# Patient Record
Sex: Female | Born: 1955 | Race: Black or African American | Hispanic: No | Smoking: Former smoker
Health system: Southern US, Community
[De-identification: ages and names within clinical notes are randomized; demographics above are authoritative.]

## PROBLEM LIST (undated history)

## (undated) DIAGNOSIS — I82409 Acute embolism and thrombosis of unspecified deep veins of unspecified lower extremity: Secondary | ICD-10-CM

## (undated) DIAGNOSIS — D509 Iron deficiency anemia, unspecified: Secondary | ICD-10-CM

## (undated) DIAGNOSIS — Z972 Presence of dental prosthetic device (complete) (partial): Secondary | ICD-10-CM

## (undated) DIAGNOSIS — R011 Cardiac murmur, unspecified: Secondary | ICD-10-CM

## (undated) DIAGNOSIS — M1990 Unspecified osteoarthritis, unspecified site: Secondary | ICD-10-CM

## (undated) DIAGNOSIS — K219 Gastro-esophageal reflux disease without esophagitis: Secondary | ICD-10-CM

## (undated) DIAGNOSIS — Q231 Congenital insufficiency of aortic valve: Secondary | ICD-10-CM

## (undated) DIAGNOSIS — Z9889 Other specified postprocedural states: Secondary | ICD-10-CM

## (undated) DIAGNOSIS — I350 Nonrheumatic aortic (valve) stenosis: Secondary | ICD-10-CM

## (undated) DIAGNOSIS — E079 Disorder of thyroid, unspecified: Secondary | ICD-10-CM

## (undated) DIAGNOSIS — I5033 Acute on chronic diastolic (congestive) heart failure: Secondary | ICD-10-CM

## (undated) DIAGNOSIS — Z6841 Body Mass Index (BMI) 40.0 and over, adult: Secondary | ICD-10-CM

## (undated) DIAGNOSIS — G4700 Insomnia, unspecified: Secondary | ICD-10-CM

## (undated) DIAGNOSIS — R112 Nausea with vomiting, unspecified: Secondary | ICD-10-CM

## (undated) DIAGNOSIS — I10 Essential (primary) hypertension: Secondary | ICD-10-CM

## (undated) DIAGNOSIS — Z973 Presence of spectacles and contact lenses: Secondary | ICD-10-CM

## (undated) DIAGNOSIS — Z954 Presence of other heart-valve replacement: Secondary | ICD-10-CM

## (undated) HISTORY — DX: Nonrheumatic aortic (valve) stenosis: I35.0

## (undated) HISTORY — PX: OTHER SURGICAL HISTORY: SHX169

## (undated) HISTORY — PX: MULTIPLE TOOTH EXTRACTIONS: SHX2053

## (undated) HISTORY — PX: CARDIAC CATHETERIZATION: SHX172

## (undated) HISTORY — PX: ABDOMINAL SURGERY: SHX537

## (undated) HISTORY — PX: HERNIA REPAIR: SHX51

---

## 1998-09-19 ENCOUNTER — Emergency Department (HOSPITAL_COMMUNITY): Admission: EM | Admit: 1998-09-19 | Discharge: 1998-09-19 | Payer: Self-pay | Admitting: Emergency Medicine

## 1999-01-02 ENCOUNTER — Encounter: Payer: Self-pay | Admitting: Internal Medicine

## 1999-01-02 ENCOUNTER — Ambulatory Visit (HOSPITAL_COMMUNITY): Admission: RE | Admit: 1999-01-02 | Discharge: 1999-01-02 | Payer: Self-pay | Admitting: Internal Medicine

## 2000-02-22 ENCOUNTER — Inpatient Hospital Stay (HOSPITAL_COMMUNITY): Admission: EM | Admit: 2000-02-22 | Discharge: 2000-03-07 | Payer: Self-pay | Admitting: Emergency Medicine

## 2000-02-22 ENCOUNTER — Encounter: Payer: Self-pay | Admitting: Emergency Medicine

## 2000-02-22 ENCOUNTER — Encounter (HOSPITAL_BASED_OUTPATIENT_CLINIC_OR_DEPARTMENT_OTHER): Payer: Self-pay | Admitting: General Surgery

## 2000-02-22 ENCOUNTER — Encounter (INDEPENDENT_AMBULATORY_CARE_PROVIDER_SITE_OTHER): Payer: Self-pay | Admitting: Specialist

## 2000-02-25 ENCOUNTER — Encounter (HOSPITAL_BASED_OUTPATIENT_CLINIC_OR_DEPARTMENT_OTHER): Payer: Self-pay | Admitting: General Surgery

## 2000-02-28 ENCOUNTER — Encounter (HOSPITAL_BASED_OUTPATIENT_CLINIC_OR_DEPARTMENT_OTHER): Payer: Self-pay | Admitting: General Surgery

## 2000-03-03 ENCOUNTER — Encounter (HOSPITAL_BASED_OUTPATIENT_CLINIC_OR_DEPARTMENT_OTHER): Payer: Self-pay | Admitting: General Surgery

## 2000-03-10 ENCOUNTER — Encounter (HOSPITAL_BASED_OUTPATIENT_CLINIC_OR_DEPARTMENT_OTHER): Payer: Self-pay | Admitting: General Surgery

## 2000-03-10 ENCOUNTER — Inpatient Hospital Stay (HOSPITAL_COMMUNITY): Admission: EM | Admit: 2000-03-10 | Discharge: 2000-04-29 | Payer: Self-pay | Admitting: General Surgery

## 2000-03-13 ENCOUNTER — Encounter (HOSPITAL_BASED_OUTPATIENT_CLINIC_OR_DEPARTMENT_OTHER): Payer: Self-pay | Admitting: General Surgery

## 2000-03-23 ENCOUNTER — Encounter (HOSPITAL_BASED_OUTPATIENT_CLINIC_OR_DEPARTMENT_OTHER): Payer: Self-pay | Admitting: General Surgery

## 2000-03-31 ENCOUNTER — Encounter (HOSPITAL_BASED_OUTPATIENT_CLINIC_OR_DEPARTMENT_OTHER): Payer: Self-pay | Admitting: General Surgery

## 2000-04-01 ENCOUNTER — Encounter (HOSPITAL_BASED_OUTPATIENT_CLINIC_OR_DEPARTMENT_OTHER): Payer: Self-pay | Admitting: General Surgery

## 2000-04-19 ENCOUNTER — Encounter (HOSPITAL_BASED_OUTPATIENT_CLINIC_OR_DEPARTMENT_OTHER): Payer: Self-pay | Admitting: General Surgery

## 2000-04-21 ENCOUNTER — Encounter (HOSPITAL_BASED_OUTPATIENT_CLINIC_OR_DEPARTMENT_OTHER): Payer: Self-pay | Admitting: General Surgery

## 2000-05-13 ENCOUNTER — Encounter (HOSPITAL_BASED_OUTPATIENT_CLINIC_OR_DEPARTMENT_OTHER): Payer: Self-pay | Admitting: General Surgery

## 2000-05-13 ENCOUNTER — Ambulatory Visit (HOSPITAL_COMMUNITY): Admission: RE | Admit: 2000-05-13 | Discharge: 2000-05-13 | Payer: Self-pay | Admitting: General Surgery

## 2000-05-15 ENCOUNTER — Ambulatory Visit (HOSPITAL_COMMUNITY): Admission: RE | Admit: 2000-05-15 | Discharge: 2000-05-15 | Payer: Self-pay | Admitting: General Surgery

## 2000-05-15 ENCOUNTER — Encounter (HOSPITAL_BASED_OUTPATIENT_CLINIC_OR_DEPARTMENT_OTHER): Payer: Self-pay | Admitting: General Surgery

## 2000-05-22 ENCOUNTER — Inpatient Hospital Stay (HOSPITAL_COMMUNITY): Admission: EM | Admit: 2000-05-22 | Discharge: 2000-06-28 | Payer: Self-pay | Admitting: Emergency Medicine

## 2000-05-22 ENCOUNTER — Encounter (HOSPITAL_BASED_OUTPATIENT_CLINIC_OR_DEPARTMENT_OTHER): Payer: Self-pay | Admitting: General Surgery

## 2000-05-27 ENCOUNTER — Encounter (HOSPITAL_BASED_OUTPATIENT_CLINIC_OR_DEPARTMENT_OTHER): Payer: Self-pay | Admitting: General Surgery

## 2000-06-08 ENCOUNTER — Encounter (HOSPITAL_BASED_OUTPATIENT_CLINIC_OR_DEPARTMENT_OTHER): Payer: Self-pay | Admitting: General Surgery

## 2000-06-11 ENCOUNTER — Encounter (HOSPITAL_BASED_OUTPATIENT_CLINIC_OR_DEPARTMENT_OTHER): Payer: Self-pay | Admitting: General Surgery

## 2000-06-17 ENCOUNTER — Encounter (HOSPITAL_BASED_OUTPATIENT_CLINIC_OR_DEPARTMENT_OTHER): Payer: Self-pay | Admitting: General Surgery

## 2000-06-19 ENCOUNTER — Encounter (HOSPITAL_BASED_OUTPATIENT_CLINIC_OR_DEPARTMENT_OTHER): Payer: Self-pay | Admitting: General Surgery

## 2000-06-22 ENCOUNTER — Encounter (HOSPITAL_BASED_OUTPATIENT_CLINIC_OR_DEPARTMENT_OTHER): Payer: Self-pay | Admitting: General Surgery

## 2000-12-24 ENCOUNTER — Ambulatory Visit (HOSPITAL_COMMUNITY): Admission: RE | Admit: 2000-12-24 | Discharge: 2000-12-24 | Payer: Self-pay | Admitting: General Surgery

## 2000-12-24 ENCOUNTER — Encounter (HOSPITAL_BASED_OUTPATIENT_CLINIC_OR_DEPARTMENT_OTHER): Payer: Self-pay | Admitting: General Surgery

## 2000-12-25 ENCOUNTER — Encounter (HOSPITAL_BASED_OUTPATIENT_CLINIC_OR_DEPARTMENT_OTHER): Payer: Self-pay | Admitting: General Surgery

## 2000-12-25 ENCOUNTER — Ambulatory Visit (HOSPITAL_COMMUNITY): Admission: RE | Admit: 2000-12-25 | Discharge: 2000-12-25 | Payer: Self-pay | Admitting: General Surgery

## 2000-12-28 ENCOUNTER — Ambulatory Visit (HOSPITAL_COMMUNITY): Admission: RE | Admit: 2000-12-28 | Discharge: 2000-12-28 | Payer: Self-pay | Admitting: General Surgery

## 2000-12-28 ENCOUNTER — Encounter (HOSPITAL_BASED_OUTPATIENT_CLINIC_OR_DEPARTMENT_OTHER): Payer: Self-pay | Admitting: General Surgery

## 2000-12-29 ENCOUNTER — Encounter (HOSPITAL_BASED_OUTPATIENT_CLINIC_OR_DEPARTMENT_OTHER): Payer: Self-pay | Admitting: General Surgery

## 2000-12-29 ENCOUNTER — Encounter (INDEPENDENT_AMBULATORY_CARE_PROVIDER_SITE_OTHER): Payer: Self-pay | Admitting: *Deleted

## 2000-12-29 ENCOUNTER — Inpatient Hospital Stay (HOSPITAL_COMMUNITY): Admission: RE | Admit: 2000-12-29 | Discharge: 2001-01-10 | Payer: Self-pay | Admitting: General Surgery

## 2000-12-30 ENCOUNTER — Encounter (HOSPITAL_BASED_OUTPATIENT_CLINIC_OR_DEPARTMENT_OTHER): Payer: Self-pay | Admitting: General Surgery

## 2003-04-07 ENCOUNTER — Emergency Department (HOSPITAL_COMMUNITY): Admission: EM | Admit: 2003-04-07 | Discharge: 2003-04-08 | Payer: Self-pay | Admitting: Emergency Medicine

## 2008-11-11 ENCOUNTER — Emergency Department (HOSPITAL_COMMUNITY): Admission: EM | Admit: 2008-11-11 | Discharge: 2008-11-11 | Payer: Self-pay | Admitting: Emergency Medicine

## 2009-09-05 ENCOUNTER — Inpatient Hospital Stay (HOSPITAL_COMMUNITY): Admission: EM | Admit: 2009-09-05 | Discharge: 2009-09-21 | Payer: Self-pay | Admitting: Emergency Medicine

## 2009-09-05 ENCOUNTER — Ambulatory Visit: Payer: Self-pay | Admitting: Cardiology

## 2009-09-05 ENCOUNTER — Ambulatory Visit: Payer: Self-pay | Admitting: Family Medicine

## 2009-09-05 ENCOUNTER — Emergency Department (HOSPITAL_COMMUNITY): Admission: EM | Admit: 2009-09-05 | Discharge: 2009-09-05 | Payer: Self-pay | Admitting: Family Medicine

## 2009-09-06 ENCOUNTER — Encounter: Payer: Self-pay | Admitting: Family Medicine

## 2009-09-06 ENCOUNTER — Ambulatory Visit: Payer: Self-pay | Admitting: Surgery

## 2009-09-09 ENCOUNTER — Ambulatory Visit: Payer: Self-pay | Admitting: Infectious Diseases

## 2009-09-19 ENCOUNTER — Ambulatory Visit: Payer: Self-pay | Admitting: Physical Medicine & Rehabilitation

## 2009-09-21 ENCOUNTER — Inpatient Hospital Stay (HOSPITAL_COMMUNITY)
Admission: RE | Admit: 2009-09-21 | Discharge: 2009-09-28 | Payer: Self-pay | Admitting: Physical Medicine & Rehabilitation

## 2009-09-21 ENCOUNTER — Ambulatory Visit: Payer: Self-pay | Admitting: Physical Medicine & Rehabilitation

## 2009-09-24 DIAGNOSIS — I359 Nonrheumatic aortic valve disorder, unspecified: Secondary | ICD-10-CM

## 2009-09-24 DIAGNOSIS — L02419 Cutaneous abscess of limb, unspecified: Secondary | ICD-10-CM

## 2009-09-24 DIAGNOSIS — E039 Hypothyroidism, unspecified: Secondary | ICD-10-CM

## 2009-09-24 DIAGNOSIS — L03119 Cellulitis of unspecified part of limb: Secondary | ICD-10-CM

## 2009-09-24 DIAGNOSIS — I10 Essential (primary) hypertension: Secondary | ICD-10-CM | POA: Insufficient documentation

## 2009-09-24 DIAGNOSIS — I82503 Chronic embolism and thrombosis of unspecified deep veins of lower extremity, bilateral: Secondary | ICD-10-CM

## 2009-09-28 ENCOUNTER — Encounter (INDEPENDENT_AMBULATORY_CARE_PROVIDER_SITE_OTHER): Payer: Self-pay | Admitting: *Deleted

## 2009-09-30 ENCOUNTER — Encounter: Payer: Self-pay | Admitting: Infectious Diseases

## 2009-10-02 ENCOUNTER — Encounter (INDEPENDENT_AMBULATORY_CARE_PROVIDER_SITE_OTHER): Payer: Self-pay | Admitting: *Deleted

## 2009-10-09 ENCOUNTER — Ambulatory Visit: Payer: Self-pay | Admitting: Infectious Diseases

## 2009-10-09 LAB — CONVERTED CEMR LAB
Basophils Absolute: 0 10*3/uL (ref 0.0–0.1)
Basophils Relative: 0 % (ref 0–1)
CRP: 2 mg/dL — ABNORMAL HIGH (ref <0.6)
HCT: 38 % (ref 36.0–46.0)
Hemoglobin: 12.6 g/dL (ref 12.0–15.0)
MCV: 98.4 fL (ref >=78.0)
Monocytes Absolute: 0.7 10*3/uL (ref 0.1–1.0)
Monocytes Relative: 8 % (ref 3–12)

## 2009-10-15 ENCOUNTER — Encounter (HOSPITAL_BASED_OUTPATIENT_CLINIC_OR_DEPARTMENT_OTHER)
Admission: RE | Admit: 2009-10-15 | Discharge: 2010-01-13 | Payer: Self-pay | Source: Home / Self Care | Admitting: General Surgery

## 2009-10-15 ENCOUNTER — Telehealth: Payer: Self-pay | Admitting: Infectious Diseases

## 2009-10-23 ENCOUNTER — Ambulatory Visit: Payer: Self-pay | Admitting: Vascular Surgery

## 2010-06-22 ENCOUNTER — Ambulatory Visit: Payer: Self-pay | Admitting: Cardiovascular Disease

## 2010-06-22 ENCOUNTER — Inpatient Hospital Stay (HOSPITAL_COMMUNITY)
Admission: EM | Admit: 2010-06-22 | Discharge: 2010-06-24 | Payer: Self-pay | Source: Home / Self Care | Admitting: Emergency Medicine

## 2010-06-23 ENCOUNTER — Encounter: Payer: Self-pay | Admitting: Internal Medicine

## 2010-07-17 ENCOUNTER — Inpatient Hospital Stay (HOSPITAL_COMMUNITY)
Admission: EM | Admit: 2010-07-17 | Discharge: 2010-07-20 | Payer: Self-pay | Source: Home / Self Care | Attending: Internal Medicine | Admitting: Internal Medicine

## 2010-07-18 ENCOUNTER — Ambulatory Visit: Payer: Self-pay | Admitting: Cardiology

## 2010-08-25 ENCOUNTER — Observation Stay (HOSPITAL_COMMUNITY)
Admission: EM | Admit: 2010-08-25 | Discharge: 2010-08-25 | Payer: Self-pay | Source: Home / Self Care | Admitting: Emergency Medicine

## 2010-08-25 LAB — DIFFERENTIAL
Basophils Relative: 0 % (ref 0–1)
Eosinophils Absolute: 0.3 10*3/uL (ref 0.0–0.7)
Eosinophils Relative: 2 % (ref 0–5)
Lymphs Abs: 2.1 10*3/uL (ref 0.7–4.0)
Monocytes Relative: 5 % (ref 3–12)
Neutro Abs: 12.4 10*3/uL — ABNORMAL HIGH (ref 1.7–7.7)

## 2010-08-25 LAB — POCT I-STAT, CHEM 8
BUN: 15 mg/dL (ref 6–23)
Chloride: 106 mEq/L (ref 96–112)
Creatinine, Ser: 0.9 mg/dL (ref 0.4–1.2)
Glucose, Bld: 120 mg/dL — ABNORMAL HIGH (ref 70–99)
HCT: 50 % — ABNORMAL HIGH (ref 36.0–46.0)
Potassium: 4.3 mEq/L (ref 3.5–5.1)
Sodium: 141 mEq/L (ref 135–145)

## 2010-08-25 LAB — CBC
Hemoglobin: 15.3 g/dL — ABNORMAL HIGH (ref 12.0–15.0)
WBC: 15.7 10*3/uL — ABNORMAL HIGH (ref 4.0–10.5)

## 2010-08-27 NOTE — Progress Notes (Signed)
Summary: patient unable to buy rx/TY  Phone Note From Pharmacy   Caller: [facility] Summary of Call: Patient could not afford the medication that was rx'ed for her wound she continued hydrogel  and has an appt. tomorrow at the wound care center. Initial call taken by: Starleen Arms CMA,  October 15, 2009 4:30 PM

## 2010-08-27 NOTE — Miscellaneous (Signed)
Summary: HIPPA RESTRICTION  HIPPA RESTRICTION   Imported By: Florinda Marker 10/09/2009 14:11:45  _____________________________________________________________________  External Attachment:    Type:   Image     Comment:   External Document

## 2010-08-27 NOTE — Miscellaneous (Signed)
Summary: med upddate  Clinical Lists Changes  Medications: Removed medication of VANCOCIN HCL 125 MG CAPS (VANCOMYCIN HCL) Added new medication of CLEOCIN 150 MG CAPS (CLINDAMYCIN HCL) 3 caps four times daily until March 15 Added new medication of POTASSIUM CHLORIDE CRYS CR 20 MEQ CR-TABS (POTASSIUM CHLORIDE CRYS CR) 1 tab once daily Added new medication of OXYCODONE HCL 5 MG TABS (OXYCODONE HCL) 1-2 tabs every 4 hours as needed

## 2010-08-27 NOTE — Assessment & Plan Note (Signed)
Summary: hsfu need chart cellulitis/kam   CC:  hsfu.  History of Present Illness: 55 yo AAF I saw in hospital with severe cellulitis and development of chronic venous stasis ulcer  d.ced from rehab (09/28/2009) and is getting PT at home.   Had I and D at bedside by surgery prior to d/c. Following with wound care next week on the 23rd.   Currently applygin hydrogel to leg - per pt the wound looks a little better- no drainage.  Changing the dressing once a day  Tolerating clindaymycin at this point but using liquid form as pill form felt like it was sticking. No feves chills NS  Jovita Gamma - PCP  Surgery follow up - none wound care  March 23rd - Dr Lurene Shadow.  Consult note in rehab  very pleasant 55 year old female with a   history of hypothyroidism, hypertension, prior DVT who was admitted on   September 05, 2009, with fevers, chills, and swelling in her legs.  She   was found to have an impressive marked cellulitis of her right lower   extremity.  She has been hospitalized since then and on the 25th was   transferred to acute rehab.  For her cellulitis, she has been treated   since admission with vancomycin and it is now day 20 of vancomycin.  She   at other points has also been on clindamycin at times when the wound   would worsen.  She received approximately 10 days of clindamycin with   the last dose being September 17, 2009.  She has also been followed by   Cardiology and is found to have severe aortic stenosis.  Her wound has   been very slow to heal, but has been making progress with evaluation   continued care by Wound Care as well as evaluation by Surgery.  She has   had workup including CT of her lower extremity done both on the 10th and   the 18th.  There has been no deep abscess to drain; however, she did   have blistering of her skin.  Her wound has slowly started to improve   and we are re-consulted for question of whether to change her to oral   antibiotics.      Of  note, the patient has had blood cultures done on the 9th that were   negative as well as a wound culture on the 13th that was negative.   Preventive Screening-Counseling & Management  Alcohol-Tobacco     Alcohol drinks/day: 0     Smoking Status: never  Caffeine-Diet-Exercise     Caffeine use/day: coffee     Does Patient Exercise: yes     Type of exercise: Rehab  Safety-Violence-Falls     Seat Belt Use: yes   Prior Medication List:  PERCOCET 5-325 MG TABS (OXYCODONE-ACETAMINOPHEN) 1-2 tabs q4 hour as needed IBUPROFEN 600 MG TABS (IBUPROFEN) 1 tab as needed PROTONIX 40 MG TBEC (PANTOPRAZOLE SODIUM) 1 tab once daily FUROSEMIDE 20 MG TABS (FUROSEMIDE) 1 tab two times a day LOTENSIN 20 MG TABS (BENAZEPRIL HCL) 1 tab once daily COLACE 100 MG CAPS (DOCUSATE SODIUM) 1 cap two times a day SYNTHROID 175 MCG TABS (LEVOTHYROXINE SODIUM) 1 tab once daily COUMADIN 5 MG TABS (WARFARIN SODIUM) take as directed AMBIEN 10 MG TABS (ZOLPIDEM TARTRATE) 1 tab at bedtime DULCOLAX 5 MG TBEC (BISACODYL) as needed DILAUDID 1 MG/ML SOLN (HYDROMORPHONE HCL) 1mg  q4hour as needed MIRALAX  POWD (POLYETHYLENE GLYCOL 3350) as needed SENNA 8.6  MG TABS (SENNOSIDES) 2 tabs once daily as needed CLEOCIN 150 MG CAPS (CLINDAMYCIN HCL) 3 caps four times daily until March 15 POTASSIUM CHLORIDE CRYS CR 20 MEQ CR-TABS (POTASSIUM CHLORIDE CRYS CR) 1 tab once daily OXYCODONE HCL 5 MG TABS (OXYCODONE HCL) 1-2 tabs every 4 hours as needed   Current Allergies (reviewed today): No known allergies  Past History:  Past Medical History: Last updated: 09/24/2009 COUMADIN THERAPY (ICD-V58.61) AORTIC STENOSIS, SEVERE (ICD-424.1) HYPERTENSION (ICD-401.9) DEEP VENOUS THROMBOPHLEBITIS, LEG, LEFT (ICD-453.40) CELLULITIS, LEG, RIGHT (ICD-682.6) HYPOTHYROIDISM (ICD-244.9)  Past Surgical History: Last updated: 09/24/2009  1. Hernia repair, ventral x2.   2. Colocutaneous fistula as a complication repaired.      Family  History: Last updated: 09/24/2009  Positive CAD.   Social History: Last updated: 09/24/2009  Lives with husband and son.  The patient on disability.   Husband and local family works, one-level home, two steps to enter.      Risk Factors: Alcohol Use: 0 (10/09/2009) Caffeine Use: coffee (10/09/2009) Exercise: yes (10/09/2009)  Risk Factors: Smoking Status: never (10/09/2009)  Review of Systems       11 systems reviewed and negative except per HPI   Vital Signs:  Patient profile:   55 year old female Temp:     97.6 degrees F oral CC: hsfu Pain Assessment Patient in pain? yes     Location: right leg Intensity: 5 Type: throbbing Onset of pain  Constant Nutritional Status Detail appetite is getting better  Does patient need assistance? Functional Status Self care Ambulation Wheelchair   Physical Exam  General:  alert and well-developed.  obese Head:  normocephalic.   Mouth:  fair dentition.   Neck:  supple.   Lungs:  normal respiratory effort and normal breath sounds.   Heart:  normal rate and regular rhythm.   Abdomen:  soft and non-tender.   Msk:  Large amt of woody non pitting edema over R leg.   Skin:  rle with 6 cm x 4 cm very irregular wound about 1 cm deep.   the base has about 70% yellow tissue and about 30 % red tissue. The surrounding skin is raised and plaque like. chronic venous changes ALso LLE with chronic venous changes Psych:  Oriented X3, memory intact for recent and remote, and normally interactive.     Impression & Recommendations:  Problem # 1:  CELLULITIS, LEG, RIGHT (ICD-682.6) 55 yo with severe le cellulitis and chronic venous stasis ulcer seen in f/u.  On clindamycin since d/c.   Chronic venous stasis ulcer after a severe case of cellulits.  Leg seems stable and I doubt active infection but the stasis ulcer needs more aggressive woudn care.  Currently using hydrogel on wound.  At this point I Think we should do santyl or accuzyme WIll  call advanced to do that.  WIll stop abx at this point and advised re need to monitor. She will follow up with wound care next week.  Her updated medication list for this problem includes:    Percocet 5-325 Mg Tabs (Oxycodone-acetaminophen) .Marland Kitchen... 1-2 tabs q4 hour as needed    Dilaudid 1 Mg/ml Soln (Hydromorphone hcl) ..... 1mg  q4hour as needed    Cleocin 150 Mg Caps (Clindamycin hcl) .Marland KitchenMarland KitchenMarland KitchenMarland Kitchen 3 caps four times daily until march 15    Oxycodone Hcl 5 Mg Tabs (Oxycodone hcl) .Marland Kitchen... 1-2 tabs every 4 hours as needed    Roxicodone 5 Mg Tabs (Oxycodone hcl) ..... One by mouth q 6 hours as needed  for pain.  Orders: Est. Patient Level IV (16109) T-C-Reactive Protein [phone]) T-CBC w/Diff [phone]) T-Sed Rate (Automated) ([id number]) T-Protime, Auto ([id number])  Problem # 2:  AORTIC STENOSIS, SEVERE (ICD-424.1) follows with cards Her updated medication list for this problem includes:    Coumadin 5 Mg Tabs (Warfarin sodium) .Marland Kitchen... Take as directed  Medications Added to Medication List This Visit: 1)  Roxicodone 5 Mg Tabs (Oxycodone hcl) .... One by mouth q 6 hours as needed for pain. 2)  Santyl 250 Unit/gm Oint (Collagenase) .... Apply to wound two times a day  Patient Instructions: 1)  Please schedule a follow-up appointment in 1 month. 2)  Follow up with Dr Lurene Shadow in wound care. 3)  We will change the wound dressing from hydrogel to santyl two times a day. 4)  Stop the antibiotics and monitor for worsening pain, redness or draiange from the wound or fevers chills or other infectious symptoms. Prescriptions: SANTYL 250 UNIT/GM OINT (COLLAGENASE) apply to wound two times a day  #1 x 6   Entered and Authorized by:   Clydie Braun MD   Signed by:   Clydie Braun MD on 10/11/2009   Method used:   Telephoned to ...         RxID:   [id number] ROXICODONE 5 MG TABS (OXYCODONE HCL) one by mouth q 6 hours as needed for pain.  #60 x 0   Entered and Authorized by:   Clydie Braun MD   Signed by:   Clydie Braun MD on 10/09/2009   Method used:   Print then Give to Patient   RxID:   [id number]  Process Orders Check Orders Results:     [facility]: ABN not required for this insurance Tests Sent for requisitioning (October 11, 2009 4:36 PM):     10/09/2009: [facility] -- T-C-Reactive Protein [[id number]] (signed)     10/09/2009: [facility] -- T-CBC w/Diff [[id number]] (signed)     10/09/2009: [facility] -- T-Sed Rate (Automated) [phone] (signed)     10/09/2009: [facility] -- T-Protime, Auto [[id number]] (signed)  Santyl phoned to [facility] @ [phone] [facility] RN  October 11, 2009 9:43 AM

## 2010-08-27 NOTE — Letter (Signed)
Summary: Appointment - Missed  [facility]     [place], [place]    Phone:   Fax:      October 02, 2009 MRN: [id number]   Christine Hicks [address] [place], [place]  [address]   Dear Christine Hicks,  Our records indicate you missed your appointment on 09-28-2009   with  Dr. Daleen Squibb    It is very important that we reach you to reschedule this appointment. We look forward to participating in your health care needs. Please contact us at the number listed above at your earliest convenience to reschedule this appointment.     Sincerely,   Lorne Skeens  [facility] Scheduling Team

## 2010-08-27 NOTE — Letter (Signed)
Summary: PHYSICIAN ORDERS  PHYSICIAN ORDERS   Imported By: Margie Billet 10/12/2009 15:24:10  _____________________________________________________________________  External Attachment:    Type:   Image     Comment:   External Document

## 2010-10-07 LAB — CBC
HCT: 46.9 % — ABNORMAL HIGH (ref 36.0–46.0)
Hemoglobin: 14.2 g/dL (ref 12.0–15.0)
Hemoglobin: 14.6 g/dL (ref 12.0–15.0)
Hemoglobin: 15 g/dL (ref 12.0–15.0)
Hemoglobin: 15.6 g/dL — ABNORMAL HIGH (ref 12.0–15.0)
MCH: 31.4 pg (ref 26.0–34.0)
MCH: 31.5 pg (ref 26.0–34.0)
MCHC: 32.8 g/dL (ref 30.0–36.0)
MCV: 94.7 fL (ref 78.0–100.0)
MCV: 95.3 fL (ref 78.0–100.0)
MCV: 95.8 fL (ref 78.0–100.0)
MCV: 96 fL (ref 78.0–100.0)
Platelets: 163 10*3/uL (ref 150–400)
Platelets: 181 10*3/uL (ref 150–400)
Platelets: ADEQUATE 10*3/uL (ref 150–400)
RBC: 4.51 MIL/uL (ref 3.87–5.11)
RBC: 4.73 MIL/uL (ref 3.87–5.11)
RDW: 14.2 % (ref 11.5–15.5)
RDW: 14.3 % (ref 11.5–15.5)
RDW: 14.5 % (ref 11.5–15.5)

## 2010-10-07 LAB — CULTURE, BLOOD (ROUTINE X 2)
Culture  Setup Time: 201112220527
Culture: NO GROWTH

## 2010-10-07 LAB — COMPREHENSIVE METABOLIC PANEL
ALT: 20 U/L (ref 0–35)
AST: 26 U/L (ref 0–37)
Albumin: 3.2 g/dL — ABNORMAL LOW (ref 3.5–5.2)
Alkaline Phosphatase: 63 U/L (ref 39–117)
Calcium: 8.9 mg/dL (ref 8.4–10.5)
Chloride: 108 mEq/L (ref 96–112)
GFR calc non Af Amer: >60 mL/min (ref >60)
Sodium: 140 mEq/L (ref 135–145)
Total Bilirubin: 1.2 mg/dL (ref 0.3–1.2)
Total Protein: 7.5 g/dL (ref 6.0–8.3)

## 2010-10-07 LAB — BASIC METABOLIC PANEL
BUN: 8 mg/dL (ref 6–23)
CO2: 25 mEq/L (ref 19–32)
CO2: 25 mEq/L (ref 19–32)
Chloride: 104 mEq/L (ref 96–112)
Creatinine, Ser: 0.78 mg/dL (ref 0.4–1.2)
GFR calc non Af Amer: >60 mL/min (ref >60)
GFR calc non Af Amer: >60 mL/min (ref >60)
Glucose, Bld: 114 mg/dL — ABNORMAL HIGH (ref 70–99)
Glucose, Bld: 118 mg/dL — ABNORMAL HIGH (ref 70–99)
Glucose, Bld: 96 mg/dL (ref 70–99)
Potassium: 3.9 mEq/L (ref 3.5–5.1)
Potassium: 4.1 mEq/L (ref 3.5–5.1)

## 2010-10-07 LAB — DIFFERENTIAL
Basophils Absolute: 0 10*3/uL (ref 0.0–0.1)
Basophils Absolute: 0 10*3/uL (ref 0.0–0.1)
Basophils Relative: 0 % (ref 0–1)
Basophils Relative: 0 % (ref 0–1)
Eosinophils Absolute: 0.4 10*3/uL (ref 0.0–0.7)
Eosinophils Relative: 1 % (ref 0–5)
Eosinophils Relative: 6 % — ABNORMAL HIGH (ref 0–5)
Lymphocytes Relative: 11 % — ABNORMAL LOW (ref 12–46)
Lymphocytes Relative: 15 % (ref 12–46)
Monocytes Absolute: 0.8 10*3/uL (ref 0.1–1.0)
Monocytes Relative: 7 % (ref 3–12)

## 2010-10-07 LAB — VANCOMYCIN, TROUGH: Vancomycin Tr: 18.6 ug/mL (ref 10.0–20.0)

## 2010-10-08 LAB — CULTURE, BLOOD (ROUTINE X 2)
Culture  Setup Time: 201111270022
Culture: NO GROWTH
Culture: NO GROWTH

## 2010-10-08 LAB — HEPATIC FUNCTION PANEL
ALT: 23 U/L (ref 0–35)
AST: 39 U/L — ABNORMAL HIGH (ref 0–37)
Albumin: 3.4 g/dL — ABNORMAL LOW (ref 3.5–5.2)
Total Bilirubin: 0.8 mg/dL (ref 0.3–1.2)

## 2010-10-08 LAB — BASIC METABOLIC PANEL
BUN: 8 mg/dL (ref 6–23)
CO2: 23 mEq/L (ref 19–32)
CO2: 24 mEq/L (ref 19–32)
Calcium: 8.8 mg/dL (ref 8.4–10.5)
Chloride: 103 mEq/L (ref 96–112)
Chloride: 111 mEq/L (ref 96–112)
Creatinine, Ser: 0.63 mg/dL (ref 0.4–1.2)
Creatinine, Ser: 0.83 mg/dL (ref 0.4–1.2)
GFR calc Af Amer: >60 mL/min (ref >60)
GFR calc Af Amer: >60 mL/min (ref >60)
Glucose, Bld: 109 mg/dL — ABNORMAL HIGH (ref 70–99)

## 2010-10-08 LAB — URINALYSIS, ROUTINE W REFLEX MICROSCOPIC
Ketones, ur: NEGATIVE mg/dL
Nitrite: NEGATIVE
Protein, ur: NEGATIVE mg/dL
Urobilinogen, UA: 0.2 mg/dL (ref 0.0–1.0)

## 2010-10-08 LAB — CBC
Hemoglobin: 13.5 g/dL (ref 12.0–15.0)
MCH: 30.7 pg (ref 26.0–34.0)
MCH: 31.7 pg (ref 26.0–34.0)
MCHC: 32.8 g/dL (ref 30.0–36.0)
MCV: 93.4 fL (ref 78.0–100.0)
MCV: 94.2 fL (ref 78.0–100.0)
Platelets: 185 10*3/uL (ref 150–400)
Platelets: 192 10*3/uL (ref 150–400)
RBC: 4.4 MIL/uL (ref 3.87–5.11)
RBC: 4.96 MIL/uL (ref 3.87–5.11)
RDW: 14 % (ref 11.5–15.5)

## 2010-10-08 LAB — URINE CULTURE: Colony Count: 45000

## 2010-10-08 LAB — DIFFERENTIAL
Basophils Absolute: 0 10*3/uL (ref 0.0–0.1)
Basophils Relative: 0 % (ref 0–1)
Eosinophils Absolute: 0.2 10*3/uL (ref 0.0–0.7)
Eosinophils Relative: 2 % (ref 0–5)
Lymphs Abs: 1.4 10*3/uL (ref 0.7–4.0)
Neutrophils Relative %: 83 % — ABNORMAL HIGH (ref 43–77)

## 2010-10-16 LAB — BASIC METABOLIC PANEL
BUN: 10 mg/dL (ref 6–23)
BUN: 4 mg/dL — ABNORMAL LOW (ref 6–23)
BUN: 5 mg/dL — ABNORMAL LOW (ref 6–23)
BUN: 5 mg/dL — ABNORMAL LOW (ref 6–23)
BUN: 6 mg/dL (ref 6–23)
BUN: 6 mg/dL (ref 6–23)
BUN: 6 mg/dL (ref 6–23)
BUN: 6 mg/dL (ref 6–23)
BUN: 7 mg/dL (ref 6–23)
BUN: 8 mg/dL (ref 6–23)
BUN: 9 mg/dL (ref 6–23)
CO2: 20 mEq/L (ref 19–32)
CO2: 21 mEq/L (ref 19–32)
CO2: 22 mEq/L (ref 19–32)
CO2: 23 mEq/L (ref 19–32)
CO2: 25 mEq/L (ref 19–32)
CO2: 25 mEq/L (ref 19–32)
CO2: 26 mEq/L (ref 19–32)
CO2: 28 mEq/L (ref 19–32)
CO2: 28 mEq/L (ref 19–32)
CO2: 28 mEq/L (ref 19–32)
Calcium: 7.9 mg/dL — ABNORMAL LOW (ref 8.4–10.5)
Calcium: 7.9 mg/dL — ABNORMAL LOW (ref 8.4–10.5)
Calcium: 8.1 mg/dL — ABNORMAL LOW (ref 8.4–10.5)
Calcium: 8.1 mg/dL — ABNORMAL LOW (ref 8.4–10.5)
Calcium: 8.1 mg/dL — ABNORMAL LOW (ref 8.4–10.5)
Chloride: 101 mEq/L (ref 96–112)
Chloride: 106 mEq/L (ref 96–112)
Chloride: 107 mEq/L (ref 96–112)
Chloride: 108 mEq/L (ref 96–112)
Chloride: 108 mEq/L (ref 96–112)
Chloride: 108 mEq/L (ref 96–112)
Chloride: 108 mEq/L (ref 96–112)
Chloride: 99 mEq/L (ref 96–112)
Creatinine, Ser: 0.5 mg/dL (ref 0.4–1.2)
Creatinine, Ser: 0.51 mg/dL (ref 0.4–1.2)
Creatinine, Ser: 0.51 mg/dL (ref 0.4–1.2)
Creatinine, Ser: 0.51 mg/dL (ref 0.4–1.2)
Creatinine, Ser: 0.52 mg/dL (ref 0.4–1.2)
Creatinine, Ser: 0.58 mg/dL (ref 0.4–1.2)
Creatinine, Ser: 0.6 mg/dL (ref 0.4–1.2)
Creatinine, Ser: 0.63 mg/dL (ref 0.4–1.2)
Creatinine, Ser: 0.69 mg/dL (ref 0.4–1.2)
Creatinine, Ser: 0.79 mg/dL (ref 0.4–1.2)
Creatinine, Ser: 0.89 mg/dL (ref 0.4–1.2)
GFR calc Af Amer: >60 mL/min (ref >60)
GFR calc Af Amer: >60 mL/min (ref >60)
GFR calc Af Amer: >60 mL/min (ref >60)
GFR calc Af Amer: >60 mL/min (ref >60)
GFR calc Af Amer: >60 mL/min (ref >60)
GFR calc Af Amer: >60 mL/min (ref >60)
GFR calc Af Amer: >60 mL/min (ref >60)
GFR calc non Af Amer: >60 mL/min (ref >60)
GFR calc non Af Amer: >60 mL/min (ref >60)
GFR calc non Af Amer: >60 mL/min (ref >60)
GFR calc non Af Amer: >60 mL/min (ref >60)
GFR calc non Af Amer: >60 mL/min (ref >60)
GFR calc non Af Amer: >60 mL/min (ref >60)
GFR calc non Af Amer: >60 mL/min (ref >60)
GFR calc non Af Amer: >60 mL/min (ref >60)
Glucose, Bld: 106 mg/dL — ABNORMAL HIGH (ref 70–99)
Glucose, Bld: 113 mg/dL — ABNORMAL HIGH (ref 70–99)
Glucose, Bld: 84 mg/dL (ref 70–99)
Glucose, Bld: 84 mg/dL (ref 70–99)
Glucose, Bld: 88 mg/dL (ref 70–99)
Glucose, Bld: 90 mg/dL (ref 70–99)
Glucose, Bld: 90 mg/dL (ref 70–99)
Glucose, Bld: 95 mg/dL (ref 70–99)
Glucose, Bld: 96 mg/dL (ref 70–99)
Glucose, Bld: 97 mg/dL (ref 70–99)
Glucose, Bld: 99 mg/dL (ref 70–99)
Potassium: 3 mEq/L — ABNORMAL LOW (ref 3.5–5.1)
Potassium: 3.2 mEq/L — ABNORMAL LOW (ref 3.5–5.1)
Potassium: 3.4 mEq/L — ABNORMAL LOW (ref 3.5–5.1)
Potassium: 3.4 mEq/L — ABNORMAL LOW (ref 3.5–5.1)
Potassium: 3.5 mEq/L (ref 3.5–5.1)
Potassium: 3.7 mEq/L (ref 3.5–5.1)
Potassium: 3.7 mEq/L (ref 3.5–5.1)
Potassium: 3.9 mEq/L (ref 3.5–5.1)
Potassium: 4 mEq/L (ref 3.5–5.1)
Potassium: 4 mEq/L (ref 3.5–5.1)
Sodium: 135 mEq/L (ref 135–145)
Sodium: 135 mEq/L (ref 135–145)
Sodium: 136 mEq/L (ref 135–145)
Sodium: 137 mEq/L (ref 135–145)

## 2010-10-16 LAB — CBC
HCT: 30.4 % — ABNORMAL LOW (ref 36.0–46.0)
HCT: 32.3 % — ABNORMAL LOW (ref 36.0–46.0)
HCT: 34.2 % — ABNORMAL LOW (ref 36.0–46.0)
HCT: 35.8 % — ABNORMAL LOW (ref 36.0–46.0)
HCT: 36.6 % (ref 36.0–46.0)
HCT: 37.3 % (ref 36.0–46.0)
HCT: 38.5 % (ref 36.0–46.0)
HCT: 39.7 % (ref 36.0–46.0)
Hemoglobin: 11.7 g/dL — ABNORMAL LOW (ref 12.0–15.0)
Hemoglobin: 12.3 g/dL (ref 12.0–15.0)
Hemoglobin: 12.5 g/dL (ref 12.0–15.0)
Hemoglobin: 12.8 g/dL (ref 12.0–15.0)
MCHC: 33.5 g/dL (ref 30.0–36.0)
MCHC: 33.6 g/dL (ref 30.0–36.0)
MCHC: 33.8 g/dL (ref 30.0–36.0)
MCHC: 33.9 g/dL (ref 30.0–36.0)
MCHC: 34 g/dL (ref 30.0–36.0)
MCHC: 34.2 g/dL (ref 30.0–36.0)
MCHC: 34.3 g/dL (ref 30.0–36.0)
MCHC: 34.3 g/dL (ref 30.0–36.0)
MCHC: 34.3 g/dL (ref 30.0–36.0)
MCHC: 34.5 g/dL (ref 30.0–36.0)
MCV: 94.1 fL (ref 78.0–100.0)
MCV: 95.2 fL (ref 78.0–100.0)
MCV: 95.8 fL (ref 78.0–100.0)
MCV: 95.8 fL (ref 78.0–100.0)
MCV: 95.9 fL (ref 78.0–100.0)
MCV: 95.9 fL (ref 78.0–100.0)
MCV: 96 fL (ref 78.0–100.0)
MCV: 96.3 fL (ref 78.0–100.0)
MCV: 96.6 fL (ref 78.0–100.0)
MCV: 96.6 fL (ref 78.0–100.0)
MCV: 96.8 fL (ref 78.0–100.0)
Platelets: 156 10*3/uL (ref 150–400)
Platelets: 165 10*3/uL (ref 150–400)
Platelets: 207 10*3/uL (ref 150–400)
Platelets: 220 10*3/uL (ref 150–400)
Platelets: 329 10*3/uL (ref 150–400)
Platelets: 457 10*3/uL — ABNORMAL HIGH (ref 150–400)
Platelets: 474 10*3/uL — ABNORMAL HIGH (ref 150–400)
Platelets: 476 10*3/uL — ABNORMAL HIGH (ref 150–400)
Platelets: 487 10*3/uL — ABNORMAL HIGH (ref 150–400)
RBC: 3.17 MIL/uL — ABNORMAL LOW (ref 3.87–5.11)
RBC: 3.32 MIL/uL — ABNORMAL LOW (ref 3.87–5.11)
RBC: 3.74 MIL/uL — ABNORMAL LOW (ref 3.87–5.11)
RBC: 3.75 MIL/uL — ABNORMAL LOW (ref 3.87–5.11)
RBC: 3.8 MIL/uL — ABNORMAL LOW (ref 3.87–5.11)
RBC: 3.89 MIL/uL (ref 3.87–5.11)
RBC: 4.01 MIL/uL (ref 3.87–5.11)
RBC: 4.05 MIL/uL (ref 3.87–5.11)
RDW: 14.5 % (ref 11.5–15.5)
RDW: 14.7 % (ref 11.5–15.5)
RDW: 14.7 % (ref 11.5–15.5)
RDW: 14.7 % (ref 11.5–15.5)
RDW: 14.8 % (ref 11.5–15.5)
RDW: 14.8 % (ref 11.5–15.5)
RDW: 14.9 % (ref 11.5–15.5)
RDW: 15 % (ref 11.5–15.5)
RDW: 15.1 % (ref 11.5–15.5)
RDW: 15.3 % (ref 11.5–15.5)
RDW: 15.5 % (ref 11.5–15.5)
WBC: 11.5 10*3/uL — ABNORMAL HIGH (ref 4.0–10.5)
WBC: 15.1 10*3/uL — ABNORMAL HIGH (ref 4.0–10.5)
WBC: 19.8 10*3/uL — ABNORMAL HIGH (ref 4.0–10.5)
WBC: 7 10*3/uL (ref 4.0–10.5)
WBC: 8.5 10*3/uL (ref 4.0–10.5)
WBC: 8.9 10*3/uL (ref 4.0–10.5)

## 2010-10-16 LAB — PROTIME-INR
INR: 1.92 — ABNORMAL HIGH (ref 0.00–1.49)
INR: 2.12 — ABNORMAL HIGH (ref 0.00–1.49)
INR: 2.15 — ABNORMAL HIGH (ref 0.00–1.49)
INR: 2.4 — ABNORMAL HIGH (ref 0.00–1.49)
INR: 2.62 — ABNORMAL HIGH (ref 0.00–1.49)
INR: 2.94 — ABNORMAL HIGH (ref 0.00–1.49)
Prothrombin Time: 15.1 seconds (ref 11.6–15.2)
Prothrombin Time: 21.5 seconds — ABNORMAL HIGH (ref 11.6–15.2)
Prothrombin Time: 21.8 seconds — ABNORMAL HIGH (ref 11.6–15.2)
Prothrombin Time: 22.3 seconds — ABNORMAL HIGH (ref 11.6–15.2)
Prothrombin Time: 23.8 seconds — ABNORMAL HIGH (ref 11.6–15.2)
Prothrombin Time: 24.9 seconds — ABNORMAL HIGH (ref 11.6–15.2)
Prothrombin Time: 26 seconds — ABNORMAL HIGH (ref 11.6–15.2)
Prothrombin Time: 27.8 seconds — ABNORMAL HIGH (ref 11.6–15.2)
Prothrombin Time: 30.4 seconds — ABNORMAL HIGH (ref 11.6–15.2)

## 2010-10-16 LAB — COMPREHENSIVE METABOLIC PANEL
ALT: 11 U/L (ref 0–35)
AST: 15 U/L (ref 0–37)
BUN: 4 mg/dL — ABNORMAL LOW (ref 6–23)
CO2: 26 mEq/L (ref 19–32)
CO2: 28 mEq/L (ref 19–32)
Calcium: 8 mg/dL — ABNORMAL LOW (ref 8.4–10.5)
Calcium: 8.1 mg/dL — ABNORMAL LOW (ref 8.4–10.5)
Chloride: 102 mEq/L (ref 96–112)
Creatinine, Ser: 0.56 mg/dL (ref 0.4–1.2)
GFR calc Af Amer: >60 mL/min (ref >60)
GFR calc Af Amer: >60 mL/min (ref >60)
GFR calc non Af Amer: >60 mL/min (ref >60)
GFR calc non Af Amer: >60 mL/min (ref >60)
Sodium: 134 mEq/L — ABNORMAL LOW (ref 135–145)
Total Bilirubin: 0.8 mg/dL (ref 0.3–1.2)
Total Protein: 7.8 g/dL (ref 6.0–8.3)

## 2010-10-16 LAB — DIFFERENTIAL
Basophils Absolute: 0 10*3/uL (ref 0.0–0.1)
Basophils Absolute: 0 10*3/uL (ref 0.0–0.1)
Eosinophils Absolute: 0 10*3/uL (ref 0.0–0.7)
Lymphocytes Relative: 29 % (ref 12–46)
Lymphs Abs: 1.2 10*3/uL (ref 0.7–4.0)
Lymphs Abs: 2 10*3/uL (ref 0.7–4.0)
Monocytes Absolute: 0.3 10*3/uL (ref 0.1–1.0)
Neutro Abs: 3.9 10*3/uL (ref 1.7–7.7)
Neutrophils Relative %: 56 % (ref 43–77)
WBC Morphology: INCREASED

## 2010-10-16 LAB — CULTURE, BLOOD (ROUTINE X 2)

## 2010-10-16 LAB — WOUND CULTURE: Culture: NO GROWTH

## 2010-10-16 LAB — VANCOMYCIN, TROUGH: Vancomycin Tr: 13.3 ug/mL (ref 10.0–20.0)

## 2010-10-16 LAB — LIPID PANEL: Cholesterol: 124 mg/dL (ref 0–200)

## 2010-10-18 LAB — BASIC METABOLIC PANEL
BUN: 6 mg/dL (ref 6–23)
CO2: 28 mEq/L (ref 19–32)
CO2: 29 mEq/L (ref 19–32)
Chloride: 100 mEq/L (ref 96–112)
Chloride: 100 mEq/L (ref 96–112)
Creatinine, Ser: 0.54 mg/dL (ref 0.4–1.2)
Creatinine, Ser: 0.55 mg/dL (ref 0.4–1.2)
GFR calc Af Amer: >60 mL/min (ref >60)
Glucose, Bld: 106 mg/dL — ABNORMAL HIGH (ref 70–99)
Potassium: 3.4 mEq/L — ABNORMAL LOW (ref 3.5–5.1)
Potassium: 3.5 mEq/L (ref 3.5–5.1)
Sodium: 133 mEq/L — ABNORMAL LOW (ref 135–145)

## 2010-10-18 LAB — PROTIME-INR
INR: 2.24 — ABNORMAL HIGH (ref 0.00–1.49)
INR: 2.49 — ABNORMAL HIGH (ref 0.00–1.49)
INR: 3.12 — ABNORMAL HIGH (ref 0.00–1.49)
Prothrombin Time: 24.6 seconds — ABNORMAL HIGH (ref 11.6–15.2)
Prothrombin Time: 26.7 seconds — ABNORMAL HIGH (ref 11.6–15.2)
Prothrombin Time: 29.5 seconds — ABNORMAL HIGH (ref 11.6–15.2)

## 2010-11-06 LAB — CBC
HCT: 45.7 % (ref 36.0–46.0)
Hemoglobin: 15.3 g/dL — ABNORMAL HIGH (ref 12.0–15.0)
RBC: 4.83 MIL/uL (ref 3.87–5.11)
RDW: 14.7 % (ref 11.5–15.5)
WBC: 7.6 10*3/uL (ref 4.0–10.5)

## 2010-11-06 LAB — DIFFERENTIAL
Basophils Absolute: 0 10*3/uL (ref 0.0–0.1)
Eosinophils Relative: 1 % (ref 0–5)
Lymphocytes Relative: 17 % (ref 12–46)
Lymphs Abs: 1.3 10*3/uL (ref 0.7–4.0)
Monocytes Absolute: 0.3 10*3/uL (ref 0.1–1.0)
Neutro Abs: 5.9 10*3/uL (ref 1.7–7.7)

## 2010-11-06 LAB — POCT I-STAT, CHEM 8
BUN: 9 mg/dL (ref 6–23)
Calcium, Ion: 1.13 mmol/L (ref 1.12–1.32)
Chloride: 106 mEq/L (ref 96–112)
Creatinine, Ser: 0.8 mg/dL (ref 0.4–1.2)

## 2010-11-08 ENCOUNTER — Emergency Department (HOSPITAL_COMMUNITY)
Admission: EM | Admit: 2010-11-08 | Discharge: 2010-11-09 | Disposition: A | Discharge status: Home / Self Care | Payer: BC Managed Care – PPO | Attending: Emergency Medicine | Admitting: Emergency Medicine

## 2010-11-08 DIAGNOSIS — L03119 Cellulitis of unspecified part of limb: Secondary | ICD-10-CM | POA: Insufficient documentation

## 2010-11-08 DIAGNOSIS — E039 Hypothyroidism, unspecified: Secondary | ICD-10-CM | POA: Insufficient documentation

## 2010-11-08 DIAGNOSIS — I10 Essential (primary) hypertension: Secondary | ICD-10-CM | POA: Insufficient documentation

## 2010-11-08 DIAGNOSIS — L02419 Cutaneous abscess of limb, unspecified: Secondary | ICD-10-CM | POA: Insufficient documentation

## 2010-11-08 DIAGNOSIS — R6883 Chills (without fever): Secondary | ICD-10-CM | POA: Insufficient documentation

## 2010-11-08 DIAGNOSIS — E669 Obesity, unspecified: Secondary | ICD-10-CM | POA: Insufficient documentation

## 2010-11-08 DIAGNOSIS — M79609 Pain in unspecified limb: Secondary | ICD-10-CM | POA: Insufficient documentation

## 2010-11-08 DIAGNOSIS — R011 Cardiac murmur, unspecified: Secondary | ICD-10-CM | POA: Insufficient documentation

## 2010-11-08 DIAGNOSIS — Z79899 Other long term (current) drug therapy: Secondary | ICD-10-CM | POA: Insufficient documentation

## 2010-11-09 LAB — CBC
HCT: 45.6 % (ref 36.0–46.0)
Platelets: 219 10*3/uL (ref 150–400)
RDW: 13.8 % (ref 11.5–15.5)
WBC: 13 10*3/uL — ABNORMAL HIGH (ref 4.0–10.5)

## 2010-11-09 LAB — BASIC METABOLIC PANEL
GFR calc Af Amer: >60 mL/min (ref >60)
GFR calc non Af Amer: >60 mL/min (ref >60)
Potassium: 4.1 mEq/L (ref 3.5–5.1)
Sodium: 140 mEq/L (ref 135–145)

## 2010-11-09 LAB — DIFFERENTIAL
Lymphs Abs: 1.8 10*3/uL (ref 0.7–4.0)
Monocytes Absolute: 1 10*3/uL (ref 0.1–1.0)
Monocytes Relative: 8 % (ref 3–12)
Neutro Abs: 9.8 10*3/uL — ABNORMAL HIGH (ref 1.7–7.7)
Neutrophils Relative %: 75 % (ref 43–77)

## 2010-12-13 NOTE — Op Note (Signed)
[facility]  Patient:    Christine Hicks, Christine Hicks                        MRN: [id number] Proc. Date: 04/14/00 Adm. Date:  [id number] Attending:  Fortino Sic                           Operative Report  PREOPERATIVE DIAGNOSIS:  Complex open wound anterior abdominal wall, approximately 30 cm x 15 cm.  POSTOPERATIVE DIAGNOSIS:  Complex open wound anterior abdominal wall, approximately 30 cm x 15 cm.  OPERATION: 1. Excisional preparation of anterior abdominal wall open wound. 2. Split thickness skin graft approximately 350 sq cm.  SURGEON:  Alfredia Ferguson, M.D.  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  Her wound has been treated with local aggressive wound care including Porcine skin grafts time two.  The wound is now well granulated and is ready for split thickness skin graft.  DESCRIPTION OF PROCEDURE:  After adequate general endotracheal anesthesia had been induced, the patients right anterior lateral thigh and abdomen were prepped and draped in a sterile fashion.  Attention was first directed to the abdominal wound. It was noted during the debridement that there was stool coming from the superior edge of the open wound.  Just to the left colostomy. I put a hemostat into this tract which went approximately 5 cm.  I was not able to come into any kind of purulent pocket.  I would image this is another fistula probably connecting to the patients colostomy.  Because the previously placed Porcine skin had taken so well, I opted to proceed with skin graft.  Certainly she was at greater risk for failure.  The wound was now completely debrided by removing approximately 0.5 cm of edge of the wound getting back to fresh cut edge of the open wound.  This was done in a circumferential fashion.  The base of the wound was now  curetted, removing the superficial layer of granulation tissue.  Three liters of saline was used to lavage the wound with pulse irrigator.  Hemostasis was accomplished using cautery.  A moist sponge was placed over the wound and skin was now harvested from the right anterior lateral thigh.  Adequate amount of skin to graft the wound was harvested.  The skin was then meshed 1.5:1.  The donor site was dressed with Scarlet red and Op-Site.  The skin was placed on the recipient site, spread to fill as much of the wound as possible.  The skin was stapled in position.  I was able to get approximately 95% of the wound covered. Certain areas I could not cover because I ran short of skin.  I opted not to harvest any more skin.  Following placement of the skin graft, the dressing was placed.  Because the colostomy bag began to leak during the procedure, the colostomy bag was removed and a new colostomy bag was placed after placement of the dressing.  The patient was awakened, extubated and transported to the recovery room in satisfactory condition. DD:  04/14/00 TD:  04/15/00 Job: [id number]

## 2010-12-13 NOTE — Discharge Summary (Signed)
[facility]  Patient:    Christine Hicks, Christine Hicks                        MRN: [id number] Adm. Date:  [id number] Disc. Date: 04/29/00 Attending:  Fortino Sic CC:         Lindell Spar. Chestine Spore, M.D.  Alfredia Ferguson, M.D.   Discharge Summary  ADMISSION DIAGNOSIS:  Wound infection, status post small-bowel resection and repair of incarcerated ventral hernia.  DISCHARGE DIAGNOSIS:  Wound infection secondary to perforation of colon.  OPERATIONS: 1. March 13, 2000, drainage of intra-abdominal abscess, removal of synthetic    patch, and skin-level colostomy, done by Dr. Wiliam Ke. 2. Debridement and placement of pigskin graft April 03, 2000. 3. Debridement and placement of porcine skin graft April 08, 2000. 4. Split-thickness skin graft April 14, 2000.  HISTORY OF PRESENT ILLNESS:  This 55 year old female is admitted approximately 10 days to two weeks after undergoing repair of incarcerated ventral hernia and placement of a dual-mesh patch.  Admission laboratory work revealed elevated white count.  The patient was febrile upon admission.  HOSPITAL COURSE:  The patient was admitted, IV fluids were started.  She was seen in consultation by Dr. Margaretmary Bayley and by the infectious disease service.  It became obvious that there was a fistula by the second day of admission.  She underwent CT scan, and a very large abscess was found.  The first operation was performed on March 13, 2000.  She was in the intensive care unit for a time and was cared for by the CCM service.  Nutritional support was started intravenously.  Several trips to the operating room were necessary for debridement, for dressing changes, and for skin grafting.  On March 17, 2000, she was transferred back to the floor.  She did relatively well and was starting to eat and was in good spirits.  Skin coverage treatment was started.  She became suddenly febrile on April 19, 2000.  This was worked  up, and she was found to have left lower extremity phlebitis. Dr. Chestine Spore handled treatment of DVT.  She became afebrile and started to get up and around.  At the time of discharge, the patient was up and around, tolerating a diet, and was anticoagulated.  CONDITION ON DISCHARGE:  Stable.  DIET:  As tolerated.  ACTIVITY:  As tolerated.  DISCHARGE MEDICATIONS:  Synthroid, Coumadin, and Restoril.  FOLLOW-UP:  Dr. Chestine Spore has arranged for her to have visiting nurse service and to have twice-a-week pro times.  I will see her in approximately two weeks, and we will plan repair of fistula and colostomy probably in several months. DD:  04/29/00 TD:  04/29/00 Job: [id number]

## 2010-12-13 NOTE — Discharge Summary (Signed)
[facility]  Patient:    MARIETTE, COWLEY                        MRN: [id number] Adm. Date:  [id number] Disc. Date: [id number] Attending:  Fortino Sic CC:         Alfredia Ferguson, M.D.   Discharge Summary  ADMISSION DIAGNOSIS:  Inferocutaneous fistula.  DISCHARGE DIAGNOSIS:  Inferocutaneous fistula.  OPERATIONS:  December 30, 2000, resection of transverse colon with colocolic anastomosis to close inferocutaneous fistula.  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  LABORATORY DATA:  Admission laboratory and x-ray data are not particularly contributory.  The patient had preadmission CT scan and enema revealing a colocutaneous fistula and ______ splenic flexure.  HOSPITAL COURSE:  The patient was admitted and underwent bowel prep.  The outpatient was performed.  Her postoperative course was benign until the third or fourth day when there became a skin gap in the closest of her lower wound flap which was an old skin graft.  She was seen in consultation by plastic surgery, and daily dressing changes and normal saline wet-to-dry was started. The graft was continued to slough but the intra-abdominal content seems to be well fixed.  At the time of discharge the patient is afebrile, up and about, tolerating a diet.  Her discharge condition is stable.  Diet as tolerated. Ambulation.  No lifting or calisthenics.  DISCHARGE MEDICATIONS:  Vicodin.  FOLLOW-UP:  She is to see me in approximately two weeks, but she will call the office every day.  She will be seen by home health and will do normal saline wet-to-dry once a day.  She will do normal saline wet-to-dry once a day.  I will also arrange for a follow-up with plastic surgery. DD:  01/10/01 TD:  01/10/01 Job: [id number]

## 2010-12-13 NOTE — Op Note (Signed)
[facility]  Patient:    Christine Hicks, Christine Hicks                        MRN: [id number] Proc. Date: 04/03/00 Adm. Date:  [id number] Attending:  Fortino Sic                           Operative Report  PREOPERATIVE DIAGNOSIS:  Complex open abdominal wall wound.  POSTOPERATIVE DIAGNOSIS:  Complex open abdominal wall wound.  OPERATION:  Excisional debridement of necrotic material abdominal wall wound and placement of porcine skin 240 sq cm.  SURGEON:  Alfredia Ferguson, M.D.  ANESTHESIA:  General endotracheal.  INDICATIONS:  This is a 55 year old obese white female who is status post infected abdominal wound which required debridement and removal of Gore-Tex graft material.  She also has a large complex open wound which is going to need skin grafting.  The patient is heavily contaminated due to her infection and to a colostomy which is in the vicinity of the wound.  The patient is brought to the operating room today for debridement of the residual necrotic material and placement of porcine skin.  DESCRIPTION OF PROCEDURE:  After adequate general endotracheal anesthesia had been induced, the patients abdomen was prepped and draped in a sterile fashion.  The inferior portion of the wound was sharply debrided, removing some fat necrosis and some necrotic tissue.  The entire wound was then curetted, removing superficial granulation tissue to try to reduce the bacterial contamination of the wound.  The wound was copiously irrigated with three liters of normal saline using a pulse lavage irrigator.  Following irrigation of the wound, hemostasis was achieved using electrocautery. Porcine skin was then placed in the wound and fixed in position using skin staples.  The entire wound was covered.  A bulky dressing was placed.  The patient tolerated the procedure well.  She was awakened, extubated, and transferred to the recovery room in satisfactory condition. DD:   04/03/00 TD:  04/05/00 Job: [id number]

## 2010-12-13 NOTE — Discharge Summary (Signed)
[facility]  Patient:    Christine Hicks, Christine Hicks                        MRN: [id number] Adm. Date:  [id number] Disc. Date: [id number] Attending:  Fortino Sic                           Discharge Summary  ADMISSION DIAGNOSIS:  Enterocutaneous fistula with subcutaneous infection.  DISCHARGE DIAGNOSIS:  Enterocutaneous fistula with subcutaneous infection.  OPERATION:  None.  HISTORY OF PRESENT ILLNESS:  This is a 55 year old female, status post drainage of intra-abdominal abscess, secondary to a colostomy perforation, associated with an abdominal wall disruption, and multiple split thickness skin grafting.  She comes in with a tender indurated area in the right lower quadrant which has green small bowel-appearing drainage.  She had a percutaneous drain in the left side, and has a colostomy and a side colon fistula from the colostomy.  LABORATORY DATA:  Upon admission revealed a normal hemoglobin and white count, and essentially normal CMET.  HOSPITAL COURSE:  The patient was admitted.  A Hohn catheter was placed. Total parenteral nutrition was started.  We determined that our best option was to maintain her on bowel rest with good nutrition.  A CT scan performed on May 27, 2000, revealed that the left-sided abscess had resolved, and the drain was removed.  She remained very stable.  Dr. Lindell Spar. Chestine Spore was of great assistance in managing her diabetes mellitus.  Her hospital course was fairly benign, and she remained afebrile throughout. The left lower quadrant collection appeared to reform, and a percutaneous drainage was done by radiology on June 11, 2000.  This drain site was injected and was found to be a quite long subcutaneous small bowel fistula. The patient was started on liquids, and ultimately a diet was restarted.  The Jackson-Pratt drain was left in place.  With the help of the infectious disease service, a program was started, which  allowed her to be discharged.  DISPOSITION:  At the time of discharge she was afebrile and tolerating a diet. She was discharged home with [facility].  DISCHARGE MEDICATIONS:  She was discharged on her preadmission medications.  FOLLOWUP:  She is to followed up in the office 10 days after discharge. DD:  07/27/00 TD:  07/27/00 Job: [id number]

## 2010-12-13 NOTE — H&P (Signed)
[place]. [facility]  Patient:    Christine Hicks, Christine Hicks                        MRN: [id number] Adm. Date:  [id number] Disc. Date: [id number] Attending:  Fortino Sic CC:         Lindell Spar. Chestine Spore, M.D.   History and Physical  CHIEF COMPLAINT:  Wound pain.  HISTORY OF PRESENT ILLNESS:  This is a 55 year old female, gravida 4 para 2 AB 1 tubal pregnancy 1, last menstrual period beginning in January, comes in with a wound infection, status post repair of recurrent abdominal wall hernia. The hernia was repaired on February 22, 2000 and the patient was discharged from the hospital approximately a week after that.  She noticed a temperature of 101 yesterday and some foul-smelling drainage from the transverse wound. Several sutures were removed.  A small hematoma was drained, and the mesh was exposed.  She has no nausea or vomiting, no chills or sweating spells.  Her bowels have been moving.  She denies dysuria, frequency, hematuria, or stone.  PAST MEDICAL HISTORY:  She has had bronchitis, hypothyroidism, and hypertension.  PAST SURGICAL HISTORY:  She has had tubal pregnancy operation, several ventral hernia repairs, and in addition, had a small bowel resection with the last ventral hernia repair.  HABITS:  Cigarettes - none.  Alcohol - none.  MEDICATIONS:  Synthroid, Aldomet, Cipro, and Levaquin.  FAMILY HISTORY:  Not contributory.  REVIEW OF SYSTEMS:  Essentially as above.  PHYSICAL EXAMINATION:  VITAL SIGNS:  Temperature 100, pulse 80, respirations 20.  GENERAL APPEARANCE:  Well-developed, obese, in no distress.  HEENT:  Cranium normocephalic.  Eyes, ears, nose, throat normal.  NECK:  Supple.  CHEST:  Clear.  HEART:  In regular rhythm.  ABDOMEN:  Obese.  There is a large transverse incision.  The mid portion is opened and there is some bloody drainage, and a Gore-Tex mesh is exposed.  The majority of staples were left in place.  In addition,  approximately three inches caudad in the midline, there is an area of skin breakdown which was debrided, and there is shaggy fat seen in the base of this wound with no collection.  EXTREMITIES, NEUROLOGIC, AND SKIN:  Within normal limits.  ADMITTING IMPRESSION:  Wound infection, status post ventral hernia repair with mesh exposed. DD:  03/10/00 TD:  03/10/00 Job: [id number]

## 2010-12-13 NOTE — H&P (Signed)
[place]. [facility]  Patient:    Christine Hicks, Christine Hicks                        MRN: [id number] Adm. Date:  [id number] Disc. Date: [id number] Attending:  Fortino Sic CC:         Lindell Spar. Chestine Spore, M.D.   History and Physical  CHIEF COMPLAINT:  Abdominal wall pain and drainage.  HISTORY OF PRESENT ILLNESS:  This is a 55 year old female, very recently discharged after multiple operations at [facility].  She underwent female surgery several years ago.  This developed into a ventral hernia which was repaired several times.  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  The colon fistula was converted to a skin level colostomy, with multiple plastic procedures, and the abdominal wall was covered with split thickness skin. Approximately two weeks ago she developed an abscess in the abdominal wall on the left side.  This was drained percutaneously.  A CT scan did not suggest a fistula.  In the last day or two she has developed right lower quadrant pain and tenderness and induration, and small bowel-appearing fluid is now draining from the right lower part of the abdomen.  She is afebrile, with no chills or sweats.  No nausea or vomiting, though she is anorectic.  She is having an occasional rectal bowel movement in addition to some colostomy movement. There is a small amount of bright red blood present, but this does not seem like a significant amount.  PAST MEDICAL HISTORY: 1. She has been treated for thyroid. 2. Had a deep vein thrombosis during her last hospitalization, and was    on Coumadin.  PAST SURGICAL HISTORY: 1. She has had multiple abdominal procedures, starting with a gynecological    procedure and then several procedures for a ventral hernia, a recurrent    ventral hernia, and an incarcerated ventral hernia. 2. She has also had several small bowel resections associated with  these    ventral hernia repairs.  SOCIAL HISTORY:  Cigarettes:  None.  Alcohol:  None.  CURRENT MEDICATIONS: 1. Synthroid 0.2 mg q.d. 2. Coumadin 6 mg q.d.  ALLERGIES:  No known drug allergies.  FAMILY HISTORY:  Noncontributory.  REVIEW OF SYSTEMS:  Otherwise essentially negative.  PHYSICAL EXAMINATION:  VITAL SIGNS:  Temperature 97.9 degrees, pulse 88, respirations 30, blood pressure 105/50.  GENERAL:  A very obese well-developed female, in no distress.  HEENT:  Cranium normocephalic.  Eyes, ears, nose, throat:  Normal.  NECK:  Supple.  CHEST:  Clear.  HEART:  A regular rhythm without thrills, murmurs, or gallops.  ABDOMEN:  Soft with good bowel sounds.  A large split thickness skin graft in the upper abdomen, with one clear-cut small fistula and one incipient fistula.  She has an area of induration and tenderness in the right lower quadrant with a small transverse opening that is draining bilious material.  She has a percutaneous drain placed in the left lower quadrant at x-ray.  PELVIC:  Not performed.  RECTAL:  Not performed.  EXTREMITIES/NEUROLOGIC/SKIN:  Essentially within normal limits, with no signs of deep vein thrombosis at present.  ADMISSION IMPRESSION: 1. 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 2. Status post split thickness skin graft for abdominal wall coverage. 3. Recent deep vein thrombosis. 4. Hypothyroidism.DD:  05/22/00 TD:  05/22/00 Job: [id number]

## 2010-12-13 NOTE — Op Note (Signed)
[facility]  Patient:    Christine Hicks, Christine Hicks                        MRN: [id number] Proc. Date: 03/16/00 Adm. Date:  [id number] Attending:  Fortino Sic                           Operative Report  PREOPERATIVE DIAGNOSIS:  Drainage large anterior abdominal abscess with large subcutaneous cavity.  POSTOPERATIVE DIAGNOSIS:  Drainage large anterior abdominal abscess with large subcutaneous cavity.  OPERATION PERFORMED:  Dressing change and repacking under MAC.  SURGEON:  Marnee Spring. Arkin, M.D.  DESCRIPTION OF PROCEDURE:  With the patient well sedated, the dressing was removed.  The wound was irrigated using the Pulsivac.  There was a great deal of good granulation tissue and only a few areas of thick white tissue which were debrided using the Pulsivac.  The wound was then repacked with saline impregnated gauze and dressing was applied.  Estimated blood loss minimal. The patient received no blood.  She left the operating room in stable condition after sponge and needle counts were verified. DD:  03/16/00 TD:  03/17/00 Job: [id number]

## 2010-12-13 NOTE — Op Note (Signed)
. [facility]  Patient:    Christine Hicks, Christine Hicks                        MRN: [id number] Proc. Date: 02/22/00 Attending:  Marnee Spring. Wiliam Ke, M.D. CC:         Lindell Spar. Chestine Spore, M.D.                           Operative Report  PREOPERATIVE DIAGNOSIS:  Incarcerated ventral hernia with small bowel obstruction.  POSTOPERATIVE DIAGNOSIS:  Incarcerated ventral hernia with small bowel obstruction.  SURGEON:  Marnee Spring. Wiliam Ke, M.D.  PROCEDURE:  Repair of recurrent incarcerated ventral hernia with small bowel resection anastomosis and Gore-Tex duo-mesh path.  ANESTHESIA:  General endotracheal anesthesia.  PROCEDURE IN DETAIL:  Under adequate endotracheal anesthesia with a nasogastric tube in the stomach, and a Foley catheter in the bladder, the skin of the abdomen was prepped and draped in the usual manner.  The abdomen was massively obese.  A transverse incision was made over the area of maximum tenderness.  This was deepened to the subcutaneous tissue.  There was a massively incarcerated hernia sac, which was filled with small bowel.  The incision was continued across the midline to the right.  The entire hernia content was dissected free from the subcutaneous tissue.  The hernia sac was entered and multiple adhesions between the anterior abdominal wall and between loops of small intestine were taken down.  There is a piece of small intestine that was so tightly adherent to the previously used Marlex mesh that, at this point, an enterotomy was made.  The dissection continued after the enterotomy was quickly closed.  Finally, all the small intestine and colon was freed from adhesions.  At this point I determine that the loop of intestine with the enterotomy was too bruised and battered for repair, and a small intestinal resection was performed.  The abnormal loop was divided proximally and distally using the GIA.  Vessels in the mid mesentery were doubly clamped, cut,  and tied off with 0 Vicryl.  An opening was made in each loop.  The GIA was inserted in each loop and discharged.  The staple line was hemostatic. The opening through which the GIA had been placed was then closed with a TA machine.  The defect in the mesentery was closed with interrupted 2-0 silk sutures.  The abdomen was copiously irrigated with saline and sucked dry. Gown and gloves were changed.  There was no hope of closing the abdomen without a patch.  The defect was massive, and under quite a bit of tension. The largest available duo-mesh patch was used.  It was sewn in place with approximately 25 horizontal mattress sutures of 0 novofil.  After this was accomplished, the wound was copiously irrigated with saline and sucked dry. Several pieces of hernia sac adherent to the subcutaneous tissue were excised. The wound was closed without drainage.  Subcutaneous 0 Vicryl sutures were first used, then stainless steel staples.  Estimated blood loss for this procedure was 500 cc.  The patient received no blood.  She left the operating room in stable condition after the sponge and needle counts were verified. DD:  02/23/00 TD:  02/24/00 Job: [id number]

## 2010-12-13 NOTE — Op Note (Signed)
[facility]  Patient:    Christine Hicks, Christine Hicks                        MRN: [id number] Proc. Date: 04/08/00 Adm. Date:  [id number] Attending:  Fortino Sic                           Operative Report  PREOPERATIVE DIAGNOSIS:  Complex open wound, lower abdominal wall.  POSTOPERATIVE DIAGNOSIS:  Complex open wound, lower abdominal wall.  OPERATION PERFORMED:  Excisional debridement of open wound and placement of porcine skin, 250 square centimeters.  SURGEON:  Alfredia Ferguson, M.D.  ANESTHESIA:  General endotracheal.  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  It required removal.  The patient has an open wound which is granulating.  The patient has been undergoing serial debridement.  The patient returns to the operating room today for additional debridement and placement of porcine skin.  The risks of surgery including bleeding were discussed with the patient.  She understands she will require further surgery in the near future.  DESCRIPTION OF PROCEDURE:  After adequate general endotracheal anesthesia had been induced, the patients abdomen was prepped and draped in sterile fashion. The old porcine skin was removed.  Much of it had stuck.  What could not be removed was curetted.  The entire wound was curetted, removing some necrotic material and excess granulation tissue.  There was no purulence present today. The wound looked good today.  There was no odor.  The wound was now copiously irrigated with a pulse lavage irrigator using 3L of normal saline.  Hemostasis was accomplished using pressure.  New porcine skin was placed measuring approximately 300 square centimeters.  The skin was cut to fit and stapled into position.  Bulky dressing was applied.  The patient was awakened, extubated and transported to the recovery room  in satisfactory condition.  DD:  04/08/00 TD:  04/10/00 Job: [id number]

## 2010-12-13 NOTE — Op Note (Signed)
[facility]  Patient:    Christine Hicks, Christine Hicks                        MRN: [id number] Proc. Date: 03/13/00 Adm. Date:  [id number] Attending:  Fortino Sic CC:         Lindell Spar. Chestine Spore, M.D.  Lenn Sink, M.D.  Alfredia Ferguson, M.D.   Operative Report  PREOPERATIVE DIAGNOSIS:  Abdominal abscess with colon fistula.  POSTOPERATIVE DIAGNOSIS:  Abdominal abscess with colon fistula.  OPERATION PERFORMED:  Drainage of abdominal abscess and removal of synthetic abdominal wall patch and skin level colostomy.  SURGEON:  Marnee Spring. Wiliam Ke, M.D.  ASSISTANT:  Alfredia Ferguson, M.D.  ANESTHESIA:  Endotracheal.  ANESTHESIOLOGIST:  Mack Guise, M.D.  DESCRIPTION OF PROCEDURE:  Under good endotracheal anesthesia, the skin of the abdomen was prepped and draped in the usual manner.  There was massive contamination of the subcutaneous space and during prep, a vast of stool cheloid foul-smelling fluid was draining from the top of the visible mesh. All previous staples were removed and the wound was reopened.  The mesh was removed with careful attempt not to damage any of the underneath tissues. There was what appeared to be a 2 cm fistula in what was thought was the transverse colon.  There was such a great deal of inflammatory peel that we were not able to mobilize any of the intestine.  The abscess cavity which made up the entire anterior abdominal wall area was copiously irrigated with saline and sucked dry.  It was also washed with the Pulsivac.  An opening was made in the anterior abdominal wall several cm from the wound and a plug of fat and skin was removed.  The fistulous opening was sewn circumferentially to the skin with six 2-0 Vicryl sutures, thus forming a skin level colostomy.  No attempt was made to mobilize the intestine.  At this point, the wound was packed with saline gauze.  A bag was applied to the colostomy and the patient left the operating room  in satisfactory condition after the sponge and needle counts were verified. DD:  03/13/00 TD:  03/16/00 Job: [id number]

## 2010-12-13 NOTE — Op Note (Signed)
[place]. [facility]  Patient:    Christine Hicks, Christine Hicks                        MRN: [id number] Proc. Date: 12/30/00 Adm. Date:  [id number] Attending:  Fortino Sic                           Operative Report  PREOPERATIVE DIAGNOSIS:  Colocutaneous fistula x 2.  POSTOPERATIVE DIAGNOSIS:  Colocutaneous fistula x 2.  OPERATION PERFORMED:  Take-down of colocutaneous fistulas with resection of transverse colon segment and colocolic anastomosis with splenic flexure mobilization.  SURGEON:  Marnee Spring. Wiliam Ke, M.D.  ASSISTANT:  Mardene Celeste. Lurene Shadow, M.D.  ANESTHESIA:  Endotracheal by hospital.  DESCRIPTION OF PROCEDURE:  Under good endotracheal anesthesia the skin of the abdomen was prepped and draped in the usual manner.  There were two fistula openings just caudad to a large split thickness skin graft site.  A transverse incision was made approximately 6 inches long to include both fistula sites. They were dissected free from subcutaneous tissue and from fascia superiorly. There was no fascia inferiorly.  The colon was markedly adherent to the undersurface of the skin graft.  The entire colon was mobilized both to the left and to the right.  The splenic flexure was mobilized.  When colon was freed from surrounding soft tissues including some small bowel adhesions, it was divided proximally and distally using a TA machine.  Vessels in the colon mesentery were doubly clamped, cut and tied off with 2-0 silk.  The ends of the colon were then mobilized so that they could be brought together.  The staple line was removed.  An anastomosis was performed in an end-to-end fashion using the TA machine.  The staple line was placed across the back wall.  Hemostasis was good.  TA machine was used serially to complete the anastomosis in a triangulation fashion.  Several bleeding points in the staple line were oversewn with 2-0 silk and at the corners, 2-0 silk was used for buttressing.   The defect in the mesentery was then closed with 2-0 silk.  A piece of Seprafilm was placed on top of the colon and small intestine to protect it from the adhesions to the undersurface of the skin  graft.  The abdomen was then closed with some subcutaneous 2-0 Vicryl and a subcuticular 4-0 Dexon placed in a running fashion.  Steri-Strips and a dressing were applied.  Estimated blood loss was approximately 50 cc.  The patient received no blood.  She left the operating room in stable condition after sponge and needle counts were verified. DD:  12/30/00 TD:  12/30/00 Job: [id number]

## 2010-12-13 NOTE — H&P (Signed)
[place]. [facility]  Patient:    Christine Hicks                         MRN: [id number] Adm. Date:  [id number] Attending:  Fortino Sic CC:         Lindell Spar. Chestine Spore, M.D.                         History and Physical  CHIEF COMPLAINT:  Abdominal pain.  HISTORY OF PRESENT ILLNESS:  The patient is a 55 year old female with a huge incarcerated ventral hernia.  She has had pain in the abdomen for two days with nausea but no vomiting.  She has had some bowel movements.  There have been no fever, chills, or sweating spells.  A CT scan was performed which showed a large ventral hernia with an incarcerated small intestine with partial obstruction.  PAST MEDICAL HISTORY:  Hypertension for seven years.  PAST SURGICAL HISTORY:  She had a tubal pregnancy and a ventral hernia times two.  The last one was in 1997.  SOCIAL HISTORY:  Cigarettes none.  Alcohol none.  CURRENT MEDICATIONS:  Aldomet 500 b.i.d., Flexeril, and thyroid.  ALLERGIES:  None.  FAMILY HISTORY:  There has been a history of hypertension and heart disease in her family.  REVIEW OF SYSTEMS:  Negative.  PHYSICAL EXAMINATION:  VITAL SIGNS:  Temperature is 88, respirations 16, and blood pressure 170/90.  GENERAL:  Well-developed, extremely obese in mild abdominal distress.  HEENT:  Cranium normocephalic.  Eyes, ears, nose, and throat normal.  NECK:  Supple.  CHEST:  Clear.  CARDIOVASCULAR:  Regular rhythm without rubs, murmurs, or gallops.  ABDOMEN:  Very obese and markedly tender especially in the left lower quadrant with a huge incarcerated ventral hernia.  EXTREMITIES/NEUROLOGICAL/SKIN:  Essentially within normal limits.  ADMITTING IMPRESSION:  Incarcerated ventral hernia. DD:  02/22/00 TD:  02/24/00 Job: [id number]

## 2015-11-10 ENCOUNTER — Observation Stay (HOSPITAL_COMMUNITY)
Admission: EM | Admit: 2015-11-10 | Discharge: 2015-11-18 | Disposition: A | Discharge status: Home / Self Care | Payer: BC Managed Care – PPO | Attending: Internal Medicine | Admitting: Internal Medicine

## 2015-11-10 ENCOUNTER — Encounter (HOSPITAL_COMMUNITY): Payer: Self-pay | Admitting: Emergency Medicine

## 2015-11-10 DIAGNOSIS — Z87891 Personal history of nicotine dependence: Secondary | ICD-10-CM | POA: Insufficient documentation

## 2015-11-10 DIAGNOSIS — I110 Hypertensive heart disease with heart failure: Secondary | ICD-10-CM | POA: Diagnosis not present

## 2015-11-10 DIAGNOSIS — Z86718 Personal history of other venous thrombosis and embolism: Secondary | ICD-10-CM | POA: Diagnosis not present

## 2015-11-10 DIAGNOSIS — Z79899 Other long term (current) drug therapy: Secondary | ICD-10-CM | POA: Insufficient documentation

## 2015-11-10 DIAGNOSIS — R9431 Abnormal electrocardiogram [ECG] [EKG]: Secondary | ICD-10-CM | POA: Diagnosis present

## 2015-11-10 DIAGNOSIS — Q2381 Bicuspid aortic valve: Secondary | ICD-10-CM

## 2015-11-10 DIAGNOSIS — D509 Iron deficiency anemia, unspecified: Principal | ICD-10-CM | POA: Insufficient documentation

## 2015-11-10 DIAGNOSIS — D649 Anemia, unspecified: Secondary | ICD-10-CM | POA: Diagnosis present

## 2015-11-10 DIAGNOSIS — Q231 Congenital insufficiency of aortic valve: Secondary | ICD-10-CM | POA: Diagnosis not present

## 2015-11-10 DIAGNOSIS — I5032 Chronic diastolic (congestive) heart failure: Secondary | ICD-10-CM | POA: Insufficient documentation

## 2015-11-10 DIAGNOSIS — Z6841 Body Mass Index (BMI) 40.0 and over, adult: Secondary | ICD-10-CM | POA: Diagnosis not present

## 2015-11-10 DIAGNOSIS — L03119 Cellulitis of unspecified part of limb: Secondary | ICD-10-CM

## 2015-11-10 DIAGNOSIS — R0602 Shortness of breath: Secondary | ICD-10-CM

## 2015-11-10 DIAGNOSIS — E039 Hypothyroidism, unspecified: Secondary | ICD-10-CM | POA: Diagnosis not present

## 2015-11-10 DIAGNOSIS — I4581 Long QT syndrome: Secondary | ICD-10-CM | POA: Diagnosis not present

## 2015-11-10 DIAGNOSIS — I82411 Acute embolism and thrombosis of right femoral vein: Secondary | ICD-10-CM | POA: Insufficient documentation

## 2015-11-10 DIAGNOSIS — Z7982 Long term (current) use of aspirin: Secondary | ICD-10-CM | POA: Insufficient documentation

## 2015-11-10 DIAGNOSIS — R0600 Dyspnea, unspecified: Secondary | ICD-10-CM | POA: Diagnosis present

## 2015-11-10 DIAGNOSIS — K5730 Diverticulosis of large intestine without perforation or abscess without bleeding: Secondary | ICD-10-CM | POA: Diagnosis not present

## 2015-11-10 DIAGNOSIS — L02419 Cutaneous abscess of limb, unspecified: Secondary | ICD-10-CM | POA: Diagnosis present

## 2015-11-10 DIAGNOSIS — R195 Other fecal abnormalities: Secondary | ICD-10-CM | POA: Insufficient documentation

## 2015-11-10 DIAGNOSIS — R600 Localized edema: Secondary | ICD-10-CM

## 2015-11-10 DIAGNOSIS — I82401 Acute embolism and thrombosis of unspecified deep veins of right lower extremity: Secondary | ICD-10-CM

## 2015-11-10 DIAGNOSIS — I350 Nonrheumatic aortic (valve) stenosis: Secondary | ICD-10-CM | POA: Insufficient documentation

## 2015-11-10 DIAGNOSIS — I10 Essential (primary) hypertension: Secondary | ICD-10-CM | POA: Diagnosis present

## 2015-11-10 DIAGNOSIS — K648 Other hemorrhoids: Secondary | ICD-10-CM | POA: Diagnosis not present

## 2015-11-10 HISTORY — DX: Congenital insufficiency of aortic valve: Q23.1

## 2015-11-10 HISTORY — DX: Essential (primary) hypertension: I10

## 2015-11-10 HISTORY — DX: Disorder of thyroid, unspecified: E07.9

## 2015-11-10 HISTORY — DX: Iron deficiency anemia, unspecified: D50.9

## 2015-11-10 MED ORDER — IPRATROPIUM-ALBUTEROL 0.5-2.5 (3) MG/3ML IN SOLN
3.0000 mL | Freq: Once | RESPIRATORY_TRACT | Status: AC
  Administered 2015-11-11: 3 mL via RESPIRATORY_TRACT
  Filled 2015-11-10: qty 3

## 2015-11-10 MED ORDER — FUROSEMIDE 10 MG/ML IJ SOLN
40.0000 mg | Freq: Once | INTRAMUSCULAR | Status: AC
  Administered 2015-11-10: 40 mg via INTRAVENOUS
  Filled 2015-11-10: qty 4

## 2015-11-10 NOTE — ED Provider Notes (Signed)
By signing my name below, I, Linus Galas, attest that this documentation has been prepared under the direction and in the presence of [employer], DO. Electronically Signed: Linus Galas, ED Scribe. 11/10/2015. 11:43 PM.  TIME SEEN: 11:18 PM  CHIEF COMPLAINT:  Chief Complaint  Patient presents with  . Shortness of Breath   HPI:  HPI Comments: Christine Hicks is a 60 y.o. female with history of hypertension, hypothyroidism, pedal edema on Lasix 40 mg once a day, previous history of DVT after a surgery and hospitalization no longer on anticoagulation brought by EMS to the Emergency Department complaining of SOB that began 1 week ago. Pt states her SOB is worse with exertion. Pt also reports a  productive cough with clear sputum and nasal congestion for the past 3 weeks.  Has had BLE swelling, right worse than left that is worse than her baseline for the past 2 days.  Also c/o waxing and wanning left arm pain. Pt states her left arm pain began 8 hours ago.  Describes it as cramping in her left hand. Had one episode of vomiting today. States she vomited up "phlegm". Pt denies fevers, chest pain, or any other symptoms at this time. Does not wear oxygen at home. Pt takes Lasix , daily but missed a dose yesterday.   Pt has not smoked in 20 years. Pt denies hx of a stress test or cardiac catheretization.    Dr. Billee Cashing PCP  ROS: See HPI Constitutional: no fever  Eyes: no drainage  ENT: no runny nose   Cardiovascular:  Leg swelling, no chest pain  Resp: SOB, cough, congestion GI: vomiting GU: no dysuria Integumentary: no rash  Allergy: no hives  Musculoskeletal:  leg swelling  Neurological: no slurred speech ROS otherwise negative  PAST MEDICAL HISTORY/PAST SURGICAL HISTORY:  Past Medical History  Diagnosis Date  . Hypertension   . Thyroid disease     MEDICATIONS:  Prior to Admission medications   Medication Sig Start Date End Date Taking? Authorizing Provider   aspirin 325 MG tablet Take 325 mg by mouth every 6 (six) hours as needed for moderate pain.   Yes Historical Provider, MD  benazepril (LOTENSIN) 20 MG tablet Take 20 mg by mouth daily.   Yes Historical Provider, MD  furosemide (LASIX) 40 MG tablet Take 40 mg by mouth daily.   Yes Historical Provider, MD  guaiFENesin (MUCINEX) 600 MG 12 hr tablet Take 600 mg by mouth 2 (two) times daily as needed for cough or to loosen phlegm.   Yes Historical Provider, MD  ibuprofen (ADVIL,MOTRIN) 600 MG tablet Take 600 mg by mouth every 6 (six) hours as needed for moderate pain.   Yes Historical Provider, MD  levothyroxine (SYNTHROID, LEVOTHROID) 175 MCG tablet Take 175 mcg by mouth daily before breakfast.   Yes Historical Provider, MD  Phenylephrine-APAP-Guaifenesin (MUCINEX FAST-MAX) 10-650-400 MG/20ML LIQD Take 30 mLs by mouth daily as needed (for cough).   Yes Historical Provider, MD  potassium chloride SA (K-DUR,KLOR-CON) 20 MEQ tablet Take 20 mEq by mouth daily.   Yes Historical Provider, MD  zolpidem (AMBIEN) 5 MG tablet Take 5 mg by mouth at bedtime as needed for sleep.   Yes Historical Provider, MD    ALLERGIES:  No Known Allergies  SOCIAL HISTORY:  Social History  Substance Use Topics  . Smoking status: Former Games developer  . Smokeless tobacco: Not on file  . Alcohol Use: No    FAMILY HISTORY: No family history on file.  EXAM: BP 136/53 mmHg  Pulse 100  Temp(Src) 99.5 F (37.5 C) (Oral)  Resp 27  SpO2 98%  LMP  (LMP Unknown) CONSTITUTIONAL: Alert and oriented and responds appropriately to questions. Well-appearing; well-nourished, obese HEAD: Normocephalic EYES: Conjunctivae clear, PERRL ENT: normal nose; no rhinorrhea; moist mucous membranes; pharynx without lesions noted NECK: Supple, no meningismus, no LAD  CARD: regular and tachycardic; S1 and S2 appreciated; systolic murmur, no clicks, no rubs, no gallops RESP: Normal chest excursion without splinting or tachypnea; breath sounds  clear and equal bilaterally; no wheezes, no rhonchi, no rales, No hypoxia or respiratory distress, speaking full sentences ABD/GI: Normal bowel sounds; non-distended; soft, non-tender, no rebound, no guarding RECTAL:  Normal rectal tone, no gross blood or melena, guaiac positive, no hemorrhoids appreciated, nontender rectal exam BACK:  The back appears normal and is non-tender to palpation, there is no CVA tenderness EXT: Normal ROM in all joints; non-tender to palpation; 3+ pitting edema, right worse than left; normal capillary refill; no cyanosis    SKIN: Normal color for age and race; warm NEURO: Moves all extremities equally, sensation to light touch intact diffusely PSYCH: The patient's mood and manner are appropriate. Grooming and personal hygiene are appropriate.  MEDICAL DECISION MAKING: Patient here with shortness of breath that is worse with exertion. Has had cough and nasal congestion for the past week. His have a low-grade temperature here of 9.5. Lungs are clear to auscultation and she has no hypoxia on room air. In no respiratory distress. She does have a murmur which she reports is chronic. She does appear to be volume overloaded on exam. Reports she has chronic lower extremity edema, right greater than left but states is worse than normal. She did have a DVT after a surgery, hospitalization but is no longer on anticoagulation. Differential diagnosis includes influenza, pneumonia, CHF, PE, ACS. We'll obtain cardiac labs including troponin, BNP and d-dimer. Will give duo neb to see if this helps her symptomatically and IV Lasix. Will check influenza swab and rectal temperature.  ED PROGRESS: Patient's labs show mild leukocytosis. She does have a hemoglobin of 7.2. Last hemoglobin for comparison was normal in 2012. We'll check a Hemoccult on patient. BNP is mildly elevated at 291. Troponin is negative. D-dimer is 3.35. We will obtain a CT of her chest for further evaluation.      Patient  has no bright red blood per rectum or melena. Rectal exam shows hemorrhoids but no blood. Denies vaginal bleeding. States her last colonoscopy was in 2006. Is not sure what her blood counts normally run. Does not have cardiac disease. She is hemodynamically stable but her low hemoglobin may be causing her to be short of breath. We will transfuse patient with 1 unit of packed red blood cells.  3:40 AM  Pt's CT scan shows no pulmonary embolus, infiltrate or significant edema. Suspect her symptoms are from symptomatic anemia. She is waiting for 1 unit of packed red blood cells. Discussed with Dr. Maryfrances Bunnell with hospitalist service who agrees on admission to telemetry, observation bed. I will place: Orders per his request. Care transferred to hospitalist service. Patient and family updated with plan.   EKG Interpretation  Date/Time:  Saturday November 10 2015 21:56:13 EDT Ventricular Rate:  104 PR Interval:  154 QRS Duration: 139 QT Interval:  400 QTC Calculation: 526 R Axis:   92 Text Interpretation:  Sinus tachycardia Right bundle branch block No significant change since last tracing in 2012 Confirmed by Chrisa Hassan,  DO,  Avionna Bower [phone]([id number]) on 11/10/2015 11:08:22 PM       CRITICAL CARE Performed by: Raelyn NumberWARD, Bristol Osentoski N   Total critical care time: 45 minutes  Critical care time was exclusive of separately billable procedures and treating other patients.  Critical care was necessary to treat or prevent imminent or life-threatening deterioration.  Critical care was time spent personally by me on the following activities: development of treatment plan with patient and/or surrogate as well as nursing, discussions with consultants, evaluation of patient's response to treatment, examination of patient, obtaining history from patient or surrogate, ordering and performing treatments and interventions, ordering and review of laboratory studies, ordering and review of radiographic studies, pulse oximetry and  re-evaluation of patient's condition.    I personally performed the services described in this documentation, which was scribed in my presence. The recorded information has been reviewed and is accurate.    Layla MawKristen N Juel Ripley, DO 11/11/15 [phone]

## 2015-11-10 NOTE — ED Notes (Signed)
[facility] for complaints of increased SOB and pitting edema in lower extremities. Pt takes Lasix at home for swelling but it is no longer effective. Reports new left arm pain in shoulder, that is not constant. Hand cramping present earlier. Has had cold for 3 weeks. Shortness of breath prevents her from walking to bedroom, worse tonight. EMS EKG shows RT BBB. Vital signs stable at 130/88. HR elevated at 120.

## 2015-11-11 ENCOUNTER — Emergency Department (HOSPITAL_COMMUNITY): Payer: BC Managed Care – PPO

## 2015-11-11 ENCOUNTER — Encounter (HOSPITAL_COMMUNITY): Payer: Self-pay | Admitting: Radiology

## 2015-11-11 DIAGNOSIS — I4581 Long QT syndrome: Secondary | ICD-10-CM

## 2015-11-11 DIAGNOSIS — D649 Anemia, unspecified: Secondary | ICD-10-CM | POA: Diagnosis present

## 2015-11-11 DIAGNOSIS — Q231 Congenital insufficiency of aortic valve: Secondary | ICD-10-CM

## 2015-11-11 DIAGNOSIS — R9431 Abnormal electrocardiogram [ECG] [EKG]: Secondary | ICD-10-CM | POA: Diagnosis present

## 2015-11-11 DIAGNOSIS — R0600 Dyspnea, unspecified: Secondary | ICD-10-CM | POA: Diagnosis present

## 2015-11-11 DIAGNOSIS — L02419 Cutaneous abscess of limb, unspecified: Secondary | ICD-10-CM | POA: Diagnosis not present

## 2015-11-11 DIAGNOSIS — Q2381 Bicuspid aortic valve: Secondary | ICD-10-CM

## 2015-11-11 HISTORY — DX: Congenital insufficiency of aortic valve: Q23.1

## 2015-11-11 HISTORY — DX: Bicuspid aortic valve: Q23.81

## 2015-11-11 LAB — IRON AND TIBC
IRON: 10 ug/dL — AB (ref 28–170)
SATURATION RATIOS: 2 % — AB (ref 10.4–31.8)
TIBC: 552 ug/dL — AB (ref 250–450)
UIBC: 542 ug/dL

## 2015-11-11 LAB — CBC WITH DIFFERENTIAL/PLATELET
BASOS ABS: 0 10*3/uL (ref 0.0–0.1)
Basophils Relative: 0 %
EOS ABS: 0.2 10*3/uL (ref 0.0–0.7)
Eosinophils Relative: 2 %
HEMATOCRIT: 28 % — AB (ref 36.0–46.0)
Hemoglobin: 7.2 g/dL — ABNORMAL LOW (ref 12.0–15.0)
LYMPHS ABS: 2.9 10*3/uL (ref 0.7–4.0)
Lymphocytes Relative: 27 %
MCH: 17.2 pg — ABNORMAL LOW (ref 26.0–34.0)
MCHC: 25.7 g/dL — AB (ref 30.0–36.0)
MCV: 67 fL — ABNORMAL LOW (ref 78.0–100.0)
MONOS PCT: 7 %
Monocytes Absolute: 0.8 10*3/uL (ref 0.1–1.0)
NEUTROS ABS: 6.8 10*3/uL (ref 1.7–7.7)
Neutrophils Relative %: 64 %
PLATELETS: 183 10*3/uL (ref 150–400)
RBC: 4.18 MIL/uL (ref 3.87–5.11)
RDW: 18.9 % — AB (ref 11.5–15.5)
WBC: 10.7 10*3/uL — AB (ref 4.0–10.5)

## 2015-11-11 LAB — BILIRUBIN, FRACTIONATED(TOT/DIR/INDIR)
BILIRUBIN DIRECT: 0.1 mg/dL (ref 0.1–0.5)
BILIRUBIN TOTAL: 0.6 mg/dL (ref 0.3–1.2)
Indirect Bilirubin: 0.5 mg/dL (ref 0.3–0.9)

## 2015-11-11 LAB — APTT: APTT: 33 s (ref 24–37)

## 2015-11-11 LAB — I-STAT TROPONIN, ED: TROPONIN I, POC: 0 ng/mL (ref 0.00–0.08)

## 2015-11-11 LAB — BASIC METABOLIC PANEL
ANION GAP: 10 (ref 5–15)
BUN: 9 mg/dL (ref 6–20)
CALCIUM: 8.7 mg/dL — AB (ref 8.9–10.3)
CO2: 20 mmol/L — AB (ref 22–32)
CREATININE: 0.7 mg/dL (ref 0.44–1.00)
Chloride: 105 mmol/L (ref 101–111)
Glucose, Bld: 113 mg/dL — ABNORMAL HIGH (ref 65–99)
Potassium: 3.7 mmol/L (ref 3.5–5.1)
SODIUM: 135 mmol/L (ref 135–145)

## 2015-11-11 LAB — RETICULOCYTES
RBC.: 3.91 MIL/uL (ref 3.87–5.11)
RETIC CT PCT: 2.3 % (ref 0.4–3.1)
Retic Count, Absolute: 89.9 10*3/uL (ref 19.0–186.0)

## 2015-11-11 LAB — LACTIC ACID, PLASMA: Lactic Acid, Venous: 1.7 mmol/L (ref 0.5–2.0)

## 2015-11-11 LAB — HEMOGLOBIN AND HEMATOCRIT, BLOOD
HEMATOCRIT: 28.6 % — AB (ref 36.0–46.0)
HEMOGLOBIN: 7.6 g/dL — AB (ref 12.0–15.0)

## 2015-11-11 LAB — FERRITIN: Ferritin: 8 ng/mL — ABNORMAL LOW (ref 11–307)

## 2015-11-11 LAB — PROTIME-INR
INR: 1.18 (ref 0.00–1.49)
Prothrombin Time: 15.2 seconds (ref 11.6–15.2)

## 2015-11-11 LAB — BRAIN NATRIURETIC PEPTIDE: B NATRIURETIC PEPTIDE 5: 291.3 pg/mL — AB (ref 0.0–100.0)

## 2015-11-11 LAB — INFLUENZA PANEL BY PCR (TYPE A & B)
H1N1FLUPCR: NOT DETECTED
INFLAPCR: NEGATIVE
INFLBPCR: NEGATIVE

## 2015-11-11 LAB — POC OCCULT BLOOD, ED: FECAL OCCULT BLD: POSITIVE — AB

## 2015-11-11 LAB — PREPARE RBC (CROSSMATCH)

## 2015-11-11 LAB — LACTATE DEHYDROGENASE: LDH: 230 U/L — AB (ref 98–192)

## 2015-11-11 LAB — D-DIMER, QUANTITATIVE: D-Dimer, Quant: 3.35 ug/mL-FEU — ABNORMAL HIGH (ref 0.00–0.50)

## 2015-11-11 LAB — TSH: TSH: 0.18 u[IU]/mL — ABNORMAL LOW (ref 0.350–4.500)

## 2015-11-11 MED ORDER — SENNOSIDES-DOCUSATE SODIUM 8.6-50 MG PO TABS: 1.0000 | ORAL_TABLET | Freq: Every evening | ORAL | Status: DC | PRN

## 2015-11-11 MED ORDER — SODIUM CHLORIDE 0.9 % IV SOLN
510.0000 mg | Freq: Once | INTRAVENOUS | Status: AC
  Administered 2015-11-11: 510 mg via INTRAVENOUS
  Filled 2015-11-11: qty 17

## 2015-11-11 MED ORDER — FUROSEMIDE 40 MG PO TABS
40.0000 mg | ORAL_TABLET | Freq: Every day | ORAL | Status: DC
  Administered 2015-11-11 – 2015-11-13 (×3): 40 mg via ORAL
  Filled 2015-11-11 (×3): qty 1

## 2015-11-11 MED ORDER — ZOLPIDEM TARTRATE 5 MG PO TABS
5.0000 mg | ORAL_TABLET | Freq: Every evening | ORAL | Status: DC | PRN
  Administered 2015-11-13 – 2015-11-14 (×2): 5 mg via ORAL
  Filled 2015-11-11 (×2): qty 1

## 2015-11-11 MED ORDER — GUAIFENESIN ER 600 MG PO TB12: 600.0000 mg | ORAL_TABLET | Freq: Two times a day (BID) | ORAL | Status: DC | PRN

## 2015-11-11 MED ORDER — ACETAMINOPHEN 325 MG PO TABS
650.0000 mg | ORAL_TABLET | Freq: Four times a day (QID) | ORAL | Status: DC | PRN
  Administered 2015-11-12: 650 mg via ORAL

## 2015-11-11 MED ORDER — CEPHALEXIN 500 MG PO CAPS
500.0000 mg | ORAL_CAPSULE | Freq: Four times a day (QID) | ORAL | Status: DC
  Administered 2015-11-11 – 2015-11-12 (×5): 500 mg via ORAL
  Filled 2015-11-11 (×5): qty 1

## 2015-11-11 MED ORDER — FERROUS SULFATE 325 (65 FE) MG PO TABS
325.0000 mg | ORAL_TABLET | Freq: Two times a day (BID) | ORAL | Status: DC
  Administered 2015-11-11 – 2015-11-15 (×10): 325 mg via ORAL
  Filled 2015-11-11 (×10): qty 1

## 2015-11-11 MED ORDER — POTASSIUM CHLORIDE CRYS ER 20 MEQ PO TBCR
20.0000 meq | EXTENDED_RELEASE_TABLET | Freq: Every day | ORAL | Status: DC
  Administered 2015-11-11 – 2015-11-18 (×8): 20 meq via ORAL
  Filled 2015-11-11 (×8): qty 1

## 2015-11-11 MED ORDER — LEVOTHYROXINE SODIUM 75 MCG PO TABS
175.0000 ug | ORAL_TABLET | Freq: Every day | ORAL | Status: DC
  Administered 2015-11-11: 175 ug via ORAL
  Filled 2015-11-11: qty 1

## 2015-11-11 MED ORDER — ACETAMINOPHEN 650 MG RE SUPP: 650.0000 mg | Freq: Four times a day (QID) | RECTAL | Status: DC | PRN

## 2015-11-11 MED ORDER — BENAZEPRIL HCL 20 MG PO TABS
20.0000 mg | ORAL_TABLET | Freq: Every day | ORAL | Status: DC
  Administered 2015-11-11 – 2015-11-18 (×7): 20 mg via ORAL
  Filled 2015-11-11 (×8): qty 1

## 2015-11-11 MED ORDER — SODIUM CHLORIDE 0.9 % IV SOLN: 10.0000 mL/h | Freq: Once | INTRAVENOUS | Status: DC

## 2015-11-11 MED ORDER — ALBUTEROL SULFATE (2.5 MG/3ML) 0.083% IN NEBU: 2.5000 mg | INHALATION_SOLUTION | RESPIRATORY_TRACT | Status: DC | PRN

## 2015-11-11 MED ORDER — IOPAMIDOL (ISOVUE-370) INJECTION 76%
80.0000 mL | Freq: Once | INTRAVENOUS | Status: AC | PRN
  Administered 2015-11-11: 100 mL via INTRAVENOUS

## 2015-11-11 MED ORDER — LEVOTHYROXINE SODIUM 100 MCG PO TABS
100.0000 ug | ORAL_TABLET | Freq: Every day | ORAL | Status: DC
  Administered 2015-11-12 – 2015-11-18 (×7): 100 ug via ORAL
  Filled 2015-11-11 (×7): qty 1

## 2015-11-11 NOTE — H&P (Signed)
History and Physical  Patient Name: Christine Hicks     ZOX:[id number]    DOB: 15-Mar-1956    DOA: 11/10/2015 Referring provider: Rochele Raring, MD PCP: Billee Cashing, MD  Outpatient specialists: None Patient coming from: Home  Chief Complaint: Dyspnea  HPI: Christine Hicks is a 60 y.o. female with a past medical history significant for bicuspid AV, HTN, and hypothyroidism who presents with progressive dyspnea for 3 weeks.  The patient was in her normal state of health until about 3 weeks or so ago when she started to develop chest congestion and clear sputum. He took cough medicine with no relief. Now in the last week she has noticed increasing shortness of breath with exertion, and tonight this was progressed to the extent that she came to the ER. She is normally able to walk around her house, or around the grocery store without difficulty, but over the last week she has found this difficult, and can barely get into the store from the car. She has had no fever, purulent sputum. She has chronic right leg swelling, not significantly changed. No syncope or chest pain.  In the ED, she had low-grade temperature 99.5 F, tachycardic to 100, BP 140/45 mmHg.  Na 135, K 3.7, Cr 0.7, WBC 10.7K, Hgb 7.2, FOBT positive. BNP 391 pg/mL.  ECG showed sinus tachycardia, long QTC, RBBB. D-dimer elevated, CTA without infiltrates or PE.    Albuterol helped her dyspnea somewhat. She was given furosemide 40 mg IV and consented for 1 U PRBCs, and TRH were asked to evaluate for admission.  She has never had a colonoscopy, denies melena or hematochezia or vaginal bleeding.  Her last echocardiogram was in 2011, with EF 55% and AV area 1.25, none since and does not follow with Cardiology, has never had valve repair.         Review of Systems:  All other systems negative except as just noted or noted in the history of present illness.    Past Medical History  Diagnosis Date  . Hypertension   . Thyroid disease       Past Surgical History  Procedure Laterality Date  . 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 Hernia repair    . Right leg cellulitis surgery     Social History: Patient lives with her two sons and husband.  She is a retired Midwife. She is a smoker, quit greater than 20 years ago.    No Known Allergies  Family history: family history includes Cardiomyopathy in her son; Emphysema in her father; Heart attack in her mother; Lung cancer in her maternal aunt.  Prior to Admission medications   Medication Sig Start Date End Date Taking? Authorizing Provider  aspirin 325 MG tablet Take 325 mg by mouth every 6 (six) hours as needed for moderate pain.   Yes Historical Provider, MD  benazepril (LOTENSIN) 20 MG tablet Take 20 mg by mouth daily.   Yes Historical Provider, MD  furosemide (LASIX) 40 MG tablet Take 40 mg by mouth daily.   Yes Historical Provider, MD  guaiFENesin (MUCINEX) 600 MG 12 hr tablet Take 600 mg by mouth 2 (two) times daily as needed for cough or to loosen phlegm.   Yes Historical Provider, MD  ibuprofen (ADVIL,MOTRIN) 600 MG tablet Take 600 mg by mouth every 6 (six) hours as needed for moderate pain.   Yes Historical Provider, MD  levothyroxine (SYNTHROID, LEVOTHROID) 175 MCG tablet Take 175  mcg by mouth daily before breakfast.   Yes Historical Provider, MD  Phenylephrine-APAP-Guaifenesin (MUCINEX FAST-MAX) 10-650-400 MG/20ML LIQD Take 30 mLs by mouth daily as needed (for cough).   Yes Historical Provider, MD  potassium chloride SA (K-DUR,KLOR-CON) 20 MEQ tablet Take 20 mEq by mouth daily.   Yes Historical Provider, MD  zolpidem (AMBIEN) 5 MG tablet Take 5 mg by mouth at bedtime as needed for sleep.   Yes Historical Provider, MD       Physical Exam: BP 139/49 mmHg  Pulse 110  Temp(Src) 99.4 F (37.4 C) (Rectal)  Resp 37  SpO2 100%  LMP  (LMP Unknown) General appearance: Obese adult female, alert and in no acute distress, lying flat  in bed, somewhat dyspneic.   Eyes: Anicteric, conjunctiva pink, lids and lashes normal.     ENT: No nasal deformity, discharge, or epistaxis.  OP moist without lesions.  Edentulous. Lymph: No cervical or supraclavicular lymphadenopathy. Skin: Warm and dry.  No mottling. Cardiac: Tachycardic, regular, nl S1-S2, harsh 2-3/6 SEM, late peaking.  Capillary refill is brisk.  JVP not visible.  No pitting LE edema.  Radial and DP pulses 2+ and symmetric. Respiratory: Normal respiratory rate and rhythm.  CTAB without rales or wheezes appreciated. Abdomen: Abdomen soft without rigidity.  No TTP. No ascites, distension.   MSK: There is old scars on the left calf and skin deformities around it.  There is chronic venous stasis changes bilateally.  On the RIGHT ankle, there is an area of circumferential redness, warmth and increased tenderness compared to the left. Neuro: Cranial nerves normal. Sensorium intact and responding to questions, attention normal.  Speech is fluent.  Moves all extremities equally and with normal coordination.    Psych: Behavior appropriate.  Affect pleasant.  No evidence of aural or visual hallucinations or delusions.       Labs on Admission:  I have personally reviewed the following studies: The metabolic panel shows normal left lites and renal function. FOBT positive. Troponin negative. BNP 291 pg per mL. D-dimer greater than 3.  Coags normal. The complete blood count shows mild leukocytosis, hemoglobin 7.2, microcytic, elevated RDW, no thrombocytopenia.   Radiological Exams on Admission: Personally reviewed and feel that motion degradation of images may prevent ruling out pulmonary edema: Ct Angio Chest Pe W/cm &/or Wo Cm  11/11/2015  CLINICAL DATA:  Dyspnea, onset 1 week ago. Productive cough for 3 weeks. Bilateral lower extremity swelling, worsened over the past 2 days. EXAM: CT ANGIOGRAPHY CHEST WITH CONTRAST TECHNIQUE: Multidetector CT imaging of the chest was  performed using the standard protocol during bolus administration of intravenous contrast. Multiplanar CT image reconstructions and MIPs were obtained to evaluate the vascular anatomy. CONTRAST:  80 mL Isovue 370 intravenous COMPARISON:  None. FINDINGS: Cardiovascular: There is good opacification of the pulmonary arteries. There is no pulmonary embolism. The thoracic aorta is normal in caliber and intact. There is enlargement of all cardiac chambers. Lungs: Mild linear basilar scarring or atelectasis. Otherwise clear. Central airways: Patent Effusions: None Lymphadenopathy: None Esophagus: Unremarkable Upper abdomen: Small hiatal hernia Musculoskeletal: No significant skeletal lesion. Review of the MIP images confirms the above findings. Mild motion degradation of the images. IMPRESSION: Negative for acute pulmonary embolism. There is a small hiatal hernia. There is enlargement of all cardiac chambers. Mild motion degradation of the images. Electronically Signed   By: Ellery Plunk M.D.   On: 11/11/2015 02:28    EKG: Independently reviewed. Rate 104, RBBB, no ischemic changes, QTc  526, no previous for comparison.   Echocardiogram 2011: EF 55%, AS with valve area 1.25 cm^2     Assessment/Plan 1. Dyspnea:  Possibly symptomatic anemia.  Differential also includes progression of AS.  COPD possible, influenza/bronchospasm given response to albuterol, but this seems less likely.  PE and ILD seem to have been ruled out with CTA.   -Echo ordered -Transfuse 1 unit and recheck H/H   2. Anemia:  FOBT positive, no previous colonoscopy, no vaginal bleeding.  Will check iron studies.   -Check ferritin, iron studies, reticulocytes -Check LDH, haptoglobin, indirect Bili -Start iron -With or without evidence of shearing, colonoscopy as outpatient  3. Hypothyroidism:  -Continue levothyroxine -Check TSH  4. HTN:  -Continue benazepril and furosemide  5. Aortic stenosis, bicuspid valve:  -Echo  ordered -Check blood cultures given low grade fever  6. Leg cellulitis:  -Keflex for mild cellulitis, incidentally found  7. Prolonged QTc:      DVT prophylaxis: SCDs  Code Status: Full  Family Communication: Children and husband at bedside  Disposition Plan: Anticipate echo and transfusion.  Reassess status.  If progression of AS, will consult Cards/CT surg.  Otherwise, monitor response to transfusion and plan for colonoscopy as outpatient. Medical decision making and consults: Patient seen at 4:00 AM on 11/11/2015.  The patient was discussed with Dr. Elesa MassedWard. I recommend admission to telemetry, observation status for dyspnea, unclear cause.  Clinical condition: stable at present.      Alberteen SamChristopher P Tajah Schreiner [facility] Pager [phone]

## 2015-11-11 NOTE — Progress Notes (Signed)
11:18 AM I agree with HPI/GPe and A/P per Dr. Maryfrances Bunnellanford       Patient Active Problem List   Diagnosis Date Noted  . Symptomatic anemia-Patient allegedly has a very difficult to match blood type and it may be hard to get him transfused today. Given his iron and T7 are low, he would benefit from iron transfusion and this would help him in the short-term Etiology of anemia is unclear-but with an MCV of 67 it is likely that this is secondary to anemia of pure iron deficiency and patient might benefit from Hemoccults which will be ordered today.  We might need GI input moving forward  11/11/2015  . Bicuspid aortic valve-echocardiogram has been ordered. We will follow. Unless gradient is high for valve area is diminished patient would not benefit fromValve repair. Less common causes of anemia such as myelophthisic anemia, possible  11/11/2015  . Dyspnea-2/2 anemia.  11/11/2015  . Prolonged QT interval-monitor on tele 11/11/2015  . Hypothyroidism-TSH however this admission is 0.81 therefore we will adjust the dosage of her thyroxine downward given patient actually may have hyperthyroidism secondary to iatrogenic causes.  Levothyroixine 175-->100 mcg 11/11/2015 09/24/2009  . Essential hypertension- 09/24/2009  . Aortic valve disorder 09/24/2009  . DEEP VENOUS THROMBOPHLEBITIS, LEG, LEFT 09/24/2009  . CELLULITIS, LEG, RIGHT-monitor on kelfex-low threshold to d/c in setting ? AECHF  09/24/2009   [name], MD [facility] [phone]) [phone]

## 2015-11-12 ENCOUNTER — Observation Stay (HOSPITAL_COMMUNITY): Payer: BC Managed Care – PPO

## 2015-11-12 ENCOUNTER — Observation Stay (HOSPITAL_BASED_OUTPATIENT_CLINIC_OR_DEPARTMENT_OTHER): Payer: BC Managed Care – PPO

## 2015-11-12 ENCOUNTER — Encounter (HOSPITAL_COMMUNITY): Payer: Medicare Other

## 2015-11-12 DIAGNOSIS — I82401 Acute embolism and thrombosis of unspecified deep veins of right lower extremity: Secondary | ICD-10-CM | POA: Diagnosis not present

## 2015-11-12 DIAGNOSIS — R0600 Dyspnea, unspecified: Secondary | ICD-10-CM | POA: Diagnosis not present

## 2015-11-12 LAB — COMPREHENSIVE METABOLIC PANEL
ALBUMIN: 3.1 g/dL — AB (ref 3.5–5.0)
ALT: 23 U/L (ref 14–54)
AST: 27 U/L (ref 15–41)
Alkaline Phosphatase: 67 U/L (ref 38–126)
Anion gap: 9 (ref 5–15)
BUN: 8 mg/dL (ref 6–20)
CHLORIDE: 103 mmol/L (ref 101–111)
CO2: 25 mmol/L (ref 22–32)
CREATININE: 0.74 mg/dL (ref 0.44–1.00)
Calcium: 8.9 mg/dL (ref 8.9–10.3)
GFR calc Af Amer: >60 mL/min (ref >60)
GLUCOSE: 113 mg/dL — AB (ref 65–99)
Potassium: 3.4 mmol/L — ABNORMAL LOW (ref 3.5–5.1)
Sodium: 137 mmol/L (ref 135–145)
Total Bilirubin: 1.1 mg/dL (ref 0.3–1.2)
Total Protein: 6.5 g/dL (ref 6.5–8.1)

## 2015-11-12 LAB — CBC WITH DIFFERENTIAL/PLATELET
Basophils Absolute: 0 10*3/uL (ref 0.0–0.1)
Basophils Relative: 0 %
EOS PCT: 3 %
Eosinophils Absolute: 0.3 10*3/uL (ref 0.0–0.7)
HEMATOCRIT: 27.7 % — AB (ref 36.0–46.0)
Hemoglobin: 7.6 g/dL — ABNORMAL LOW (ref 12.0–15.0)
Lymphocytes Relative: 26 %
Lymphs Abs: 2.6 10*3/uL (ref 0.7–4.0)
MCH: 18.6 pg — ABNORMAL LOW (ref 26.0–34.0)
MCHC: 27.4 g/dL — ABNORMAL LOW (ref 30.0–36.0)
MCV: 67.7 fL — AB (ref 78.0–100.0)
MONOS PCT: 9 %
Monocytes Absolute: 0.9 10*3/uL (ref 0.1–1.0)
NEUTROS PCT: 62 %
Neutro Abs: 6.3 10*3/uL (ref 1.7–7.7)
PLATELETS: 165 10*3/uL (ref 150–400)
RBC: 4.09 MIL/uL (ref 3.87–5.11)
RDW: 18.6 % — ABNORMAL HIGH (ref 11.5–15.5)
WBC: 10.1 10*3/uL (ref 4.0–10.5)

## 2015-11-12 LAB — PROTIME-INR
INR: 1.34 (ref 0.00–1.49)
PROTHROMBIN TIME: 16.7 s — AB (ref 11.6–15.2)

## 2015-11-12 LAB — PREPARE RBC (CROSSMATCH)

## 2015-11-12 MED ORDER — FUROSEMIDE 10 MG/ML IJ SOLN
20.0000 mg | Freq: Once | INTRAMUSCULAR | Status: AC
  Administered 2015-11-12: 20 mg via INTRAVENOUS
  Filled 2015-11-12: qty 2

## 2015-11-12 MED ORDER — DIPHENHYDRAMINE HCL 25 MG PO CAPS
25.0000 mg | ORAL_CAPSULE | Freq: Once | ORAL | Status: AC
  Administered 2015-11-12: 25 mg via ORAL
  Filled 2015-11-12: qty 1

## 2015-11-12 MED ORDER — SODIUM CHLORIDE 0.9 % IV SOLN
Freq: Once | INTRAVENOUS | Status: AC
  Administered 2015-11-12: 09:00:00 via INTRAVENOUS

## 2015-11-12 NOTE — Progress Notes (Signed)
PROGRESS NOTE    Christine PattenJudith M Hicks  WUJ:[id number] DOB: 12/15/1955 DOA: 11/10/2015 PCP: Billee CashingMCKENZIE, Christine Hicks  Outpatient Specialists:  None as yet   Brief Narrative:  [age] ? bicuspid AV,  HTN hypothyroidism    Recent cold/cough-subacute presentation  With worsning  [facility]   In ed  99.5 F, pr 100, BP 140/45 mmHg.  Na 135, K 3.7, Cr 0.7, WBC 10.7K, Hgb 7.2, FOBT positive. BNP 391 pg/mL.  ECG showed sinus tachycardia, long QTC, RBBB. CTA without infiltrates or PE.  given furosemide 40 mg IV and consented for 1 U PRBCs [kidd B ab-hard to x-match IV iron x 1  Diuresing-transfusiing  Assessment & Plan:   Patient Active Problem List     Diagnosis  Date Noted   .  Symptomatic anemia -Patient difficult to match blood type  -iron and Tsat are low, Fe Iv given Etiology of anemia is unclear-but with an MCV of 67 it is likely that this is secondary to anemia of pure iron deficiency and patient might benefit from Hemoccults which will be ordered today.   -transfusing 2nd unit 11:43 AM--expect hb ion am to be higher 11/11/2015    Body mass index is 45.41 kg/(m^2).]--need OP counseling with nutrition/not a clear candidate for bariatrics given multiple prior abd surgeries   .  Bicuspid aortic valve-echocardiogram has been ordered. We will follow. Unless gradient is high for valve area is diminished patient would not benefit from Valve repair. Less common causes of anemia such as myelophthisic anemia, possible   11/11/2015   .  Dyspnea-2/2 anemia.   11/11/2015   .  Prolonged QT interval-monitor on tele  11/11/2015   .  Hypothyroidism-TSH however this admission is 0.81 therefore we will adjust the dosage of her thyroxine downward given patient actually may have hyperthyroidism secondary to iatrogenic causes.  Levothyroixine 175-->100 mcg 11/11/2015  09/24/2009   .  Essential hypertension-  09/24/2009   .  Aortic valve disorder  09/24/2009   .  DEEP VENOUS THROMBOPHLEBITIS, LEG, R  ?? When-getduplex LE's-risk facotrs Body mass index is 45.41 kg/(m^2)., female 09/24/2009   .  CELLULITIS, LEG, RIGHT-monitor on kelfex- d/c.11/12/2015    09/24/2009      DVT prophylaxis: SCD's Code Status: Full Family Communication:  None Disposition Plan: ? Not sure-await echo, doppler, resolution med issues   Consultants:   None yet  Procedures:   None yet  Antimicrobials:   none    Subjective:  Well no n/v/cp No dark stool Eating fair bil Le pains No CP No fever wonders about chf/asthma  Tells me used to smoke 20 yrs prior  Objective: Filed Vitals:   11/12/15 0543 11/12/15 0546 11/12/15 1019 11/12/15 1047  BP:  148/66 127/57 121/45  Pulse:  90 100 88  Temp:  98.6 F (37 C) 98.2 F (36.8 C) 98.6 F (37 C)  TempSrc:  Oral Oral Oral  Resp:  18 18 18   Height:      Weight: 123.787 kg (272 lb 14.4 oz)     SpO2:  99% 97% 98%    Intake/Output Summary (Last 24 hours) at 11/12/15 1119 Last data filed at 11/12/15 0546  Gross per 24 hour  Intake    335 ml  Output   3000 ml  Net  -2665 ml   Filed Weights   11/11/15 0500 11/12/15 0543  Weight: 125.7 kg (277 lb 1.9 oz) 123.787 kg (272 lb 14.4 oz)    Examination:  General exam: Appears  calm, Body mass index is 45.41 kg/(m^2). Respiratory system: Clear to auscultation. Respiratory effort normal. Cardiovascular system: S1 & S2 heard, RRR. No JVD, murmurs, rubs, gallops or clicks. No pedal edema. Gastrointestinal system: Abdomen is nondistended, soft and nontender. No organomegaly or masses felt. Normal bowel sounds heard. Central nervous system: Alert and oriented. No focal neurological deficits. Extremities: Symmetric 5 x 5 power. Skin: No rashes, lesions or ulcers Psychiatry: Judgement and insight appear normal. Mood & affect appropriate.     Data Reviewed: I have personally reviewed following labs and imaging studies  CBC:  Recent Labs Lab 11/10/15 2350 11/11/15 2213 11/12/15 0514  WBC 10.7*   --  10.1  NEUTROABS 6.8  --  6.3  HGB 7.2* 7.6* 7.6*  HCT 28.0* 28.6* 27.7*  MCV 67.0*  --  67.7*  PLT 183  --  165   Basic Metabolic Panel:  Recent Labs Lab 11/10/15 2350 11/12/15 0514  NA 135 137  K 3.7 3.4*  CL 105 103  CO2 20* 25  GLUCOSE 113* 113*  BUN 9 8  CREATININE 0.70 0.74  CALCIUM 8.7* 8.9   GFR: Estimated Creatinine Clearance: 97.6 mL/min (by C-G formula based on Cr of 0.74). Liver Function Tests:  Recent Labs Lab 11/11/15 0548 11/12/15 0514  AST  --  27  ALT  --  23  ALKPHOS  --  67  BILITOT 0.6 1.1  PROT  --  6.5  ALBUMIN  --  3.1*   No results for input(s): LIPASE, AMYLASE in the last 168 hours. No results for input(s): AMMONIA in the last 168 hours. Coagulation Profile:  Recent Labs Lab 11/10/15 2350 11/12/15 0514  INR 1.18 1.34   Cardiac Enzymes: No results for input(s): CKTOTAL, CKMB, CKMBINDEX, TROPONINI in the last 168 hours. BNP (last 3 results) No results for input(s): PROBNP in the last 8760 hours. HbA1C: No results for input(s): HGBA1C in the last 72 hours. CBG: No results for input(s): GLUCAP in the last 168 hours. Lipid Profile: No results for input(s): CHOL, HDL, LDLCALC, TRIG, CHOLHDL, LDLDIRECT in the last 72 hours. Thyroid Function Tests:  Recent Labs  11/11/15 0548  TSH 0.180*   Anemia Panel:  Recent Labs  11/11/15 0548  FERRITIN 8*  TIBC 552*  IRON 10*  RETICCTPCT 2.3   Urine analysis:    Component Value Date/Time   COLORURINE YELLOW 06/22/2010 2055   APPEARANCEUR HAZY* 06/22/2010 2055   LABSPEC 1.010 06/22/2010 2055   PHURINE 5.0 06/22/2010 2055   GLUCOSEU NEGATIVE 06/22/2010 2055   HGBUR NEGATIVE 06/22/2010 2055   BILIRUBINUR NEGATIVE 06/22/2010 2055   KETONESUR NEGATIVE 06/22/2010 2055   PROTEINUR NEGATIVE 06/22/2010 2055   UROBILINOGEN 0.2 06/22/2010 2055   NITRITE NEGATIVE 06/22/2010 2055   LEUKOCYTESUR  06/22/2010 2055    NEGATIVE MICROSCOPIC NOT DONE ON URINES WITH NEGATIVE PROTEIN, BLOOD,  LEUKOCYTES, NITRITE, OR GLUCOSE <1000 mg/dL.      Radiology Studies: Dg Chest 2 View  11/12/2015  CLINICAL DATA:  60 year old female with history of shortness breath for the past 2 weeks. Congestive heart failure. EXAM: CHEST  2 VIEW COMPARISON:  Chest x-ray 09/18/2009. FINDINGS: Lung volumes are normal. No consolidative airspace disease. No pleural effusions. Mild cephalization of the pulmonary vasculature, without frank pulmonary edema. No pneumothorax. No suspicious appearing pulmonary nodules or masses. Heart size is mildly enlarged. Upper mediastinal contours are within normal limits. Atherosclerosis in the thoracic aorta. IMPRESSION: 1. Mild cardiomegaly with pulmonary venous congestion, but no frank pulmonary edema. 2.  Atherosclerosis. Electronically Signed   By: Trudie Reed M.D.   On: 11/12/2015 08:02   Ct Angio Chest Pe W/cm &/or Wo Cm  11/11/2015  CLINICAL DATA:  Dyspnea, onset 1 week ago. Productive cough for 3 weeks. Bilateral lower extremity swelling, worsened over the past 2 days. EXAM: CT ANGIOGRAPHY CHEST WITH CONTRAST TECHNIQUE: Multidetector CT imaging of the chest was performed using the standard protocol during bolus administration of intravenous contrast. Multiplanar CT image reconstructions and MIPs were obtained to evaluate the vascular anatomy. CONTRAST:  80 mL Isovue 370 intravenous COMPARISON:  None. FINDINGS: Cardiovascular: There is good opacification of the pulmonary arteries. There is no pulmonary embolism. The thoracic aorta is normal in caliber and intact. There is enlargement of all cardiac chambers. Lungs: Mild linear basilar scarring or atelectasis. Otherwise clear. Central airways: Patent Effusions: None Lymphadenopathy: None Esophagus: Unremarkable Upper abdomen: Small hiatal hernia Musculoskeletal: No significant skeletal lesion. Review of the MIP images confirms the above findings. Mild motion degradation of the images. IMPRESSION: Negative for acute pulmonary  embolism. There is a small hiatal hernia. There is enlargement of all cardiac chambers. Mild motion degradation of the images. Electronically Signed   By: Ellery Plunk M.D.   On: 11/11/2015 02:28        Scheduled Meds: . sodium chloride  10 mL/hr Intravenous Once  . benazepril  20 mg Oral Daily  . cephALEXin  500 mg Oral 4 times per day  . ferrous sulfate  325 mg Oral BID WC  . furosemide  40 mg Oral Daily  . levothyroxine  100 mcg Oral QAC breakfast  . potassium chloride SA  20 mEq Oral Daily   Continuous Infusions:       Time spent: 56    Pleas Koch, Hicks [facility] ([facility]   If 7PM-7AM, please contact night-coverage [email or web address] Password [email or web address] 11/12/2015, 11:19 AM

## 2015-11-12 NOTE — Progress Notes (Addendum)
VASCULAR LAB PRELIMINARY  PRELIMINARY  PRELIMINARY  PRELIMINARY  Bilateral lower extremity venous duplex  completed.    Preliminary report:  Right:  Acute, non occlusive DVT noted in the proximal CFV.  Superficial thrombosis noted throughout the GSV in the thigh.  No Baker's cyst.  Left:  No evidence of DVT, superficial thrombosis, or Baker's cyst.  Christine BumpsJessica, RN notified of results.  Christine Hicks, RVT 11/12/2015, 4:51 PM

## 2015-11-12 NOTE — Progress Notes (Signed)
Dr Mahala Menghinisamtani stated to just draw H and H in the morning on 11/13/15

## 2015-11-12 NOTE — Care Management Note (Addendum)
Case Management Note  Patient Details  Name: Christine Hicks MRN: [id number] Date of Birth: 12/04/1955  Subjective/Objective:         Presents with dyspnea/ ? symptomatic anemia. From home with husband/son. Independent with ADL's PTA. Uses a cane with ambulation. PCP: Billee CashingWayland McKenzie.         Action/Plan: Return to home when medically stable. CM to f/u with disposition needs.  Expected Discharge Date:                  Expected Discharge Plan:  Home/Self Care (Lives with husband/son)  In-House Referral:     Discharge planning Services  CM Consult  Post Acute Care Choice:    Choice offered to:     DME Arranged:    DME Agency:     HH Arranged:    HH Agency:     Status of Service:  In process, will continue to follow  Medicare Important Message Given:    Date Medicare IM Given:    Medicare IM give by:    Date Additional Medicare IM Given:    Additional Medicare Important Message give by:     If discussed at Long Length of Stay Meetings, dates discussed:    Additional Comments: CM spoke with pt regarding discharge planning. Pt stated if home health services are needed [facility]  [facility]  Gae GallopCole, Hassel Uphoff Dover[place]udson, [place]RN,BSN,CM [phone] 11/12/2015, 12:38 PM

## 2015-11-12 NOTE — Progress Notes (Signed)
Discussed with patient about bleeding and making sure that she knows to report any bleeding to the nurse.  At this time patient stated that she had a BM last night but no blood was noted.  She also stated that she has not had any other bleeding elsewhere.

## 2015-11-13 ENCOUNTER — Other Ambulatory Visit (HOSPITAL_COMMUNITY): Payer: Medicare Other

## 2015-11-13 DIAGNOSIS — D509 Iron deficiency anemia, unspecified: Secondary | ICD-10-CM

## 2015-11-13 DIAGNOSIS — R195 Other fecal abnormalities: Secondary | ICD-10-CM | POA: Diagnosis not present

## 2015-11-13 DIAGNOSIS — D649 Anemia, unspecified: Secondary | ICD-10-CM

## 2015-11-13 DIAGNOSIS — R0600 Dyspnea, unspecified: Secondary | ICD-10-CM | POA: Diagnosis not present

## 2015-11-13 LAB — COMPREHENSIVE METABOLIC PANEL
ALK PHOS: 69 U/L (ref 38–126)
ALT: 19 U/L (ref 14–54)
AST: 25 U/L (ref 15–41)
Albumin: 3.2 g/dL — ABNORMAL LOW (ref 3.5–5.0)
Anion gap: 11 (ref 5–15)
BILIRUBIN TOTAL: 0.9 mg/dL (ref 0.3–1.2)
BUN: 9 mg/dL (ref 6–20)
CALCIUM: 8.8 mg/dL — AB (ref 8.9–10.3)
CO2: 22 mmol/L (ref 22–32)
CREATININE: 0.69 mg/dL (ref 0.44–1.00)
Chloride: 105 mmol/L (ref 101–111)
Glucose, Bld: 101 mg/dL — ABNORMAL HIGH (ref 65–99)
Potassium: 3.6 mmol/L (ref 3.5–5.1)
Sodium: 138 mmol/L (ref 135–145)
TOTAL PROTEIN: 6.8 g/dL (ref 6.5–8.1)

## 2015-11-13 LAB — CBC
HEMATOCRIT: 31.6 % — AB (ref 36.0–46.0)
HEMOGLOBIN: 8.6 g/dL — AB (ref 12.0–15.0)
MCH: 19 pg — AB (ref 26.0–34.0)
MCHC: 27.2 g/dL — ABNORMAL LOW (ref 30.0–36.0)
MCV: 69.8 fL — AB (ref 78.0–100.0)
Platelets: 158 10*3/uL (ref 150–400)
RBC: 4.53 MIL/uL (ref 3.87–5.11)
RDW: 19.3 % — ABNORMAL HIGH (ref 11.5–15.5)
WBC: 11.3 10*3/uL — ABNORMAL HIGH (ref 4.0–10.5)

## 2015-11-13 LAB — PROTIME-INR
INR: 1.45 (ref 0.00–1.49)
PROTHROMBIN TIME: 17.7 s — AB (ref 11.6–15.2)

## 2015-11-13 LAB — PREPARE RBC (CROSSMATCH)

## 2015-11-13 LAB — HEPARIN LEVEL (UNFRACTIONATED): Heparin Unfractionated: 0.17 IU/mL — ABNORMAL LOW (ref 0.30–0.70)

## 2015-11-13 LAB — HAPTOGLOBIN: HAPTOGLOBIN: 60 mg/dL (ref 34–200)

## 2015-11-13 MED ORDER — SODIUM CHLORIDE 0.9 % IV SOLN: Freq: Once | INTRAVENOUS | Status: DC

## 2015-11-13 MED ORDER — HEPARIN (PORCINE) IN NACL 100-0.45 UNIT/ML-% IJ SOLN
1750.0000 [IU]/h | INTRAMUSCULAR | Status: AC
  Administered 2015-11-13: 1800 [IU]/h via INTRAVENOUS
  Administered 2015-11-13: 1550 [IU]/h via INTRAVENOUS
  Administered 2015-11-14: 1800 [IU]/h via INTRAVENOUS
  Administered 2015-11-15 (×2): 1750 [IU]/h via INTRAVENOUS
  Filled 2015-11-13 (×5): qty 250

## 2015-11-13 MED ORDER — FUROSEMIDE 40 MG PO TABS
40.0000 mg | ORAL_TABLET | Freq: Two times a day (BID) | ORAL | Status: DC
  Administered 2015-11-14 – 2015-11-18 (×8): 40 mg via ORAL
  Filled 2015-11-13 (×10): qty 1

## 2015-11-13 MED ORDER — HEPARIN BOLUS VIA INFUSION
4000.0000 [IU] | Freq: Once | INTRAVENOUS | Status: AC
  Administered 2015-11-13: 4000 [IU] via INTRAVENOUS
  Filled 2015-11-13: qty 4000

## 2015-11-13 NOTE — Progress Notes (Signed)
PROGRESS NOTE    Archie PattenJudith M Cadle  MWN:[id number] DOB: 07/11/1956 DOA: 11/10/2015 PCP: Billee CashingMCKENZIE, [name], MD  Outpatient Specialists:  None as yet   Brief Narrative:  60 ? bicuspid AV,  HTN hypothyroidism    Recent cold/cough-subacute presentation  With worsning  [facility]   In ed  99.5 F, pr 100, BP 140/45 mmHg.  Na 135, K 3.7, Cr 0.7, WBC 10.7K, Hgb 7.2, FOBT positive. BNP 391 pg/mL.  ECG showed sinus tachycardia, long QTC, RBBB. CTA without infiltrates or PE.  given furosemide 40 mg IV and consented for 1 U PRBCs [kidd B ab-hard to x-match IV iron x 1  Diuresing-transfusing  Found to eventually have a DVT in RLE-acute, non occlusive.  Started on IV heparin as well  Assessment & Plan:   Patient Active Problem List     Diagnosis  Date Noted   .  Symptomatic anemia leading to high output heart failure -Patient difficult to match blood type  -iron and Tsat are low, Fe Iv given -s/p 2 U PRBC 11/13/15 Etiology of anemia is unclear-but with an MCV of 67- likely 2/2 iron deficiency -Gi consulted 4/18 as might need colonoscopy -hemoglobin 7.6-->8.6 -typed and screened 2 u given difficult to x-match blood -lasix 40 bid from 4.18 11/11/2015    Body mass index is 45.4 kg/(m^2).]--need OP counseling with nutrition/not a clear candidate for bariatrics given multiple prior abd surgeries   .  Bicuspid aortic valve-echocardiogram has been ordered but is pending  -less common causes of anemia such as myelophthisic anemia, possible   11/11/2015   .  Dyspnea-2/2 anemia, habitus 11/11/2015   .  Prolonged QT interval-monitor on tele  11/11/2015   .  Hypothyroidism-TSH however this admission is 0.81 therefore we will adjust the dosage of her thyroxine downward given patient actually may have hyperthyroidism secondary to iatrogenic causes.  Levothyroixine 175-->100 mcg 11/11/2015  09/24/2009   .  Essential hypertension- cont benazepril 20 qd 09/24/2009   .  Impaired glu  tol-monitor-no need currently for anti-glycemic meds 09/24/2009   .  DEEP VENOUS THROMBOPHLEBITIS, LEG, R-duplex + for acute non-occlusive DVT- I was not informed of this result on 4/17 -long discussion with patient risks and benefits -will need life-long AC -suggest OP oncology input dependant on results colonoscopy 09/24/2009   .  CELLULITIS, LEG, RIGHT-monitor on kelfex- d/c.11/13/2015    09/24/2009      DVT prophylaxis: SCD's Code Status: Full Family Communication:  None Disposition Plan: ? Not sure-await echo, doppler, resolution med issues   Consultants:   None yet  Procedures:   None yet  Antimicrobials:   none    Subjective:  Well no n/v/cp No dark stool, no dysphagia Feels better    Objective: Filed Vitals:   11/12/15 2206 11/13/15 0151 11/13/15 0254 11/13/15 0428  BP: 128/51   111/41  Pulse: 98   84  Temp: 99.3 F (37.4 C) 98.8 F (37.1 C)  98.3 F (36.8 C)  TempSrc: Oral Oral  Oral  Resp: 18   18  Height:      Weight:   123.741 kg (272 lb 12.8 oz)   SpO2: 98%   97%    Intake/Output Summary (Last 24 hours) at 11/13/15 1046 Last data filed at 11/13/15 1033  Gross per 24 hour  Intake    735 ml  Output   1800 ml  Net  -1065 ml   Filed Weights   11/11/15 0500 11/12/15 0543 11/13/15 0254  Weight:  125.7 kg (277 lb 1.9 oz) 123.787 kg (272 lb 14.4 oz) 123.741 kg (272 lb 12.8 oz)    Examination:  General exam: Appears calm, Body mass index is 45.4 kg/(m^2). Respiratory system: Clear to auscultation. Respiratory effort normal. Cardiovascular system: S1 & S2 heard, RRR. No JVD, no murmurs Gastrointestinal system: Abdomen is nondistended, soft and nontender. No organomegaly or masses felt. Normal bowel sounds heard. Central nervous system: Alert and oriented. No focal neurological deficits. Extremities: Symmetric 5 x 5 power.  Pain in both calves bilaterally Skin: No rashes, lesions or ulcers Psychiatry: Judgement and insight appear normal. Mood  & affect appropriate.     Data Reviewed: I have personally reviewed following labs and imaging studies  CBC:  Recent Labs Lab 11/10/15 2350 11/11/15 2213 11/12/15 0514 11/13/15 0640  WBC 10.7*  --  10.1 11.3*  NEUTROABS 6.8  --  6.3  --   HGB 7.2* 7.6* 7.6* 8.6*  HCT 28.0* 28.6* 27.7* 31.6*  MCV 67.0*  --  67.7* 69.8*  PLT 183  --  165 158   Basic Metabolic Panel:  Recent Labs Lab 11/10/15 2350 11/12/15 0514 11/13/15 0640  NA 135 137 138  K 3.7 3.4* 3.6  CL 105 103 105  CO2 20* 25 22  GLUCOSE 113* 113* 101*  BUN CREATININE 0.70 0.74 0.69  CALCIUM 8.7* 8.9 8.8*   GFR: Estimated Creatinine Clearance: 97.6 mL/min (by C-G formula based on Cr of 0.69). Liver Function Tests:  Recent Labs Lab 11/11/15 0548 11/12/15 0514 11/13/15 0640  AST  --  27 25  ALT  --  23 19  ALKPHOS  --  67 69  BILITOT 0.6 1.1 0.9  PROT  --  6.5 6.8  ALBUMIN  --  3.1* 3.2*   No results for input(s): LIPASE, AMYLASE in the last 168 hours. No results for input(s): AMMONIA in the last 168 hours. Coagulation Profile:  Recent Labs Lab 11/10/15 2350 11/12/15 0514 11/13/15 0640  INR 1.18 1.34 1.45   Cardiac Enzymes: No results for input(s): CKTOTAL, CKMB, CKMBINDEX, TROPONINI in the last 168 hours. BNP (last 3 results) No results for input(s): PROBNP in the last 8760 hours. HbA1C: No results for input(s): HGBA1C in the last 72 hours. CBG: No results for input(s): GLUCAP in the last 168 hours. Lipid Profile: No results for input(s): CHOL, HDL, LDLCALC, TRIG, CHOLHDL, LDLDIRECT in the last 72 hours. Thyroid Function Tests:  Recent Labs  11/11/15 0548  TSH 0.180*   Anemia Panel:  Recent Labs  11/11/15 0548  FERRITIN 8*  TIBC 552*  IRON 10*  RETICCTPCT 2.3   Urine analysis:    Component Value Date/Time   COLORURINE YELLOW 06/22/2010 2055   APPEARANCEUR HAZY* 06/22/2010 2055   LABSPEC 1.010 06/22/2010 2055   PHURINE 5.0 06/22/2010 2055   GLUCOSEU NEGATIVE  06/22/2010 2055   HGBUR NEGATIVE 06/22/2010 2055   BILIRUBINUR NEGATIVE 06/22/2010 2055   KETONESUR NEGATIVE 06/22/2010 2055   PROTEINUR NEGATIVE 06/22/2010 2055   UROBILINOGEN 0.2 06/22/2010 2055   NITRITE NEGATIVE 06/22/2010 2055   LEUKOCYTESUR  06/22/2010 2055    NEGATIVE MICROSCOPIC NOT DONE ON URINES WITH NEGATIVE PROTEIN, BLOOD, LEUKOCYTES, NITRITE, OR GLUCOSE <1000 mg/dL.      Radiology Studies: Dg Chest 2 View  11/12/2015  CLINICAL DATA:  60 year old female with history of shortness breath for the past 2 weeks. Congestive heart failure. EXAM: CHEST  2 VIEW COMPARISON:  Chest x-ray 09/18/2009. FINDINGS: Lung volumes are normal.  No consolidative airspace disease. No pleural effusions. Mild cephalization of the pulmonary vasculature, without frank pulmonary edema. No pneumothorax. No suspicious appearing pulmonary nodules or masses. Heart size is mildly enlarged. Upper mediastinal contours are within normal limits. Atherosclerosis in the thoracic aorta. IMPRESSION: 1. Mild cardiomegaly with pulmonary venous congestion, but no frank pulmonary edema. 2. Atherosclerosis. Electronically Signed   By: Trudie Reed M.D.   On: 11/12/2015 08:02        Scheduled Meds: . sodium chloride  10 mL/hr Intravenous Once  . benazepril  20 mg Oral Daily  . ferrous sulfate  325 mg Oral BID WC  . furosemide  40 mg Oral Daily  . levothyroxine  100 mcg Oral QAC breakfast  . potassium chloride SA  20 mEq Oral Daily   Continuous Infusions: . heparin 1,550 Units/hr (11/13/15 0955)        Time spent: 40    Pleas Koch, MD [facility] ([facility]   If 7PM-7AM, please contact night-coverage [email or web address] Password [facility] 11/13/2015, 10:46 AM

## 2015-11-13 NOTE — Progress Notes (Signed)
ANTICOAGULATION CONSULT NOTE   Pharmacy Consult for heparin Indication: acute R DVT  No Known Allergies  Patient Measurements: Height: 5\' 5"  (165.1 cm) Weight: 272 lb 12.8 oz (123.741 kg) IBW/kg (Calculated) : 57 Heparin Dosing Weight: 87 kg  Vital Signs: Temp: 98.1 F (36.7 C) (04/18 1629) Temp Source: Oral (04/18 1629) BP: 110/43 mmHg (04/18 1348) Pulse Rate: 95 (04/18 1348)  Labs:  Recent Labs  11/10/15 2350 11/11/15 2213 11/12/15 0514 11/13/15 0640 11/13/15 1628  HGB 7.2* 7.6* 7.6* 8.6*  --   HCT 28.0* 28.6* 27.7* 31.6*  --   PLT 183  --  165 158  --   APTT 33  --   --   --   --   LABPROT 15.2  --  16.7* 17.7*  --   INR 1.18  --  1.34 1.45  --   HEPARINUNFRC  --   --   --   --  0.17*  CREATININE 0.70  --  0.74 0.69  --     Estimated Creatinine Clearance: 97.6 mL/min (by C-G formula based on Cr of 0.69).   Medical History: Past Medical History  Diagnosis Date  . Hypertension   . Thyroid disease     Medications:  Prescriptions prior to admission  Medication Sig Dispense Refill Last Dose  . aspirin 325 MG tablet Take 325 mg by mouth every 6 (six) hours as needed for moderate pain.   Past Month at Unknown time  . benazepril (LOTENSIN) 20 MG tablet Take 20 mg by mouth daily.   11/10/2015 at Unknown time  . furosemide (LASIX) 40 MG tablet Take 40 mg by mouth daily.   11/10/2015 at Unknown time  . guaiFENesin (MUCINEX) 600 MG 12 hr tablet Take 600 mg by mouth 2 (two) times daily as needed for cough or to loosen phlegm.   11/10/2015 at Unknown time  . ibuprofen (ADVIL,MOTRIN) 600 MG tablet Take 600 mg by mouth every 6 (six) hours as needed for moderate pain.   Past Month at Unknown time  . levothyroxine (SYNTHROID, LEVOTHROID) 175 MCG tablet Take 175 mcg by mouth daily before breakfast.   11/10/2015 at Unknown time  . Phenylephrine-APAP-Guaifenesin (MUCINEX FAST-MAX) 10-650-400 MG/20ML LIQD Take 30 mLs by mouth daily as needed (for cough).   11/10/2015 at Unknown time   . potassium chloride SA (K-DUR,KLOR-CON) 20 MEQ tablet Take 20 mEq by mouth daily.   11/10/2015 at Unknown time  . zolpidem (AMBIEN) 5 MG tablet Take 5 mg by mouth at bedtime as needed for sleep.   Past Week at Unknown time    Assessment: 60 y/o obese female admitted on 11/10/2015 with progressive dyspnea and edema in LE. She was found to have symptomatic anemia as evidenced by her low Hb, iron, and Tsat. She reports no bleeding although FOB was positive on 4/16. Bilateral LE venous duplex reporting acute R DVT. CTA chest negative for PE. Pharmacy consulted to begin heparin at low dose due to anemia.   Although low, Hb stable in 7-8's, platelets are normal. Renal function is normal.  Initial heparin level low at 0.17  Goal of Therapy:  Heparin level 0.3-0.5 units/ml due to anemia Monitor platelets by anticoagulation protocol: Yes   Plan:  - Heparin to 1800 units / hr - 6 hr HL - Daily HL and CBC - Monitor for s/sx of bleeding  Thank you Okey RegalLisa Francois Elk, PharmD [phone]  11/13/2015 5:49 PM

## 2015-11-13 NOTE — Progress Notes (Signed)
ANTICOAGULATION CONSULT NOTE - Initial Consult  Pharmacy Consult for heparin Indication: acute R DVT  No Known Allergies  Patient Measurements: Height: 5\' 5"  (165.1 cm) Weight: 272 lb 12.8 oz (123.741 kg) IBW/kg (Calculated) : 57 Heparin Dosing Weight: 87 kg  Vital Signs: Temp: 98.3 F (36.8 C) (04/18 0428) Temp Source: Oral (04/18 0428) BP: 111/41 mmHg (04/18 0428) Pulse Rate: 84 (04/18 0428)  Labs:  Recent Labs  11/10/15 2350 11/11/15 2213 11/12/15 0514 11/13/15 0640  HGB 7.2* 7.6* 7.6* 8.6*  HCT 28.0* 28.6* 27.7* 31.6*  PLT 183  --  165 158  APTT 33  --   --   --   LABPROT 15.2  --  16.7* 17.7*  INR 1.18  --  1.34 1.45  CREATININE 0.70  --  0.74 0.69    Estimated Creatinine Clearance: 97.6 mL/min (by C-G formula based on Cr of 0.69).   Medical History: Past Medical History  Diagnosis Date  . Hypertension   . Thyroid disease     Medications:  Prescriptions prior to admission  Medication Sig Dispense Refill Last Dose  . aspirin 325 MG tablet Take 325 mg by mouth every 6 (six) hours as needed for moderate pain.   Past Month at Unknown time  . benazepril (LOTENSIN) 20 MG tablet Take 20 mg by mouth daily.   11/10/2015 at Unknown time  . furosemide (LASIX) 40 MG tablet Take 40 mg by mouth daily.   11/10/2015 at Unknown time  . guaiFENesin (MUCINEX) 600 MG 12 hr tablet Take 600 mg by mouth 2 (two) times daily as needed for cough or to loosen phlegm.   11/10/2015 at Unknown time  . ibuprofen (ADVIL,MOTRIN) 600 MG tablet Take 600 mg by mouth every 6 (six) hours as needed for moderate pain.   Past Month at Unknown time  . levothyroxine (SYNTHROID, LEVOTHROID) 175 MCG tablet Take 175 mcg by mouth daily before breakfast.   11/10/2015 at Unknown time  . Phenylephrine-APAP-Guaifenesin (MUCINEX FAST-MAX) 10-650-400 MG/20ML LIQD Take 30 mLs by mouth daily as needed (for cough).   11/10/2015 at Unknown time  . potassium chloride SA (K-DUR,KLOR-CON) 20 MEQ tablet Take 20 mEq by  mouth daily.   11/10/2015 at Unknown time  . zolpidem (AMBIEN) 5 MG tablet Take 5 mg by mouth at bedtime as needed for sleep.   Past Week at Unknown time    Assessment: 60 y/o obese female admitted on 11/10/2015 with progressive dyspnea and edema in LE. She was found to have symptomatic anemia as evidenced by her low Hb, iron, and Tsat. She reports no bleeding although FOB was positive on 4/16. Bilateral LE venous duplex reporting acute R DVT. CTA chest negative for PE. Pharmacy consulted to begin heparin at low dose due to anemia.   Although low, Hb stable in 7-8's, platelets are normal. Renal function is normal.  Goal of Therapy:  Heparin level 0.3-0.5 units/ml due to anemia Monitor platelets by anticoagulation protocol: Yes   Plan:  - Heparin 4000 unit IV bolus then infuse at 1550 units/hr - 6 hr HL - Daily HL and CBC - Monitor for s/sx of bleeding  [facility] Surgical Services LLCJennifer Westphalia, [address] Rainbow BoulevardPharm.D., BCPS Clinical Pharmacist Pager: [phone] 11/13/2015 8:57 AM

## 2015-11-13 NOTE — Consult Note (Addendum)
Consultation  Referring Provider: [facility] Primary Care Physician:  Billee Cashing, MD Primary Gastroenterologist:  None/unassigned  Reason for Consultation:  Iron deficiency anemia/heme positive stool  HPI: Christine Hicks is a 60 y.o. female who was admitted on 11/11/2015 with 3 week history of progressive exertional dyspnea. She says she really hasn't felt well for the past few months. She had acute worsening of her dyspnea for 2 days prior to admission. No associated fever though she has had some cough and had low-grade temp on admit. Workup on admit also showed an elevated BNP of 391, hemoglobin of 7.2 with MCV in the 60 range. D-dimer was positive and she had CT angiogram which was negative for pulmonary embolus. She is felt to have a component of congestive heart failure perhaps precipitated by severe anemia and is being diuresed.  Patient was also found to have a right lower extremity DVT today and is being started on heparin. She has prior history of DVT and PE. Patient has no prior history of anemia or iron deficiency that she is aware of. She said she would have had labs done last summer with Dr. Thea Silversmith , and has  not had any since. She  has not noted any melena or hematochezia or changes in her bowel habits , does have chronic constipation. No recent abdominal pain, though has had abdominal discomfort after major surgeries and severe complications from a ventral hernia repair in the early 2000s. She has no complaint of dysphagia ,odynophagia,GERD nausea or vomiting. Family history negative for GI diseases far she is aware . After 1 unit of packed RBCs hemoglobin is 8.6 hematocrit 31.6 and MCV of 69, serum iron 10 and TIBC 552 iron saturation of 2 and ferritin of 8  No prior colonoscopy or EGD.   Past Medical History  Diagnosis Date  . Hypertension   . Thyroid disease     Past Surgical History  Procedure Laterality Date  . 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 Hernia repair    . Right leg cellulitis surgery      Prior to Admission medications   Medication Sig Start Date End Date Taking? Authorizing Provider  aspirin 325 MG tablet Take 325 mg by mouth every 6 (six) hours as needed for moderate pain.   Yes Historical Provider, MD  benazepril (LOTENSIN) 20 MG tablet Take 20 mg by mouth daily.   Yes Historical Provider, MD  furosemide (LASIX) 40 MG tablet Take 40 mg by mouth daily.   Yes Historical Provider, MD  guaiFENesin (MUCINEX) 600 MG 12 hr tablet Take 600 mg by mouth 2 (two) times daily as needed for cough or to loosen phlegm.   Yes Historical Provider, MD  ibuprofen (ADVIL,MOTRIN) 600 MG tablet Take 600 mg by mouth every 6 (six) hours as needed for moderate pain.   Yes Historical Provider, MD  levothyroxine (SYNTHROID, LEVOTHROID) 175 MCG tablet Take 175 mcg by mouth daily before breakfast.   Yes Historical Provider, MD  Phenylephrine-APAP-Guaifenesin (MUCINEX FAST-MAX) 10-650-400 MG/20ML LIQD Take 30 mLs by mouth daily as needed (for cough).   Yes Historical Provider, MD  potassium chloride SA (K-DUR,KLOR-CON) 20 MEQ tablet Take 20 mEq by mouth daily.   Yes Historical Provider, MD  zolpidem (AMBIEN) 5 MG tablet Take 5 mg by mouth at bedtime as needed for sleep.   Yes Historical Provider, MD    Current Facility-Administered Medications  Medication Dose Route Frequency Provider Last Rate  Last Dose  . 0.9 %  sodium chloride infusion  10 mL/hr Intravenous Once Kristen N Ward, DO      . 0.9 %  sodium chloride infusion   Intravenous Once Rhetta MuraJai-Gurmukh Samtani, MD      . acetaminophen (TYLENOL) tablet 650 mg  650 mg Oral Q6H PRN Alberteen Samhristopher P Danford, MD   650 mg at 11/12/15 2350   Or  . acetaminophen (TYLENOL) suppository 650 mg  650 mg Rectal Q6H PRN Alberteen Samhristopher P Danford, MD      . albuterol (PROVENTIL) (2.5 MG/3ML) 0.083% nebulizer solution 2.5 mg  2.5 mg Nebulization Q2H PRN Alberteen Samhristopher P Danford,  MD      . benazepril (LOTENSIN) tablet 20 mg  20 mg Oral Daily Alberteen Samhristopher P Danford, MD   20 mg at 11/13/15 1000  . ferrous sulfate tablet 325 mg  325 mg Oral BID WC Alberteen Samhristopher P Danford, MD   325 mg at 11/13/15 1000  . furosemide (LASIX) tablet 40 mg  40 mg Oral BID Rhetta MuraJai-Gurmukh Samtani, MD      . guaiFENesin (MUCINEX) 12 hr tablet 600 mg  600 mg Oral BID PRN Alberteen Samhristopher P Danford, MD      . heparin ADULT infusion 100 units/mL (25000 units/250 mL)  1,550 Units/hr Intravenous Continuous Lynita LombardJennifer D UnionDurham, RPH 15.5 mL/hr at 11/13/15 0955 1,550 Units/hr at 11/13/15 0955  . levothyroxine (SYNTHROID, LEVOTHROID) tablet 100 mcg  100 mcg Oral QAC breakfast Rhetta MuraJai-Gurmukh Samtani, MD   100 mcg at 11/13/15 0801  . potassium chloride SA (K-DUR,KLOR-CON) CR tablet 20 mEq  20 mEq Oral Daily Alberteen Samhristopher P Danford, MD   20 mEq at 11/13/15 1000  . senna-docusate (Senokot-S) tablet 1 tablet  1 tablet Oral QHS PRN Alberteen Samhristopher P Danford, MD      . zolpidem (AMBIEN) tablet 5 mg  5 mg Oral QHS PRN Alberteen Samhristopher P Danford, MD        Allergies as of 11/10/2015  . (No Known Allergies)    Family History  Problem Relation Age of Onset  . Cardiomyopathy Son   . Heart attack Mother   . Emphysema Father   . Lung cancer Maternal Aunt     Social History   Social History  . Marital Status: Married    Spouse Name: N/A  . Number of Children: N/A  . Years of Education: N/A   Occupational History  . Not on file.   Social History Main Topics  . Smoking status: Former Games developermoker  . Smokeless tobacco: Not on file  . Alcohol Use: No  . Drug Use: Not on file  . Sexual Activity: Yes   Other Topics Concern  . Not on file   Social History Narrative    Review of Systems: Pertinent positive and negative review of systems were noted in the above HPI section.  All other review of systems was otherwise negative. Physical Exam: Vital signs in last 24 hours: Temp:  [98.3 F (36.8 C)-99.3 F (37.4 C)] 98.3 F (36.8 C)  (04/18 0428) Pulse Rate:  [84-98] 84 (04/18 0428) Resp:  [18] 18 (04/18 0428) BP: (111-128)/(37-51) 111/41 mmHg (04/18 0428) SpO2:  [97 %-98 %] 97 % (04/18 0428) Weight:  [272 lb 12.8 oz (123.741 kg)] 272 lb 12.8 oz (123.741 kg) (04/18 0254) Last BM Date: 11/11/15 General:   Alert,  Well-developed, well-nourished morbidly obese AA female , pleasant and cooperative in NAD Head:  Normocephalic and atraumatic. Eyes:  Sclera clear, no icterus.   Conjunctiva pink. Ears:  Normal  auditory acuity. Nose:  No deformity, discharge,  or lesions. Mouth:  No deformity or lesions.   Neck:  Supple; no masses or thyromegaly. Lungs: decrease BS ,few rales bases Heart:  Regular rate and rhythm; loud systolic murmur Abdomen:   Obese Soft,nontender, BS active,nonpalp mass or hsm.- multiple large lower abdominal scars,defects and hernia on left  Rectal:  Deferred  Msk:  Symmetrical without gross deformities. . Pulses:  Normal pulses noted. Extremities:  Tender right calf Neurologic:  Alert and  oriented x4;  grossly normal neurologically. Skin:  Intact without significant lesions or rashes.. Psych:  Alert and cooperative. Normal mood and affect.  Intake/Output from previous day: 04/17 0701 - 04/18 0700 In: 385 [I.V.:50; Blood:335] Out: 2400 [Urine:2400] Intake/Output this shift: Total I/O In: 350 [P.O.:350] Out: 200 [Urine:200]  Lab Results:  Recent Labs  11/10/15 2350 11/11/15 2213 11/12/15 0514 11/13/15 0640  WBC 10.7*  --  10.1 11.3*  HGB 7.2* 7.6* 7.6* 8.6*  HCT 28.0* 28.6* 27.7* 31.6*  PLT 183  --  165 158   BMET  Recent Labs  11/10/15 2350 11/12/15 0514 11/13/15 0640  NA 135 137 138  K 3.7 3.4* 3.6  CL 105 103 105  CO2 20* 25 22  GLUCOSE 113* 113* 101*  BUN CREATININE 0.70 0.74 0.69  CALCIUM 8.7* 8.9 8.8*   LFT  Recent Labs  11/11/15 0548  11/13/15 0640  PROT  --   < > 6.8  ALBUMIN  --   < > 3.2*  AST  --   < > 25  ALT  --   < > 19  ALKPHOS  --   < > 69   BILITOT 0.6  < > 0.9  BILIDIR 0.1  --   --   IBILI 0.5  --   --   < > = values in this interval not displayed. PT/INR  Recent Labs  11/12/15 0514 11/13/15 0640  LABPROT 16.7* 17.7*  INR 1.34 1.45    IMPRESSION:  #66 60 year old morbidly obese African-American female with severe iron deficiency anemia and Hemoccult-positive stool who has been asymptomatic from a GI standpoint. No prior GI workup Rule out occult colon neoplasm, AVMs occult upper GI lesion #2 progressive exertional dyspnea 3 weeks-currently being managed as CHF perhaps precipitated by severe anemia. 2-D echo pending-questioned worsening AS #3 holosystolic murmur #4 new DVT found this admission #5 prior history of DVT and PE #6 status post multiple abdominal surgeries after a complicated ventral hernia repair 17 years ago  PLAN: Patient will need endoscopic workup with colonoscopy and EGD this admission especially in light of need for anticoagulation with new DVT. I do not think she is ready for procedures tomorrow Transfuse to keep hemoglobin in the 8 range Start oral iron supplementation Patient starting heparin today, would hold on other anticoagulants until post EGD/colonoscopy Continue diuresis and follow up on 2-D echo--Will follow along and aim for Thursday or Friday for endoscopic evaluation Thank you   [name] Esterwood  11/13/2015, 11:13 AM     [facility] Attending   I have taken an interval history, reviewed the chart and examined the patient. I agree with the Advanced Practitioner's note, impression and recommendations.   Has iron def anemia and will need colonoscopy/EGD The risks and benefits as well as alternatives of endoscopic procedure(s) have been discussed and reviewed. All questions answered. The patient agrees to proceed. Maybe Thursday pending Tx and dx w/u underway  Co-morbidities increase risks of  procedures but will be necessary to have to understand her anemia.  Iva Boop, MD,  [facility] [phone]) [phone] after 5 PM, weekends and holidays  11/13/2015 6:07 PM

## 2015-11-14 ENCOUNTER — Observation Stay (HOSPITAL_BASED_OUTPATIENT_CLINIC_OR_DEPARTMENT_OTHER): Payer: BC Managed Care – PPO

## 2015-11-14 DIAGNOSIS — I509 Heart failure, unspecified: Secondary | ICD-10-CM | POA: Diagnosis not present

## 2015-11-14 DIAGNOSIS — Q231 Congenital insufficiency of aortic valve: Secondary | ICD-10-CM | POA: Diagnosis not present

## 2015-11-14 DIAGNOSIS — R0600 Dyspnea, unspecified: Secondary | ICD-10-CM | POA: Diagnosis not present

## 2015-11-14 DIAGNOSIS — I10 Essential (primary) hypertension: Secondary | ICD-10-CM | POA: Diagnosis not present

## 2015-11-14 DIAGNOSIS — L02419 Cutaneous abscess of limb, unspecified: Secondary | ICD-10-CM | POA: Diagnosis not present

## 2015-11-14 DIAGNOSIS — I350 Nonrheumatic aortic (valve) stenosis: Secondary | ICD-10-CM | POA: Diagnosis not present

## 2015-11-14 DIAGNOSIS — E039 Hypothyroidism, unspecified: Secondary | ICD-10-CM

## 2015-11-14 DIAGNOSIS — D509 Iron deficiency anemia, unspecified: Secondary | ICD-10-CM | POA: Diagnosis not present

## 2015-11-14 DIAGNOSIS — I82401 Acute embolism and thrombosis of unspecified deep veins of right lower extremity: Secondary | ICD-10-CM | POA: Diagnosis not present

## 2015-11-14 DIAGNOSIS — D649 Anemia, unspecified: Secondary | ICD-10-CM | POA: Diagnosis not present

## 2015-11-14 LAB — CBC
HEMATOCRIT: 32.9 % — AB (ref 36.0–46.0)
HEMOGLOBIN: 9.1 g/dL — AB (ref 12.0–15.0)
MCH: 20 pg — ABNORMAL LOW (ref 26.0–34.0)
MCHC: 27.7 g/dL — AB (ref 30.0–36.0)
MCV: 72.3 fL — ABNORMAL LOW (ref 78.0–100.0)
Platelets: 178 10*3/uL (ref 150–400)
RBC: 4.55 MIL/uL (ref 3.87–5.11)
RDW: 21.6 % — ABNORMAL HIGH (ref 11.5–15.5)
WBC: 12.9 10*3/uL — AB (ref 4.0–10.5)

## 2015-11-14 LAB — ECHOCARDIOGRAM COMPLETE
Height: 65 in
Weight: 4456 oz

## 2015-11-14 LAB — HEPARIN LEVEL (UNFRACTIONATED)
HEPARIN UNFRACTIONATED: 0.55 [IU]/mL (ref 0.30–0.70)
HEPARIN UNFRACTIONATED: 0.38 [IU]/mL (ref 0.30–0.70)

## 2015-11-14 LAB — OCCULT BLOOD X 1 CARD TO LAB, STOOL: Fecal Occult Bld: NEGATIVE

## 2015-11-14 NOTE — Progress Notes (Signed)
ANTICOAGULATION CONSULT NOTE   Pharmacy Consult for heparin Indication: acute R DVT  No Known Allergies  Patient Measurements: Height: 5\' 5"  (165.1 cm) Weight: 272 lb 12.8 oz (123.741 kg) IBW/kg (Calculated) : 57 Heparin Dosing Weight: 87 kg  Vital Signs: Temp: 99.2 F (37.3 C) (04/18 2218) Temp Source: Oral (04/18 2218) BP: 111/58 mmHg (04/18 2218) Pulse Rate: 92 (04/18 2218)  Labs:  Recent Labs  11/11/15 2213 11/12/15 0514 11/13/15 0640 11/13/15 1628 11/13/15 2356  HGB 7.6* 7.6* 8.6*  --   --   HCT 28.6* 27.7* 31.6*  --   --   PLT  --  165 158  --   --   LABPROT  --  16.7* 17.7*  --   --   INR  --  1.34 1.45  --   --   HEPARINUNFRC  --   --   --  0.17* 0.38  CREATININE  --  0.74 0.69  --   --     Estimated Creatinine Clearance: 97.6 mL/min (by C-G formula based on Cr of 0.69).   Assessment: 60 y/o obese female admitted on 11/10/2015 with progressive dyspnea and edema in LE. She was found to have symptomatic anemia as evidenced by her low Hb, iron, and Tsat. She reports no bleeding although FOB was positive on 4/16. Bilateral LE venous duplex reporting acute R DVT. CTA chest negative for PE. Pharmacy consulted to begin heparin at low dose due to anemia.  Heparin level therapeutic on 1800 units/hr. No bleeding noted.  Goal of Therapy:  Heparin level 0.3-0.5 units/ml due to anemia Monitor platelets by anticoagulation protocol: Yes   Plan:  - Continue heparin 1800 units / hr - Daily HL and CBC - Monitor for s/sx of bleeding  Christoper Fabianaron Jameica Couts, PharmD, BCPS Clinical pharmacist, pager [phone] 11/14/2015 12:36 AM

## 2015-11-14 NOTE — Progress Notes (Signed)
ANTICOAGULATION CONSULT NOTE   Pharmacy Consult for heparin Indication: acute R DVT  No Known Allergies  Patient Measurements: Height: 5\' 5"  (165.1 cm) Weight: 278 lb 8 oz (126.327 kg) IBW/kg (Calculated) : 57 Heparin Dosing Weight: 87 kg  Vital Signs: Temp: 99 F (37.2 C) (04/19 0558) Temp Source: Oral (04/19 0558) BP: 136/48 mmHg (04/19 0558) Pulse Rate: 88 (04/19 0558)  Labs:  Recent Labs  11/12/15 0514 11/13/15 0640 11/13/15 1628 11/13/15 2356 11/14/15 0740  HGB 7.6* 8.6*  --   --  9.1*  HCT 27.7* 31.6*  --   --  32.9*  PLT 165 158  --   --  178  LABPROT 16.7* 17.7*  --   --   --   INR 1.34 1.45  --   --   --   HEPARINUNFRC  --   --  0.17* 0.38 0.55  CREATININE 0.74 0.69  --   --   --     Estimated Creatinine Clearance: 98.7 mL/min (by C-G formula based on Cr of 0.69).   Assessment: 60 y/o obese female admitted on 11/10/2015 with progressive dyspnea and edema in LE. She was found to have symptomatic anemia as evidenced by her low Hb, iron, and Tsat. She reports no bleeding although FOB was positive on 4/16. Bilateral LE venous duplex reporting acute R DVT. CTA chest negative for PE. Pharmacy consulted to begin heparin at low dose due to anemia.  HL 0.55>>0.38 and is just above low goal therapeutic range on 1800 units/hr. No bleeding noted. Hb improved to 9.1, platelets stable.  Goal of Therapy:  Heparin level 0.3-0.5 units/ml due to anemia Monitor platelets by anticoagulation protocol: Yes   Plan:  - Decrease heparin drip to 1750 units/hr - Daily HL and CBC - Monitor for s/sx of bleeding - F/u oral agent once GI procedures complete  [facility] Howard Memorial HospitalJennifer Kemmerer, [place] IIPharm.D., BCPS Clinical Pharmacist Pager: [phone] 11/14/2015 10:50 AM

## 2015-11-14 NOTE — Progress Notes (Signed)
  Echocardiogram 2D Echocardiogram has been performed.  Christine SavoyCasey N Rosha Hicks 11/14/2015, 2:57 PM

## 2015-11-14 NOTE — Progress Notes (Signed)
          Daily Rounding Note  11/14/2015, 9:30 AM    SUBJECTIVE:       Breathing is better.  BM yesterday.  No abdominal pain  OBJECTIVE:         Vital signs in last 24 hours:    Temp:  [98.1 F (36.7 C)-100 F (37.8 C)] 99 F (37.2 C) (04/19 0558) Pulse Rate:  [88-95] 88 (04/19 0558) Resp:  [14-16] 16 (04/19 0558) BP: (110-136)/(43-58) 136/48 mmHg (04/19 0558) SpO2:  [96 %-98 %] 96 % (04/19 0558) Weight:  [126.327 kg (278 lb 8 oz)] 126.327 kg (278 lb 8 oz) (04/19 0558) Last BM Date: 11/11/15 Filed Weights   11/12/15 0543 11/13/15 0254 11/14/15 0558  Weight: 123.787 kg (272 lb 14.4 oz) 123.741 kg (272 lb 12.8 oz) 126.327 kg (278 lb 8 oz)   General: obese, looks well.  Unable to sit up in bed without 2 person assist.    Heart: RRR with 1-2/6 SEM.   Chest: clear bil.  No dyspnea or cough Abdomen: obese, multiple hernias.  NT.  Active BS  Extremities: fat but no pitting edema Neuro/Psych:  Oriented x 3.  Fully alert.  Moves all 4s.    Lab Results:  Recent Labs  11/12/15 0514 11/13/15 0640 11/14/15 0740  WBC 10.1 11.3* 12.9*  HGB 7.6* 8.6* 9.1*  HCT 27.7* 31.6* 32.9*  PLT 165 158 178     ASSESMENT:   *  IDA, FOBT +    *  New DVT.  Hx DVT and PE  On Heparin.   *  CHF?Marland Kitchen.  Due to anemia?  2 d echo not yet completed.     *  Morbid obesity.     *  Previous extensive abdominal surgeries.    PLAN   *  EGD, colonoscopy: timing per Dr Leone PayorGessner.  Pt will not consent to procedures until she has had echo and knows "my heart can take it"  Ordered clears in anticipation of bowel prep tonite, procedures tomorrow.    Jennye MoccasinSarah Gribbin  11/14/2015, 9:30 AM Pager: [phone]    [facility] Attending   I have taken an interval history, reviewed the chart and examined the patient. I agree with the Advanced Practitioner's note, impression and recommendations.   Await echo - she does have AS so an echo check before sedation and  procedures is reasonable.   Iva Booparl E. Gessner, MD, [facility] WingFACG [facility] [phone] after 5 PM, weekends and holidays  11/14/2015 5:46 PM

## 2015-11-14 NOTE — Progress Notes (Signed)
PROGRESS NOTE        PATIENT DETAILS Name: Christine Hicks Age: 60 y.o. Sex: female Date of Birth: [birth year] Admit Date: 11/10/2015 Admitting Physician Alberteen Sam, MD ZOX:[id number], Adela Lank, MD  Brief Narrative: Patient is a 60 y.o. female with past medical history of bicuspid aortic valve, hypertension, hypothyroidism who presented with exertional dyspnea. Found to have significant amount of anemia with hemoglobin of 7.2, anemia panel consistent with iron deficiency, FOBT positive. GI consulted for colonoscopy. Furthermore, found to have swelling in her right lower extremity, Doppler positive for DVT. See below for further details  Subjective: No major complaints-denies shortness of breath at rest, no chest pain. Claims able to ambulate much more freely without shortness of breath.  Assessment/Plan: Principal Problem: Exertional dyspnea: Improved. Suspect secondary to iron deficiency. No volume overload or CHF evident on exam. Await echocardiogram. Follow.  Active Problems: Iron deficiency anemia: Transfused 2 units of PRBC on 4/17, hemoglobin currently stable. GI following, awaiting on timing of colonoscopy. Follow.  Right lower extremity DVT: Mostly has a sedentary lifestyle, this is his second episode of DVT-she claims she had a prior episode of DVT in 2001/2002 when she was hospitalized with multiple surgical complications. Once she completes all her endoscopic procedures, she will need to be changed to oral anticoagulants. She will need outpatient follow-up with hematology to determine adequate duration of anticoagulation-as this is a second episode-although her first episode in 2001/2002 was likely a provoked DVT given prolonged hospitalization and numerous surgeries.  Hypothyroidism: Continue levothyroxine  Hypertension: Controlled, continue with benazepril and Lasix  History of aortic stenosis with bicuspid aortic valve: Await  echocardiogram.  History of bilateral varicose veins and bilateral chronic venous stasis changes: Doubt cellulitis, will discontinue Keflex.  Mildly prolonged QTC: But in the setting of RBBB. Monitor closely.  Morbid obesity: Counseled regarding importance of weight loss  DVT Prophylaxis: Full dose anticoagulation with Heparin  Code Status: Full code   Family Communication: Son at bedside  Disposition Plan: Remain inpatient-home in next 1-2 days  Antimicrobial agents: Keflex 4/16>>4/17  Procedures: Echo-pending  CONSULTS:  GI  Time spent: 25 minutes-Greater than 50% of this time was spent in counseling, explanation of diagnosis, planning of further management, and coordination of care.  MEDICATIONS: Anti-infectives    Start     Dose/Rate Route Frequency Ordered Stop   11/11/15 0600  cephALEXin (KEFLEX) capsule 500 mg  Status:  Discontinued     500 mg Oral 4 times per day 11/11/15 0459 11/12/15 1138      Scheduled Meds: . sodium chloride  10 mL/hr Intravenous Once  . sodium chloride   Intravenous Once  . benazepril  20 mg Oral Daily  . ferrous sulfate  325 mg Oral BID WC  . furosemide  40 mg Oral BID  . levothyroxine  100 mcg Oral QAC breakfast  . potassium chloride SA  20 mEq Oral Daily   Continuous Infusions: . heparin 1,750 Units/hr (11/14/15 1310)   PRN Meds:.acetaminophen **OR** acetaminophen, albuterol, guaiFENesin, senna-docusate, zolpidem   PHYSICAL EXAM: Vital signs: Filed Vitals:   11/13/15 2218 11/14/15 0558 11/14/15 1517 11/14/15 1551  BP: 111/58 136/48 94/50 114/41  Pulse: 92 88 87 85  Temp: 99.2 F (37.3 C) 99 F (37.2 C) 99.5 F (37.5 C) 98.4 F (36.9 C)  TempSrc: Oral Oral Oral Oral  Resp: 16 16  16   Height:      Weight:  126.327 kg (278 lb 8 oz)    SpO2: 97% 96% 99%    Filed Weights   11/12/15 0543 11/13/15 0254 11/14/15 0558  Weight: 123.787 kg (272 lb 14.4 oz) 123.741 kg (272 lb 12.8 oz) 126.327 kg (278 lb 8 oz)   Body mass  index is 46.34 kg/(m^2).   Gen Exam: Awake and alert with clear speech. Not in any distress  Neck: Supple, No JVD.   Chest: B/L Clear.   CVS: S1 S2 Regular,Plus systolic murmur Abdomen: soft, BS +, non tender, non distended.  Extremities: no edema, lower extremities warm to touch.Numerous varicose veins bilaterally, chronic venous stasis changes present. Neurologic: Non Focal.   Skin: No Rash or lesions   Wounds: N/A.   LABORATORY DATA: CBC:  Recent Labs Lab 11/10/15 2350 11/11/15 2213 11/12/15 0514 11/13/15 0640 11/14/15 0740  WBC 10.7*  --  10.1 11.3* 12.9*  NEUTROABS 6.8  --  6.3  --   --   HGB 7.2* 7.6* 7.6* 8.6* 9.1*  HCT 28.0* 28.6* 27.7* 31.6* 32.9*  MCV 67.0*  --  67.7* 69.8* 72.3*  PLT 183  --  165 158 178    Basic Metabolic Panel:  Recent Labs Lab 11/10/15 2350 11/12/15 0514 11/13/15 0640  NA 135 137 138  K 3.7 3.4* 3.6  CL 105 103 105  CO2 20* 25 22  GLUCOSE 113* 113* 101*  BUN CREATININE 0.70 0.74 0.69  CALCIUM 8.7* 8.9 8.8*    GFR: Estimated Creatinine Clearance: 98.7 mL/min (by C-G formula based on Cr of 0.69).  Liver Function Tests:  Recent Labs Lab 11/11/15 0548 11/12/15 0514 11/13/15 0640  AST  --  27 25  ALT  --  23 19  ALKPHOS  --  67 69  BILITOT 0.6 1.1 0.9  PROT  --  6.5 6.8  ALBUMIN  --  3.1* 3.2*   No results for input(s): LIPASE, AMYLASE in the last 168 hours. No results for input(s): AMMONIA in the last 168 hours.  Coagulation Profile:  Recent Labs Lab 11/10/15 2350 11/12/15 0514 11/13/15 0640  INR 1.18 1.34 1.45    Cardiac Enzymes: No results for input(s): CKTOTAL, CKMB, CKMBINDEX, TROPONINI in the last 168 hours.  BNP (last 3 results) No results for input(s): PROBNP in the last 8760 hours.  HbA1C: No results for input(s): HGBA1C in the last 72 hours.  CBG: No results for input(s): GLUCAP in the last 168 hours.  Lipid Profile: No results for input(s): CHOL, HDL, LDLCALC, TRIG, CHOLHDL, LDLDIRECT  in the last 72 hours.  Thyroid Function Tests: No results for input(s): TSH, T4TOTAL, FREET4, T3FREE, THYROIDAB in the last 72 hours.  Anemia Panel: No results for input(s): VITAMINB12, FOLATE, FERRITIN, TIBC, IRON, RETICCTPCT in the last 72 hours.  Urine analysis:    Component Value Date/Time   COLORURINE YELLOW 06/22/2010 2055   APPEARANCEUR HAZY* 06/22/2010 2055   LABSPEC 1.010 06/22/2010 2055   PHURINE 5.0 06/22/2010 2055   GLUCOSEU NEGATIVE 06/22/2010 2055   HGBUR NEGATIVE 06/22/2010 2055   BILIRUBINUR NEGATIVE 06/22/2010 2055   KETONESUR NEGATIVE 06/22/2010 2055   PROTEINUR NEGATIVE 06/22/2010 2055   UROBILINOGEN 0.2 06/22/2010 2055   NITRITE NEGATIVE 06/22/2010 2055   LEUKOCYTESUR  06/22/2010 2055    NEGATIVE MICROSCOPIC NOT DONE ON URINES WITH NEGATIVE PROTEIN, BLOOD, LEUKOCYTES, NITRITE, OR GLUCOSE <1000 mg/dL.    Sepsis Labs: Lactic Acid, Venous  Component Value Date/Time   LATICACIDVEN 1.7 11/11/2015 0548    MICROBIOLOGY: Recent Results (from the past 240 hour(s))  Culture, blood (routine x 2)     Status: None (Preliminary result)   Collection Time: 11/11/15  5:45 AM  Result Value Ref Range Status   Specimen Description BLOOD RIGHT ANTECUBITAL  Final   Special Requests BOTTLES DRAWN AEROBIC AND ANAEROBIC 10CC  Final   Culture NO GROWTH 3 DAYS  Final   Report Status PENDING  Incomplete  Culture, blood (routine x 2)     Status: None (Preliminary result)   Collection Time: 11/11/15  6:00 AM  Result Value Ref Range Status   Specimen Description BLOOD RIGHT HAND  Final   Special Requests BOTTLES DRAWN AEROBIC AND ANAEROBIC 10CC  Final   Culture NO GROWTH 3 DAYS  Final   Report Status PENDING  Incomplete    RADIOLOGY STUDIES/RESULTS: Dg Chest 2 View  11/12/2015  CLINICAL DATA:  60 year old female with history of shortness breath for the past 2 weeks. Congestive heart failure. EXAM: CHEST  2 VIEW COMPARISON:  Chest x-ray 09/18/2009. FINDINGS: Lung volumes  are normal. No consolidative airspace disease. No pleural effusions. Mild cephalization of the pulmonary vasculature, without frank pulmonary edema. No pneumothorax. No suspicious appearing pulmonary nodules or masses. Heart size is mildly enlarged. Upper mediastinal contours are within normal limits. Atherosclerosis in the thoracic aorta. IMPRESSION: 1. Mild cardiomegaly with pulmonary venous congestion, but no frank pulmonary edema. 2. Atherosclerosis. Electronically Signed   By: Trudie Reedaniel  Entrikin M.D.   On: 11/12/2015 08:02   Ct Angio Chest Pe W/cm &/or Wo Cm  11/11/2015  CLINICAL DATA:  Dyspnea, onset 1 week ago. Productive cough for 3 weeks. Bilateral lower extremity swelling, worsened over the past 2 days. EXAM: CT ANGIOGRAPHY CHEST WITH CONTRAST TECHNIQUE: Multidetector CT imaging of the chest was performed using the standard protocol during bolus administration of intravenous contrast. Multiplanar CT image reconstructions and MIPs were obtained to evaluate the vascular anatomy. CONTRAST:  80 mL Isovue 370 intravenous COMPARISON:  None. FINDINGS: Cardiovascular: There is good opacification of the pulmonary arteries. There is no pulmonary embolism. The thoracic aorta is normal in caliber and intact. There is enlargement of all cardiac chambers. Lungs: Mild linear basilar scarring or atelectasis. Otherwise clear. Central airways: Patent Effusions: None Lymphadenopathy: None Esophagus: Unremarkable Upper abdomen: Small hiatal hernia Musculoskeletal: No significant skeletal lesion. Review of the MIP images confirms the above findings. Mild motion degradation of the images. IMPRESSION: Negative for acute pulmonary embolism. There is a small hiatal hernia. There is enlargement of all cardiac chambers. Mild motion degradation of the images. Electronically Signed   By: Ellery Plunkaniel R Mitchell M.D.   On: 11/11/2015 02:28       Jeoffrey MassedGHIMIRE,Keynan Heffern, MD  [facility] Pager:[phone]  If 7PM-7AM, please contact  night-coverage [email or web address] Password [email or web address] 11/14/2015, 4:06 PM

## 2015-11-15 DIAGNOSIS — L03119 Cellulitis of unspecified part of limb: Secondary | ICD-10-CM

## 2015-11-15 DIAGNOSIS — I10 Essential (primary) hypertension: Secondary | ICD-10-CM

## 2015-11-15 DIAGNOSIS — R195 Other fecal abnormalities: Secondary | ICD-10-CM | POA: Insufficient documentation

## 2015-11-15 DIAGNOSIS — Q231 Congenital insufficiency of aortic valve: Secondary | ICD-10-CM | POA: Diagnosis not present

## 2015-11-15 DIAGNOSIS — R0600 Dyspnea, unspecified: Secondary | ICD-10-CM | POA: Diagnosis not present

## 2015-11-15 DIAGNOSIS — L02419 Cutaneous abscess of limb, unspecified: Secondary | ICD-10-CM | POA: Diagnosis not present

## 2015-11-15 DIAGNOSIS — D509 Iron deficiency anemia, unspecified: Secondary | ICD-10-CM | POA: Insufficient documentation

## 2015-11-15 DIAGNOSIS — D649 Anemia, unspecified: Secondary | ICD-10-CM | POA: Diagnosis not present

## 2015-11-15 LAB — TYPE AND SCREEN
ABO/RH(D): O POS
ANTIBODY SCREEN: NEGATIVE
DONOR AG TYPE: NEGATIVE
Donor AG Type: NEGATIVE
Donor AG Type: NEGATIVE
UNIT DIVISION: 0
Unit division: 0
Unit division: 0

## 2015-11-15 LAB — HEPARIN LEVEL (UNFRACTIONATED): HEPARIN UNFRACTIONATED: 0.46 [IU]/mL (ref 0.30–0.70)

## 2015-11-15 MED ORDER — BISACODYL 10 MG RE SUPP
10.0000 mg | Freq: Once | RECTAL | Status: AC
  Administered 2015-11-15: 10 mg via RECTAL
  Filled 2015-11-15: qty 1

## 2015-11-15 MED ORDER — PEG-KCL-NACL-NASULF-NA ASC-C 100 G PO SOLR
0.5000 | Freq: Once | ORAL | Status: AC
  Administered 2015-11-15: 100 g via ORAL
  Filled 2015-11-15: qty 1

## 2015-11-15 MED ORDER — PEG-KCL-NACL-NASULF-NA ASC-C 100 G PO SOLR: 1.0000 | Freq: Once | ORAL | Status: DC

## 2015-11-15 MED ORDER — PEG-KCL-NACL-NASULF-NA ASC-C 100 G PO SOLR
0.5000 | Freq: Once | ORAL | Status: AC
  Administered 2015-11-16: 100 g via ORAL

## 2015-11-15 MED ORDER — METOCLOPRAMIDE HCL 5 MG/ML IJ SOLN: 10.0000 mg | Freq: Four times a day (QID) | INTRAMUSCULAR | Status: DC | PRN

## 2015-11-15 NOTE — Progress Notes (Signed)
ANTICOAGULATION CONSULT NOTE   Pharmacy Consult for heparin Indication: acute R DVT  No Known Allergies  Patient Measurements: Height: 5\' 5"  (165.1 cm) Weight: 270 lb 15.1 oz (122.9 kg) IBW/kg (Calculated) : 57 Heparin Dosing Weight: 87 kg  Vital Signs: Temp: 98.3 F (36.8 C) (04/20 0525) BP: 121/40 mmHg (04/20 0525) Pulse Rate: 92 (04/20 0525)  Labs:  Recent Labs  11/13/15 0640  11/13/15 2356 11/14/15 0740 11/15/15 0525  HGB 8.6*  --   --  9.1*  --   HCT 31.6*  --   --  32.9*  --   PLT 158  --   --  178  --   LABPROT 17.7*  --   --   --   --   INR 1.45  --   --   --   --   HEPARINUNFRC  --   < > 0.38 0.55 0.46  CREATININE 0.69  --   --   --   --   < > = values in this interval not displayed.  Estimated Creatinine Clearance: 97.2 mL/min (by C-G formula based on Cr of 0.69).   Assessment: 60 y/o obese female admitted on 11/10/2015 with progressive dyspnea and edema in LE. She was found to have symptomatic anemia as evidenced by her low Hb, iron, and Tsat. She reports no bleeding although FOB was positive on 4/16. Bilateral LE venous duplex reporting acute R DVT. CTA chest negative for PE. Pharmacy consulted to begin heparin at low dose due to anemia.  HL remains therapeutic this morning at 0.46 units/mL. No bleeding noted. Hb improved to 9.1, platelets stable- no bleeding noted.  Goal of Therapy:  Heparin level 0.3-0.5 units/ml due to anemia Monitor platelets by anticoagulation protocol: Yes   Plan:  - Continue heparin at 1750 units/hr - Daily HL and CBC - Monitor for s/sx of bleeding - F/u oral agent once GI procedures complete - Patient on PO iron- ?need for IV repletion while in the hospital  Jousha Schwandt D. Beckhem Isadore, PharmD, BCPS Clinical Pharmacist Pager: [phone] 11/15/2015 11:30 AM

## 2015-11-15 NOTE — Progress Notes (Signed)
PROGRESS NOTE        PATIENT DETAILS Name: Christine Hicks Age: 60 y.o. Sex: female Date of Birth: 09/16/1955 Admit Date: 11/10/2015 Admitting Physician Alberteen Samhristopher P Danford, MD WUJ:[id number]:MCKENZIE, Adela LankWAYLAND, MD  Brief Narrative: Patient is a 60 y.o. female with past medical history of bicuspid aortic valve, hypertension, hypothyroidism who presented with exertional dyspnea. Found to have significant amount of anemia with hemoglobin of 7.2, anemia panel consistent with iron deficiency, FOBT positive. GI consulted for colonoscopy. Furthermore, found to have swelling in her right lower extremity, Doppler positive for DVT. See below for further details  Subjective: No major complaints-denies shortness of breath at rest, no chest pain.   Assessment/Plan: Principal Problem: Exertional dyspnea: Improved. Suspect secondary to iron deficiency. No volume overload or CHF evident on exam-Echo does show severe AS-await cards eval.  Active Problems: Iron deficiency anemia: Transfused 2 units of PRBC on 4/17, hemoglobin currently stable. GI following, awaiting on timing of colonoscopy. Follow.  Right lower extremity DVT: Mostly has a sedentary lifestyle, this is his second episode of DVT-she claims she had a prior episode of DVT in 2001/2002 when she was hospitalized with multiple surgical complications. Once she completes all her endoscopic procedures, she will need to be changed to oral anticoagulants. She will need outpatient follow-up with hematology to determine adequate duration of anticoagulation-as this is a second episode-although her first episode in 2001/2002 was likely a provoked DVT given prolonged hospitalization and numerous surgeries.  Aortic stenosis with bicuspid aortic valve: Echocardiogram shows severe AS-awaiting cards eval  Hypothyroidism: Continue levothyroxine  Hypertension: Controlled, continue with benazepril and Lasix  History of aortic stenosis with  bicuspid aortic valve: Await echocardiogram.  History of bilateral varicose veins and bilateral chronic venous stasis changes: Doubt cellulitis, Keflex has been discontinued  Mildly prolonged QTC: But in the setting of RBBB. Monitor closely.  Morbid obesity: Counseled regarding importance of weight loss  DVT Prophylaxis: Full dose anticoagulation with Heparin  Code Status: Full code   Family Communication: Son at bedside  Disposition Plan: Remain inpatient-home in next 1-2 days  Antimicrobial agents: Keflex 4/16>>4/17  Procedures: Echo-pending  CONSULTS:  GI  Time spent: 25 minutes-Greater than 50% of this time was spent in counseling, explanation of diagnosis, planning of further management, and coordination of care.  MEDICATIONS: Anti-infectives    Start     Dose/Rate Route Frequency Ordered Stop   11/11/15 0600  cephALEXin (KEFLEX) capsule 500 mg  Status:  Discontinued     500 mg Oral 4 times per day 11/11/15 0459 11/12/15 1138      Scheduled Meds: . sodium chloride  10 mL/hr Intravenous Once  . sodium chloride   Intravenous Once  . benazepril  20 mg Oral Daily  . ferrous sulfate  325 mg Oral BID WC  . furosemide  40 mg Oral BID  . levothyroxine  100 mcg Oral QAC breakfast  . potassium chloride SA  20 mEq Oral Daily   Continuous Infusions: . heparin 1,750 Units/hr (11/15/15 1202)   PRN Meds:.acetaminophen **OR** acetaminophen, albuterol, guaiFENesin, senna-docusate, zolpidem   PHYSICAL EXAM: Vital signs: Filed Vitals:   11/14/15 1517 11/14/15 1551 11/14/15 2122 11/15/15 0525  BP: 94/50 114/41 122/37 121/40  Pulse: 87 85 88 92  Temp: 99.5 F (37.5 C) 98.4 F (36.9 C) 98.7 F (37.1 C) 98.3 F (36.8 C)  TempSrc: Oral  Oral    Resp: Height:      Weight:    122.9 kg (270 lb 15.1 oz)  SpO2: 99%  99% 99%   Filed Weights   11/13/15 0254 11/14/15 0558 11/15/15 0525  Weight: 123.741 kg (272 lb 12.8 oz) 126.327 kg (278 lb 8 oz) 122.9 kg (270  lb 15.1 oz)   Body mass index is 45.09 kg/(m^2).   Gen Exam: Awake and alert with clear speech. Not in any distress  Neck: Supple, No JVD.   Chest: B/L Clear.   CVS: S1 S2 Regular,Plus systolic murmur Abdomen: soft, BS +, non tender, non distended.  Extremities: no edema, lower extremities warm to touch.Numerous varicose veins bilaterally, chronic venous stasis changes present. Neurologic: Non Focal.   Skin: No Rash or lesions   Wounds: N/A.   LABORATORY DATA: CBC:  Recent Labs Lab 11/10/15 2350 11/11/15 2213 11/12/15 0514 11/13/15 0640 11/14/15 0740  WBC 10.7*  --  10.1 11.3* 12.9*  NEUTROABS 6.8  --  6.3  --   --   HGB 7.2* 7.6* 7.6* 8.6* 9.1*  HCT 28.0* 28.6* 27.7* 31.6* 32.9*  MCV 67.0*  --  67.7* 69.8* 72.3*  PLT 183  --  165 158 178    Basic Metabolic Panel:  Recent Labs Lab 11/10/15 2350 11/12/15 0514 11/13/15 0640  NA 135 137 138  K 3.7 3.4* 3.6  CL 105 103 105  CO2 20* 25 22  GLUCOSE 113* 113* 101*  BUN CREATININE 0.70 0.74 0.69  CALCIUM 8.7* 8.9 8.8*    GFR: Estimated Creatinine Clearance: 97.2 mL/min (by C-G formula based on Cr of 0.69).  Liver Function Tests:  Recent Labs Lab 11/11/15 0548 11/12/15 0514 11/13/15 0640  AST  --  27 25  ALT  --  23 19  ALKPHOS  --  67 69  BILITOT 0.6 1.1 0.9  PROT  --  6.5 6.8  ALBUMIN  --  3.1* 3.2*   No results for input(s): LIPASE, AMYLASE in the last 168 hours. No results for input(s): AMMONIA in the last 168 hours.  Coagulation Profile:  Recent Labs Lab 11/10/15 2350 11/12/15 0514 11/13/15 0640  INR 1.18 1.34 1.45    Cardiac Enzymes: No results for input(s): CKTOTAL, CKMB, CKMBINDEX, TROPONINI in the last 168 hours.  BNP (last 3 results) No results for input(s): PROBNP in the last 8760 hours.  HbA1C: No results for input(s): HGBA1C in the last 72 hours.  CBG: No results for input(s): GLUCAP in the last 168 hours.  Lipid Profile: No results for input(s): CHOL, HDL,  LDLCALC, TRIG, CHOLHDL, LDLDIRECT in the last 72 hours.  Thyroid Function Tests: No results for input(s): TSH, T4TOTAL, FREET4, T3FREE, THYROIDAB in the last 72 hours.  Anemia Panel: No results for input(s): VITAMINB12, FOLATE, FERRITIN, TIBC, IRON, RETICCTPCT in the last 72 hours.  Urine analysis:    Component Value Date/Time   COLORURINE YELLOW 06/22/2010 2055   APPEARANCEUR HAZY* 06/22/2010 2055   LABSPEC 1.010 06/22/2010 2055   PHURINE 5.0 06/22/2010 2055   GLUCOSEU NEGATIVE 06/22/2010 2055   HGBUR NEGATIVE 06/22/2010 2055   BILIRUBINUR NEGATIVE 06/22/2010 2055   KETONESUR NEGATIVE 06/22/2010 2055   PROTEINUR NEGATIVE 06/22/2010 2055   UROBILINOGEN 0.2 06/22/2010 2055   NITRITE NEGATIVE 06/22/2010 2055   LEUKOCYTESUR  06/22/2010 2055    NEGATIVE MICROSCOPIC NOT DONE ON URINES WITH NEGATIVE PROTEIN, BLOOD, LEUKOCYTES, NITRITE, OR GLUCOSE <1000 mg/dL.  Sepsis Labs: Lactic Acid, Venous    Component Value Date/Time   LATICACIDVEN 1.7 11/11/2015 0548    MICROBIOLOGY: Recent Results (from the past 240 hour(s))  Culture, blood (routine x 2)     Status: None (Preliminary result)   Collection Time: 11/11/15  5:45 AM  Result Value Ref Range Status   Specimen Description BLOOD RIGHT ANTECUBITAL  Final   Special Requests BOTTLES DRAWN AEROBIC AND ANAEROBIC 10CC  Final   Culture NO GROWTH 3 DAYS  Final   Report Status PENDING  Incomplete  Culture, blood (routine x 2)     Status: None (Preliminary result)   Collection Time: 11/11/15  6:00 AM  Result Value Ref Range Status   Specimen Description BLOOD RIGHT HAND  Final   Special Requests BOTTLES DRAWN AEROBIC AND ANAEROBIC 10CC  Final   Culture NO GROWTH 3 DAYS  Final   Report Status PENDING  Incomplete    RADIOLOGY STUDIES/RESULTS: Dg Chest 2 View  11/12/2015  CLINICAL DATA:  61 year old female with history of shortness breath for the past 2 weeks. Congestive heart failure. EXAM: CHEST  2 VIEW COMPARISON:  Chest x-ray  09/18/2009. FINDINGS: Lung volumes are normal. No consolidative airspace disease. No pleural effusions. Mild cephalization of the pulmonary vasculature, without frank pulmonary edema. No pneumothorax. No suspicious appearing pulmonary nodules or masses. Heart size is mildly enlarged. Upper mediastinal contours are within normal limits. Atherosclerosis in the thoracic aorta. IMPRESSION: 1. Mild cardiomegaly with pulmonary venous congestion, but no frank pulmonary edema. 2. Atherosclerosis. Electronically Signed   By: Trudie Reed M.D.   On: 11/12/2015 08:02   Ct Angio Chest Pe W/cm &/or Wo Cm  11/11/2015  CLINICAL DATA:  Dyspnea, onset 1 week ago. Productive cough for 3 weeks. Bilateral lower extremity swelling, worsened over the past 2 days. EXAM: CT ANGIOGRAPHY CHEST WITH CONTRAST TECHNIQUE: Multidetector CT imaging of the chest was performed using the standard protocol during bolus administration of intravenous contrast. Multiplanar CT image reconstructions and MIPs were obtained to evaluate the vascular anatomy. CONTRAST:  80 mL Isovue 370 intravenous COMPARISON:  None. FINDINGS: Cardiovascular: There is good opacification of the pulmonary arteries. There is no pulmonary embolism. The thoracic aorta is normal in caliber and intact. There is enlargement of all cardiac chambers. Lungs: Mild linear basilar scarring or atelectasis. Otherwise clear. Central airways: Patent Effusions: None Lymphadenopathy: None Esophagus: Unremarkable Upper abdomen: Small hiatal hernia Musculoskeletal: No significant skeletal lesion. Review of the MIP images confirms the above findings. Mild motion degradation of the images. IMPRESSION: Negative for acute pulmonary embolism. There is a small hiatal hernia. There is enlargement of all cardiac chambers. Mild motion degradation of the images. Electronically Signed   By: Ellery Plunk M.D.   On: 11/11/2015 02:28       Jeoffrey Massed, MD  [facility] Pager:[phone]  If 7PM-7AM, please contact night-coverage [email or web address] Password [email or web address] 11/15/2015, 12:24 PM

## 2015-11-15 NOTE — Consult Note (Addendum)
CARDIOLOGY CONSULT NOTE   Patient ID: Christine Hicks MRN: [id number] DOB/AGE: Aug 03, 1955 60 y.o.  Admit date: 11/10/2015  Primary Physician   Christine Cashing, MD Primary Cardiologist: New Reason for Consultation: AS, Pre op eval   HPI: Christine Hicks is a 60 year old female with a past medical history of hypertension, hypothyroidism, bicuspid AV, and severe aortic stenosis.  She presented to the emergency department on 11/11/2015 with complaints of increasing dyspnea over the past 3 weeks. She was unable to walk from the car to the grocery store without stopping and gasping for air. Upon arrival to the ED she had a low-grade temperature of 99.5, mildly tachycardic. Her FOBT was positive , her hemoglobin was 7.2. BNP was elevated at 391, d-dimer elevated, PE ruled out by CTA. Her symptoms resolved with the administration of Lasix and she received one unit of PRBC. She was also found to have a right nonocclusive DVT for which she was started on heparin.  GI was consulted, she will need endoscopic evaluation. Cardiology consultation for preoperative evaluation.  Her echo shows of 55-60% with no wall motion abnormalities and grade 1 diastolic dysfunction. Her aortic valve has severe diffuse thickening and calcification with severe stenosis. LV cavity size was normal. Her last echo was in November 2011, at that time she had moderate stenosis and moderately calcified leaflets.  She has never had an ischemic evaluation or been seen by a cardiologist for her AS. She lives a mostly sedentary lifestyle. She is able to preform household activities like laundry and cooking without SOB. She cannot walk up a flight of stairs without becoming SOB.  She denies ever having any chest pain or palpitations.    Past Medical History  Diagnosis Date  . Hypertension   . Thyroid disease      Past Surgical History  Procedure Laterality Date  . 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 Hernia repair    . Right leg cellulitis surgery      No Known Allergies  I have reviewed the patient's current medications . sodium chloride  10 mL/hr Intravenous Once  . sodium chloride   Intravenous Once  . benazepril  20 mg Oral Daily  . ferrous sulfate  325 mg Oral BID WC  . furosemide  40 mg Oral BID  . levothyroxine  100 mcg Oral QAC breakfast  . potassium chloride SA  20 mEq Oral Daily   . heparin 1,750 Units/hr (11/15/15 0028)   acetaminophen **OR** acetaminophen, albuterol, guaiFENesin, senna-docusate, zolpidem  Prior to Admission medications   Medication Sig Start Date End Date Taking? Authorizing Provider  aspirin 325 MG tablet Take 325 mg by mouth every 6 (six) hours as needed for moderate pain.   Yes Historical Provider, MD  benazepril (LOTENSIN) 20 MG tablet Take 20 mg by mouth daily.   Yes Historical Provider, MD  furosemide (LASIX) 40 MG tablet Take 40 mg by mouth daily.   Yes Historical Provider, MD  guaiFENesin (MUCINEX) 600 MG 12 hr tablet Take 600 mg by mouth 2 (two) times daily as needed for cough or to loosen phlegm.   Yes Historical Provider, MD  ibuprofen (ADVIL,MOTRIN) 600 MG tablet Take 600 mg by mouth every 6 (six) hours as needed for moderate pain.   Yes Historical Provider, MD  levothyroxine (SYNTHROID, LEVOTHROID) 175 MCG tablet Take 175 mcg by mouth daily before breakfast.   Yes Historical Provider, MD  Phenylephrine-APAP-Guaifenesin (MUCINEX FAST-MAX) [phone]  MG/20ML LIQD Take 30 mLs by mouth daily as needed (for cough).   Yes Historical Provider, MD  potassium chloride SA (K-DUR,KLOR-CON) 20 MEQ tablet Take 20 mEq by mouth daily.   Yes Historical Provider, MD  zolpidem (AMBIEN) 5 MG tablet Take 5 mg by mouth at bedtime as needed for sleep.   Yes Historical Provider, MD     Social History   Social History  . Marital Status: Married    Spouse Name: N/A  . Number of Children: N/A  . Years of Education: N/A   Occupational History  .  Not on file.   Social History Main Topics  . Smoking status: Former Games developer  . Smokeless tobacco: Not on file  . Alcohol Use: No  . Drug Use: Not on file  . Sexual Activity: Yes   Other Topics Concern  . Not on file   Social History Narrative    No family status information on file.   Family History  Problem Relation Age of Onset  . Cardiomyopathy Son   . Heart attack Mother   . Emphysema Father   . Lung cancer Maternal Aunt      ROS:  Full 14 point review of systems complete and found to be negative unless listed above.  Physical Exam: Blood pressure 121/40, pulse 92, temperature 98.3 F (36.8 C), temperature source Oral, resp. rate 18, height  (1.651 m), weight 270 lb 15.1 oz (122.9 kg), SpO2 99 %.  General: Well developed, well nourished,obese female in no acute distress Head: Eyes PERRLA, No xanthomas. Normocephalic and atraumatic, oropharynx without edema or exudate.  Lungs: Diminished throughout.  Heart: HRRR S1 S2, no rub/gallop, holosystolic murmur. Pulses are 2+ extrem.   Neck: No carotid bruits. No lymphadenopathy. Difficult to assess JVD due to neck size.  Abdomen: Bowel sounds present, abdomen soft and non-tender without masses or hernias noted. Msk:  No spine or cva tenderness. No weakness, no joint deformities or effusions. Extremities: No clubbing or cyanosis. +2 edema BLE.  Neuro: Alert and oriented X 3. No focal deficits noted. Psych:  Good affect, responds appropriately Skin: No rashes or lesions noted.  Labs:   Lab Results  Component Value Date   WBC 12.9* 11/14/2015   HGB 9.1* 11/14/2015   HCT 32.9* 11/14/2015   MCV 72.3* 11/14/2015   PLT 178 11/14/2015    Recent Labs  11/13/15 0640  INR 1.45    Recent Labs Lab 11/13/15 0640  NA 138  K 3.6  CL 105  CO2 22  BUN 9  CREATININE 0.69  CALCIUM 8.8*  PROT 6.8  BILITOT 0.9  ALKPHOS 69  ALT 19  AST 25  GLUCOSE 101*  ALBUMIN 3.2*     B NATRIURETIC PEPTIDE  Date/Time Value Ref  Range Status  11/10/2015 11:50 PM 291.3* 0.0 - 100.0 pg/mL Final    Lab Results  Component Value Date   DDIMER 3.35* 11/10/2015   No results found for: LIPASE, AMYLASE TSH  Date/Time Value Ref Range Status  11/11/2015 05:48 AM 0.180* 0.350 - 4.500 uIU/mL Final  06/22/2010 09:48 PM 0.009* 0.350 - 4.500 uIU/mL Final   FERRITIN  Date/Time Value Ref Range Status  11/11/2015 05:48 AM 8* 11 - 307 ng/mL Final   TIBC  Date/Time Value Ref Range Status  11/11/2015 05:48 AM 552* 250 - 450 ug/dL Final   IRON  Date/Time Value Ref Range Status  11/11/2015 05:48 AM 10* 28 - 170 ug/dL Final   RETIC CT PCT  Date/Time Value Ref Range Status  11/11/2015 05:48 AM 2.3 0.4 - 3.1 % Final    Echo: 11/14/15 Study Conclusions  - Left ventricle: The cavity size was normal. There was severe  focal basal and moderate concentric hypertrophy. Systolic  function was normal. The estimated ejection fraction was in the  range of 55% to 60%. Wall motion was normal; there were no  regional wall motion abnormalities. There was an increased  relative contribution of atrial contraction to ventricular  filling. Doppler parameters are consistent with abnormal left  ventricular relaxation (grade 1 diastolic dysfunction). Doppler  parameters are consistent with high ventricular filling pressure. - Aortic valve: Severe diffuse thickening and calcification. Valve  mobility was restricted. There was severe stenosis. Valve area  (VTI): 0.64 cm^2. Valve area (Vmax): 0.43 cm^2. Valve area  (Vmean): 0.45 cm^2. - Mitral valve: Calcified annulus. There was mild regurgitation. - Pulmonary arteries: PA peak pressure: 31 mm Hg (S).  Transthoracic echocardiography. M-mode, complete 2D, spectral Doppler, and color Doppler. Birthdate: Patient birthdate: 12-18-1955. Age: Patient is 60 yr old. Sex: Gender: female. BMI: 46.3 kg/m^2. Patient status: Inpatient. Study date: Study date: 11/14/2015. Study  time: 01:54 PM.  -------------------------------------------------------------------  ------------------------------------------------------------------- Left ventricle: The cavity size was normal. There was severe focal basal and moderate concentric hypertrophy. Systolic function was normal. The estimated ejection fraction was in the range of 55% to 60%. Wall motion was normal; there were no regional wall motion abnormalities. There was an increased relative contribution of atrial contraction to ventricular filling. Doppler parameters are consistent with abnormal left ventricular relaxation (grade 1 diastolic dysfunction). Doppler parameters are consistent with high ventricular filling pressure.  ------------------------------------------------------------------- Aortic valve: Trileaflet. Severe diffuse thickening and calcification. Valve mobility was restricted. Doppler: There was severe stenosis. There was no regurgitation. VTI ratio of LVOT to aortic valve: 0.18. Valve area (VTI): 0.64 cm^2. Indexed valve area (VTI): 0.26 cm^2/m^2. Peak velocity ratio of LVOT to aortic valve: 0.12. Valve area (Vmax): 0.43 cm^2. Indexed valve area (Vmax): 0.17 cm^2/m^2. Mean velocity ratio of LVOT to aortic valve: 0.13. Valve area (Vmean): 0.45 cm^2. Indexed valve area (Vmean): 0.18 cm^2/m^2. Mean gradient (S): 45 mm Hg. Peak gradient (S): 92 mm Hg.  ECG: Sinus tachycardia with RBBB.    ASSESSMENT AND PLAN:    Principal Problem:   Dyspnea Active Problems:   Hypothyroidism   Essential hypertension   CELLULITIS, LEG, RIGHT   Symptomatic anemia   Bicuspid aortic valve   Prolonged QT interval  1. Preoperative evaluation: She has severe AS on recent echo. She is symptomatic with exertional SOB in the setting of hypovolemia with Hgb of 7.6. We will need to avoid diuretics, vasodilators, and beta blockers.   She has no coronary calcification on her CT Chest, she did not have  any chest pain with exertion when she was SOB, and her troponin was negative. She has never had an ischemic evaluation. No history of CHF or CVA. No previous syncope, she is able to lay flat. She is high risk for surgery as she is volume dependent with her AS. She will need to be hydrated to avoid hypotension, with careful consideration that she does not go into pulmonary edema.   She needs endoscopy and colonoscopy as she had positive FOBT.   2. Aortic Stenosis: Echo report above. She complains of SOB with exertion especially over the past 3 weeks. She was volume depleted with hgb of 7.6.  Better now after transfusion.   Signed: Little IshikawaErin E Zakeya Junker, NP 11/15/2015  10:31 AM Pager [phone]  Co-Sign MD The patient has been seen in conjunction with Suzzette Righter, NP. All aspects of care have been considered and discussed. The patient has been personally interviewed, examined, and all clinical data has been reviewed.   The patient is complicated after presenting with severe anemia related to iron deficiency. She required transfusion. In addition she has severe obesity, critical aortic stenosis, and right lower extremity DVT.  On presentation and after transfusion transfusion, there is suggestion of acute on chronic diastolic heart failure related to the combination of critical aortic stenosis and severe anemia. She has not had angina or syncope.  Echocardiogram reveals an aortic valve velocity greater than 4.8 m/s, compatible with critical aortic stenosis. This was at a time when hemoglobin was less than 8. Actual gradient with normal hemoglobin may actually be somewhat lower, but I believe that aortic stenosis is still severe.  There is no current evidence of overt heart failure, angina, ischemia, or hypotension.  Overall, the patient is at high risk for cardiac complications (greater than 5%) due to critical aortic stenosis. Her greatest risk would be either that of pulmonary edema or sudden  hypotension. I do believe with proper precaution, anesthesia can be given and both upper and lower endoscopy performed.  Hypotension should be treated with alpha agonist. Dyspnea/hypoxia should be treated with diuresis from the loop diuretic.  We will follow with you. She will need follow-up for future definitive therapy of the aortic valve. In the absence of frank cardiac decompensation, I do not believe there is a role for temporizing balloon aortic valvuloplasty (BAV) to improve her clinical status. This could change however depending upon her course.

## 2015-11-15 NOTE — Progress Notes (Signed)
Daily Rounding Note  11/15/2015, 8:19 AM    SUBJECTIVE:       Having BMs.  Clear sputum with cough, chronic.  No dyspnea  OBJECTIVE:         Vital signs in last 24 hours:    Temp:  [98.3 F (36.8 C)-99.5 F (37.5 C)] 98.3 F (36.8 C) (04/20 0525) Pulse Rate:  [85-92] 92 (04/20 0525) Resp:  [16-18] 18 (04/20 0525) BP: (94-122)/(37-50) 121/40 mmHg (04/20 0525) SpO2:  [99 %] 99 % (04/20 0525) Weight:  [122.9 kg (270 lb 15.1 oz)] 122.9 kg (270 lb 15.1 oz) (04/20 0525) Last BM Date: 11/11/15 Filed Weights   11/13/15 0254 11/14/15 0558 11/15/15 0525  Weight: 123.741 kg (272 lb 12.8 oz) 126.327 kg (278 lb 8 oz) 122.9 kg (270 lb 15.1 oz)   General: obese but looks well otherwise   Heart: RRR with harsh murmer Chest: clear bil.  No labored breathing Abdomen: obese, soft, NT, ND.  Active BS  Extremities: obese but no pitting edema Neuro/Psych:  Oriented x 3.  Fully alert.  Pleasant, cooperative.  In good spirits.  A bit anxious.   Intake/Output from previous day: 04/19 0701 - 04/20 0700 In: 360 [P.O.:220; I.V.:140] Out: 1000 [Urine:1000]    2D Echo:   Left ventricle: The cavity size was normal. There was severe  focal basal and moderate concentric hypertrophy. Systolic  function was normal. The estimated ejection fraction was in the  range of 55% to 60%. Wall motion was normal; there were no  regional wall motion abnormalities. There was an increased  relative contribution of atrial contraction to ventricular  filling. Doppler parameters are consistent with abnormal left  ventricular relaxation (grade 1 diastolic dysfunction). Doppler  parameters are consistent with high ventricular filling pressure. - Aortic valve: Severe diffuse thickening and calcification. Valve  mobility was restricted. There was severe stenosis. Valve area  (VTI): 0.64 cm^2. Valve area (Vmax): 0.43 cm^2. Valve area  (Vmean): 0.45  cm^2. - Mitral valve: Calcified annulus. There was mild regurgitation. - Pulmonary arteries: PA peak pressure: 31 mm Hg (S).  ASSESMENT:   * Iron Deficiency Anemia , FOBT +   * New DVT. 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   * CHF?Marland Kitchen. Due to anemia? severe AS, EF 55 to 60 %.  Grade 1 diastolic dysfunction.   * Morbid obesity.   * Previous extensive abdominal surgeries.     PLAN   *  Would like to proceed to EGD and colonoscopy.  However with echo findings, pt is going to be more reassured if cardiology provides their input prior to sedation (MAC).  Hopefully get this done today so we can set scopes up for 4/21.  Starting clears in anticipation of cardiac clearance.    *  Requested cards eval.  She has never seen cardiologist before.  *  Arranged slot for colon/egd ~ 10 to 11 AM tomorrow.  If cardiology gives ok.  Otherwise will cancel.      Jennye MoccasinSarah Gribbin  11/15/2015, 8:19 AM Pager: [phone]    [facility] Attending   I have taken an interval history, reviewed the chart and examined the patient. I agree with the Advanced Practitioner's note, impression and recommendations.     Cardiology input appreciated - guidance for Tx during sedated endoscopic evaluation. EGD/colonoscopy tomorrow. The risks and benefits as well as alternatives of endoscopic procedure(s) have been discussed and reviewed. All questions answered. The patient agrees  to proceed.   Iva Boop, MD, [facility] [phone] (pager) [phone] after 5 PM, weekends and holidays  11/15/2015 10:07 PM

## 2015-11-16 ENCOUNTER — Encounter (HOSPITAL_COMMUNITY): Payer: Self-pay | Admitting: Anesthesiology

## 2015-11-16 ENCOUNTER — Observation Stay (HOSPITAL_COMMUNITY): Payer: BC Managed Care – PPO | Admitting: Anesthesiology

## 2015-11-16 ENCOUNTER — Encounter (HOSPITAL_COMMUNITY)
Admission: EM | Disposition: A | Discharge status: Home / Self Care | Payer: Self-pay | Source: Home / Self Care | Attending: Emergency Medicine

## 2015-11-16 DIAGNOSIS — R195 Other fecal abnormalities: Secondary | ICD-10-CM | POA: Diagnosis not present

## 2015-11-16 DIAGNOSIS — I82401 Acute embolism and thrombosis of unspecified deep veins of right lower extremity: Secondary | ICD-10-CM | POA: Diagnosis not present

## 2015-11-16 DIAGNOSIS — D509 Iron deficiency anemia, unspecified: Secondary | ICD-10-CM | POA: Diagnosis not present

## 2015-11-16 DIAGNOSIS — I350 Nonrheumatic aortic (valve) stenosis: Secondary | ICD-10-CM | POA: Diagnosis not present

## 2015-11-16 DIAGNOSIS — Q231 Congenital insufficiency of aortic valve: Secondary | ICD-10-CM | POA: Diagnosis not present

## 2015-11-16 DIAGNOSIS — I10 Essential (primary) hypertension: Secondary | ICD-10-CM | POA: Diagnosis not present

## 2015-11-16 DIAGNOSIS — R0600 Dyspnea, unspecified: Secondary | ICD-10-CM | POA: Diagnosis not present

## 2015-11-16 HISTORY — PX: ESOPHAGOGASTRODUODENOSCOPY: SHX5428

## 2015-11-16 HISTORY — PX: COLONOSCOPY: SHX5424

## 2015-11-16 LAB — CULTURE, BLOOD (ROUTINE X 2)
Culture: NO GROWTH
Culture: NO GROWTH

## 2015-11-16 LAB — HEPARIN LEVEL (UNFRACTIONATED): Heparin Unfractionated: 0.46 [IU]/mL (ref 0.30–0.70)

## 2015-11-16 SURGERY — COLONOSCOPY
Anesthesia: Monitor Anesthesia Care

## 2015-11-16 MED ORDER — PROPOFOL 500 MG/50ML IV EMUL
INTRAVENOUS | Status: DC | PRN
  Administered 2015-11-16: 50 ug/kg/min via INTRAVENOUS

## 2015-11-16 MED ORDER — HEPARIN (PORCINE) IN NACL 100-0.45 UNIT/ML-% IJ SOLN
1650.0000 [IU]/h | INTRAMUSCULAR | Status: AC
  Administered 2015-11-16 – 2015-11-17 (×2): 1750 [IU]/h via INTRAVENOUS
  Filled 2015-11-16 (×2): qty 250

## 2015-11-16 MED ORDER — FENTANYL CITRATE (PF) 100 MCG/2ML IJ SOLN
INTRAMUSCULAR | Status: DC | PRN
Start: 2015-11-16 — End: 2015-11-16
  Administered 2015-11-16 (×3): 50 ug via INTRAVENOUS

## 2015-11-16 MED ORDER — KETAMINE HCL 100 MG/ML IJ SOLN
INTRAMUSCULAR | Status: AC
  Filled 2015-11-16: qty 1

## 2015-11-16 MED ORDER — LACTATED RINGERS IV SOLN
INTRAVENOUS | Status: DC
Start: 2015-11-16 — End: 2015-11-16
  Administered 2015-11-16: 1000 mL via INTRAVENOUS
  Administered 2015-11-16: 12:00:00 via INTRAVENOUS

## 2015-11-16 MED ORDER — BUTAMBEN-TETRACAINE-BENZOCAINE 2-2-14 % EX AERO
INHALATION_SPRAY | CUTANEOUS | Status: DC | PRN
  Administered 2015-11-16: 1 via TOPICAL

## 2015-11-16 MED ORDER — MIDAZOLAM HCL 5 MG/5ML IJ SOLN
INTRAMUSCULAR | Status: DC | PRN
  Administered 2015-11-16: 1 mg via INTRAVENOUS

## 2015-11-16 MED ORDER — LIDOCAINE HCL (CARDIAC) 20 MG/ML IV SOLN
INTRAVENOUS | Status: DC | PRN
  Administered 2015-11-16: 80 mg via INTRATRACHEAL

## 2015-11-16 MED ORDER — PROPOFOL 10 MG/ML IV BOLUS
INTRAVENOUS | Status: DC | PRN
  Administered 2015-11-16: 20 mg via INTRAVENOUS

## 2015-11-16 MED ORDER — SODIUM CHLORIDE 0.9 % IV SOLN: INTRAVENOUS | Status: DC

## 2015-11-16 MED ORDER — ONDANSETRON HCL 4 MG/2ML IJ SOLN
INTRAMUSCULAR | Status: DC | PRN
  Administered 2015-11-16: 4 mg via INTRAVENOUS

## 2015-11-16 NOTE — Progress Notes (Signed)
Patient ID: Archie PattenJudith M Burnstein, female   DOB: 02/17/1956, 60 y.o.   MRN: [id number]  For Colon /EGD today- tolerated prep -stool clear Breathing improved per pt  Heparin off since 7:30 am  Anesthesia involved for sedation

## 2015-11-16 NOTE — Op Note (Signed)
[facility] Patient Name: Christine Hicks Procedure Date : 11/16/2015 MRN: [id number] Attending MD: Iva Boop , MD Date of Birth: 1956-05-22 CSN: [id number] Age: 60 Admit Type: Inpatient Procedure:                Upper GI endoscopy Indications:              Iron deficiency anemia Providers:                Iva Boop, MD, Dwain Sarna, RN, Lorenda Ishihara,                            Technician, Ferol Luz, CRNA Referring MD:              Medicines:                Monitored Anesthesia Care Complications:            No immediate complications. Estimated Blood Loss:     Estimated blood loss: none. Procedure:                Pre-Anesthesia Assessment:                           - Prior to the procedure, a History and Physical                            was performed, and patient medications and                            allergies were reviewed. The patient's tolerance of                            previous anesthesia was also reviewed. The risks                            and benefits of the procedure and the sedation                            options and risks were discussed with the patient.                            All questions were answered, and informed consent                            was obtained. Prior Anticoagulants: The patient                            last took heparin on the day of the procedure. ASA                            Grade Assessment: III - A patient with severe                            systemic disease. After reviewing the risks and  benefits, the patient was deemed in satisfactory                            condition to undergo the procedure.                           After obtaining informed consent, the endoscope was                            passed under direct vision. Throughout the                            procedure, the patient's blood pressure, pulse, and                            oxygen  saturations were monitored continuously. The                            [id number] ([id number] scope was introduced through the                            mouth, and advanced to the second part of duodenum.                            The upper GI endoscopy was accomplished without                            difficulty. The patient tolerated the procedure                            well. Scope In: Scope Out: Findings:      The esophagus was normal.      The examined duodenum was normal.      The cardia and gastric fundus were normal on retroflexion.      Multiple, 20 mm non-bleeding erosions were found in the stomach. There       were stigmata of recent bleeding. Biopsies were taken with a cold       forceps for histology. Verification of patient identification for the       specimen was done. Estimated blood loss was minimal. Impression:               - Normal esophagus.                           - Normal examined duodenum.                           - Non-bleeding erosive gastropathy. Biopsied. Moderate Sedation:      Please see anesthesia notes, moderate sedation not given Recommendation:           - Return patient to hospital ward for ongoing care.                           - See the other procedure note for documentation of  additional recommendations.                           - Use Protonix (pantoprazole) 40 mg PO daily.                           - OK to anticoagulate                           - Await pathology results.                           - Resume heparin at prior dose today.                           - Resume regular diet. Procedure Code(s):        --- Professional ---                           [phone], Esophagogastroduodenoscopy, flexible,                            transoral; diagnostic, including collection of                            specimen(s) by brushing or washing, when performed                            (separate procedure) Diagnosis  Code(s):        --- Professional ---                           D50.9, Iron deficiency anemia, unspecified CPT copyright 2016 [employer]. All rights reserved. The codes documented in this report are preliminary and upon coder review may  be revised to meet current compliance requirements. Iva Boop, MD 11/16/2015 12:48:12 PM This report has been signed electronically. Number of Addenda: 0

## 2015-11-16 NOTE — Progress Notes (Signed)
PROGRESS NOTE  Christine PattenJudith M Hicks QMV:[id number]:[id number] DOB: 11/09/1955 DOA: 11/10/2015 PCP: Christine Hicks Outpatient Specialists:    Brief Narrative: Patient is a 60 year old female with a past medical history of bicuspid aortic valve, hypertension, hypothyroidism who presented with exertional dyspnea. Found to have significant amount of anemia with hemoglobin of 7.2, anemia panel consistent with iron deficiency, FOBT positive. Furthermore, found to have swelling in her right lower extremity, Doppler positive for DVT. EGD and Colonoscopy being performed today.  Subjective: No new complaints. Patient was able to finish bowel prep last night for colonoscopy today.   Assessment & Plan: Principal Problem: Exertional dyspnea: Improved. Suspect secondary to iron deficiency. No volume overload or CHF evident on exam-Echo showed severe AS. Cardiology consulted and determined that she will need definitive therapy in the future, but BAV is not necessary at this time.   Active Problems: Iron deficiency anemia: Transfused 2 units of PRBC on 4/17, hemoglobin currently stable. GI following. EGD and colonoscopy results pending.  Right lower extremity DVT: Mostly has a sedentary lifestyle, this is his second episode of DVT-she claims she had a prior episode of DVT in 2001/2002 when she was hospitalized with multiple surgical complications. Once she completes all her endoscopic procedures, she will need to be changed to oral anticoagulants. She will need outpatient follow-up with hematology to determine adequate duration of anticoagulation-as this is a second episode-although her first episode in 2001/2002 was likely a provoked DVT given prolonged hospitalization and numerous surgeries.  Aortic stenosis with bicuspid aortic valve: Echocardiogram showed severe AS. Cardiology was consulted and determined that she will need definitive therapy in the future but BAV is not necessary at this time.   Hypothyroidism:  Continue levothyroxine  Hypertension: Controlled, continue with benazepril and Lasix  History of bilateral varicose veins and bilateral chronic venous stasis changes: Doubt cellulitis, Keflex has been discontinued  Mildly prolonged QTC: But in the setting of RBBB. Monitor closely.  Morbid obesity: Counseled regarding importance of weight loss  DVT prophylaxis: Heparin Code Status: Full Family Communication: No family at bedside Disposition Plan: Home depending on colonoscopy results  Consultants:  Gastroenterology Cardiology  Procedures:  Echo: severe aortic stenosis EGD and Colonoscopy: results pending  Antimicrobials: Keflex  Objective: Filed Vitals:   11/15/15 2138 11/16/15 0537 11/16/15 0900 11/16/15 1046  BP: 100/35 112/45 131/59 129/49  Pulse: 81 78 81   Temp: 98 F (36.7 C) 97.8 F (36.6 C)  98.5 F (36.9 C)  TempSrc: Oral Oral  Oral  Resp: 18 17  25   Height:      Weight:  128.9 kg (284 lb 2.8 oz)    SpO2: 100% 98%  100%    Intake/Output Summary (Last 24 hours) at 11/16/15 1150 Last data filed at 11/16/15 0805  Gross per 24 hour  Intake  213.5 ml  Output      0 ml  Net  213.5 ml   Filed Weights   11/14/15 0558 11/15/15 0525 11/16/15 0537  Weight: 126.327 kg (278 lb 8 oz) 122.9 kg (270 lb 15.1 oz) 128.9 kg (284 lb 2.8 oz)    Examination:  Filed Vitals:   11/15/15 2138 11/16/15 0537 11/16/15 0900 11/16/15 1046  BP: 100/35 112/45 131/59 129/49  Pulse: 81 78 81   Temp: 98 F (36.7 C) 97.8 F (36.6 C)  98.5 F (36.9 C)  TempSrc: Oral Oral  Oral  Resp: 18 17  25   Height:      Weight:  128.9 kg (284  lb 2.8 oz)    SpO2: 100% 98%  100%   Gen Exam: Awake and alert with clear speech. Not in any distress  Neck: Supple, No JVD.  Chest: B/L Clear.  CVS: S1 S2 Regular,Plus systolic murmur Abdomen: soft, BS +, non tender, non distended.  Extremities: no edema, lower extremities warm to touch. Numerous varicose veins bilaterally, chronic venous  stasis changes present. Neurologic: Non Focal.  Skin: No Rash or lesions   Data Reviewed: I have personally reviewed following labs and imaging studies  CBC:  Recent Labs Lab 11/10/15 2350 11/11/15 2213 11/12/15 0514 11/13/15 0640 11/14/15 0740  WBC 10.7*  --  10.1 11.3* 12.9*  NEUTROABS 6.8  --  6.3  --   --   HGB 7.2* 7.6* 7.6* 8.6* 9.1*  HCT 28.0* 28.6* 27.7* 31.6* 32.9*  MCV 67.0*  --  67.7* 69.8* 72.3*  PLT 183  --  165 158 178   Basic Metabolic Panel:  Recent Labs Lab 11/10/15 2350 11/12/15 0514 11/13/15 0640  NA 135 137 138  K 3.7 3.4* 3.6  CL 105 103 105  CO2 20* 25 22  GLUCOSE 113* 113* 101*  BUN CREATININE 0.70 0.74 0.69  CALCIUM 8.7* 8.9 8.8*   GFR: Estimated Creatinine Clearance: 100 mL/min (by C-G formula based on Cr of 0.69). Liver Function Tests:  Recent Labs Lab 11/11/15 0548 11/12/15 0514 11/13/15 0640  AST  --  27 25  ALT  --  23 19  ALKPHOS  --  67 69  BILITOT 0.6 1.1 0.9  PROT  --  6.5 6.8  ALBUMIN  --  3.1* 3.2*   Coagulation Profile:  Recent Labs Lab 11/10/15 2350 11/12/15 0514 11/13/15 0640  INR 1.18 1.34 1.45   Urine analysis:    Component Value Date/Time   COLORURINE YELLOW 06/22/2010 2055   APPEARANCEUR HAZY* 06/22/2010 2055   LABSPEC 1.010 06/22/2010 2055   PHURINE 5.0 06/22/2010 2055   GLUCOSEU NEGATIVE 06/22/2010 2055   HGBUR NEGATIVE 06/22/2010 2055   BILIRUBINUR NEGATIVE 06/22/2010 2055   KETONESUR NEGATIVE 06/22/2010 2055   PROTEINUR NEGATIVE 06/22/2010 2055   UROBILINOGEN 0.2 06/22/2010 2055   NITRITE NEGATIVE 06/22/2010 2055   LEUKOCYTESUR  06/22/2010 2055    NEGATIVE MICROSCOPIC NOT DONE ON URINES WITH NEGATIVE PROTEIN, BLOOD, LEUKOCYTES, NITRITE, OR GLUCOSE <1000 mg/dL.   Sepsis Labs:  Recent Results (from the past 240 hour(s))  Culture, blood (routine x 2)     Status: None (Preliminary result)   Collection Time: 11/11/15  5:45 AM  Result Value Ref Range Status   Specimen Description  BLOOD RIGHT ANTECUBITAL  Final   Special Requests BOTTLES DRAWN AEROBIC AND ANAEROBIC 10CC  Final   Culture NO GROWTH 4 DAYS  Final   Report Status PENDING  Incomplete  Culture, blood (routine x 2)     Status: None (Preliminary result)   Collection Time: 11/11/15  6:00 AM  Result Value Ref Range Status   Specimen Description BLOOD RIGHT HAND  Final   Special Requests BOTTLES DRAWN AEROBIC AND ANAEROBIC 10CC  Final   Culture NO GROWTH 4 DAYS  Final   Report Status PENDING  Incomplete     Scheduled Meds: . [MAR Hold] sodium chloride  10 mL/hr Intravenous Once  . [MAR Hold] sodium chloride   Intravenous Once  . [MAR Hold] benazepril  20 mg Oral Daily  . [MAR Hold] furosemide  40 mg Oral BID  . [MAR Hold] levothyroxine  100  mcg Oral QAC breakfast  . [MAR Hold] potassium chloride SA  20 mEq Oral Daily   Continuous Infusions: . sodium chloride    . lactated ringers 1,000 mL (11/16/15 1055)   Time spent: 25 minutes   Jeoffrey Massed, [facility] Pager:[phone]  If 7PM-7AM, please contact night-coverage [email or web address] Password [facility] 11/16/2015, 11:50 AM

## 2015-11-16 NOTE — Progress Notes (Signed)
A-line removed from right wrist. Catheter in intact. Pressure held for 15 minutes. New dressing applied, site is clean, dry, intact and no active bleeding. New dressing is dry and intact. Report given to RN on 5W34 to apply pressure if bleeding restarts for any reason.

## 2015-11-16 NOTE — Anesthesia Preprocedure Evaluation (Addendum)
Anesthesia Evaluation  Patient identified by MRN, date of birth, ID band Patient awake    Reviewed: Allergy & Precautions, H&P , NPO status , Patient's Chart, lab work & pertinent test results  History of Anesthesia Complications Negative for: history of anesthetic complications  Airway Mallampati: I  TM Distance: >3 FB Neck ROM: full    Dental  (+) Missing   Pulmonary shortness of breath, former smoker,    Pulmonary exam normal breath sounds clear to auscultation       Cardiovascular hypertension, Pt. on medications + Peripheral Vascular Disease  Normal cardiovascular exam+ Valvular Problems/Murmurs AS  Rhythm:regular Rate:Normal  Patient with 2017 Echo showing severe, critical AS.Marland Kitchen. She is not in acute heart failure or having active angina at this time   Neuro/Psych negative neurological ROS     GI/Hepatic negative GI ROS, Neg liver ROS,   Endo/Other  Hypothyroidism Morbid obesity  Renal/GU negative Renal ROS     Musculoskeletal   Abdominal   Peds  Hematology  (+) anemia ,   Anesthesia Other Findings   Reproductive/Obstetrics negative OB ROS                          Anesthesia Physical Anesthesia Plan  ASA: IV  Anesthesia Plan: MAC   Post-op Pain Management:    Induction: Intravenous  Airway Management Planned: Nasal Cannula  Additional Equipment: Arterial line  Intra-op Plan:   Post-operative Plan:   Informed Consent: I have reviewed the patients History and Physical, chart, labs and discussed the procedure including the risks, benefits and alternatives for the proposed anesthesia with the patient or authorized representative who has indicated his/her understanding and acceptance.   Dental Advisory Given  Plan Discussed with: Anesthesiologist, CRNA and Surgeon  Anesthesia Plan Comments: (Will need A line, would recommend propofol infusion and phenylephrine infusion  started at same time, maintain preload with crystalloid/albumin, avoid tachycardia and hypotension, ketamine dose at 10-20 mg at a time recommended for analgesia and to maintain SVR during procedure )       Anesthesia Quick Evaluation

## 2015-11-16 NOTE — Progress Notes (Signed)
Patient Name: Christine Hicks Date of Encounter: 11/16/2015  Principal Problem:   Dyspnea Active Problems:   Hypothyroidism   Essential hypertension   CELLULITIS, LEG, RIGHT   Symptomatic anemia   Bicuspid aortic valve   Prolonged QT interval   Heme + stool   Anemia, iron deficiency   Primary Cardiologist: New Patient Profile: Christine Hicks is a 60 year old female with a past medical history of hypertension, hypothyroidism, bicuspid AV, and severe aortic stenosis. Cardiology was consulted for preop evaluation as she need GI Work up, also Echo shows severe AS with aortic valve velocity greater than 4.8 m/s.    SUBJECTIVE:Feel ok this am, tired from being up a lot last night after taking bowel prep.  No SOB.    OBJECTIVE Filed Vitals:   11/15/15 0525 11/15/15 1437 11/15/15 2138 11/16/15 0537  BP: 121/40 115/97 100/35 112/45  Pulse: 92 82 81 78  Temp: 98.3 F (36.8 C) 98.4 F (36.9 C) 98 F (36.7 C) 97.8 F (36.6 C)  TempSrc:  Oral Oral Oral  Resp: Height:      Weight: 270 lb 15.1 oz (122.9 kg)   284 lb 2.8 oz (128.9 kg)  SpO2: 99% 100% 100% 98%    Intake/Output Summary (Last 24 hours) at 11/16/15 0815 Last data filed at 11/16/15 0805  Gross per 24 hour  Intake  573.5 ml  Output    500 ml  Net   73.5 ml   Filed Weights   11/14/15 0558 11/15/15 0525 11/16/15 0537  Weight: 278 lb 8 oz (126.327 kg) 270 lb 15.1 oz (122.9 kg) 284 lb 2.8 oz (128.9 kg)    PHYSICAL EXAM General: Well developed, well nourished, female in no acute distress. Head: Normocephalic, atraumatic.  Neck: Supple without bruits,No JVD. Lungs:  Resp regular and unlabored, CTA. Heart: RRR, S1, S2, no S3, S4, 3/6 holosystolic murmur; no rub. Abdomen: Soft, non-tender, non-distended, BS + x 4.  Extremities: No clubbing, cyanosis, +1 edema BLE  Neuro: Alert and oriented X 3. Moves all extremities spontaneously. Psych: Normal affect.  LABS: CBC: Recent Labs  11/14/15 0740  WBC  12.9*  HGB 9.1*  HCT 32.9*  MCV 72.3*  PLT 178   B NATRIURETIC PEPTIDE  Date/Time Value Ref Range Status  11/10/2015 11:50 PM 291.3* 0.0 - 100.0 pg/mL Final      Current facility-administered medications:  .  0.9 %  sodium chloride infusion, 10 mL/hr, Intravenous, Once, Kristen N Ward, DO .  0.9 %  sodium chloride infusion, , Intravenous, Once, Rhetta Mura, MD .  acetaminophen (TYLENOL) tablet 650 mg, 650 mg, Oral, Q6H PRN, 650 mg at 11/12/15 2350 **OR** acetaminophen (TYLENOL) suppository 650 mg, 650 mg, Rectal, Q6H PRN, Alberteen Sam, MD .  albuterol (PROVENTIL) (2.5 MG/3ML) 0.083% nebulizer solution 2.5 mg, 2.5 mg, Nebulization, Q2H PRN, Alberteen Sam, MD .  benazepril (LOTENSIN) tablet 20 mg, 20 mg, Oral, Daily, Alberteen Sam, MD, 20 mg at 11/15/15 1610 .  furosemide (LASIX) tablet 40 mg, 40 mg, Oral, BID, Rhetta Mura, MD, 40 mg at 11/16/15 0540 .  guaiFENesin (MUCINEX) 12 hr tablet 600 mg, 600 mg, Oral, BID PRN, Alberteen Sam, MD .  heparin ADULT infusion 100 units/mL (25000 units/250 mL), 1,750 Units/hr, Intravenous, Continuous, Dianah Field, PA-C, Stopped at 11/16/15 [phone] .  levothyroxine (SYNTHROID, LEVOTHROID) tablet 100 mcg, 100 mcg, Oral, QAC breakfast, Rhetta Mura, MD, 100 mcg at 11/15/15 5409 .  metoCLOPramide (  REGLAN) injection 10 mg, 10 mg, Intravenous, Q6H PRN, Lindalou HoseSarah J Gribbin, PA-C .  potassium chloride SA (K-DUR,KLOR-CON) CR tablet 20 mEq, 20 mEq, Oral, Daily, Alberteen Samhristopher P Danford, MD, 20 mEq at 11/15/15 0923 .  senna-docusate (Senokot-S) tablet 1 tablet, 1 tablet, Oral, QHS PRN, Alberteen Samhristopher P Danford, MD .  zolpidem (AMBIEN) tablet 5 mg, 5 mg, Oral, QHS PRN, Alberteen Samhristopher P Danford, MD, 5 mg at 11/14/15 2241 . heparin Stopped (11/16/15 0722)      TELE: NSR    ECG: NSR w RBBB     Current Medications:  . sodium chloride  10 mL/hr Intravenous Once  . sodium chloride   Intravenous Once  . benazepril  20  mg Oral Daily  . furosemide  40 mg Oral BID  . levothyroxine  100 mcg Oral QAC breakfast  . potassium chloride SA  20 mEq Oral Daily   . heparin Stopped (11/16/15 [id number])    ASSESSMENT AND PLAN: Principal Problem:   Dyspnea Active Problems:   Hypothyroidism   Essential hypertension   CELLULITIS, LEG, RIGHT   Symptomatic anemia   Bicuspid aortic valve   Prolonged QT interval   Heme + stool   Anemia, iron deficiency  1. Aortic stenosis: Will need definitive therapy in the future.  No role for BAV at this time, we will follow her clinical course in case this should change. No SOB at this time, no syncope or presyncope.    2. Anemia, Heme + stool: For EGD and colonoscopy today.  MD has evaluated, high risk for cardiac complications. Will follow report.    Signed, Little IshikawaErin E Smith , NP 8:15 AM 11/16/2015 Pager [phone] The patient has been seen in conjunction with Suzzette RighterErin Smith, NP. All aspects of care have been considered and discussed. The patient has been personally interviewed, examined, and all clinical data has been reviewed.   Patient is stable overnight, but still being bothered by a hacking dry cough.  After endoscopy, over weekend, consider a little more aggressive diuresis. I believe the cough is related to acute on chronic diastolic heart failure. Avoid more aggressive diuresis until after the endoscopic procedure.

## 2015-11-16 NOTE — Op Note (Signed)
[facility] Patient Name: Christine Hicks Procedure Date : 11/16/2015 MRN: [id number] Attending MD: Iva Boop , MD Date of Birth: 05-06-1956 CSN: [id number] Age: 60 Admit Type: Inpatient Procedure:                Colonoscopy Indications:              Unexplained iron deficiency anemia Providers:                Iva Boop, MD, Dwain Sarna, RN, Lorenda Ishihara,                            Technician, Ferol Luz, CRNA Referring MD:              Medicines:                Propofol per Anesthesia, Monitored Anesthesia Care Complications:            No immediate complications. Estimated blood loss:                            None. Estimated Blood Loss:     Estimated blood loss: none. Procedure:                Pre-Anesthesia Assessment:                           - Prior to the procedure, a History and Physical                            was performed, and patient medications and                            allergies were reviewed. The patient's tolerance of                            previous anesthesia was also reviewed. The risks                            and benefits of the procedure and the sedation                            options and risks were discussed with the patient.                            All questions were answered, and informed consent                            was obtained. Prior Anticoagulants: The patient                            last took heparin on the day of the procedure. ASA                            Grade Assessment: III - A patient with severe  systemic disease. After reviewing the risks and                            benefits, the patient was deemed in satisfactory                            condition to undergo the procedure.                           After obtaining informed consent, the colonoscope                            was passed under direct vision. Throughout the   procedure, the patient's blood pressure, pulse, and                            oxygen saturations were monitored continuously. The                            EC-[id number] ([id number] scope was introduced through                            the anus and advanced to the the cecum, identified                            by appendiceal orifice and ileocecal valve.                            Anatomical landmarks were photographed. The quality                            of the bowel preparation was adequate. The bowel                            preparation used was Miralax. Scope In: 12:16:14 PM Scope Out: 12:25:41 PM Scope Withdrawal Time: 0 hours 6 minutes 10 seconds  Total Procedure Duration: 0 hours 9 minutes 27 seconds  Findings:      Multiple small-mouthed diverticula were found in the colon. There was no       evidence of diverticular bleeding.      Non-bleeding internal hemorrhoids were found during retroflexion. The       hemorrhoids were small.      The exam was otherwise without abnormality on direct and retroflexion       views. Impression:                Moderate Sedation:      Please see anesthesia notes, moderate sedation not given Recommendation:           - Repeat colonoscopy in 10 years for screening                            purposes.                           - Resume heparin today at prior dose.                           -  START OTHER ORAL ANTICOAGULANT ANYTIME                           - Return patient to hospital ward for ongoing care.                           - Continue present medications.                           - Resume regular diet. Procedure Code(s):        --- Professional ---                           [id number], Colonoscopy, flexible; diagnostic, including                            collection of specimen(s) by brushing or washing,                            when performed (separate procedure) Diagnosis Code(s):        --- Professional ---                            D50.9, Iron deficiency anemia, unspecified CPT copyright 2016 American Medical Association. All rights reserved. The codes documented in this report are preliminary and upon coder review may  be revised to meet current compliance requirements. Iva Boop, MD 11/16/2015 12:59:17 PM This report has been signed electronically. Number of Addenda: 0

## 2015-11-16 NOTE — Progress Notes (Signed)
Pt returned from endo in stable condition. VSS. Pt denies pain. No signs of bleeding  A-Line dressing to right wrist is clean, dry & intact. Will continue to monitor pt. Bed remains in lowest position and call bell/phone is within reach.

## 2015-11-16 NOTE — Anesthesia Postprocedure Evaluation (Signed)
Anesthesia Post Note  Patient: Christine Hicks  Procedure(s) Performed: Procedure(s) (LRB): COLONOSCOPY (N/A) ESOPHAGOGASTRODUODENOSCOPY (EGD) (N/A)  Patient location during evaluation: PACU Anesthesia Type: MAC Level of consciousness: awake and alert Pain management: pain level controlled Vital Signs Assessment: post-procedure vital signs reviewed and stable Respiratory status: spontaneous breathing, nonlabored ventilation, respiratory function stable and patient connected to nasal cannula oxygen Cardiovascular status: stable and blood pressure returned to baseline Anesthetic complications: no    Last Vitals:  Filed Vitals:   11/16/15 1250 11/16/15 1300  BP: 109/53 109/43  Pulse: 96 91  Temp:    Resp: 22 17    Last Pain:  Filed Vitals:   11/16/15 1307  PainSc: 0-No pain                 Christine Hicks, Christine Hicks

## 2015-11-16 NOTE — Progress Notes (Addendum)
ANTICOAGULATION CONSULT NOTE   Pharmacy Consult for heparin Indication: acute R DVT  No Known Allergies  Patient Measurements: Height: 5\' 5"  (165.1 cm) Weight: 284 lb 2.8 oz (128.9 kg) IBW/kg (Calculated) : 57 Heparin Dosing Weight: 87 kg  Vital Signs: Temp: 97.8 F (36.6 C) (04/21 0537) Temp Source: Oral (04/21 0537) BP: 112/45 mmHg (04/21 0537) Pulse Rate: 78 (04/21 0537)  Labs:  Recent Labs  11/14/15 0740 11/15/15 0525 11/16/15 0502  HGB 9.1*  --   --   HCT 32.9*  --   --   PLT 178  --   --   HEPARINUNFRC 0.55 0.46 0.46    Estimated Creatinine Clearance: 100 mL/min (by C-G formula based on Cr of 0.69).   Assessment: 60 y/o obese female admitted on 11/10/2015 with progressive dyspnea and edema in LE. She was found to have symptomatic anemia as evidenced by her low Hb, iron, and Tsat. She reports no bleeding although FOB was positive on 4/16. Bilateral LE venous duplex reporting acute R DVT. CTA chest negative for PE. Pharmacy consulted to begin heparin at low dose due to anemia.  HL remains therapeutic this morning. No bleeding noted. Hb improved to 9.1, platelets stable- no bleeding noted.  Heparin infusion to stop today at 0900 in preparation for colonoscopy.  Goal of Therapy:  Heparin level 0.3-0.5 units/ml due to anemia Monitor platelets by anticoagulation protocol: Yes   Plan:  - Continue heparin at 1750 units/hr until 0900 - F/u restart heparin vs starting oral agent after colonoscopy - Daily HL and CBC - Monitor for s/sx of bleeding - Patient on PO iron- ?need for IV repletion while in the hospital  Krystian Younglove D. Amrit Erck, PharmD, BCPS Clinical Pharmacist Pager: [phone] 11/16/2015 8:26 AM  ADDENDUM Cleared to resume heparin post-GI procedures.  Plan: -restart heparin at previous rate- 1750 units/hr -daily HL and CBC -follow for addition/change to oral agent  Amsi Grimley D. Julionna Marczak, PharmD, BCPS Clinical Pharmacist Pager: [phone] 11/16/2015 1:21 PM

## 2015-11-16 NOTE — Transfer of Care (Signed)
Immediate Anesthesia Transfer of Care Note  Patient: Christine PattenJudith M Hicks  Procedure(s) Performed: Procedure(s): COLONOSCOPY (N/A) ESOPHAGOGASTRODUODENOSCOPY (EGD) (N/A)  Patient Location: PACU and Endoscopy Unit  Anesthesia Type:MAC  Level of Consciousness: awake, alert , oriented and patient cooperative  Airway & Oxygen Therapy: Patient Spontanous Breathing and Patient connected to nasal cannula oxygen  Post-op Assessment: Report given to RN, Post -op Vital signs reviewed and stable and Patient moving all extremities  Post vital signs: Reviewed and stable  Last Vitals:  Filed Vitals:   11/16/15 1046 11/16/15 1242  BP: 129/49 101/65  Pulse:  94  Temp: 36.9 C   Resp: 25 22    Complications: No apparent anesthesia complications

## 2015-11-17 DIAGNOSIS — I350 Nonrheumatic aortic (valve) stenosis: Secondary | ICD-10-CM | POA: Diagnosis not present

## 2015-11-17 DIAGNOSIS — Q231 Congenital insufficiency of aortic valve: Secondary | ICD-10-CM | POA: Diagnosis not present

## 2015-11-17 DIAGNOSIS — D509 Iron deficiency anemia, unspecified: Secondary | ICD-10-CM | POA: Diagnosis not present

## 2015-11-17 DIAGNOSIS — R0600 Dyspnea, unspecified: Secondary | ICD-10-CM | POA: Diagnosis not present

## 2015-11-17 LAB — CBC
HCT: 41.7 % (ref 36.0–46.0)
Hemoglobin: 11.1 g/dL — ABNORMAL LOW (ref 12.0–15.0)
MCH: 20.4 pg — AB (ref 26.0–34.0)
MCHC: 26.6 g/dL — AB (ref 30.0–36.0)
MCV: 76.8 fL — ABNORMAL LOW (ref 78.0–100.0)
PLATELETS: 195 10*3/uL (ref 150–400)
RBC: 5.43 MIL/uL — AB (ref 3.87–5.11)
RDW: 26.6 % — AB (ref 11.5–15.5)
WBC: 8.1 10*3/uL (ref 4.0–10.5)

## 2015-11-17 LAB — BASIC METABOLIC PANEL
ANION GAP: 15 (ref 5–15)
BUN: 10 mg/dL (ref 6–20)
CALCIUM: 9.6 mg/dL (ref 8.9–10.3)
CO2: 18 mmol/L — ABNORMAL LOW (ref 22–32)
CREATININE: 0.83 mg/dL (ref 0.44–1.00)
Chloride: 102 mmol/L (ref 101–111)
GFR calc Af Amer: >60 mL/min (ref >60)
GLUCOSE: 110 mg/dL — AB (ref 65–99)
Potassium: 4 mmol/L (ref 3.5–5.1)
Sodium: 135 mmol/L (ref 135–145)

## 2015-11-17 LAB — TYPE AND SCREEN
ABO/RH(D): O POS
Antibody Screen: NEGATIVE
DONOR AG TYPE: NEGATIVE
Unit division: 0

## 2015-11-17 LAB — HEPARIN LEVEL (UNFRACTIONATED): Heparin Unfractionated: 0.64 IU/mL (ref 0.30–0.70)

## 2015-11-17 MED ORDER — APIXABAN 5 MG PO TABS
10.0000 mg | ORAL_TABLET | Freq: Two times a day (BID) | ORAL | Status: DC
  Administered 2015-11-17 – 2015-11-18 (×3): 10 mg via ORAL
  Filled 2015-11-17 (×3): qty 2

## 2015-11-17 MED ORDER — APIXABAN 5 MG PO TABS: 5.0000 mg | ORAL_TABLET | Freq: Two times a day (BID) | ORAL | Status: DC

## 2015-11-17 NOTE — Care Management Note (Signed)
Case Management Note  Patient Details  Name: Archie PattenJudith M Sindelar MRN: [id number] Date of Birth: 11/07/1955  Subjective/Objective:                  Shortness of breath  Action/Plan: CM spoke with patient's physician. The plan is for discharge home tomorrow. Patient is starting Eliquis today. Eliquis $10 co-pay card given to patient.   Expected Discharge Date:     11/18/15             Expected Discharge Plan:  Home/Self Care (Lives with husband/son)  In-House Referral:     Discharge planning Services  CM Consult  Post Acute Care Choice:    Choice offered to:  Patient  DME Arranged:    DME Agency:     HH Arranged:    HH Agency:  [facility]  Status of Service:  In process, will continue to follow  Medicare Important Message Given:    Date Medicare IM Given:    Medicare IM give by:    Date Additional Medicare IM Given:    Additional Medicare Important Message give by:     If discussed at Long Length of Stay Meetings, dates discussed:    Additional Comments:  Antony HasteBennett, Nancy Arvin Harris, RN 11/17/2015, 2:41 PM

## 2015-11-17 NOTE — Progress Notes (Signed)
Pt ambulated 340' in hallway, standby assistance with walker. Pt tolerated well. No signs of sob. At the end of walk pt stated, "I'm not short of breath, i'm just a little tired now." Assisted pt back to bed. Bed left in lowest position and call bell/phone within reach. Pt has no complaints a this time. Will continue to monitor.

## 2015-11-17 NOTE — Progress Notes (Signed)
ANTICOAGULATION CONSULT NOTE   Pharmacy Consult for heparin Indication: acute R DVT  No Known Allergies  Patient Measurements: Height: 5\' 5"  (165.1 cm) Weight: 275 lb 9.2 oz (125 kg) IBW/kg (Calculated) : 57 Heparin Dosing Weight: 87 kg  Vital Signs: Temp: 98.7 F (37.1 C) (04/22 0505) Temp Source: Oral (04/22 0505) BP: 99/42 mmHg (04/22 1036) Pulse Rate: 86 (04/22 1036)  Labs:  Recent Labs  11/15/15 0525 11/16/15 0502 11/17/15 0828  HGB  --   --  11.1*  HCT  --   --  41.7  PLT  --   --  195  HEPARINUNFRC 0.46 0.46 0.64  CREATININE  --   --  0.83    Estimated Creatinine Clearance: 94.6 mL/min (by C-G formula based on Cr of 0.83).   Assessment: 60 y/o obese female admitted on 11/10/2015 with progressive dyspnea and edema in LE. She was found to have symptomatic anemia as evidenced by her low Hb, iron, and Tsat. She reports no bleeding although FOB was positive on 4/16. Bilateral LE venous duplex reporting acute R DVT. CTA chest negative for PE. Pharmacy consulted to begin heparin at low dose due to anemia.  Heparin resumed after EGD/Colonoscopy on 4/21. Level this morning slightly elevated for patient's goal at 0.64 units/mL. Hgb 11.1, plts stable. No bleeding noted.  Goal of Therapy:  Heparin level 0.3-0.5 units/ml due to anemia Monitor platelets by anticoagulation protocol: Yes   Plan:  - Decrease heparin to 1650 units/hr - F/u starting oral agent - Daily HL and CBC - Monitor for s/sx of bleeding - Patient with low iron stores.Marland Kitchen.Marland Kitchen.?need for IV repletion while in the hospital  Alaine Loughney D. Younes Degeorge, PharmD, BCPS Clinical Pharmacist Pager: [phone] 11/17/2015 11:02 AM

## 2015-11-17 NOTE — Progress Notes (Signed)
PROGRESS NOTE  Christine Hicks WJX:[id number] DOB: [birth year] DOA: 11/10/2015 PCP: Billee Cashing, MD Outpatient Specialists:    Brief Narrative: Patient is a 60 year old female with a past medical history of bicuspid aortic valve, hypertension, hypothyroidism who presented with exertional dyspnea. Found to have significant amount of anemia with hemoglobin of 7.2, anemia panel consistent with iron deficiency, FOBT positive. Furthermore, found to have swelling in her right lower extremity, Doppler positive for DVT. EGD and Colonoscopy negative for active bleeding.  Subjective: No new complaints. Wants to ambulate and see how she does today. Still on heparin infusion.  Assessment & Plan: Principal Problem: Exertional dyspnea: Improved. Suspect secondary to iron deficiency. No volume overload or CHF evident on exam-Echo showed severe AS. Cardiology consulted and determined that she will need definitive therapy in the future, await further recommendations from cardiology prior to discharge.   Active Problems: Iron deficiency anemia: Transfused 2 units of PRBC on 4/17, hemoglobin currently stable. GI following. EGD and colonoscopy results pending.  Right lower extremity DVT: Mostly has a sedentary lifestyle, this is his second episode of DVT-she claims she had a prior episode of DVT in 2001/2002 when she was hospitalized with multiple surgical complications. She was started on intravenous IV heparin,  all her endoscopic procedures,  we will transition her to Eliquis. Option of Coumadin versus DOAC's were discussed with patient, she would like to try Eliquis. She was previously on Coumadin in 2001/2002.  She will need outpatient follow-up with hematology to determine adequate duration of anticoagulation-as this is a second episode-although her first episode in 2001/2002 was likely a provoked DVT given prolonged hospitalization and numerous surgeries.  Aortic stenosis with bicuspid aortic valve:  Echocardiogram showed severe AS. Cardiology was consulted, await further recommendations from cardiology prior to discharge. Ambulated today.    Hypothyroidism: Continue levothyroxine  Hypertension: Controlled, continue with benazepril and Lasix  History of bilateral varicose veins and bilateral chronic venous stasis changes: Doubt cellulitis, Keflex has been discontinued  Mildly prolonged QTC: But in the setting of RBBB. Monitor closely.  Morbid obesity: Counseled regarding importance of weight loss  DVT prophylaxis: Eliquis Code Status: Full Family Communication: No family at bedside Disposition Plan: Home likely tomorrow-when cleared by cardiology  Consultants:  Gastroenterology Cardiology  Procedures:  Echo: severe aortic stenosis EGD and Colonoscopy: results pending  Antimicrobials: Keflex  Objective: Filed Vitals:   11/17/15 0447 11/17/15 0505 11/17/15 1036 11/17/15 1359  BP:  111/34 99/42 125/55  Pulse:  81 86 94  Temp:  98.7 F (37.1 C)  98.3 F (36.8 C)  TempSrc:  Oral  Oral  Resp:  17  17  Height:      Weight: 125 kg (275 lb 9.2 oz)     SpO2:  98%  98%    Intake/Output Summary (Last 24 hours) at 11/17/15 1614 Last data filed at 11/17/15 1022  Gross per 24 hour  Intake   87.5 ml  Output   1450 ml  Net -1362.5 ml   Filed Weights   11/15/15 0525 11/16/15 0537 11/17/15 0447  Weight: 122.9 kg (270 lb 15.1 oz) 128.9 kg (284 lb 2.8 oz) 125 kg (275 lb 9.2 oz)    Examination:  Filed Vitals:   11/17/15 0447 11/17/15 0505 11/17/15 1036 11/17/15 1359  BP:  111/34 99/42 125/55  Pulse:  81 86 94  Temp:  98.7 F (37.1 C)  98.3 F (36.8 C)  TempSrc:  Oral  Oral  Resp:  17  17  Height:      Weight: 125 kg (275 lb 9.2 oz)     SpO2:  98%  98%   Gen Exam: Awake and alert with clear speech. Not in any distress  Neck: Supple, No JVD.  Chest: B/L Clear.  CVS: S1 S2 Regular,Plus systolic murmur Abdomen: soft, BS +, non tender, non distended.   Extremities: no edema, lower extremities warm to touch. Numerous varicose veins bilaterally, chronic venous stasis changes present. Neurologic: Non Focal.  Skin: No Rash or lesions   Data Reviewed: I have personally reviewed following labs and imaging studies  CBC:  Recent Labs Lab 11/10/15 2350 11/11/15 2213 11/12/15 0514 11/13/15 0640 11/14/15 0740 11/17/15 0828  WBC 10.7*  --  10.1 11.3* 12.9* 8.1  NEUTROABS 6.8  --  6.3  --   --   --   HGB 7.2* 7.6* 7.6* 8.6* 9.1* 11.1*  HCT 28.0* 28.6* 27.7* 31.6* 32.9* 41.7  MCV 67.0*  --  67.7* 69.8* 72.3* 76.8*  PLT 183  --  165 158 178 195   Basic Metabolic Panel:  Recent Labs Lab 11/10/15 2350 11/12/15 0514 11/13/15 0640 11/17/15 0828  NA 135 137 138 135  K 3.7 3.4* 3.6 4.0  CL 105 103 105 102  CO2 20* 25 22 18*  GLUCOSE 113* 113* 101* 110*  BUN 9 8 9 10   CREATININE 0.70 0.74 0.69 0.83  CALCIUM 8.7* 8.9 8.8* 9.6   GFR: Estimated Creatinine Clearance: 94.6 mL/min (by C-G formula based on Cr of 0.83). Liver Function Tests:  Recent Labs Lab 11/11/15 0548 11/12/15 0514 11/13/15 0640  AST  --  27 25  ALT  --  23 19  ALKPHOS  --  67 69  BILITOT 0.6 1.1 0.9  PROT  --  6.5 6.8  ALBUMIN  --  3.1* 3.2*   Coagulation Profile:  Recent Labs Lab 11/10/15 2350 11/12/15 0514 11/13/15 0640  INR 1.18 1.34 1.45   Urine analysis:    Component Value Date/Time   COLORURINE YELLOW 06/22/2010 2055   APPEARANCEUR HAZY* 06/22/2010 2055   LABSPEC 1.010 06/22/2010 2055   PHURINE 5.0 06/22/2010 2055   GLUCOSEU NEGATIVE 06/22/2010 2055   HGBUR NEGATIVE 06/22/2010 2055   BILIRUBINUR NEGATIVE 06/22/2010 2055   KETONESUR NEGATIVE 06/22/2010 2055   PROTEINUR NEGATIVE 06/22/2010 2055   UROBILINOGEN 0.2 06/22/2010 2055   NITRITE NEGATIVE 06/22/2010 2055   LEUKOCYTESUR  06/22/2010 2055    NEGATIVE MICROSCOPIC NOT DONE ON URINES WITH NEGATIVE PROTEIN, BLOOD, LEUKOCYTES, NITRITE, OR GLUCOSE <1000 mg/dL.   Sepsis  Labs:  Recent Results (from the past 240 hour(s))  Culture, blood (routine x 2)     Status: None   Collection Time: 11/11/15  5:45 AM  Result Value Ref Range Status   Specimen Description BLOOD RIGHT ANTECUBITAL  Final   Special Requests BOTTLES DRAWN AEROBIC AND ANAEROBIC 10CC  Final   Culture NO GROWTH 5 DAYS  Final   Report Status 11/16/2015 FINAL  Final  Culture, blood (routine x 2)     Status: None   Collection Time: 11/11/15  6:00 AM  Result Value Ref Range Status   Specimen Description BLOOD RIGHT HAND  Final   Special Requests BOTTLES DRAWN AEROBIC AND ANAEROBIC 10CC  Final   Culture NO GROWTH 5 DAYS  Final   Report Status 11/16/2015 FINAL  Final     Scheduled Meds: . sodium chloride  10 mL/hr Intravenous Once  . sodium chloride   Intravenous Once  .  apixaban  10 mg Oral BID   Followed by  . [START ON 11/24/2015] apixaban  5 mg Oral BID  . benazepril  20 mg Oral Daily  . furosemide  40 mg Oral BID  . levothyroxine  100 mcg Oral QAC breakfast  . potassium chloride SA  20 mEq Oral Daily   Continuous Infusions:   Time spent: 25 minutes  Jeoffrey Massed, MD [facility] Pager:[phone]  If 7PM-7AM, please contact night-coverage [email or web address] Password [facility] 11/17/2015, 4:14 PM

## 2015-11-17 NOTE — Progress Notes (Signed)
ADDENDUM Now to transition to apixaban.  Plan: -apixaban 10mg  PO BID x7 days, followed by 5mg  PO BID starting on 4/29 -continue heparin until first dose of Eliquis is given at 1400. Heparin to be discontinued at same time Eliquis dose is given -CBC q72h -education prior to discharge  Vernis Eid D. Ej Pinson, PharmD, BCPS Clinical Pharmacist Pager: [phone] 11/17/2015 1:03 PM     ANTICOAGULATION CONSULT NOTE   Pharmacy Consult for heparin Indication: acute R DVT  No Known Allergies  Patient Measurements: Height: 5\' 5"  (165.1 cm) Weight: 275 lb 9.2 oz (125 kg) IBW/kg (Calculated) : 57 Heparin Dosing Weight: 87 kg  Vital Signs: Temp: 98.7 F (37.1 C) (04/22 0505) Temp Source: Oral (04/22 0505) BP: 99/42 mmHg (04/22 1036) Pulse Rate: 86 (04/22 1036)  Labs:  Recent Labs  11/15/15 0525 11/16/15 0502 11/17/15 0828  HGB  --   --  11.1*  HCT  --   --  41.7  PLT  --   --  195  HEPARINUNFRC 0.46 0.46 0.64  CREATININE  --   --  0.83    Estimated Creatinine Clearance: 94.6 mL/min (by C-G formula based on Cr of 0.83).   Assessment: 60 y/o obese female admitted on 11/10/2015 with progressive dyspnea and edema in LE. She was found to have symptomatic anemia as evidenced by her low Hb, iron, and Tsat. She reports no bleeding although FOB was positive on 4/16. Bilateral LE venous duplex reporting acute R DVT. CTA chest negative for PE. Pharmacy consulted to begin heparin at low dose due to anemia.  Heparin resumed after EGD/Colonoscopy on 4/21. Level this morning slightly elevated for patient's goal at 0.64 units/mL. Hgb 11.1, plts stable. No bleeding noted.  Goal of Therapy:  Heparin level 0.3-0.5 units/ml due to anemia Monitor platelets by anticoagulation protocol: Yes   Plan:  - Decrease heparin to 1650 units/hr - F/u starting oral agent - Daily HL and CBC - Monitor for s/sx of bleeding - Patient with low iron stores.Marland Kitchen.Marland Kitchen.?need for IV repletion while in the  hospital  Jillaine Waren D. Klinton Candelas, PharmD, BCPS Clinical Pharmacist Pager: [phone] 11/17/2015 1:01 PM

## 2015-11-18 DIAGNOSIS — I5031 Acute diastolic (congestive) heart failure: Secondary | ICD-10-CM

## 2015-11-18 DIAGNOSIS — I350 Nonrheumatic aortic (valve) stenosis: Secondary | ICD-10-CM

## 2015-11-18 DIAGNOSIS — D649 Anemia, unspecified: Secondary | ICD-10-CM | POA: Diagnosis not present

## 2015-11-18 DIAGNOSIS — R0600 Dyspnea, unspecified: Secondary | ICD-10-CM | POA: Diagnosis not present

## 2015-11-18 DIAGNOSIS — I82401 Acute embolism and thrombosis of unspecified deep veins of right lower extremity: Secondary | ICD-10-CM

## 2015-11-18 DIAGNOSIS — D509 Iron deficiency anemia, unspecified: Secondary | ICD-10-CM | POA: Diagnosis not present

## 2015-11-18 LAB — CBC
HCT: 32.8 % — ABNORMAL LOW (ref 36.0–46.0)
HEMOGLOBIN: 9.2 g/dL — AB (ref 12.0–15.0)
MCH: 21.2 pg — AB (ref 26.0–34.0)
MCHC: 28 g/dL — ABNORMAL LOW (ref 30.0–36.0)
MCV: 75.8 fL — ABNORMAL LOW (ref 78.0–100.0)
PLATELETS: 178 10*3/uL (ref 150–400)
RBC: 4.33 MIL/uL (ref 3.87–5.11)
RDW: 26.8 % — ABNORMAL HIGH (ref 11.5–15.5)
WBC: 5.7 10*3/uL (ref 4.0–10.5)

## 2015-11-18 MED ORDER — ZOLPIDEM TARTRATE 5 MG PO TABS: 5.0000 mg | ORAL_TABLET | Freq: Every evening | ORAL | Status: DC | PRN

## 2015-11-18 MED ORDER — PANTOPRAZOLE SODIUM 40 MG PO TBEC: 40.0000 mg | DELAYED_RELEASE_TABLET | Freq: Every day | ORAL | Status: DC

## 2015-11-18 MED ORDER — APIXABAN 5 MG PO TABS: 5.0000 mg | ORAL_TABLET | Freq: Two times a day (BID) | ORAL | Status: DC

## 2015-11-18 MED ORDER — FERROUS SULFATE 325 (65 FE) MG PO TABS: 325.0000 mg | ORAL_TABLET | Freq: Two times a day (BID) | ORAL | Status: DC

## 2015-11-18 MED ORDER — APIXABAN 5 MG PO TABS: 10.0000 mg | ORAL_TABLET | Freq: Two times a day (BID) | ORAL | Status: DC

## 2015-11-18 NOTE — Progress Notes (Signed)
SUBJECTIVE: No shortness of breath, less leg swelling. Has questions regarding her aortic stenosis.   ROS: Other than pertinent positives in "Subjective", all others were reviewed and found to be negative.   Intake/Output Summary (Last 24 hours) at 11/18/15 0954 Last data filed at 11/18/15 [id number]  Gross per 24 hour  Intake      0 ml  Output   1650 ml  Net  -1650 ml    Current Facility-Administered Medications  Medication Dose Route Frequency Provider Last Rate Last Dose  . 0.9 %  sodium chloride infusion  10 mL/hr Intravenous Once Kristen N Ward, DO      . 0.9 %  sodium chloride infusion   Intravenous Once Rhetta MuraJai-Gurmukh Samtani, MD      . acetaminophen (TYLENOL) tablet 650 mg  650 mg Oral Q6H PRN Alberteen Samhristopher P Danford, MD   650 mg at 11/12/15 2350   Or  . acetaminophen (TYLENOL) suppository 650 mg  650 mg Rectal Q6H PRN Alberteen Samhristopher P Danford, MD      . albuterol (PROVENTIL) (2.5 MG/3ML) 0.083% nebulizer solution 2.5 mg  2.5 mg Nebulization Q2H PRN Alberteen Samhristopher P Danford, MD      . apixaban (ELIQUIS) tablet 10 mg  10 mg Oral BID Lauren D Bajbus, RPH   10 mg at 11/18/15 0839   Followed by  . [START ON 11/24/2015] apixaban (ELIQUIS) tablet 5 mg  5 mg Oral BID Lauren D Bajbus, RPH      . benazepril (LOTENSIN) tablet 20 mg  20 mg Oral Daily Alberteen Samhristopher P Danford, MD   20 mg at 11/18/15 0839  . furosemide (LASIX) tablet 40 mg  40 mg Oral BID Rhetta MuraJai-Gurmukh Samtani, MD   40 mg at 11/18/15 0538  . guaiFENesin (MUCINEX) 12 hr tablet 600 mg  600 mg Oral BID PRN Alberteen Samhristopher P Danford, MD      . levothyroxine (SYNTHROID, LEVOTHROID) tablet 100 mcg  100 mcg Oral QAC breakfast Rhetta MuraJai-Gurmukh Samtani, MD   100 mcg at 11/18/15 0839  . metoCLOPramide (REGLAN) injection 10 mg  10 mg Intravenous Q6H PRN Dianah FieldSarah J Gribbin, PA-C      . potassium chloride SA (K-DUR,KLOR-CON) CR tablet 20 mEq  20 mEq Oral Daily Alberteen Samhristopher P Danford, MD   20 mEq at 11/18/15 [id number]  . senna-docusate (Senokot-S) tablet 1 tablet  1  tablet Oral QHS PRN Alberteen Samhristopher P Danford, MD      . zolpidem (AMBIEN) tablet 5 mg  5 mg Oral QHS PRN Alberteen Samhristopher P Danford, MD   5 mg at 11/14/15 2241    Filed Vitals:   11/17/15 1359 11/17/15 2224 11/18/15 0412 11/18/15 0447  BP: 125/55 121/53  129/62  Pulse: 94 88  85  Temp: 98.3 F (36.8 C) 98.3 F (36.8 C)  99.6 F (37.6 C)  TempSrc: Oral Oral  Oral  Resp: 17 17  17   Height:      Weight:   263 lb (119.296 kg)   SpO2: 98% 99%  99%    PHYSICAL EXAM Gen Exam: Awake and alert with clear speech. Not in any distress  Neck: Supple, No JVD.  Chest: B/L Clear.  CVS: S1 S2 Regular, 3/6 ejection systolic murmur over RUSB Abdomen: soft, BS +, morbidly obese. Extremities: Trace pretibial edema b/l. Varicose veins b/l with chronic venous stasis changes present. Neurologic: Non Focal.  Skin: No Rash or lesions      LABS: Basic Metabolic Panel:  Recent Labs  54/03/8103/22/17 0828  NA  135  K 4.0  CL 102  CO2 18*  GLUCOSE 110*  BUN 10  CREATININE 0.83  CALCIUM 9.6   Liver Function Tests: No results for input(s): AST, ALT, ALKPHOS, BILITOT, PROT, ALBUMIN in the last 72 hours. No results for input(s): LIPASE, AMYLASE in the last 72 hours. CBC:  Recent Labs  11/17/15 0828 11/18/15 0558  WBC 8.1 5.7  HGB 11.1* 9.2*  HCT 41.7 32.8*  MCV 76.8* 75.8*  PLT 195 178   Cardiac Enzymes: No results for input(s): CKTOTAL, CKMB, CKMBINDEX, TROPONINI in the last 72 hours. BNP: Invalid input(s): POCBNP D-Dimer: No results for input(s): DDIMER in the last 72 hours. Hemoglobin A1C: No results for input(s): HGBA1C in the last 72 hours. Fasting Lipid Panel: No results for input(s): CHOL, HDL, LDLCALC, TRIG, CHOLHDL, LDLDIRECT in the last 72 hours. Thyroid Function Tests: No results for input(s): TSH, T4TOTAL, T3FREE, THYROIDAB in the last 72 hours.  Invalid input(s): FREET3 Anemia Panel: No results for input(s): VITAMINB12, FOLATE, FERRITIN, TIBC, IRON, RETICCTPCT in the  last 72 hours.  RADIOLOGY: Dg Chest 2 View  11/12/2015  CLINICAL DATA:  60 year old female with history of shortness breath for the past 2 weeks. Congestive heart failure. EXAM: CHEST  2 VIEW COMPARISON:  Chest x-ray 09/18/2009. FINDINGS: Lung volumes are normal. No consolidative airspace disease. No pleural effusions. Mild cephalization of the pulmonary vasculature, without frank pulmonary edema. No pneumothorax. No suspicious appearing pulmonary nodules or masses. Heart size is mildly enlarged. Upper mediastinal contours are within normal limits. Atherosclerosis in the thoracic aorta. IMPRESSION: 1. Mild cardiomegaly with pulmonary venous congestion, but no frank pulmonary edema. 2. Atherosclerosis. Electronically Signed   By: Trudie Reed M.D.   On: 11/12/2015 08:02   Ct Angio Chest Pe W/cm &/or Wo Cm  11/11/2015  CLINICAL DATA:  Dyspnea, onset 1 week ago. Productive cough for 3 weeks. Bilateral lower extremity swelling, worsened over the past 2 days. EXAM: CT ANGIOGRAPHY CHEST WITH CONTRAST TECHNIQUE: Multidetector CT imaging of the chest was performed using the standard protocol during bolus administration of intravenous contrast. Multiplanar CT image reconstructions and MIPs were obtained to evaluate the vascular anatomy. CONTRAST:  80 mL Isovue 370 intravenous COMPARISON:  None. FINDINGS: Cardiovascular: There is good opacification of the pulmonary arteries. There is no pulmonary embolism. The thoracic aorta is normal in caliber and intact. There is enlargement of all cardiac chambers. Lungs: Mild linear basilar scarring or atelectasis. Otherwise clear. Central airways: Patent Effusions: None Lymphadenopathy: None Esophagus: Unremarkable Upper abdomen: Small hiatal hernia Musculoskeletal: No significant skeletal lesion. Review of the MIP images confirms the above findings. Mild motion degradation of the images. IMPRESSION: Negative for acute pulmonary embolism. There is a small hiatal hernia. There  is enlargement of all cardiac chambers. Mild motion degradation of the images. Electronically Signed   By: Ellery Plunk M.D.   On: 11/11/2015 02:28      ASSESSMENT AND PLAN: 1. Severe aortic stenosis: LV systolic function is normal. Symptoms while hospitalized felt secondary to anemia and have improved. She will eventually need valve replacement, the timing of which will be left to Dr. Michaelle Copas discretion.  2. Acute diastolic heart failure: Compensated. Continue Lasix 40 mg BID as outpatient.  3. Right LE DVT: On Eliquis.  4. IDA: s/p transfusion. Stable.  5. Essential HTN: Controlled. No changes.  Dispo: No further recommendations. Will sign off.  Prentice Docker, M.D., F.A.C.C.

## 2015-11-18 NOTE — Discharge Summary (Signed)
PATIENT DETAILS Name: Christine Hicks Age: 60 y.o. Sex: female Date of Birth: 1955/08/15 MRN: [id number]. Admitting Physician: Alberteen Sam, MD WUJ:[id number], Adela Lank, MD  Admit Date: 11/10/2015 Discharge date: 11/18/2015  Recommendations for Outpatient Follow-up:  1. Has severe aortic stenosis-needs outpatient cardiology follow-up  2. Diagnosed with right lower extremity DVT-second episode of DVT-started on Eliquis-please ensure hematology evaluation prior to discontinuing anticoagulation.  3. New Medication-Eliquis, ferrous sulfate  4. Please repeat CBC/BMET at next visit  PRIMARY DISCHARGE DIAGNOSIS:  Principal Problem:   Dyspnea Active Problems:   Hypothyroidism   Essential hypertension   CELLULITIS, LEG, RIGHT   Symptomatic anemia   Bicuspid aortic valve   Prolonged QT interval   Heme + stool   Anemia, iron deficiency   Aortic stenosis      PAST MEDICAL HISTORY: Past Medical History  Diagnosis Date  . Hypertension   . Thyroid disease   . Anemia, iron deficiency     negative egd/colonoscopy 11/16/2015  . Bicuspid aortic valve 11/11/2015    DISCHARGE MEDICATIONS: Current Discharge Medication List    START taking these medications   Details  !! apixaban (ELIQUIS) 5 MG TABS tablet Take 2 tablets (10 mg total) by mouth 2 (two) times daily. Qty: 22 tablet, Refills: 0    !! apixaban (ELIQUIS) 5 MG TABS tablet Take 1 tablet (5 mg total) by mouth 2 (two) times daily. Start from 4/29 Qty: 60 tablet, Refills: 0    ferrous sulfate 325 (65 FE) MG tablet Take 1 tablet (325 mg total) by mouth 2 (two) times daily with a meal. Qty: 60 tablet, Refills: 0     !! - Potential duplicate medications found. Please discuss with provider.    CONTINUE these medications which have CHANGED   Details  zolpidem (AMBIEN) 5 MG tablet Take 1 tablet (5 mg total) by mouth at bedtime as needed for sleep. Qty: 10 tablet, Refills: 0      CONTINUE these medications which have NOT  CHANGED   Details  benazepril (LOTENSIN) 20 MG tablet Take 20 mg by mouth daily.    furosemide (LASIX) 40 MG tablet Take 40 mg by mouth daily.    guaiFENesin (MUCINEX) 600 MG 12 hr tablet Take 600 mg by mouth 2 (two) times daily as needed for cough or to loosen phlegm.    levothyroxine (SYNTHROID, LEVOTHROID) 175 MCG tablet Take 175 mcg by mouth daily before breakfast.    potassium chloride SA (K-DUR,KLOR-CON) 20 MEQ tablet Take 20 mEq by mouth daily.      STOP taking these medications     aspirin 325 MG tablet      ibuprofen (ADVIL,MOTRIN) 600 MG tablet      Phenylephrine-APAP-Guaifenesin (MUCINEX FAST-MAX) 10-650-400 MG/20ML LIQD         ALLERGIES:  No Known Allergies  BRIEF HPI:  See H&P, Labs, Consult and Test reports for all details in brief, Patient is a 60 year old female with a past medical history of bicuspid aortic valve, hypertension, hypothyroidism who presented with exertional dyspnea. Found to have significant amount of anemia with hemoglobin of 7.2, anemia panel consistent with iron deficiency, FOBT positive  CONSULTATIONS:   cardiology and GI  PERTINENT RADIOLOGIC STUDIES: Dg Chest 2 View  11/12/2015  CLINICAL DATA:  60 year old female with history of shortness breath for the past 2 weeks. Congestive heart failure. EXAM: CHEST  2 VIEW COMPARISON:  Chest x-ray 09/18/2009. FINDINGS: Lung volumes are normal. No consolidative airspace disease. No pleural effusions. Mild  cephalization of the pulmonary vasculature, without frank pulmonary edema. No pneumothorax. No suspicious appearing pulmonary nodules or masses. Heart size is mildly enlarged. Upper mediastinal contours are within normal limits. Atherosclerosis in the thoracic aorta. IMPRESSION: 1. Mild cardiomegaly with pulmonary venous congestion, but no frank pulmonary edema. 2. Atherosclerosis. Electronically Signed   By: Trudie Reed M.D.   On: 11/12/2015 08:02   Ct Angio Chest Pe W/cm &/or Wo Cm  11/11/2015   CLINICAL DATA:  Dyspnea, onset 1 week ago. Productive cough for 3 weeks. Bilateral lower extremity swelling, worsened over the past 2 days. EXAM: CT ANGIOGRAPHY CHEST WITH CONTRAST TECHNIQUE: Multidetector CT imaging of the chest was performed using the standard protocol during bolus administration of intravenous contrast. Multiplanar CT image reconstructions and MIPs were obtained to evaluate the vascular anatomy. CONTRAST:  80 mL Isovue 370 intravenous COMPARISON:  None. FINDINGS: Cardiovascular: There is good opacification of the pulmonary arteries. There is no pulmonary embolism. The thoracic aorta is normal in caliber and intact. There is enlargement of all cardiac chambers. Lungs: Mild linear basilar scarring or atelectasis. Otherwise clear. Central airways: Patent Effusions: None Lymphadenopathy: None Esophagus: Unremarkable Upper abdomen: Small hiatal hernia Musculoskeletal: No significant skeletal lesion. Review of the MIP images confirms the above findings. Mild motion degradation of the images. IMPRESSION: Negative for acute pulmonary embolism. There is a small hiatal hernia. There is enlargement of all cardiac chambers. Mild motion degradation of the images. Electronically Signed   By: Ellery Plunk M.D.   On: 11/11/2015 02:28     PERTINENT LAB RESULTS: CBC:  Recent Labs  11/17/15 0828 11/18/15 0558  WBC 8.1 5.7  HGB 11.1* 9.2*  HCT 41.7 32.8*  PLT 195 178   CMET CMP     Component Value Date/Time   NA 135 11/17/2015 0828   K 4.0 11/17/2015 0828   CL 102 11/17/2015 0828   CO2 18* 11/17/2015 0828   GLUCOSE 110* 11/17/2015 0828   BUN 10 11/17/2015 0828   CREATININE 0.83 11/17/2015 0828   CALCIUM 9.6 11/17/2015 0828   PROT 6.8 11/13/2015 0640   ALBUMIN 3.2* 11/13/2015 0640   AST 25 11/13/2015 0640   ALT 19 11/13/2015 0640   ALKPHOS 69 11/13/2015 0640   BILITOT 0.9 11/13/2015 0640   GFRNONAA >60 11/17/2015 0828   GFRAA >60 11/17/2015 0828    GFR Estimated Creatinine  Clearance: 92 mL/min (by C-G formula based on Cr of 0.83). No results for input(s): LIPASE, AMYLASE in the last 72 hours. No results for input(s): CKTOTAL, CKMB, CKMBINDEX, TROPONINI in the last 72 hours. Invalid input(s): POCBNP No results for input(s): DDIMER in the last 72 hours. No results for input(s): HGBA1C in the last 72 hours. No results for input(s): CHOL, HDL, LDLCALC, TRIG, CHOLHDL, LDLDIRECT in the last 72 hours. No results for input(s): TSH, T4TOTAL, T3FREE, THYROIDAB in the last 72 hours.  Invalid input(s): FREET3 No results for input(s): VITAMINB12, FOLATE, FERRITIN, TIBC, IRON, RETICCTPCT in the last 72 hours. Coags: No results for input(s): INR in the last 72 hours.  Invalid input(s): PT Microbiology: Recent Results (from the past 240 hour(s))  Culture, blood (routine x 2)     Status: None   Collection Time: 11/11/15  5:45 AM  Result Value Ref Range Status   Specimen Description BLOOD RIGHT ANTECUBITAL  Final   Special Requests BOTTLES DRAWN AEROBIC AND ANAEROBIC 10CC  Final   Culture NO GROWTH 5 DAYS  Final   Report Status 11/16/2015 FINAL  Final  Culture, blood (routine x 2)     Status: None   Collection Time: 11/11/15  6:00 AM  Result Value Ref Range Status   Specimen Description BLOOD RIGHT HAND  Final   Special Requests BOTTLES DRAWN AEROBIC AND ANAEROBIC 10CC  Final   Culture NO GROWTH 5 DAYS  Final   Report Status 11/16/2015 FINAL  Final   BRIEF HOSPITAL COURSE:  Brief Narrative: Patient is a 60 year old female with a past medical history of bicuspid aortic valve, hypertension, hypothyroidism who presented with exertional dyspnea. Found to have significant amount of anemia with hemoglobin of 7.2, anemia panel consistent with iron deficiency, FOBT positive. 2-D echocardiogram demonstrated severe aortic stenosis. Furthermore, found to have swelling in her right lower extremity, Doppler positive for DVT. EGD and Colonoscopy negative for active bleeding.    Hospital course by problem list: Exertional dyspnea: Secondary to symptomatic anemia, although found to have severe aortic stenosis on 2-D echocardiogram-exertional dyspnea has improved with PRBC transfusion. Hemoglobin stable, evaluated by cardiology, further workup for severe ileus to be done in the outpatient setting.   Active Problems: Iron deficiency anemia:  probably from chronic GI blood loss-underwent both colonoscopy and endoscopy-which did not show any active bleeding. Colonoscopy did show internal hemorrhoids and diverticulosis. EGD did show nonbleeding erosive gastropathy. EGD biopsy results are still pending-piece follow. Was transfused  2 units of PRBC on 4/17, hemoglobin currently stable.   Right lower extremity DVT: Mostly has a sedentary lifestyle, this is his second episode of DVT-she claims she had a prior episode of DVT in 2001/2002 when she was hospitalized with multiple surgical complications (hence likely provoked). She was started on intravenous IV heparin, once all her endoscopic procedures were completed-we transitioned her to Eliquis. Option of Coumadin versus DOAC's were discussed with patient, she would like to try Eliquis. She was previously on Coumadin in 2001/2002. She will need outpatient follow-up with hematology to determine adequate duration of anticoagulation-as this is a second episode-although her first episode in 2001/2002 was likely a provoked DVT given prolonged hospitalization and numerous surgeries.  Aortic stenosis with bicuspid aortic valve: Echocardiogram showed severe AS. Cardiology was consulted, per cardiology okay to discharge-their office will contact patient for further workup as an outpatient. Please note, patient was ambulated in the hallway, and feels much better than on admission.    Chronic diastolic heart failure: Appears compensated. Continue Lasix and benazepril.   Hypothyroidism: Continue levothyroxine  Hypertension: Controlled, continue  with benazepril and Lasix  History of bilateral varicose veins and bilateral chronic venous stasis changes: Doubt cellulitis, Keflex has been discontinued  Mildly prolonged QTC: But in the setting of RBBB. Monitor closely.  Morbid obesity: Counseled regarding importance of weight loss  TODAY-DAY OF DISCHARGE:  Subjective:   Allex Madia today has no headache,no chest abdominal pain,no new weakness tingling or numbness, feels much better wants to go home today.   Objective:   Blood pressure 129/62, pulse 85, temperature 99.6 F (37.6 C), temperature source Oral, resp. rate 17, height  (1.651 m), weight 119.296 kg (263 lb), SpO2 99 %.  Intake/Output Summary (Last 24 hours) at 11/18/15 1042 Last data filed at 11/18/15 0924  Gross per 24 hour  Intake      0 ml  Output   1200 ml  Net  -1200 ml   Filed Weights   11/16/15 0537 11/17/15 0447 11/18/15 0412  Weight: 128.9 kg (284 lb 2.8 oz) 125 kg (275 lb 9.2 oz) 119.296 kg (263 lb)  Exam Awake Alert, Oriented *3, No new F.N deficits, Normal affect [place].AT,PERRAL Supple Neck,No JVD, No cervical lymphadenopathy appriciated.  Symmetrical Chest wall movement, Good air movement bilaterally, CTAB RRR,No Gallops,Rubs or new Murmurs, No Parasternal Heave +ve B.Sounds, Abd Soft, Non tender, No organomegaly appriciated, No rebound -guarding or rigidity. No Cyanosis, Clubbing or edema, No new Rash or bruise  DISCHARGE CONDITION: Stable  DISPOSITION: Home  DISCHARGE INSTRUCTIONS:    Activity:  As tolerated   Get Medicines reviewed and adjusted: Please take all your medications with you for your next visit with your Primary MD  Please request your Primary MD to go over all hospital tests and procedure/radiological results at the follow up, please ask your Primary MD to get all Hospital records sent to his/her office.  If you experience worsening of your admission symptoms, develop shortness of breath, life threatening  emergency, suicidal or homicidal thoughts you must seek medical attention immediately by calling 911 or calling your MD immediately  if symptoms less severe.  You must read complete instructions/literature along with all the possible adverse reactions/side effects for all the Medicines you take and that have been prescribed to you. Take any new Medicines after you have completely understood and accpet all the possible adverse reactions/side effects.   Do not drive when taking Pain medications.   Do not take more than prescribed Pain, Sleep and Anxiety Medications  Special Instructions: If you have smoked or chewed Tobacco  in the last 2 yrs please stop smoking, stop any regular Alcohol  and or any Recreational drug use.  Wear Seat belts while driving.  Please note  You were cared for by a hospitalist during your hospital stay. Once you are discharged, your primary care physician will handle any further medical issues. Please note that NO REFILLS for any discharge medications will be authorized once you are discharged, as it is imperative that you return to your primary care physician (or establish a relationship with a primary care physician if you do not have one) for your aftercare needs so that they can reassess your need for medications and monitor your lab values.   Diet recommendation: Heart Healthy diet  Discharge Instructions    Call MD for:  difficulty breathing, headache or visual disturbances    Complete by:  As directed      Call MD for:  severe uncontrolled pain    Complete by:  As directed      Diet - low sodium heart healthy    Complete by:  As directed      Increase activity slowly    Complete by:  As directed            Follow-up Information    Follow up with Billee CashingMCKENZIE, [name], MD. Schedule an appointment as soon as possible for a visit in 1 week.   Specialty:  Family Medicine   Why:  Hospital follow up   Contact information:   [address] [place] [address] [phone]       Follow up with Lesleigh NoeSMITH III,HENRY W, MD. Schedule an appointment as soon as possible for a visit in 2 weeks.   Specialty:  Cardiology   Why:  Hospital follow up   Contact information:   [address] [place] [address] [phone]      Total Time spent on discharge equals  45 minutes.  SignedJeoffrey Massed: Lori-Ann Lindfors 11/18/2015 10:42 AM

## 2015-11-18 NOTE — Discharge Instructions (Signed)
Information on my medicine - ELIQUIS (apixaban)  This medication education was reviewed with me or my healthcare representative as part of my discharge preparation.   Why was Eliquis prescribed for you? Eliquis was prescribed to treat blood clots that may have been found in the veins of your legs (deep vein thrombosis) or in your lungs (pulmonary embolism) and to reduce the risk of them occurring again.  What do You need to know about Eliquis ? The starting dose is 10 mg (two 5 mg tablets) taken TWICE daily for the FIRST SEVEN (7) DAYS, then on  11/24/15  the dose is reduced to ONE 5 mg tablet taken TWICE daily.  Eliquis may be taken with or without food.   Try to take the dose about the same time in the morning and in the evening. If you have difficulty swallowing the tablet whole please discuss with your pharmacist how to take the medication safely.  Take Eliquis exactly as prescribed and DO NOT stop taking Eliquis without talking to the doctor who prescribed the medication.  Stopping may increase your risk of developing a new blood clot.  Refill your prescription before you run out.  After discharge, you should have regular check-up appointments with your healthcare provider that is prescribing your Eliquis.    What do you do if you miss a dose? If a dose of ELIQUIS is not taken at the scheduled time, take it as soon as possible on the same day and twice-daily administration should be resumed. The dose should not be doubled to make up for a missed dose.  Important Safety Information A possible side effect of Eliquis is bleeding. You should call your healthcare provider right away if you experience any of the following: ? Bleeding from an injury or your nose that does not stop. ? Unusual colored urine (red or dark brown) or unusual colored stools (red or black). ? Unusual bruising for unknown reasons. ? A serious fall or if you hit your head (even if there is no bleeding).  Some  medicines may interact with Eliquis and might increase your risk of bleeding or clotting while on Eliquis. To help avoid this, consult your healthcare provider or pharmacist prior to using any new prescription or non-prescription medications, including herbals, vitamins, non-steroidal anti-inflammatory drugs (NSAIDs) and supplements.  This website has more information on Eliquis (apixaban): http://www.eliquis.com/eliquis/home

## 2015-11-21 ENCOUNTER — Encounter (HOSPITAL_COMMUNITY): Payer: Self-pay | Admitting: Internal Medicine

## 2015-11-26 ENCOUNTER — Encounter: Payer: Self-pay | Admitting: Internal Medicine

## 2015-11-26 NOTE — Progress Notes (Signed)
Quick Note:  Letter created re: gastritis Stay on PPI ______

## 2015-12-04 ENCOUNTER — Encounter: Payer: Self-pay | Admitting: Physician Assistant

## 2015-12-04 ENCOUNTER — Ambulatory Visit (INDEPENDENT_AMBULATORY_CARE_PROVIDER_SITE_OTHER): Payer: BC Managed Care – PPO | Admitting: Physician Assistant

## 2015-12-04 VITALS — BP 108/64 | HR 97 | Ht 65.0 in | Wt 279.0 lb

## 2015-12-04 DIAGNOSIS — I5032 Chronic diastolic (congestive) heart failure: Secondary | ICD-10-CM

## 2015-12-04 DIAGNOSIS — I350 Nonrheumatic aortic (valve) stenosis: Secondary | ICD-10-CM | POA: Diagnosis not present

## 2015-12-04 DIAGNOSIS — D649 Anemia, unspecified: Secondary | ICD-10-CM | POA: Diagnosis not present

## 2015-12-04 LAB — BASIC METABOLIC PANEL
BUN: 14 mg/dL (ref 7–25)
CHLORIDE: 103 mmol/L (ref 98–110)
CO2: 26 mmol/L (ref 20–31)
Calcium: 9 mg/dL (ref 8.6–10.4)
Creat: 0.93 mg/dL (ref 0.50–0.99)
GLUCOSE: 113 mg/dL — AB (ref 65–99)
POTASSIUM: 4.4 mmol/L (ref 3.5–5.3)
SODIUM: 136 mmol/L (ref 135–146)

## 2015-12-04 LAB — CBC
HEMATOCRIT: 38.1 % (ref 35.0–45.0)
HEMOGLOBIN: 11.5 g/dL — AB (ref 11.7–15.5)
MCH: 25.4 pg — ABNORMAL LOW (ref 27.0–33.0)
MCHC: 30.2 g/dL — AB (ref 32.0–36.0)
MCV: 84.1 fL (ref 80.0–100.0)
MPV: 9.6 fL (ref 7.5–12.5)
Platelets: 280 10*3/uL (ref 140–400)
RBC: 4.53 MIL/uL (ref 3.80–5.10)
WBC: 6.3 10*3/uL (ref 3.8–10.8)

## 2015-12-04 MED ORDER — BENAZEPRIL HCL 20 MG PO TABS
10.0000 mg | ORAL_TABLET | Freq: Every day | ORAL | Status: DC
Start: 2015-12-04 — End: 2016-06-12

## 2015-12-04 NOTE — Progress Notes (Signed)
Cardiology Office Note   Date:  12/04/2015   ID:  Christine Hicks, DOB 01/11/1956, MRN [id number]  PCP:  Billee CashingMCKENZIE, [name], MD  Cardiologist:  Was Dr Wall>>Dr Emerson MonteSmith  Alyssabeth Bruster, Bjorn Loserhonda, PA-C    History of Present Illness: Christine Hicks is a 60 y.o. female with a history of HTN, HLD, bicuspid AoV, mod AS  Dc 04/23 after admit for SOB, anemia w/ Hgb 7.2, D-CHF, DVT. AS now severe. s/p GI eval for +FOBT, gastritis seen on EGD, no significant abnormality on colonoscopy. Seen by cards preop and pt high-risk for procedures 2nd AS.  Christine Hicks presents for post hospital follow-up. Her husband and son are with her  Since discharge from the hospital, she has done pretty well. She was not able to weigh herself but has ordered scales and they should be here within the next day or so. Her dyspnea on exertion is improving. She is starting to increase her activity a little bit, walking around the house and doing some household chores. She does not wake with lower extremity edema, but will get it during the day. She has compression stockings but is not currently wearing them.   She has some chronic orthopnea because of her abdominal size. She denies PND. She is interested in getting information on a low sodium heart healthy diet. She is trying to avoid salt. She has not had chest pain, presyncope or syncope. She has not felt her heart skip or race.   Past Medical History  Diagnosis Date  . Hypertension   . Thyroid disease   . Anemia, iron deficiency     negative egd/colonoscopy 11/16/2015  . Bicuspid aortic valve 11/11/2015    Past Surgical History  Procedure Laterality Date  . 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 Hernia repair    . Right leg cellulitis surgery    . Colonoscopy N/A 11/16/2015    Procedure: COLONOSCOPY;  Surgeon: Iva Booparl E Gessner, MD;  [facility];  Service: Endoscopy;  Laterality: N/A;  . Esophagogastroduodenoscopy N/A 11/16/2015      Procedure: ESOPHAGOGASTRODUODENOSCOPY (EGD);  Surgeon: Iva Booparl E Gessner, MD;  [facility];  Service: Endoscopy;  Laterality: N/A;    Current Outpatient Prescriptions  Medication Sig Dispense Refill  . apixaban (ELIQUIS) 5 MG TABS tablet Take 1 tablet (5 mg total) by mouth 2 (two) times daily. Start from 4/29 60 tablet 0  . benazepril (LOTENSIN) 20 MG tablet Take 20 mg by mouth daily.    . ferrous sulfate 325 (65 FE) MG tablet Take 1 tablet (325 mg total) by mouth 2 (two) times daily with a meal. 60 tablet 0  . furosemide (LASIX) 40 MG tablet Take 40 mg by mouth daily.    Marland Kitchen. guaiFENesin (MUCINEX) 600 MG 12 hr tablet Take 600 mg by mouth 2 (two) times daily as needed for cough or to loosen phlegm.    Marland Kitchen. levothyroxine (SYNTHROID, LEVOTHROID) 175 MCG tablet Take 175 mcg by mouth daily before breakfast.    . pantoprazole (PROTONIX) 40 MG tablet Take 1 tablet (40 mg total) by mouth daily. 30 tablet 0  . potassium chloride SA (K-DUR,KLOR-CON) 20 MEQ tablet Take 20 mEq by mouth daily.    Marland Kitchen. zolpidem (AMBIEN) 5 MG tablet Take 1 tablet (5 mg total) by mouth at bedtime as needed for sleep. 10 tablet 0   No current facility-administered medications for this visit.    Allergies:   Review of  patient's allergies indicates not on file.    Social History:  The patient  reports that she has quit smoking. She does not have any smokeless tobacco history on file. She reports that she does not drink alcohol.   Family History:  The patient's family history includes Cardiomyopathy in her son; Emphysema in her father; Heart attack in her mother; Lung cancer in her maternal aunt.    ROS:  Please see the history of present illness. All other systems are reviewed and negative.    PHYSICAL EXAM: VS:  BP 108/64 mmHg  Pulse 97  Ht  (1.651 m)  Wt 279 lb (126.554 kg)  BMI 46.43 kg/m2  LMP  (LMP Unknown) , BMI Body mass index is 46.43 kg/(m^2). GEN: Well nourished, well developed, female in no acute  distress HEENT: normal for age  Neck: no JVD seen but difficult to assess because of body habitus. Hepatojugular reflux is mildly positive. no carotid bruit, no masses Cardiac: RRR; 2/6 AS murmur, no rubs, or gallops Respiratory:  Decreased breath sounds bases bilaterally, normal work of breathing GI: soft, nontender, nondistended, + BS MS: no deformity or atrophy; 1+ edema; distal pulses are 2+ in all upper extremities, mildly decreased in lower extremities due to edema  Skin: warm and dry, no rash Neuro:  Strength and sensation are intact Psych: euthymic mood, full affect   EKG:  EKG is ordered today. 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, heart rate 97   Recent Labs: 11/10/2015: B Natriuretic Peptide 291.3* 11/11/2015: TSH 0.180* 11/13/2015: ALT 19 11/17/2015: BUN 10; Creatinine, Ser 0.83; Potassium 4.0; Sodium 135 11/18/2015: Hemoglobin 9.2*; Platelets 178    Lipid Panel    Component Value Date/Time   CHOL  09/11/2009 0555    124        ATP III CLASSIFICATION:  <200     mg/dL   Desirable  161-096  mg/dL   Borderline High  >=045    mg/dL   High          TRIG 409* 09/11/2009 0555   HDL 14* 09/11/2009 0555   CHOLHDL 8.9 09/11/2009 0555   VLDL 40 09/11/2009 0555   LDLCALC  09/11/2009 0555    70        Total Cholesterol/HDL:CHD Risk Coronary Heart Disease Risk Table                     Men   Women  1/2 Average Risk   3.4   3.3  Average Risk       5.0   4.4  2 X Average Risk   9.6   7.1  3 X Average Risk  23.4   11.0        Use the calculated Patient Ratio above and the CHD Risk Table to determine the patient's CHD Risk.        ATP III CLASSIFICATION (LDL):  <100     mg/dL   Optimal  811-914  mg/dL   Near or Above                    Optimal  130-159  mg/dL   Borderline  782-956  mg/dL   High  >213     mg/dL   Very High     Wt Readings from Last 3 Encounters:  12/04/15 279 lb (126.554 kg)  11/18/15 263 lb (119.296 kg)      Other studies Reviewed: Additional  studies/ records that were reviewed today include: Hospital records and testing.  ASSESSMENT AND PLAN:  1.  Severe aortic stenosis: I discussed options with her. I advised that she would eventually need valve replacement and she is willing to do this. I briefly discussed the testing that would be needed. She is looking valves on line and they are aware that there is a transcutaneous as well as a surgical option. She is willing to do this but wishes to get herself in better shape first.  I encouraged her to stick to a low sodium heart-healthy diet. I encouraged her to increase her activity, albeit at a low level. I advised her that she would not tolerate a higher level of exertion but could build herself up, walking is probably the best.  She is to contact us if she begins to get chest pain, presyncope or syncope. She is to track her weight and let us know if she is having problems with weight gain or additional edema/shortness of breath.  2. Hypertension: Her blood pressure is on the low side of normal, she needs to continue the Lasix so we will decrease the benazepril.  3. Chronic diastolic CHF: We will check a BMET today. Continue Lasix and potassium  4. Anemia: She has not had recent labs, she is tachycardic today. Check a CBC to make sure her hemoglobin and hematocrit are remaining stable.  Current medicines are reviewed at length with the patient today.  The patient does not have concerns regarding medicines.  The following changes have been made:  Decreased benazepril  Labs/ tests ordered today include:   Orders Placed This Encounter  Procedures  . CBC  . Basic Metabolic Panel (BMET)  . EKG 12-Lead     Disposition:   FU with Dr. Katrinka Blazing  Signed, Kordae Buonocore, Deneen Harts  12/04/2015 9:53 AM    [facility] Phone: [phone]; Fax: [phone]  This note was written with the assistance of speech recognition  software. Please excuse any transcriptional errors.

## 2015-12-04 NOTE — Patient Instructions (Addendum)
Medication Instructions:   START TAKING 10 MG OF BENAZEPRIL..  If you need a refill on your cardiac medications before your next appointment, please call your pharmacy.  Labwork: CBC AND BMET    Testing/Procedures:  NONE ORDER TODAY    Follow-Up: WITH DR Katrinka Blazing NEXT AVAILABLE    Any Other Special Instructions Will Be Listed Below (If Applicable).  Low-Sodium Eating Plan Sodium raises blood pressure and causes water to be held in the body. Getting less sodium from food will help lower your blood pressure, reduce any swelling, and protect your heart, liver, and kidneys. We get sodium by adding salt (sodium chloride) to food. Most of our sodium comes from canned, boxed, and frozen foods. Restaurant foods, fast foods, and pizza are also very high in sodium. Even if you take medicine to lower your blood pressure or to reduce fluid in your body, getting less sodium from your food is important.  WHAT IS MY PLAN?  Your health care provider recommends that you limit your sodium intake to __2000____ a day.   WHAT DO I NEED TO KNOW ABOUT THIS EATING PLAN? For the low-sodium eating plan, you will follow these general guidelines:  Choose foods with a % Daily Value for sodium of less than 5% (as listed on the food label).   Use salt-free seasonings or herbs instead of table salt or sea salt.   Check with your health care provider or pharmacist before using salt substitutes.   Eat fresh foods.  Eat more vegetables and fruits.  Limit canned vegetables. If you do use them, rinse them well to decrease the sodium.   Limit cheese to 1 oz (28 g) per day.   Eat lower-sodium products, often labeled as "lower sodium" or "no salt added."  Avoid foods that contain monosodium glutamate (MSG). MSG is sometimes added to [place] food and some canned foods.  Check food labels (Nutrition Facts labels) on foods to learn how much sodium is in one serving.  Eat more home-cooked food and less  restaurant, buffet, and fast food.  When eating at a restaurant, ask that your food be prepared with less salt, or no salt if possible.  HOW DO I READ FOOD LABELS FOR SODIUM INFORMATION? The Nutrition Facts label lists the amount of sodium in one serving of the food. If you eat more than one serving, you must multiply the listed amount of sodium by the number of servings. Food labels may also identify foods as:  Sodium free--Less than 5 mg in a serving.  Very low sodium--35 mg or less in a serving.  Low sodium--140 mg or less in a serving.  Light in sodium--50% less sodium in a serving. For example, if a food that usually has 300 mg of sodium is changed to become light in sodium, it will have 150 mg of sodium.  Reduced sodium--25% less sodium in a serving. For example, if a food that usually has 400 mg of sodium is changed to reduced sodium, it will have 300 mg of sodium. WHAT FOODS CAN I EAT? Grains Low-sodium cereals, including oats, puffed wheat and rice, and shredded wheat cereals. Low-sodium crackers. Unsalted rice and pasta. Lower-sodium bread.  Vegetables Frozen or fresh vegetables. Low-sodium or reduced-sodium canned vegetables. Low-sodium or reduced-sodium tomato sauce and paste. Low-sodium or reduced-sodium tomato and vegetable juices.  Fruits Fresh, frozen, and canned fruit. Fruit juice.  Meat and Other Protein Products Low-sodium canned tuna and salmon. Fresh or frozen meat, poultry, seafood, and fish. Lamb.  Unsalted nuts. Dried beans, peas, and lentils without added salt. Unsalted canned beans. Homemade soups without salt. Eggs.  Dairy Milk. Soy milk. Ricotta cheese. Low-sodium or reduced-sodium cheeses. Yogurt.  Condiments Fresh and dried herbs and spices. Salt-free seasonings. Onion and garlic powders. Low-sodium varieties of mustard and ketchup. Fresh or refrigerated horseradish. Lemon juice.  Fats and Oils Reduced-sodium salad dressings. Unsalted butter.   Other Unsalted popcorn and pretzels.  The items listed above may not be a complete list of recommended foods or beverages. Contact your dietitian for more options. WHAT FOODS ARE NOT RECOMMENDED? Grains Instant hot cereals. Bread stuffing, pancake, and biscuit mixes. Croutons. Seasoned rice or pasta mixes. Noodle soup cups. Boxed or frozen macaroni and cheese. Self-rising flour. Regular salted crackers. Vegetables Regular canned vegetables. Regular canned tomato sauce and paste. Regular tomato and vegetable juices. Frozen vegetables in sauces. Salted JamaicaFrench fries. Olives. Rosita FirePickles. Relishes. Sauerkraut. Salsa. Meat and Other Protein Products Salted, canned, smoked, spiced, or pickled meats, seafood, or fish. Bacon, ham, sausage, hot dogs, corned beef, chipped beef, and packaged luncheon meats. Salt pork. Jerky. Pickled herring. Anchovies, regular canned tuna, and sardines. Salted nuts. Dairy Processed cheese and cheese spreads. Cheese curds. Blue cheese and cottage cheese. Buttermilk.  Condiments Onion and garlic salt, seasoned salt, table salt, and sea salt. Canned and packaged gravies. Worcestershire sauce. Tartar sauce. Barbecue sauce. Teriyaki sauce. Soy sauce, including reduced sodium. Steak sauce. Fish sauce. Oyster sauce. Cocktail sauce. Horseradish that you find on the shelf. Regular ketchup and mustard. Meat flavorings and tenderizers. Bouillon cubes. Hot sauce. Tabasco sauce. Marinades. Taco seasonings. Relishes. Fats and Oils Regular salad dressings. Salted butter. Margarine. Ghee. Bacon fat.  Other Potato and tortilla chips. Corn chips and puffs. Salted popcorn and pretzels. Canned or dried soups. Pizza. Frozen entrees and pot pies.  The items listed above may not be a complete list of foods and beverages to avoid. Contact your dietitian for more information.   This information is not intended to replace advice given to you by your health care provider. Make sure you  discuss any questions you have with your health care provider.   Document Released: 01/03/2002 Document Revised: 08/04/2014 Document Reviewed: 05/18/2013 Elsevier Interactive Patient Education [employer]2016 Elsevier Inc.

## 2015-12-05 ENCOUNTER — Telehealth: Payer: Self-pay | Admitting: *Deleted

## 2015-12-05 MED ORDER — POTASSIUM CHLORIDE CRYS ER 10 MEQ PO TBCR
10.0000 meq | EXTENDED_RELEASE_TABLET | Freq: Every day | ORAL | Status: DC
Start: 2015-12-05 — End: 2016-11-12

## 2015-12-05 NOTE — Telephone Encounter (Signed)
Pt aware of her lab results.  She will decrease Potassium to 10 meq daily.  New rx sent into her pharmacy. Pt verbalized understanding.

## 2015-12-18 ENCOUNTER — Other Ambulatory Visit: Payer: Self-pay | Admitting: *Deleted

## 2015-12-18 MED ORDER — PANTOPRAZOLE SODIUM 40 MG PO TBEC: 40.0000 mg | DELAYED_RELEASE_TABLET | Freq: Every day | ORAL | Status: DC

## 2015-12-18 MED ORDER — FUROSEMIDE 40 MG PO TABS: 40.0000 mg | ORAL_TABLET | Freq: Every day | ORAL | Status: DC

## 2015-12-18 MED ORDER — APIXABAN 5 MG PO TABS: 5.0000 mg | ORAL_TABLET | Freq: Two times a day (BID) | ORAL | Status: DC

## 2015-12-18 NOTE — Telephone Encounter (Signed)
Ok to refill. Dr.Smith did the cardiology consult during pt's most recent hospitalization.

## 2015-12-18 NOTE — Telephone Encounter (Signed)
Patient requesting refills. She has not yet seen Dr Katrinka BlazingSmith. Ok to refill? Please advise. Thanks, MI

## 2015-12-18 NOTE — Telephone Encounter (Signed)
k

## 2016-02-05 ENCOUNTER — Encounter (HOSPITAL_COMMUNITY): Payer: Self-pay | Admitting: Emergency Medicine

## 2016-02-05 ENCOUNTER — Emergency Department (HOSPITAL_COMMUNITY)
Admission: EM | Admit: 2016-02-05 | Discharge: 2016-02-06 | Disposition: A | Discharge status: Home / Self Care | Payer: BC Managed Care – PPO | Attending: Emergency Medicine | Admitting: Emergency Medicine

## 2016-02-05 DIAGNOSIS — I10 Essential (primary) hypertension: Secondary | ICD-10-CM | POA: Diagnosis not present

## 2016-02-05 DIAGNOSIS — E669 Obesity, unspecified: Secondary | ICD-10-CM | POA: Diagnosis not present

## 2016-02-05 DIAGNOSIS — R42 Dizziness and giddiness: Secondary | ICD-10-CM | POA: Diagnosis not present

## 2016-02-05 DIAGNOSIS — Z7901 Long term (current) use of anticoagulants: Secondary | ICD-10-CM | POA: Diagnosis not present

## 2016-02-05 DIAGNOSIS — R600 Localized edema: Secondary | ICD-10-CM | POA: Insufficient documentation

## 2016-02-05 DIAGNOSIS — Z79899 Other long term (current) drug therapy: Secondary | ICD-10-CM | POA: Insufficient documentation

## 2016-02-05 DIAGNOSIS — Z6841 Body Mass Index (BMI) 40.0 and over, adult: Secondary | ICD-10-CM | POA: Diagnosis not present

## 2016-02-05 DIAGNOSIS — Z87891 Personal history of nicotine dependence: Secondary | ICD-10-CM | POA: Diagnosis not present

## 2016-02-05 LAB — CBC
HEMATOCRIT: 46.1 % — AB (ref 36.0–46.0)
HEMOGLOBIN: 14.6 g/dL (ref 12.0–15.0)
MCH: 30.4 pg (ref 26.0–34.0)
MCHC: 31.7 g/dL (ref 30.0–36.0)
MCV: 96 fL (ref 78.0–100.0)
PLATELETS: 211 10*3/uL (ref 150–400)
RBC: 4.8 MIL/uL (ref 3.87–5.11)
RDW: 14.5 % (ref 11.5–15.5)
WBC: 7 10*3/uL (ref 4.0–10.5)

## 2016-02-05 LAB — BASIC METABOLIC PANEL
Anion gap: 7 (ref 5–15)
BUN: 9 mg/dL (ref 6–20)
CHLORIDE: 106 mmol/L (ref 101–111)
CO2: 25 mmol/L (ref 22–32)
Calcium: 9.2 mg/dL (ref 8.9–10.3)
Creatinine, Ser: 0.81 mg/dL (ref 0.44–1.00)
GFR calc non Af Amer: >60 mL/min (ref >60)
Glucose, Bld: 108 mg/dL — ABNORMAL HIGH (ref 65–99)
POTASSIUM: 3.8 mmol/L (ref 3.5–5.1)
SODIUM: 138 mmol/L (ref 135–145)

## 2016-02-05 NOTE — ED Provider Notes (Signed)
CSN: [id number]     Arrival date & time 02/05/16  1846 History   By signing my name below, I, Terrance Branch, attest that this documentation has been prepared under the direction and in the presence of Shon Batonourtney F Irmalee Riemenschneider, MD. Electronically Signed: Evon Slackerrance Branch, ED Scribe. 02/06/2016. 12:06 AM.    Chief Complaint  Patient presents with  . Dizziness   The history is provided by the patient. No language interpreter was used.   HPI Comments: Christine Hicks is a 60 y.o. female who presents to the Emergency Department complaining of worsening intermittent dizziness onset 3 days prior. She describes the dizziness as the room spinning. Associated congestion and blurred vision when changing positions. Pt also reports some posterior neck pain. She state that the dizziness is worse with position changes. She states she has tried mucinex with no relief. Denies CP, SOB, fever, weakness, ear pain or cough.   Pt reports hx of vertigo.    Past Medical History  Diagnosis Date  . Hypertension   . Thyroid disease   . Anemia, iron deficiency     negative egd/colonoscopy 11/16/2015  . Bicuspid aortic valve 11/11/2015   Past Surgical History  Procedure Laterality Date  . 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 Hernia repair    . Right leg cellulitis surgery    . Colonoscopy N/A 11/16/2015    Procedure: COLONOSCOPY;  Surgeon: Iva Booparl E Gessner, MD;  [facility];  Service: Endoscopy;  Laterality: N/A;  . Esophagogastroduodenoscopy N/A 11/16/2015    Procedure: ESOPHAGOGASTRODUODENOSCOPY (EGD);  Surgeon: Iva Booparl E Gessner, MD;  [facility];  Service: Endoscopy;  Laterality: N/A;   Family History  Problem Relation Age of Onset  . Cardiomyopathy Son   . Heart attack Mother   . Emphysema Father   . Lung cancer Maternal Aunt    Social History  Substance Use Topics  . Smoking status: Former Games developermoker  . Smokeless tobacco: None  . Alcohol Use: No   OB  History    No data available      Review of Systems  Constitutional: Negative for fever.  HENT: Positive for congestion. Negative for ear pain.   Respiratory: Negative for cough and shortness of breath.   Cardiovascular: Negative for chest pain.  Neurological: Positive for dizziness. Negative for weakness.  All other systems reviewed and are negative.    Allergies  Review of patient's allergies indicates no known allergies.  Home Medications   Prior to Admission medications   Medication Sig Start Date End Date Taking? Authorizing Provider  apixaban (ELIQUIS) 5 MG TABS tablet Take 1 tablet (5 mg total) by mouth 2 (two) times daily. 12/18/15  Yes Lyn RecordsHenry W Smith, MD  benazepril (LOTENSIN) 20 MG tablet Take 0.5 tablets (10 mg total) by mouth daily. 12/04/15  Yes Rhonda G Barrett, PA-C  ferrous sulfate 325 (65 FE) MG tablet Take 1 tablet (325 mg total) by mouth 2 (two) times daily with a meal. 11/18/15  Yes Shanker Levora DredgeM Ghimire, MD  furosemide (LASIX) 40 MG tablet Take 1 tablet (40 mg total) by mouth daily. 12/18/15  Yes Lyn RecordsHenry W Smith, MD  guaiFENesin (MUCINEX) 600 MG 12 hr tablet Take 600 mg by mouth 2 (two) times daily as needed for cough or to loosen phlegm.   Yes Historical Provider, MD  levothyroxine (SYNTHROID, LEVOTHROID) 175 MCG tablet Take 175 mcg by mouth daily before breakfast.   Yes Historical Provider, MD  pantoprazole (PROTONIX)  40 MG tablet Take 1 tablet (40 mg total) by mouth daily. 12/18/15  Yes Lyn Records, MD  potassium chloride SA (K-DUR,KLOR-CON) 10 MEQ tablet Take 1 tablet (10 mEq total) by mouth daily. 12/05/15  Yes Leone Brand, NP  zolpidem (AMBIEN) 5 MG tablet Take 1 tablet (5 mg total) by mouth at bedtime as needed for sleep. 11/18/15  Yes Shanker Levora Dredge, MD  meclizine (ANTIVERT) 25 MG tablet Take 1 tablet (25 mg total) by mouth 2 (two) times daily as needed for dizziness. 02/06/16   Shon Baton, MD   BP 146/65 mmHg  Pulse 76  Temp(Src) 98.3 F (36.8 C)  (Oral)  Resp 24  Ht  (1.651 m)  Wt 263 lb (119.296 kg)  BMI 43.77 kg/m2  SpO2 97%  LMP  (LMP Unknown)   Physical Exam  Constitutional: She is oriented to person, place, and time. She appears well-developed and well-nourished.  Obese  HENT:  Head: Normocephalic and atraumatic.  Eyes: EOM are normal. Pupils are equal, round, and reactive to light.  No nystagmus noted  Neck: Normal range of motion. Neck supple.  Cardiovascular: Normal rate and regular rhythm.   Murmur heard. Pulmonary/Chest: Effort normal and breath sounds normal. No respiratory distress. She has no wheezes.  Abdominal: Soft. Bowel sounds are normal. There is no tenderness. There is no rebound.  Musculoskeletal: She exhibits edema.  3+ bilateral lower extremity edema, pitting  Neurological: She is alert and oriented to person, place, and time.  Cranial nerves II through XII intact, 5 out of 5 strength in all 4 extremities, no dysmetria to finger-nose-finger  Skin: Skin is warm and dry.  Psychiatric: She has a normal mood and affect.  Nursing note and vitals reviewed.   ED Course  Procedures (including critical care time) DIAGNOSTIC STUDIES: Oxygen Saturation is 100% on RA, normal by my interpretation.    COORDINATION OF CARE: 11:56 PM-Discussed treatment plan which includes CBC panel and CMPwith pt at bedside and pt agreed to plan.     Labs Review Labs Reviewed  BASIC METABOLIC PANEL - Abnormal; Notable for the following:    Glucose, Bld 108 (*)    All other components within normal limits  CBC - Abnormal; Notable for the following:    HCT 46.1 (*)    All other components within normal limits  CBG MONITORING, ED    Imaging Review No results found. I have personally reviewed and evaluated these images as part of my medical decision-making.   EKG Interpretation   Date/Time:  Wednesday February 06 2016 00:52:48 EDT Ventricular Rate:  73 PR Interval:    QRS Duration: 151 QT Interval:  453 QTC  Calculation: 500 R Axis:   85 Text Interpretation:  Sinus rhythm Consider left atrial enlargement Right  bundle branch block No significant change since last tracing Confirmed by  Murielle Stang  MD, Toni Amend ([id number]) on 02/06/2016 1:10:39 AM      MDM   Final diagnoses:  Vertigo   Agent presents with dizziness. Associated with position changes. Nontoxic on exam. Vital signs reassuring. She is nonfocal. No evidence of cerebellar dysfunction. She is on Eliquis would be appropriate stroke prophylaxis.  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 Orthostatics negative. EKG negative. Basic labwork reassuring.  Patient reports improvement of symptoms with meclizine. She is able to ambulate with a cane at her baseline without difficulty. Will discharge home.  After history, exam, and medical workup I feel the patient has been appropriately medically screened and is safe for discharge home. Pertinent diagnoses were discussed with the patient. Patient was given return precautions.  I personally performed the services described in this documentation, which was scribed in my presence. The recorded information has been reviewed and is accurate.      Shon Baton, MD 02/06/16 [phone]

## 2016-02-05 NOTE — ED Notes (Signed)
Pt states for the last 3 days when she lays down or sits up she has a dizziness feeling that has been worse today. Pt also reports a pain in the back of her neck. Pt denies any numbness or weakness. Pt denies any headache.

## 2016-02-06 MED ORDER — MECLIZINE HCL 25 MG PO TABS: 25.0000 mg | ORAL_TABLET | Freq: Two times a day (BID) | ORAL | Status: DC | PRN

## 2016-02-06 MED ORDER — HYDROCODONE-ACETAMINOPHEN 5-325 MG PO TABS
1.0000 | ORAL_TABLET | Freq: Once | ORAL | Status: AC
  Administered 2016-02-06: 1 via ORAL
  Filled 2016-02-06: qty 1

## 2016-02-06 MED ORDER — MECLIZINE HCL 25 MG PO TABS
25.0000 mg | ORAL_TABLET | Freq: Once | ORAL | Status: AC
  Administered 2016-02-06: 25 mg via ORAL
  Filled 2016-02-06: qty 1

## 2016-02-06 NOTE — ED Notes (Signed)
Pt stable, ambulatory, states understanding of discharge instructions 

## 2016-02-06 NOTE — Discharge Instructions (Signed)
You were seen today for dizziness. This is likely related to your known vertigo. Your workup is otherwise reassuring.  Dizziness Dizziness is a common problem. It is a feeling of unsteadiness or light-headedness. You may feel like you are about to faint. Dizziness can lead to injury if you stumble or fall. Anyone can become dizzy, but dizziness is more common in older adults. This condition can be caused by a number of things, including medicines, dehydration, or illness. HOME CARE INSTRUCTIONS Taking these steps may help with your condition: Eating and Drinking  Drink enough fluid to keep your urine clear or pale yellow. This helps to keep you from becoming dehydrated. Try to drink more clear fluids, such as water.  Do not drink alcohol.  Limit your caffeine intake if directed by your health care provider.  Limit your salt intake if directed by your health care provider. Activity  Avoid making quick movements.  Rise slowly from chairs and steady yourself until you feel okay.  In the morning, first sit up on the side of the bed. When you feel okay, stand slowly while you hold onto something until you know that your balance is fine.  Move your legs often if you need to stand in one place for a long time. Tighten and relax your muscles in your legs while you are standing.  Do not drive or operate heavy machinery if you feel dizzy.  Avoid bending down if you feel dizzy. Place items in your home so that they are easy for you to reach without leaning over. Lifestyle  Do not use any tobacco products, including cigarettes, chewing tobacco, or electronic cigarettes. If you need help quitting, ask your health care provider.  Try to reduce your stress level, such as with yoga or meditation. Talk with your health care provider if you need help. General Instructions  Watch your dizziness for any changes.  Take medicines only as directed by your health care provider. Talk with your health  care provider if you think that your dizziness is caused by a medicine that you are taking.  Tell a friend or a family member that you are feeling dizzy. If he or she notices any changes in your behavior, have this person call your health care provider.  Keep all follow-up visits as directed by your health care provider. This is important. SEEK MEDICAL CARE IF:  Your dizziness does not go away.  Your dizziness or light-headedness gets worse.  You feel nauseous.  You have reduced hearing.  You have new symptoms.  You are unsteady on your feet or you feel like the room is spinning. SEEK IMMEDIATE MEDICAL CARE IF:  You vomit or have diarrhea and are unable to eat or drink anything.  You have problems talking, walking, swallowing, or using your arms, hands, or legs.  You feel generally weak.  You are not thinking clearly or you have trouble forming sentences. It may take a friend or family member to notice this.  You have chest pain, abdominal pain, shortness of breath, or sweating.  Your vision changes.  You notice any bleeding.  You have a headache.  You have neck pain or a stiff neck.  You have a fever.   This information is not intended to replace advice given to you by your health care provider. Make sure you discuss any questions you have with your health care provider.   Document Released: 01/07/2001 Document Revised: 11/28/2014 Document Reviewed: 07/10/2014 Elsevier Interactive Patient Education 2016 Elsevier  Inc. ° °

## 2016-02-06 NOTE — ED Notes (Signed)
Pt ambulated in hall without difficulty.

## 2016-02-19 ENCOUNTER — Encounter: Payer: Self-pay | Admitting: Interventional Cardiology

## 2016-03-10 ENCOUNTER — Ambulatory Visit: Payer: BC Managed Care – PPO | Admitting: Interventional Cardiology

## 2016-06-12 ENCOUNTER — Ambulatory Visit (INDEPENDENT_AMBULATORY_CARE_PROVIDER_SITE_OTHER): Payer: BC Managed Care – PPO | Admitting: Interventional Cardiology

## 2016-06-12 ENCOUNTER — Encounter: Payer: Self-pay | Admitting: Interventional Cardiology

## 2016-06-12 VITALS — BP 144/98 | HR 94 | Ht 65.0 in | Wt 286.4 lb

## 2016-06-12 DIAGNOSIS — R0602 Shortness of breath: Secondary | ICD-10-CM

## 2016-06-12 DIAGNOSIS — Q2381 Bicuspid aortic valve: Secondary | ICD-10-CM

## 2016-06-12 DIAGNOSIS — I350 Nonrheumatic aortic (valve) stenosis: Secondary | ICD-10-CM

## 2016-06-12 DIAGNOSIS — Q231 Congenital insufficiency of aortic valve: Secondary | ICD-10-CM | POA: Diagnosis not present

## 2016-06-12 DIAGNOSIS — I82409 Acute embolism and thrombosis of unspecified deep veins of unspecified lower extremity: Secondary | ICD-10-CM

## 2016-06-12 DIAGNOSIS — O2230 Deep phlebothrombosis in pregnancy, unspecified trimester: Secondary | ICD-10-CM

## 2016-06-12 DIAGNOSIS — R9431 Abnormal electrocardiogram [ECG] [EKG]: Secondary | ICD-10-CM | POA: Diagnosis not present

## 2016-06-12 DIAGNOSIS — I10 Essential (primary) hypertension: Secondary | ICD-10-CM

## 2016-06-12 MED ORDER — APIXABAN 5 MG PO TABS: 5.0000 mg | ORAL_TABLET | Freq: Two times a day (BID) | ORAL | 3 refills | Status: DC

## 2016-06-12 MED ORDER — BENAZEPRIL HCL 20 MG PO TABS: 20.0000 mg | ORAL_TABLET | Freq: Every day | ORAL | 3 refills | Status: DC

## 2016-06-12 NOTE — Progress Notes (Signed)
Cardiology Office Note    Date:  06/12/2016   ID:  Christine Hicks, DOB Nov 28, 1955, MRN [id number]  PCP:  Billee Cashing, MD  Cardiologist: Lesleigh Noe, MD   Chief Complaint  Patient presents with  . Aortic Stenosis    History of Present Illness:  Christine Hicks is a 60 y.o. female female with a history of HTN, HLD, bicuspid AoV, mod AS  The patient has a known history of bicuspid aortic valve. During a relatively recent hospital stay for gastrointestinal problems, a repeat echo demonstrated critical aortic stenosis. She had symptomatic anemia. Since the anemia has improved, she feels much better. She does have exertional fatigue. She just stops and rests and feels better quickly. She denies orthopnea, PND, syncope, and angina.   Past Medical History:  Diagnosis Date  . Anemia, iron deficiency    negative egd/colonoscopy 11/16/2015  . Bicuspid aortic valve 11/11/2015  . Hypertension   . Thyroid disease     Past Surgical History:  Procedure Laterality Date  . 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 COLONOSCOPY N/A 11/16/2015   Procedure: COLONOSCOPY;  Surgeon: Iva Boop, MD;  Location: [facility] ENDOSCOPY;  Service: Endoscopy;  Laterality: N/A;  . ESOPHAGOGASTRODUODENOSCOPY N/A 11/16/2015   Procedure: ESOPHAGOGASTRODUODENOSCOPY (EGD);  Surgeon: Iva Boop, MD;  Location: [facility] ENDOSCOPY;  Service: Endoscopy;  Laterality: N/A;  . HERNIA REPAIR    . Right leg cellulitis surgery      Current Medications: Outpatient Medications Prior to Visit  Medication Sig Dispense Refill  . ferrous sulfate 325 (65 FE) MG tablet Take 1 tablet (325 mg total) by mouth 2 (two) times daily with a meal. 60 tablet 0  . furosemide (LASIX) 40 MG tablet Take 1 tablet (40 mg total) by mouth daily. 30 tablet 11  . guaiFENesin (MUCINEX) 600 MG 12 hr tablet Take 600 mg by mouth 2 (two) times daily as needed for cough or to loosen phlegm.    Marland Kitchen levothyroxine  (SYNTHROID, LEVOTHROID) 175 MCG tablet Take 175 mcg by mouth daily before breakfast.    . meclizine (ANTIVERT) 25 MG tablet Take 1 tablet (25 mg total) by mouth 2 (two) times daily as needed for dizziness. 30 tablet 0  . pantoprazole (PROTONIX) 40 MG tablet Take 1 tablet (40 mg total) by mouth daily. 30 tablet 11  . potassium chloride SA (K-DUR,KLOR-CON) 10 MEQ tablet Take 1 tablet (10 mEq total) by mouth daily. 90 tablet 3  . zolpidem (AMBIEN) 5 MG tablet Take 1 tablet (5 mg total) by mouth at bedtime as needed for sleep. 10 tablet 0  . apixaban (ELIQUIS) 5 MG TABS tablet Take 1 tablet (5 mg total) by mouth 2 (two) times daily. 60 tablet 11  . benazepril (LOTENSIN) 20 MG tablet Take 0.5 tablets (10 mg total) by mouth daily. 30 tablet 5   No facility-administered medications prior to visit.      Allergies:   Patient has no known allergies.   Social History   Social History  . Marital status: Married    Spouse name: N/A  . Number of children: N/A  . Years of education: N/A   Social History Main Topics  . Smoking status: Former Games developer  . Smokeless tobacco: Never Used  . Alcohol use No  . Drug use: Unknown  . Sexual activity: Yes   Other Topics Concern  . None   Social History Narrative  . None  Family History:  The patient's family history includes Cardiomyopathy in her son; Emphysema in her father; Heart attack in her mother; Lung cancer in her maternal aunt.   ROS:   Please see the history of present illness.    Delta work needs to be done. Back pain and muscle stiffness. Cough, headache, and joint swelling.  All other systems reviewed and are negative.   PHYSICAL EXAM:   VS:  BP (!) 144/98   Pulse 94   Ht 5\' 5"  (1.651 m)   Wt 286 lb 6.4 oz (129.9 kg)   LMP  (LMP Unknown)   BMI 47.66 kg/m    GEN: Well nourished, well developed, in no acute distress  HEENT: normal  Neck: no JVD, carotid bruits, or masses Cardiac: RRR; 4/6 crescendo decrescendo systolic murmur  of aortic stenosis. An S4 gallop is audible., rubs, or gallops,no edema  Respiratory:  clear to auscultation bilaterally, normal work of breathing GI: soft, nontender, nondistended, + BS MS: no deformity or atrophy  Skin: warm and dry, no rash Neuro:  Alert and Oriented x 3, Strength and sensation are intact Psych: euthymic mood, full affect  Wt Readings from Last 3 Encounters:  06/12/16 286 lb 6.4 oz (129.9 kg)  02/05/16 263 lb (119.3 kg)  12/04/15 279 lb (126.6 kg)      Studies/Labs Reviewed:   EKG:  EKG  Not performed on today's visit. In July she had normal sinus rhythm with right bundle branch block.  Recent Labs: 11/10/2015: B Natriuretic Peptide 291.3 11/11/2015: TSH 0.180 11/13/2015: ALT 19 02/05/2016: BUN 9; Creatinine, Ser 0.81; Hemoglobin 14.6; Platelets 211; Potassium 3.8; Sodium 138   Lipid Panel    Component Value Date/Time   CHOL  09/11/2009 0555    124        ATP III CLASSIFICATION:  <200     mg/dL   Desirable  604-540  mg/dL   Borderline High  >=981    mg/dL   High          TRIG 191 (H) 09/11/2009 0555   HDL 14 (L) 09/11/2009 0555   CHOLHDL 8.9 09/11/2009 0555   VLDL 40 09/11/2009 0555   LDLCALC  09/11/2009 0555    70        Total Cholesterol/HDL:CHD Risk Coronary Heart Disease Risk Table                     Men   Women  1/2 Average Risk   3.4   3.3  Average Risk       5.0   4.4  2 X Average Risk   9.6   7.1  3 X Average Risk  23.4   11.0        Use the calculated Patient Ratio above and the CHD Risk Table to determine the patient's CHD Risk.        ATP III CLASSIFICATION (LDL):  <100     mg/dL   Optimal  478-295  mg/dL   Near or Above                    Optimal  130-159  mg/dL   Borderline  621-308  mg/dL   High  >657     mg/dL   Very High    Additional studies/ records that were reviewed today include:  11/14/2015, ECHOCARDIOGRAM: ------------------------------------------------------------------- Study Conclusions  - Left ventricle:  The cavity size was normal. There was severe   focal basal and moderate  concentric hypertrophy. Systolic   function was normal. The estimated ejection fraction was in the   range of 55% to 60%. Wall motion was normal; there were no   regional wall motion abnormalities. There was an increased   relative contribution of atrial contraction to ventricular   filling. Doppler parameters are consistent with abnormal left   ventricular relaxation (grade 1 diastolic dysfunction). Doppler   parameters are consistent with high ventricular filling pressure. - Aortic valve: Severe diffuse thickening and calcification. Valve   mobility was restricted. There was severe stenosis. Valve area   (VTI): 0.64 cm^2. Valve area (Vmax): 0.43 cm^2. Valve area   (Vmean): 0.45 cm^2. - Mitral valve: Calcified annulus. There was mild regurgitation. - Pulmonary arteries: PA peak pressure: 31 mm Hg (S).   ASSESSMENT:    1. Aortic stenosis, severe   2. Bicuspid aortic valve   3. Essential hypertension   4. Prolonged QT interval   5. Shortness of breath   6. DVT (deep vein thrombosis) in pregnancy [facility](HCC)      PLAN:  In order of problems listed above:  1. This problem is critical. Peak velocity through the aortic valve is 4.5 m/s. She does not believe that she is having difficulties because of this. I explained to her exertional fatigue is highly likely to be secondary to critical aortic stenosis. The substrate is a bicuspid aortic valve. We discussed treatment options including TAVR versus SAVR. She has not ready to consider pre-treatment workup. I encouraged her to give this serious consideration and be prepared to have heart cath and referral to heart valve clinic sometime after our next visit in 6 months. She is cautioned to call if any cardinal symptoms of symptomatic aortic stenosis developed. 2. Same . 3. Low-salt diet. Aerobic activity as tolerated. Weight loss will also help this problem.  4. No longer  present. This problem will be taken off the list  5. Felt to be multifactorial related to critical aortic stenosis, obesity, and deconditioning. I did encourage the patient to continue to attempt walking but being careful to stop and rest once she develops moderate symptoms.  6. Eliquis is being used for DVT and not atrial fibrillation or other cardiac issues. This medication will eventually be discontinued. I assume that this is under the management per review of her primary care. This is her second episode of DVT. Perhaps anticoagulation should be continued indefinitely.    Medication Adjustments/Labs and Tests Ordered: Current medicines are reviewed at length with the patient today.  Concerns regarding medicines are outlined above.  Medication changes, Labs and Tests ordered today are listed in the Patient Instructions below. Patient Instructions  Medication Instructions:  1) INCREASE Benazapril to 20mg  once daily  Labwork: None  Testing/Procedures: Your physician has requested that you have an echocardiogram in five months. Echocardiography is a painless test that uses sound waves to create images of your heart. It provides your doctor with information about the size and shape of your heart and how well your heart's chambers and valves are working. This procedure takes approximately one hour. There are no restrictions for this procedure.    Follow-Up: Your physician wants you to follow-up in: 6 months with Dr. Katrinka BlazingSmith. You will receive a reminder letter in the mail two months in advance. If you don't receive a letter, please call our office to schedule the follow-up appointment.   Any Other Special Instructions Will Be Listed Below (If Applicable).     If you need a  refill on your cardiac medications before your next appointment, please call your pharmacy.      Signed, Lesleigh NoeHenry W Smith III, MD  06/12/2016 12:44 PM    [facility] [address],  [place], [place]  [address] Phone: [phone]; Fax: [phone]

## 2016-06-12 NOTE — Patient Instructions (Signed)
Medication Instructions:  1) INCREASE Benazapril to 20mg  once daily  Labwork: None  Testing/Procedures: Your physician has requested that you have an echocardiogram in five months. Echocardiography is a painless test that uses sound waves to create images of your heart. It provides your doctor with information about the size and shape of your heart and how well your heart's chambers and valves are working. This procedure takes approximately one hour. There are no restrictions for this procedure.    Follow-Up: Your physician wants you to follow-up in: 6 months with Dr. Katrinka BlazingSmith. You will receive a reminder letter in the mail two months in advance. If you don't receive a letter, please call our office to schedule the follow-up appointment.   Any Other Special Instructions Will Be Listed Below (If Applicable).     If you need a refill on your cardiac medications before your next appointment, please call your pharmacy.

## 2016-11-06 ENCOUNTER — Telehealth: Payer: Self-pay | Admitting: Interventional Cardiology

## 2016-11-06 NOTE — Telephone Encounter (Signed)
Called Christine Hicks back and provided the procedure code for echo (93306).  She thanked me for the call.

## 2016-11-06 NOTE — Telephone Encounter (Signed)
Nwe Message  Pt voiced she is needing the medical code for [employer] to pay for her Echo and she has cancelled her appt until then. There's an authorization number on file expires 5.10 in appt notes.  Pt voiced wants to reschedule after 4.24 but want to leave it cancelled until she hears back from nurse.  Please f/u

## 2016-11-06 NOTE — Telephone Encounter (Signed)
Pt is calling in requesting to speak with our billing/pre-cert dept,  to obtain needed medical codes for her upcoming echo Dr Katrinka Blazing ordered at the last OV. Pt states that [employer] is requiring these codes.  Pt states she will hold off on scheduling her echo, until she can obtain these codes to endorse to [facility], for coverage.  Informed the pt that I will route this request to our billing and pre-cert dept for further review and follow-up with the pt.  Pt verbalized understanding and agrees with this plan.

## 2016-11-06 NOTE — Telephone Encounter (Signed)
Informed patient that yearly ECHO is needed to assess aortic stenosis. Reminded her that last ECHO was in April. She was grateful for explanation and agrees to schedule test in [place].  Message sent to [facility] to schedule.

## 2016-11-06 NOTE — Telephone Encounter (Signed)
New Message  Spoke with Christine Hicks and she voiced to help patient save on cost to schedule Echo in [place] for pt.  Pt voiced she wanted [facility] but iterated to pt to help save on cost it's cheaper at [place] location.  Pt voiced wanting to know the necessity of test due to her having one within a year.  Please f/u with pt

## 2016-11-06 NOTE — Telephone Encounter (Signed)
New Message  Pt voiced she would like to know why she has to take another Echo when it has not been a year from your previous Echo.  Spoke with Bjorn Loser and she voiced to help patient save on cost to schedule Echo in [place] for pt.  Please f/u with pt

## 2016-11-10 ENCOUNTER — Other Ambulatory Visit (HOSPITAL_COMMUNITY): Payer: BC Managed Care – PPO

## 2016-11-11 ENCOUNTER — Other Ambulatory Visit: Payer: Self-pay | Admitting: Interventional Cardiology

## 2016-11-11 NOTE — Telephone Encounter (Signed)
Medication Detail    Disp Refills Start End   furosemide (LASIX) 40 MG tablet 30 tablet 11 12/18/2015    Sig - Route: Take 1 tablet (40 mg total) by mouth daily. - Oral   E-Prescribing Status: Receipt confirmed by pharmacy (12/18/2015 4:01 PM EDT)   Pharmacy   [id number] - [place], [place] - [address]

## 2016-11-12 ENCOUNTER — Other Ambulatory Visit: Payer: Self-pay | Admitting: Cardiology

## 2016-11-20 ENCOUNTER — Other Ambulatory Visit: Payer: Self-pay

## 2016-11-20 ENCOUNTER — Ambulatory Visit (INDEPENDENT_AMBULATORY_CARE_PROVIDER_SITE_OTHER): Payer: BC Managed Care – PPO

## 2016-11-20 DIAGNOSIS — I350 Nonrheumatic aortic (valve) stenosis: Secondary | ICD-10-CM | POA: Diagnosis not present

## 2016-11-20 DIAGNOSIS — Q231 Congenital insufficiency of aortic valve: Secondary | ICD-10-CM | POA: Diagnosis not present

## 2016-12-04 ENCOUNTER — Other Ambulatory Visit: Payer: Self-pay | Admitting: Interventional Cardiology

## 2016-12-10 NOTE — Progress Notes (Addendum)
Cardiology ADMISSION   Date:  12/11/2016   ID:  Christine Hicks, DOB 12/25/1955, MRN [id number]  PCP:  Billee Cashing, MD  Cardiologist: Lesleigh Noe, MD   Chief Complaint  Patient presents with  . Cardiac Valve Problem    AS    History of Present Illness:  Christine Hicks is a 61 y.o. female with history of bicuspid aortic valve, progressive aortic stenosis, and hypertension.  Obese 61 year old female with congenitally bicuspid aortic valve and a several year history of critical aortic stenosis for which she has refused therapy. Recently she has begun experiencing orthopnea and palpitations with recumbency. Recent echocardiogram demonstrated progression of aortic stenosis now with a peak velocity 5.97 m/s, peak gradient 143 mmHg (compared with 4.8 m/s and 92 mmHg one year ago). She denies chest pain. She has not had syncope. She is having PND and palpitations that cause her to set up at night.  She refused workup last year. She skipped a six-month follow-up but we had scheduled 1 year ago. Developing symptoms caused her to reestablish contact with [place] and recent echo demonstrated significant progression.  Past Medical History:  Diagnosis Date  . Anemia, iron deficiency    negative egd/colonoscopy 11/16/2015  . Bicuspid aortic valve 11/11/2015  . Hypertension   . Thyroid disease     Past Surgical History:  Procedure Laterality Date  . 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 COLONOSCOPY N/A 11/16/2015   Procedure: COLONOSCOPY;  Surgeon: Iva Boop, MD;  Location: [facility] ENDOSCOPY;  Service: Endoscopy;  Laterality: N/A;  . ESOPHAGOGASTRODUODENOSCOPY N/A 11/16/2015   Procedure: ESOPHAGOGASTRODUODENOSCOPY (EGD);  Surgeon: Iva Boop, MD;  Location: [facility] ENDOSCOPY;  Service: Endoscopy;  Laterality: N/A;  . HERNIA REPAIR    . Right leg cellulitis surgery      Current Medications: Outpatient Medications Prior to Visit  Medication Sig Dispense  Refill  . apixaban (ELIQUIS) 5 MG TABS tablet Take 1 tablet (5 mg total) by mouth 2 (two) times daily. 180 tablet 3  . benazepril (LOTENSIN) 20 MG tablet Take 1 tablet (20 mg total) by mouth daily. 90 tablet 3  . ferrous sulfate 325 (65 FE) MG tablet Take 1 tablet (325 mg total) by mouth 2 (two) times daily with a meal. 60 tablet 0  . guaiFENesin (MUCINEX) 600 MG 12 hr tablet Take 600 mg by mouth 2 (two) times daily as needed for cough or to loosen phlegm.    Marland Kitchen levothyroxine (SYNTHROID, LEVOTHROID) 175 MCG tablet Take 175 mcg by mouth daily before breakfast.    . pantoprazole (PROTONIX) 40 MG tablet TAKE 1 TABLET (40 MG TOTAL) BY MOUTH DAILY. 30 tablet 6  . furosemide (LASIX) 40 MG tablet Take 1 tablet (40 mg total) by mouth daily. 30 tablet 11  . KLOR-CON M10 10 MEQ tablet TAKE 1 TABLET (10 MEQ TOTAL) BY MOUTH DAILY. 90 tablet 3  . meclizine (ANTIVERT) 25 MG tablet Take 1 tablet (25 mg total) by mouth 2 (two) times daily as needed for dizziness. (Patient not taking: Reported on 12/11/2016) 30 tablet 0  . traMADol (ULTRAM) 50 MG tablet Take 50 mg to 100 mg by mouth three (3) times daily as needed for pain.    Marland Kitchen zolpidem (AMBIEN) 5 MG tablet Take 1 tablet (5 mg total) by mouth at bedtime as needed for sleep. (Patient not taking: Reported on 12/11/2016) 10 tablet 0   No facility-administered medications prior to visit.  Allergies:   Patient has no known allergies.   Social History   Social History  . Marital status: Married    Spouse name: N/A  . Number of children: N/A  . Years of education: N/A   Social History Main Topics  . Smoking status: Former Games developer  . Smokeless tobacco: Never Used  . Alcohol use No  . Drug use: Unknown  . Sexual activity: Yes   Other Topics Concern  . None   Social History Narrative  . None     Family History:  The patient's family history includes Cardiomyopathy in her son; Emphysema in her father; Heart attack in her mother; Lung cancer in her  maternal aunt.   ROS:   Please see the history of present illness.    Difficulty sleeping. Muscle pain and back pain. Lower extremity swelling. Leg pain, irregular heartbeat, excessive fatigue, joint swelling, and difficulty with bowel  All other systems reviewed and are negative.   PHYSICAL EXAM:   VS:  BP (!) 128/104   Pulse 93   Ht 5\' 5"  (1.651 m)   Wt 288 lb (130.6 kg)   LMP  (LMP Unknown)   BMI 47.93 kg/m    GEN: Well nourished, well developed, in no acute distress. Morbid obesity.  HEENT: normal  Neck: no JVD, carotid bruits, or masses Cardiac: RRR,  rubs, but with an S4 gallop. 2+ bilateral ankle edema.  Respiratory:  clear to auscultation bilaterally, normal work of breathing GI: soft, nontender, nondistended, + BS MS: no deformity or atrophy  Skin: warm and dry, no rash Neuro:  Alert and Oriented x 3, Strength and sensation are intact Psych: euthymic mood, full affect  Wt Readings from Last 3 Encounters:  12/11/16 288 lb (130.6 kg)  06/12/16 286 lb 6.4 oz (129.9 kg)  02/05/16 263 lb (119.3 kg)      Studies/Labs Reviewed:   EKG:  EKG  Right bundle, sinus rhythm, left atrial abnormality.  Recent Labs: 02/05/2016: BUN 9; Creatinine, Ser 0.81; Hemoglobin 14.6; Platelets 211; Potassium 3.8; Sodium 138   Lipid Panel    Component Value Date/Time   CHOL  09/11/2009 0555    124        ATP III CLASSIFICATION:  <200     mg/dL   Desirable  191-478  mg/dL   Borderline High  >=295    mg/dL   High          TRIG 621 (H) 09/11/2009 0555   HDL 14 (L) 09/11/2009 0555   CHOLHDL 8.9 09/11/2009 0555   VLDL 40 09/11/2009 0555   LDLCALC  09/11/2009 0555    70        Total Cholesterol/HDL:CHD Risk Coronary Heart Disease Risk Table                     Men   Women  1/2 Average Risk   3.4   3.3  Average Risk       5.0   4.4  2 X Average Risk   9.6   7.1  3 X Average Risk  23.4   11.0        Use the calculated Patient Ratio above and the CHD Risk Table to determine the  patient's CHD Risk.        ATP III CLASSIFICATION (LDL):  <100     mg/dL   Optimal  308-657  mg/dL   Near or Above  Optimal  130-159  mg/dL   Borderline  161-096  mg/dL   High  >045     mg/dL   Very High    Additional studies/ records that were reviewed today include:   Echocardiography from April 2018:  Study Conclusions  - Left ventricle: The cavity size was normal. Wall thickness was   increased in a pattern of moderate LVH. Systolic function was   normal. The estimated ejection fraction was in the range of 55%   to 60%. Wall motion was normal; there were no regional wall   motion abnormalities. Doppler parameters are consistent with   abnormal left ventricular relaxation (grade 1 diastolic   dysfunction). - Aortic valve: There was severe stenosis. Mean gradient (S): 82 mm; Peak gradient 148 mmHg   Hg. Valve area (VTI): 0.42 cm^2. Valve area (Vmean): 0.43 cm^2. - Left atrium: The atrium was mildly dilated. - Right atrium: The atrium was mildly dilated. - Tricuspid valve: There was moderate regurgitation. - Pulmonary arteries: Systolic pressure was mildly to moderately   increased. PA peak pressure: 45 mm Hg (S).  Impressions:  - Aortic stenosis parameters have progressed compared to previous   echo 10/2015.   ASSESSMENT:    1. Aortic stenosis, severe   2. Acute on chronic diastolic heart failure (HCC)   3. Bicuspid aortic valve   4. Acute thromboembolism of deep veins of lower extremity, unspecified laterality (HCC)   5. Essential hypertension   6. Hypothyroidism, unspecified type   7. Palpitations      PLAN:  In order of problems listed above:  1. Progression and now symptomatic critical aortic stenosis in a patient with bicuspid aortic valve with progressive  orthopnea. Plan is to move to right and left heart cath as soon as possible. She is currently volume overloaded and after long discussion with the patient have decided to admit to  the hospital for tuning gentle diuresis prior to heart cath next Tuesday by me or Monday by a colleague if ready clinically. Cardiac catheterization has been described to the patient and family in detail including risk of bleeding, death, stroke, myocardial infarction, among other potential problems. 2. Admitted to the hospital for IV diuresis by monitoring for evidence of hypotension. Initially we were going to attempt this as an outpatient but intuition feels it would be safer to do it in a hospital with close follow-up. Should probably remain hospitalized until cath early next week. 3. Known bicuspid aortic valve now progressed to critical aortic stenosis. 4. History of recurrent DVT and is on chronic long-term oral anticoagulation therapy to prevent recurrence. We'll discontinue eloquent's and convert IV heparin until after cath next week. 5. Not currently hypertensive likely related to progression of critical aortic stenosis. 6. We'll check thyroid function studies 7. Telemetry will help determine if she is having sustained arrhythmias at night or if the palpitations are simply tachycardia related to heart failure.   The patient was counseled to undergo left heart catheterization, coronary angiography, and possible percutaneous coronary intervention with stent implantation. The procedural risks and benefits were discussed in detail. The risks discussed included death, stroke, myocardial infarction, life-threatening bleeding, limb ischemia, kidney injury, allergy, and possible emergency cardiac surgery. The risk of these significant complications were estimated to occur less than 1% of the time. After discussion, the patient has agreed to proceed.  Overall, the patient is seriously ill, has developed progressive critical aortic stenosis now with acute on chronic diastolic heart failure (please see echocardiogram from April  2018.   Medication Adjustments/Labs and Tests Ordered: Current medicines  are reviewed at length with the patient today.  Concerns regarding medicines are outlined above.  Medication changes, Labs and Tests ordered today are listed in the Patient Instructions below. Patient Instructions  Medication Instructions:  None  Labwork: None   Testing/Procedures: Your physician has requested that you have a cardiac catheterization on 12/16/16. Cardiac catheterization is used to diagnose and/or treat various heart conditions. Doctors may recommend this procedure for a number of different reasons. The most common reason is to evaluate chest pain. Chest pain can be a symptom of coronary artery disease (CAD), and cardiac catheterization can show whether plaque is narrowing or blocking your heart's arteries. This procedure is also used to evaluate the valves, as well as measure the blood flow and oxygen levels in different parts of your heart. For further information please visit [email or web address] Please follow instruction sheet, as given.   Follow-Up: Your physician recommends that you schedule a follow-up appointment 2 weeks after heart catheterization with a PA or NP.   Any Other Special Instructions Will Be Listed Below (If Applicable).  You are being admitted to hospital for IV diuresis.    If you need a refill on your cardiac medications before your next appointment, please call your pharmacy.      Signed, Lesleigh NoeHenry W Smith III, MD  12/11/2016 11:40 AM    [facility] [address], [place], [place]  [address] Phone: [phone]; Fax: [phone]) [phone]

## 2016-12-11 ENCOUNTER — Encounter: Payer: Self-pay | Admitting: Interventional Cardiology

## 2016-12-11 ENCOUNTER — Ambulatory Visit (INDEPENDENT_AMBULATORY_CARE_PROVIDER_SITE_OTHER): Payer: BC Managed Care – PPO | Admitting: Interventional Cardiology

## 2016-12-11 ENCOUNTER — Inpatient Hospital Stay (HOSPITAL_COMMUNITY)
Admission: AD | Admit: 2016-12-11 | Discharge: 2016-12-17 | DRG: 286 | Disposition: A | Discharge status: Home / Self Care | Payer: Medicare Other | Source: Ambulatory Visit | Attending: Interventional Cardiology | Admitting: Interventional Cardiology

## 2016-12-11 ENCOUNTER — Encounter: Payer: Self-pay | Admitting: *Deleted

## 2016-12-11 ENCOUNTER — Encounter (HOSPITAL_COMMUNITY): Payer: Self-pay | Admitting: *Deleted

## 2016-12-11 ENCOUNTER — Encounter (INDEPENDENT_AMBULATORY_CARE_PROVIDER_SITE_OTHER): Payer: Self-pay

## 2016-12-11 VITALS — BP 128/104 | HR 93 | Ht 65.0 in | Wt 288.0 lb

## 2016-12-11 DIAGNOSIS — Z6841 Body Mass Index (BMI) 40.0 and over, adult: Secondary | ICD-10-CM

## 2016-12-11 DIAGNOSIS — I82409 Acute embolism and thrombosis of unspecified deep veins of unspecified lower extremity: Secondary | ICD-10-CM | POA: Diagnosis present

## 2016-12-11 DIAGNOSIS — Z8249 Family history of ischemic heart disease and other diseases of the circulatory system: Secondary | ICD-10-CM

## 2016-12-11 DIAGNOSIS — I350 Nonrheumatic aortic (valve) stenosis: Secondary | ICD-10-CM

## 2016-12-11 DIAGNOSIS — E039 Hypothyroidism, unspecified: Secondary | ICD-10-CM | POA: Diagnosis present

## 2016-12-11 DIAGNOSIS — Z87891 Personal history of nicotine dependence: Secondary | ICD-10-CM

## 2016-12-11 DIAGNOSIS — I82503 Chronic embolism and thrombosis of unspecified deep veins of lower extremity, bilateral: Secondary | ICD-10-CM

## 2016-12-11 DIAGNOSIS — I10 Essential (primary) hypertension: Secondary | ICD-10-CM

## 2016-12-11 DIAGNOSIS — R002 Palpitations: Secondary | ICD-10-CM | POA: Diagnosis present

## 2016-12-11 DIAGNOSIS — I5033 Acute on chronic diastolic (congestive) heart failure: Secondary | ICD-10-CM

## 2016-12-11 DIAGNOSIS — E079 Disorder of thyroid, unspecified: Secondary | ICD-10-CM

## 2016-12-11 DIAGNOSIS — R0602 Shortness of breath: Secondary | ICD-10-CM

## 2016-12-11 DIAGNOSIS — Z86718 Personal history of other venous thrombosis and embolism: Secondary | ICD-10-CM | POA: Diagnosis not present

## 2016-12-11 DIAGNOSIS — Q231 Congenital insufficiency of aortic valve: Secondary | ICD-10-CM | POA: Diagnosis not present

## 2016-12-11 DIAGNOSIS — D509 Iron deficiency anemia, unspecified: Secondary | ICD-10-CM

## 2016-12-11 DIAGNOSIS — I509 Heart failure, unspecified: Secondary | ICD-10-CM

## 2016-12-11 DIAGNOSIS — I110 Hypertensive heart disease with heart failure: Secondary | ICD-10-CM | POA: Diagnosis present

## 2016-12-11 DIAGNOSIS — Z7901 Long term (current) use of anticoagulants: Secondary | ICD-10-CM

## 2016-12-11 DIAGNOSIS — R0601 Orthopnea: Secondary | ICD-10-CM | POA: Diagnosis present

## 2016-12-11 HISTORY — DX: Acute on chronic diastolic (congestive) heart failure: I50.33

## 2016-12-11 HISTORY — DX: Iron deficiency anemia, unspecified: D50.9

## 2016-12-11 HISTORY — DX: Disorder of thyroid, unspecified: E07.9

## 2016-12-11 HISTORY — DX: Essential (primary) hypertension: I10

## 2016-12-11 LAB — COMPREHENSIVE METABOLIC PANEL
ALK PHOS: 78 U/L (ref 38–126)
ALT: 26 U/L (ref 14–54)
ANION GAP: 7 (ref 5–15)
AST: 38 U/L (ref 15–41)
Albumin: 3.7 g/dL (ref 3.5–5.0)
BILIRUBIN TOTAL: 1 mg/dL (ref 0.3–1.2)
BUN: 8 mg/dL (ref 6–20)
CALCIUM: 9.4 mg/dL (ref 8.9–10.3)
CO2: 25 mmol/L (ref 22–32)
Chloride: 108 mmol/L (ref 101–111)
Creatinine, Ser: 0.83 mg/dL (ref 0.44–1.00)
GFR calc Af Amer: >60 mL/min (ref >60)
GLUCOSE: 118 mg/dL — AB (ref 65–99)
Potassium: 4.1 mmol/L (ref 3.5–5.1)
Sodium: 140 mmol/L (ref 135–145)
TOTAL PROTEIN: 7.5 g/dL (ref 6.5–8.1)

## 2016-12-11 LAB — CBC WITH DIFFERENTIAL/PLATELET
BASOS ABS: 0 10*3/uL (ref 0.0–0.1)
BASOS PCT: 0 %
Eosinophils Absolute: 0.3 10*3/uL (ref 0.0–0.7)
Eosinophils Relative: 4 %
HEMATOCRIT: 47.7 % — AB (ref 36.0–46.0)
Hemoglobin: 15.3 g/dL — ABNORMAL HIGH (ref 12.0–15.0)
Lymphocytes Relative: 40 %
Lymphs Abs: 2.8 10*3/uL (ref 0.7–4.0)
MCH: 31.7 pg (ref 26.0–34.0)
MCHC: 32.1 g/dL (ref 30.0–36.0)
MCV: 98.8 fL (ref 78.0–100.0)
MONO ABS: 0.4 10*3/uL (ref 0.1–1.0)
Monocytes Relative: 5 %
NEUTROS ABS: 3.5 10*3/uL (ref 1.7–7.7)
Neutrophils Relative %: 51 %
PLATELETS: 168 10*3/uL (ref 150–400)
RBC: 4.83 MIL/uL (ref 3.87–5.11)
RDW: 14 % (ref 11.5–15.5)
WBC: 7 10*3/uL (ref 4.0–10.5)

## 2016-12-11 LAB — BRAIN NATRIURETIC PEPTIDE: B NATRIURETIC PEPTIDE 5: 81.6 pg/mL (ref 0.0–100.0)

## 2016-12-11 LAB — TROPONIN I: Troponin I: <0.03 ng/mL (ref <0.03)

## 2016-12-11 LAB — TSH: TSH: 0.08 u[IU]/mL — AB (ref 0.350–4.500)

## 2016-12-11 LAB — MAGNESIUM: Magnesium: 2.1 mg/dL (ref 1.7–2.4)

## 2016-12-11 MED ORDER — POTASSIUM CHLORIDE CRYS ER 10 MEQ PO TBCR: 10.0000 meq | EXTENDED_RELEASE_TABLET | Freq: Two times a day (BID) | ORAL | 3 refills | Status: DC

## 2016-12-11 MED ORDER — POTASSIUM CHLORIDE CRYS ER 20 MEQ PO TBCR
20.0000 meq | EXTENDED_RELEASE_TABLET | Freq: Every day | ORAL | Status: AC
  Administered 2016-12-11 – 2016-12-12 (×2): 20 meq via ORAL
  Filled 2016-12-11 (×2): qty 1

## 2016-12-11 MED ORDER — ASPIRIN EC 81 MG PO TBEC
81.0000 mg | DELAYED_RELEASE_TABLET | Freq: Every day | ORAL | Status: DC
  Administered 2016-12-11 – 2016-12-15 (×5): 81 mg via ORAL
  Filled 2016-12-11 (×5): qty 1

## 2016-12-11 MED ORDER — SODIUM CHLORIDE 0.9% FLUSH: 3.0000 mL | INTRAVENOUS | Status: DC | PRN

## 2016-12-11 MED ORDER — FUROSEMIDE 10 MG/ML IJ SOLN
40.0000 mg | Freq: Two times a day (BID) | INTRAMUSCULAR | Status: DC
  Administered 2016-12-11 – 2016-12-12 (×2): 40 mg via INTRAVENOUS
  Filled 2016-12-11 (×2): qty 4

## 2016-12-11 MED ORDER — FUROSEMIDE 40 MG PO TABS
60.0000 mg | ORAL_TABLET | Freq: Every day | ORAL | 3 refills | Status: DC
Start: 2016-12-11 — End: 2016-12-11

## 2016-12-11 MED ORDER — SODIUM CHLORIDE 0.9 % IV SOLN: 250.0000 mL | INTRAVENOUS | Status: DC | PRN

## 2016-12-11 MED ORDER — ONDANSETRON HCL 4 MG/2ML IJ SOLN: 4.0000 mg | Freq: Four times a day (QID) | INTRAMUSCULAR | Status: DC | PRN

## 2016-12-11 MED ORDER — ENOXAPARIN SODIUM 40 MG/0.4ML ~~LOC~~ SOLN
40.0000 mg | SUBCUTANEOUS | Status: DC
  Administered 2016-12-12 – 2016-12-15 (×4): 40 mg via SUBCUTANEOUS
  Filled 2016-12-11 (×4): qty 0.4

## 2016-12-11 MED ORDER — FUROSEMIDE 40 MG PO TABS: 40.0000 mg | ORAL_TABLET | Freq: Every day | ORAL | 3 refills | Status: DC

## 2016-12-11 MED ORDER — ACETAMINOPHEN 325 MG PO TABS: 650.0000 mg | ORAL_TABLET | ORAL | Status: DC | PRN

## 2016-12-11 MED ORDER — ENOXAPARIN SODIUM 40 MG/0.4ML ~~LOC~~ SOLN
40.0000 mg | SUBCUTANEOUS | Status: DC
  Filled 2016-12-11: qty 0.4

## 2016-12-11 MED ORDER — POTASSIUM CHLORIDE CRYS ER 10 MEQ PO TBCR: 10.0000 meq | EXTENDED_RELEASE_TABLET | Freq: Every day | ORAL | 3 refills | Status: DC

## 2016-12-11 MED ORDER — SODIUM CHLORIDE 0.9% FLUSH
3.0000 mL | Freq: Two times a day (BID) | INTRAVENOUS | Status: DC
  Administered 2016-12-11 – 2016-12-17 (×10): 3 mL via INTRAVENOUS

## 2016-12-11 NOTE — H&P (Addendum)
Cardiology ADMISSION   Date:  12/11/2016   ID:  Christine Hicks, DOB 1955-09-06, MRN [id number]  PCP:  Billee Cashing, MD  Cardiologist: Lesleigh Noe, MD   Chief Complaint  Patient presents with  . Cardiac Valve Problem    AS    History of Present Illness:  Christine Hicks is a 61 y.o. female with history of bicuspid aortic valve, progressive aortic stenosis, and hypertension.  Obese 62 year old female with congenitally bicuspid aortic valve and a several year history of critical aortic stenosis for which she has refused therapy. Recently she has begun experiencing orthopnea and palpitations with recumbency. Recent echocardiogram demonstrated progression of aortic stenosis now with a peak velocity 5.97 m/s, peak gradient 143 mmHg (compared with 4.8 m/s and 92 mmHg one year ago). She denies chest pain. She has not had syncope. She is having PND and palpitations that cause her to set up at night.  She refused workup last year. She skipped a six-month follow-up but we had scheduled 1 year ago. Developing symptoms caused her to reestablish contact with [place] and recent echo demonstrated significant progression.  Past Medical History:  Diagnosis Date  . Anemia, iron deficiency    negative egd/colonoscopy 11/16/2015  . Bicuspid aortic valve 11/11/2015  . Hypertension   . Thyroid disease     Past Surgical History:  Procedure Laterality Date  . 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 COLONOSCOPY N/A 11/16/2015   Procedure: COLONOSCOPY;  Surgeon: Iva Boop, MD;  Location: [facility] ENDOSCOPY;  Service: Endoscopy;  Laterality: N/A;  . ESOPHAGOGASTRODUODENOSCOPY N/A 11/16/2015   Procedure: ESOPHAGOGASTRODUODENOSCOPY (EGD);  Surgeon: Iva Boop, MD;  Location: [facility] ENDOSCOPY;  Service: Endoscopy;  Laterality: N/A;  . HERNIA REPAIR    . Right leg cellulitis surgery      Current Medications: Outpatient Medications Prior to Visit  Medication Sig Dispense  Refill  . apixaban (ELIQUIS) 5 MG TABS tablet Take 1 tablet (5 mg total) by mouth 2 (two) times daily. 180 tablet 3  . benazepril (LOTENSIN) 20 MG tablet Take 1 tablet (20 mg total) by mouth daily. 90 tablet 3  . ferrous sulfate 325 (65 FE) MG tablet Take 1 tablet (325 mg total) by mouth 2 (two) times daily with a meal. 60 tablet 0  . guaiFENesin (MUCINEX) 600 MG 12 hr tablet Take 600 mg by mouth 2 (two) times daily as needed for cough or to loosen phlegm.    Marland Kitchen levothyroxine (SYNTHROID, LEVOTHROID) 175 MCG tablet Take 175 mcg by mouth daily before breakfast.    . pantoprazole (PROTONIX) 40 MG tablet TAKE 1 TABLET (40 MG TOTAL) BY MOUTH DAILY. 30 tablet 6  . furosemide (LASIX) 40 MG tablet Take 1 tablet (40 mg total) by mouth daily. 30 tablet 11  . KLOR-CON M10 10 MEQ tablet TAKE 1 TABLET (10 MEQ TOTAL) BY MOUTH DAILY. 90 tablet 3  . meclizine (ANTIVERT) 25 MG tablet Take 1 tablet (25 mg total) by mouth 2 (two) times daily as needed for dizziness. (Patient not taking: Reported on 12/11/2016) 30 tablet 0  . traMADol (ULTRAM) 50 MG tablet Take 50 mg to 100 mg by mouth three (3) times daily as needed for pain.    Marland Kitchen zolpidem (AMBIEN) 5 MG tablet Take 1 tablet (5 mg total) by mouth at bedtime as needed for sleep. (Patient not taking: Reported on 12/11/2016) 10 tablet 0   No facility-administered medications prior to visit.  Allergies:   Patient has no known allergies.   Social History   Social History  . Marital status: Married    Spouse name: N/A  . Number of children: N/A  . Years of education: N/A   Social History Main Topics  . Smoking status: Former Games developer  . Smokeless tobacco: Never Used  . Alcohol use No  . Drug use: Unknown  . Sexual activity: Yes   Other Topics Concern  . None   Social History Narrative  . None     Family History:  The patient's family history includes Cardiomyopathy in her son; Emphysema in her father; Heart attack in her mother; Lung cancer in her  maternal aunt.   ROS:   Please see the history of present illness.    Difficulty sleeping. Muscle pain and back pain. Lower extremity swelling. Leg pain, irregular heartbeat, excessive fatigue, joint swelling, and difficulty with bowel  All other systems reviewed and are negative.   PHYSICAL EXAM:   VS:  BP (!) 128/104   Pulse 93   Ht 5\' 5"  (1.651 m)   Wt 288 lb (130.6 kg)   LMP  (LMP Unknown)   BMI 47.93 kg/m    GEN: Well nourished, well developed, in no acute distress. Morbid obesity.  HEENT: normal  Neck: no JVD, carotid bruits, or masses Cardiac: RRR,  rubs, but with an S4 gallop. 2+ bilateral ankle edema.  Respiratory:  clear to auscultation bilaterally, normal work of breathing GI: soft, nontender, nondistended, + BS MS: no deformity or atrophy  Skin: warm and dry, no rash Neuro:  Alert and Oriented x 3, Strength and sensation are intact Psych: euthymic mood, full affect  Wt Readings from Last 3 Encounters:  12/11/16 288 lb (130.6 kg)  06/12/16 286 lb 6.4 oz (129.9 kg)  02/05/16 263 lb (119.3 kg)      Studies/Labs Reviewed:   EKG:  EKG  Right bundle, sinus rhythm, left atrial abnormality.  Recent Labs: 02/05/2016: BUN 9; Creatinine, Ser 0.81; Hemoglobin 14.6; Platelets 211; Potassium 3.8; Sodium 138   Lipid Panel    Component Value Date/Time   CHOL  09/11/2009 0555    124        ATP III CLASSIFICATION:  <200     mg/dL   Desirable  161-096  mg/dL   Borderline High  >=045    mg/dL   High          TRIG 409 (H) 09/11/2009 0555   HDL 14 (L) 09/11/2009 0555   CHOLHDL 8.9 09/11/2009 0555   VLDL 40 09/11/2009 0555   LDLCALC  09/11/2009 0555    70        Total Cholesterol/HDL:CHD Risk Coronary Heart Disease Risk Table                     Men   Women  1/2 Average Risk   3.4   3.3  Average Risk       5.0   4.4  2 X Average Risk   9.6   7.1  3 X Average Risk  23.4   11.0        Use the calculated Patient Ratio above and the CHD Risk Table to determine the  patient's CHD Risk.        ATP III CLASSIFICATION (LDL):  <100     mg/dL   Optimal  811-914  mg/dL   Near or Above  Optimal  130-159  mg/dL   Borderline  960-454  mg/dL   High  >098     mg/dL   Very High    Additional studies/ records that were reviewed today include:   Echocardiography from April 2018:  Study Conclusions  - Left ventricle: The cavity size was normal. Wall thickness was   increased in a pattern of moderate LVH. Systolic function was   normal. The estimated ejection fraction was in the range of 55%   to 60%. Wall motion was normal; there were no regional wall   motion abnormalities. Doppler parameters are consistent with   abnormal left ventricular relaxation (grade 1 diastolic   dysfunction). - Aortic valve: There was severe stenosis. Mean gradient (S): 82 mm; Peak gradient 148 mmHg   Hg. Valve area (VTI): 0.42 cm^2. Valve area (Vmean): 0.43 cm^2. - Left atrium: The atrium was mildly dilated. - Right atrium: The atrium was mildly dilated. - Tricuspid valve: There was moderate regurgitation. - Pulmonary arteries: Systolic pressure was mildly to moderately   increased. PA peak pressure: 45 mm Hg (S).  Impressions:  - Aortic stenosis parameters have progressed compared to previous   echo 10/2015.   ASSESSMENT:    1. Aortic stenosis, severe   2. Acute on chronic diastolic heart failure (HCC)   3. Bicuspid aortic valve   4. Acute thromboembolism of deep veins of lower extremity, unspecified laterality (HCC)   5. Essential hypertension   6. Hypothyroidism, unspecified type   7. Palpitations      PLAN:  In order of problems listed above:  1. Progression and now symptomatic critical aortic stenosis in a patient with bicuspid aortic valve with progressive  orthopnea. Plan is to move to right and left heart cath as soon as possible. She is currently volume overloaded and after long discussion with the patient have decided to admit to  the hospital for tuning gentle diuresis prior to heart cath next Tuesday by me or Monday by a colleague if ready clinically. Cardiac catheterization has been described to the patient and family in detail including risk of bleeding, death, stroke, myocardial infarction, among other potential problems. 2. Admitted to the hospital for IV diuresis by monitoring for evidence of hypotension. Initially we were going to attempt this as an outpatient but intuition causes me to feel it would be safer to do it in a hospital with close follow-up. Should probably remain hospitalized until cath early next week. With such a high gradient, I have decided to hold her ACE inhibitor. I have ordered 4 doses of Lasix at 40 mg every 12 hours. Both potassium and Lasix will need to be re-ordered on Sunday. Blood pressure needs to be watched very closely. 3. Known bicuspid aortic valve now progressed to critical aortic stenosis. 4. History of recurrent DVT and is on chronic long-term oral anticoagulation therapy to prevent recurrence. We'll discontinue eloquent's and convert IV heparin until after cath next week. 5. Not currently hypertensive likely related to progression of critical aortic stenosis. 6. We'll check thyroid function studies 7. Telemetry will help determine if she is having sustained arrhythmias at night or if the palpitations are simply tachycardia related to heart failure. The patient was literally afraid to go home because she has not been able to sleep in her bed for the past 5 days because of palpitations and severe dyspnea.   The patient was counseled to undergo left heart catheterization, coronary angiography, and possible percutaneous coronary intervention with stent implantation. The  procedural risks and benefits were discussed in detail. The risks discussed included death, stroke, myocardial infarction, life-threatening bleeding, limb ischemia, kidney injury, allergy, and possible emergency cardiac surgery.  The risk of these significant complications were estimated to occur less than 1% of the time. After discussion, the patient has agreed to proceed.  Overall, the patient is seriously ill, has developed progressive critical aortic stenosis now with acute on chronic diastolic heart failure (please see echocardiogram from April 2018.

## 2016-12-11 NOTE — Assessment & Plan Note (Signed)
Bicuspid aortic valve with transvalvular peak velocity of 5.95 m per sec in April 2018

## 2016-12-11 NOTE — Assessment & Plan Note (Signed)
Recurrent episodes of DVT

## 2016-12-11 NOTE — Patient Instructions (Addendum)
Medication Instructions:  None  Labwork: None   Testing/Procedures: Your physician has requested that you have a cardiac catheterization on 12/16/16. Cardiac catheterization is used to diagnose and/or treat various heart conditions. Doctors may recommend this procedure for a number of different reasons. The most common reason is to evaluate chest pain. Chest pain can be a symptom of coronary artery disease (CAD), and cardiac catheterization can show whether plaque is narrowing or blocking your heart's arteries. This procedure is also used to evaluate the valves, as well as measure the blood flow and oxygen levels in different parts of your heart. For further information please visit [email or web address] Please follow instruction sheet, as given.   Follow-Up: Your physician recommends that you schedule a follow-up appointment 2 weeks after heart catheterization with a PA or NP.   Any Other Special Instructions Will Be Listed Below (If Applicable).  You are being admitted to hospital for IV diuresis.    If you need a refill on your cardiac medications before your next appointment, please call your pharmacy.

## 2016-12-12 ENCOUNTER — Inpatient Hospital Stay (HOSPITAL_COMMUNITY): Payer: Medicare Other

## 2016-12-12 LAB — BASIC METABOLIC PANEL
ANION GAP: 9 (ref 5–15)
BUN: 9 mg/dL (ref 6–20)
CALCIUM: 9.5 mg/dL (ref 8.9–10.3)
CHLORIDE: 102 mmol/L (ref 101–111)
CO2: 27 mmol/L (ref 22–32)
Creatinine, Ser: 0.76 mg/dL (ref 0.44–1.00)
GFR calc non Af Amer: >60 mL/min (ref >60)
GLUCOSE: 119 mg/dL — AB (ref 65–99)
POTASSIUM: 3.9 mmol/L (ref 3.5–5.1)
Sodium: 138 mmol/L (ref 135–145)

## 2016-12-12 LAB — HIV ANTIBODY (ROUTINE TESTING W REFLEX): HIV Screen 4th Generation wRfx: NONREACTIVE

## 2016-12-12 MED ORDER — ZOLPIDEM TARTRATE 5 MG PO TABS
5.0000 mg | ORAL_TABLET | Freq: Every evening | ORAL | Status: DC | PRN
Start: 2016-12-12 — End: 2016-12-17
  Administered 2016-12-12 – 2016-12-16 (×5): 5 mg via ORAL
  Filled 2016-12-12 (×5): qty 1

## 2016-12-12 MED ORDER — LEVOTHYROXINE SODIUM 75 MCG PO TABS
150.0000 ug | ORAL_TABLET | Freq: Every day | ORAL | Status: DC
  Administered 2016-12-13 – 2016-12-17 (×5): 150 ug via ORAL
  Filled 2016-12-12 (×5): qty 2

## 2016-12-12 MED ORDER — FUROSEMIDE 10 MG/ML IJ SOLN
40.0000 mg | Freq: Two times a day (BID) | INTRAMUSCULAR | Status: AC
  Administered 2016-12-12 – 2016-12-13 (×2): 40 mg via INTRAVENOUS
  Filled 2016-12-12 (×2): qty 4

## 2016-12-12 MED ORDER — POTASSIUM CHLORIDE CRYS ER 20 MEQ PO TBCR
20.0000 meq | EXTENDED_RELEASE_TABLET | Freq: Every day | ORAL | Status: AC
  Administered 2016-12-13 – 2016-12-14 (×2): 20 meq via ORAL
  Filled 2016-12-12 (×2): qty 1

## 2016-12-12 NOTE — Plan of Care (Signed)
Problem: Food- and Nutrition-Related Knowledge Deficit (NB-1.1) Goal: Nutrition education Formal process to instruct or train a patient/client in a skill or to impart knowledge to help patients/clients voluntarily manage or modify food choices and eating behavior to maintain or improve health.  Outcome: Completed/Met Date Met: 12/12/16 Nutrition Education Note  RD consulted for nutrition education regarding CHF. Pt reports she has been educated on a heart healthy, low sodium diet in the past but acknolwedges that she has not been strictly following the diet.   RD provided "Heart Failure Nutrition Therapy" handout from the [employer]. Reviewed patient's dietary recall. Provided examples on ways to decrease sodium intake in diet. Discouraged intake of processed foods and use of salt shaker. Encouraged fresh fruits and vegetables as well as whole grain sources of carbohydrates to maximize fiber intake.   RD discussed why it is important for patient to adhere to diet recommendations, and emphasized the role of fluids, foods to avoid, and importance of weighing self daily. Teach back method used.  Expect good compliance.  Body mass index is 46.88 kg/m. Pt meets criteria for morbid obesity based on current BMI.  Current diet order is Heart Healthy/Carb Modified; pt with no hx of DM, not checking CBGs, and pt not on DM meds. RD will modify diet to Heart Healthy only. Pt with good appetite and is consuming approximately 100% of meals at this time. Labs and medications reviewed. No further nutrition interventions warranted at this time. RD contact information provided. If additional nutrition issues arise, please re-consult RD.   Kerman Passey MS, RD, LDN [phone] Pager  [phone] Weekend/On-Call Pager

## 2016-12-12 NOTE — Progress Notes (Addendum)
Progress Note  Patient Name: Christine Hicks Date of Encounter: 12/12/2016  Primary Cardiologist: Dr. Katrinka BlazingSmith  Subjective   The patient is breathing better and lower extremity edema has gone down a little, still has pitting edema. She did not notice any palpitations last night and no ectopy noted on monitor. She felt that her sleeping was a little more comfortable but still did not sleep well. Has insomnia and uses ambien at home as needed. [place]   Inpatient Medications    Scheduled Meds: . aspirin EC  81 mg Oral Daily  . enoxaparin (LOVENOX) injection  40 mg Subcutaneous Q24H  . furosemide  40 mg Intravenous Q12H  . potassium chloride  20 mEq Oral Daily  . sodium chloride flush  3 mL Intravenous Q12H   Continuous Infusions: . sodium chloride     PRN Meds: sodium chloride, acetaminophen, ondansetron (ZOFRAN) IV, sodium chloride flush   Vital Signs    Vitals:   12/11/16 1236 12/11/16 2213 12/12/16 0000 12/12/16 0500  BP: 130/84 (!) 128/57 130/60 132/62  Pulse: 74 74 75 71  Resp: 18 17 18 18   Temp: 98.2 F (36.8 C) 98 F (36.7 C) 98.2 F (36.8 C) 98 F (36.7 C)  TempSrc: Oral Oral Oral Oral  SpO2: 97% 98% 99%   Weight: 283 lb 3.2 oz (128.5 kg)   281 lb 11.2 oz (127.8 kg)  Height: 5\' 5"  (1.651 m)       Intake/Output Summary (Last 24 hours) at 12/12/16 1004 Last data filed at 12/12/16 0856  Gross per 24 hour  Intake              360 ml  Output             2400 ml  Net            -2040 ml   Filed Weights   12/11/16 1236 12/12/16 0500  Weight: 283 lb 3.2 oz (128.5 kg) 281 lb 11.2 oz (127.8 kg)    Telemetry    Sinus rhythm in the 60's with no ectopy- Personally Reviewed  ECG    Sinus rhythm, 66 bpm with occasional Premature ventricular complexes, Right bundle branch block, nonspecific ST/T changes inferiorly also noted in 01/2016  - Personally Reviewed  Physical Exam   GEN: No acute distress.   Neck: No JVD Cardiac: RRR, 4/6  aortic murmur transmits to back and carotids  Respiratory: Clear to auscultation bilaterally. GI: Soft, nontender, large and bumpy due to multiple previous surgeries MS: 1-2+ LE edema, R>L Neuro:  Nonfocal  Psych: Normal affect   Labs    Chemistry Recent Labs Lab 12/11/16 1434 12/12/16 0536  NA 140 138  K 4.1 3.9  CL 108 102  CO2 25 27  GLUCOSE 118* 119*  BUN 8 9  CREATININE 0.83 0.76  CALCIUM 9.4 9.5  PROT 7.5  --   ALBUMIN 3.7  --   AST 38  --   ALT 26  --   ALKPHOS 78  --   BILITOT 1.0  --   GFRNONAA >60 >60  GFRAA >60 >60  ANIONGAP 7 9     Hematology Recent Labs Lab 12/11/16 1434  WBC 7.0  RBC 4.83  HGB 15.3*  HCT 47.7*  MCV 98.8  MCH 31.7  MCHC 32.1  RDW 14.0  PLT 168    Cardiac Enzymes Recent Labs Lab 12/11/16 1434  TROPONINI <0.03   No results for input(s): TROPIPOC in  the last 168 hours.   BNP Recent Labs Lab 12/11/16 1434  BNP 81.6     DDimer No results for input(s): DDIMER in the last 168 hours.   Radiology    Dg Chest 2 View  Result Date: 12/12/2016 CLINICAL DATA:  History of aortic valve disease.  Evaluate for CHF. EXAM: CHEST  2 VIEW COMPARISON:  11/12/2015 FINDINGS: Chronic cardiomegaly. Stable aortic and hilar contours. There is interstitial prominence with occasional Kerley line seen bilaterally. Trace pleural effusions. IMPRESSION: 1. Trace pleural effusions and possible early interstitial edema. 2. Cardiomegaly. Electronically Signed   By: Marnee Spring M.D.   On: 12/12/2016 09:28    Cardiac Studies   Echo 11/20/16 LV EF: 55% -   60%  ------------------------------------------------------------------- History:   PMH:   Dyspnea.  Aortic valve disease.  Risk factors: Former tobacco use. Diabetes mellitus. Obese.  ------------------------------------------------------------------- Study Conclusions  - Left ventricle: The cavity size was normal. Wall thickness was   increased in a pattern of moderate LVH. Systolic  function was   normal. The estimated ejection fraction was in the range of 55%   to 60%. Wall motion was normal; there were no regional wall   motion abnormalities. Doppler parameters are consistent with   abnormal left ventricular relaxation (grade 1 diastolic   dysfunction). - Aortic valve: There was severe stenosis. Mean gradient (S): 82 mm   Hg. Valve area (VTI): 0.42 cm^2. Valve area (Vmean): 0.43 cm^2. - Left atrium: The atrium was mildly dilated. - Right atrium: The atrium was mildly dilated. - Tricuspid valve: There was moderate regurgitation. - Pulmonary arteries: Systolic pressure was mildly to moderately   increased. PA peak pressure: 45 mm Hg (S).  Impressions:  - Aortic stenosis parameters have progressed compared to previous   echo 10/2015.   Patient Profile     61 y.o. female with history of bicuspid aortic valve, progressive aortic stenosis, Thyroid disease, iron deficiency anemia, hernia repair with complications and multiple surgeries and hypertension orthopnea and palpitations with recumbency, admitted for gentle IV diuresis and monitoring with plans for cardiac catheterization early next week.  Assessment & Plan    1. Severe aortic stenosis -Progression in symptomatic critical aortic stenosis in patient with bicuspid aortic valve -Progressive symptoms of orthopnea and palpitations -Gentle diuresis with IV diuresis with Lasix 40 mg IV twice a day, has received 2 doses thus far -Weight is down 2 pounds and the patient has had 2.4 L of urine output with an overall net -2.2 L  -Monitoring blood pressure and potassium. Blood pressures have been stable with SBP in the 130s, potassium level is 3.9 today. -Kidney function is normal with serum creatinine 0.76 today -Lasix doses have been ordered through Saturday night and will need to be readdressed on Sunday morning (both Lasix and potassium) -Plan is for cardiac catheterization to further assess aortic valve by Dr.  Katrinka Blazing on Tuesday for by a colleague if patient is ready by Monday  2. Palpitations -Pt was having strange feeling in her chest when lying down, but was most likely othopnea. Did not this last night and no ectopy was seen on telemetry -TSH is low at 0.080. Patient takes levothyroxine 175 g daily at home, will decrease dose to 150 micrograms daily  3. Hypertension -Managed at home with benazepril 20 mg daily-currently on hold during diuresis prior to cardiac catheterization -Avoid hypotension in setting of critical aortic stenosis  4. History of recurrent DVT  -on chronic long-term anticoagulation with Eliquis, which  is on hold in preparation for cath.   Signed, Berton Bon, NP  12/12/2016, 10:04 AM    I have seen and examined the patient along with Berton Bon, NP .  I have reviewed the chart, notes and new data.  I agree with NP's note.  Key new complaints: feeling better, less orthopneic. No angina or dizziness. Key examination changes: still has 2-3+ pitting dependent edema. Late peaking musical AS murmur. Morbid obesity Key new findings / data: renal parameters remain normal  PLAN: So far tolerating diuresis. Watch for hypotension or other signs of low cardiac output with diuresis in setting of critical AS. Discussed purpose of heart cath and how it will inform the decision re: SAVR vs TAVR. She is at increased risk with SAVR due to morbid obesity and deconditioning.  Thurmon Fair, MD, [facility] [phone] 12/12/2016, 1:40 PM

## 2016-12-13 DIAGNOSIS — I82503 Chronic embolism and thrombosis of unspecified deep veins of lower extremity, bilateral: Secondary | ICD-10-CM

## 2016-12-13 DIAGNOSIS — Q231 Congenital insufficiency of aortic valve: Principal | ICD-10-CM

## 2016-12-13 LAB — BASIC METABOLIC PANEL
Anion gap: 10 (ref 5–15)
BUN: 14 mg/dL (ref 6–20)
CHLORIDE: 103 mmol/L (ref 101–111)
CO2: 24 mmol/L (ref 22–32)
Calcium: 9.3 mg/dL (ref 8.9–10.3)
Creatinine, Ser: 0.74 mg/dL (ref 0.44–1.00)
GFR calc Af Amer: >60 mL/min (ref >60)
GFR calc non Af Amer: >60 mL/min (ref >60)
GLUCOSE: 112 mg/dL — AB (ref 65–99)
POTASSIUM: 3.7 mmol/L (ref 3.5–5.1)
SODIUM: 137 mmol/L (ref 135–145)

## 2016-12-13 NOTE — Progress Notes (Signed)
Progress Note  Patient Name: Christine PattenJudith M Davis Date of Encounter: 12/13/2016  Primary Cardiologist: Dr. Verdis PrimeHenry Smith  Subjective   Sitting in bed side chair. Feels less tight in her abdomen and legs. Orthopnea improving but not completely resolved. No chest pain.  Inpatient Medications    Scheduled Meds: . aspirin EC  81 mg Oral Daily  . enoxaparin (LOVENOX) injection  40 mg Subcutaneous Q24H  . levothyroxine  150 mcg Oral QAC breakfast  . potassium chloride  20 mEq Oral Daily  . sodium chloride flush  3 mL Intravenous Q12H   Continuous Infusions: . sodium chloride     PRN Meds: sodium chloride, acetaminophen, ondansetron (ZOFRAN) IV, sodium chloride flush, zolpidem   Vital Signs    Vitals:   12/12/16 1012 12/12/16 2010 12/12/16 2344 12/13/16 0507  BP: (!) 163/65 (!) 145/65 128/62 (!) 139/55  Pulse: 71 70 79 75  Resp: 18 18 18 20   Temp: 97.7 F (36.5 C) 98.5 F (36.9 C) 98.2 F (36.8 C) 97.9 F (36.6 C)  TempSrc: Oral Oral Oral Oral  SpO2: 100% 97% 94% 96%  Weight:    277 lb 1.6 oz (125.7 kg)  Height:        Intake/Output Summary (Last 24 hours) at 12/13/16 1118 Last data filed at 12/13/16 0635  Gross per 24 hour  Intake              960 ml  Output             2525 ml  Net            -1565 ml   Filed Weights   12/11/16 1236 12/12/16 0500 12/13/16 0507  Weight: 283 lb 3.2 oz (128.5 kg) 281 lb 11.2 oz (127.8 kg) 277 lb 1.6 oz (125.7 kg)    Telemetry    Sinus rhythm. Personally reviewed.  ECG    Tracing from 12/13/2016 shows sinus rhythm with right bundle branch block and probable left atrial enlargement. Personally reviewed.  Physical Exam   GEN: Morbidly obese woman. No acute distress.   Neck: No JVD. Cardiac: RRR, 4/6 basal systolic murmur radiating up to carotids consistent with aortic stenosis, no gallop.  Respiratory: Nonlabored. Clear to auscultation bilaterally. GI: Soft, nontender, bowel sounds present. MS: 2+ leg edema; No  deformity. Neuro:  Nonfocal. Psych: Alert and oriented x 3. Normal affect.  Labs    Chemistry Recent Labs Lab 12/11/16 1434 12/12/16 0536 12/13/16 0359  NA 140 138 137  K 4.1 3.9 3.7  CL 108 102 103  CO2 25 27 24   GLUCOSE 118* 119* 112*  BUN 8 9 14   CREATININE 0.83 0.76 0.74  CALCIUM 9.4 9.5 9.3  PROT 7.5  --   --   ALBUMIN 3.7  --   --   AST 38  --   --   ALT 26  --   --   ALKPHOS 78  --   --   BILITOT 1.0  --   --   GFRNONAA >60 >60 >60  GFRAA >60 >60 >60  ANIONGAP 7 9 10      Hematology Recent Labs Lab 12/11/16 1434  WBC 7.0  RBC 4.83  HGB 15.3*  HCT 47.7*  MCV 98.8  MCH 31.7  MCHC 32.1  RDW 14.0  PLT 168    Cardiac Enzymes Recent Labs Lab 12/11/16 1434  TROPONINI <0.03   No results for input(s): TROPIPOC in the last 168 hours.   BNP Recent Labs Lab  12/11/16 1434  BNP 81.6     Radiology    Dg Chest 2 View  Result Date: 12/12/2016 CLINICAL DATA:  History of aortic valve disease.  Evaluate for CHF. EXAM: CHEST  2 VIEW COMPARISON:  11/12/2015 FINDINGS: Chronic cardiomegaly. Stable aortic and hilar contours. There is interstitial prominence with occasional Kerley line seen bilaterally. Trace pleural effusions. IMPRESSION: 1. Trace pleural effusions and possible early interstitial edema. 2. Cardiomegaly. Electronically Signed   By: Marnee Spring M.D.   On: 12/12/2016 09:28    Cardiac Studies   Echocardiogram 11/20/2016: Study Conclusions  - Left ventricle: The cavity size was normal. Wall thickness was   increased in a pattern of moderate LVH. Systolic function was   normal. The estimated ejection fraction was in the range of 55%   to 60%. Wall motion was normal; there were no regional wall   motion abnormalities. Doppler parameters are consistent with   abnormal left ventricular relaxation (grade 1 diastolic   dysfunction). - Aortic valve: There was severe stenosis. Mean gradient (S): 82 mm   Hg. Valve area (VTI): 0.42 cm^2. Valve area  (Vmean): 0.43 cm^2. - Left atrium: The atrium was mildly dilated. - Right atrium: The atrium was mildly dilated. - Tricuspid valve: There was moderate regurgitation. - Pulmonary arteries: Systolic pressure was mildly to moderately   increased. PA peak pressure: 45 mm Hg (S).  Impressions:  - Aortic stenosis parameters have progressed compared to previous   echo 10/2015.  Patient Profile     61 y.o. female with bicuspid aortic valve and progressive aortic stenosis, hypertension, history of recurrent DVT on Eliquis as an outpatient, now admitted with progressive symptoms including shortness of breath and orthopnea in the setting of volume overload. She is admitted for gentle IV diuresis in preparation for cardiac catheterization next week with Dr. Katrinka Blazing.  Assessment & Plan    1. Bicuspid valve with severe aortic stenosis as outlined above. This is, complicated by volume overload and she is responding well to gentle diuresis, weight is down around 5 pounds. She has had no hypotension and renal function is stable. Tentatively, cardiac catheterization is scheduled with Dr. Katrinka Blazing on Tuesday.  2. Essential hypertension. Lotensin is on hold for now pending cardiac catheterization and with current diuresis. Systolic blood pressure 130s.  3. Hypothyroidism, on Synthroid. Recent Synthroid dose reduced in light of TSH 0.08.  4. History of recurrent DVT, on Eliquis as an outpatient which is presently on hold in preparation for cardiac catheterization.  Review chart and situation discussed with patient. She is feeling somewhat better with diuresis and has Lasix ordered through today per Dr. Royann Shivers. Blood pressure and renal function are stable. Will reassess tomorrow to see if she needs any further diuretic. Cardiac catheterization is tentatively planned for Tuesday with Dr. Katrinka Blazing.  Signed, Nona Dell, MD  12/13/2016, 11:18 AM

## 2016-12-14 LAB — CBC
HEMATOCRIT: 46.8 % — AB (ref 36.0–46.0)
Hemoglobin: 15.5 g/dL — ABNORMAL HIGH (ref 12.0–15.0)
MCH: 32 pg (ref 26.0–34.0)
MCHC: 33.1 g/dL (ref 30.0–36.0)
MCV: 96.5 fL (ref 78.0–100.0)
Platelets: 128 10*3/uL — ABNORMAL LOW (ref 150–400)
RBC: 4.85 MIL/uL (ref 3.87–5.11)
RDW: 13.5 % (ref 11.5–15.5)
WBC: 7 10*3/uL (ref 4.0–10.5)

## 2016-12-14 LAB — BASIC METABOLIC PANEL
ANION GAP: 11 (ref 5–15)
BUN: 11 mg/dL (ref 6–20)
CALCIUM: 9.1 mg/dL (ref 8.9–10.3)
CHLORIDE: 102 mmol/L (ref 101–111)
CO2: 25 mmol/L (ref 22–32)
Creatinine, Ser: 0.66 mg/dL (ref 0.44–1.00)
GFR calc Af Amer: >60 mL/min (ref >60)
Glucose, Bld: 104 mg/dL — ABNORMAL HIGH (ref 65–99)
POTASSIUM: 3.5 mmol/L (ref 3.5–5.1)
SODIUM: 138 mmol/L (ref 135–145)

## 2016-12-14 MED ORDER — FUROSEMIDE 10 MG/ML IJ SOLN
40.0000 mg | Freq: Once | INTRAMUSCULAR | Status: AC
  Administered 2016-12-14: 40 mg via INTRAVENOUS
  Filled 2016-12-14: qty 4

## 2016-12-14 NOTE — Progress Notes (Signed)
Progress Note  Patient Name: Christine Hicks Date of Encounter: 12/14/2016  Primary Cardiologist: Dr. Verdis Prime  Subjective   Slept a little bit better last night but still has some mild orthopnea. Leg swelling is improving. No chest pain.  Inpatient Medications    Scheduled Meds: . aspirin EC  81 mg Oral Daily  . enoxaparin (LOVENOX) injection  40 mg Subcutaneous Q24H  . levothyroxine  150 mcg Oral QAC breakfast  . potassium chloride  20 mEq Oral Daily  . sodium chloride flush  3 mL Intravenous Q12H   Continuous Infusions: . sodium chloride     PRN Meds: sodium chloride, acetaminophen, ondansetron (ZOFRAN) IV, sodium chloride flush, zolpidem   Vital Signs    Vitals:   12/13/16 0507 12/13/16 1148 12/13/16 1938 12/14/16 0430  BP: (!) 139/55 (!) 128/59 (!) 128/58 (!) 147/68  Pulse: 75 (!) 59 76 76  Resp: 20 18 18 18   Temp: 97.9 F (36.6 C) 98.3 F (36.8 C) 97.8 F (36.6 C) 97.7 F (36.5 C)  TempSrc: Oral Oral Oral Oral  SpO2: 96% 96% 96% 98%  Weight: 277 lb 1.6 oz (125.7 kg)   277 lb 3.2 oz (125.7 kg)  Height:        Intake/Output Summary (Last 24 hours) at 12/14/16 0929 Last data filed at 12/14/16 0700  Gross per 24 hour  Intake              720 ml  Output             2301 ml  Net            -1581 ml   Filed Weights   12/12/16 0500 12/13/16 0507 12/14/16 0430  Weight: 281 lb 11.2 oz (127.8 kg) 277 lb 1.6 oz (125.7 kg) 277 lb 3.2 oz (125.7 kg)    Telemetry    Sinus rhythm. Personally reviewed.  ECG    Tracing from 12/14/2016 shows sinus rhythm with LAE and right bundle branch block. Personally reviewed.  Physical Exam   GEN: Morbidly obese woman. Appears comfortable in bedside chair..   Neck: No JVD. Cardiac: RRR, 4/6 basal systolic murmur radiating up to carotids, no gallop. Respiratory: Nonlabored. Clear to auscultation bilaterally. GI: Soft, nontender, bowel sounds present. MS: 2+ leg edema with greater than left; No deformity.  Labs      Chemistry  Recent Labs Lab 12/11/16 1434 12/12/16 0536 12/13/16 0359 12/14/16 0355  NA 140 138 137 138  K 4.1 3.9 3.7 3.5  CL 108 102 103 102  CO2 25 27 24 25   GLUCOSE 118* 119* 112* 104*  BUN 8 9 14 11   CREATININE 0.83 0.76 0.74 0.66  CALCIUM 9.4 9.5 9.3 9.1  PROT 7.5  --   --   --   ALBUMIN 3.7  --   --   --   AST 38  --   --   --   ALT 26  --   --   --   ALKPHOS 78  --   --   --   BILITOT 1.0  --   --   --   GFRNONAA >60 >60 >60 >60  GFRAA >60 >60 >60 >60  ANIONGAP 7 9 10 11      Hematology  Recent Labs Lab 12/11/16 1434 12/14/16 0355  WBC 7.0 7.0  RBC 4.83 4.85  HGB 15.3* 15.5*  HCT 47.7* 46.8*  MCV 98.8 96.5  MCH 31.7 32.0  MCHC 32.1 33.1  RDW  14.0 13.5  PLT 168 128*    Cardiac Enzymes  Recent Labs Lab 12/11/16 1434  TROPONINI <0.03   No results for input(s): TROPIPOC in the last 168 hours.   BNP  Recent Labs Lab 12/11/16 1434  BNP 81.6     Cardiac Studies   Echocardiogram 11/20/2016: Study Conclusions  - Left ventricle: The cavity size was normal. Wall thickness was   increased in a pattern of moderate LVH. Systolic function was   normal. The estimated ejection fraction was in the range of 55%   to 60%. Wall motion was normal; there were no regional wall   motion abnormalities. Doppler parameters are consistent with   abnormal left ventricular relaxation (grade 1 diastolic   dysfunction). - Aortic valve: There was severe stenosis. Mean gradient (S): 82 mm   Hg. Valve area (VTI): 0.42 cm^2. Valve area (Vmean): 0.43 cm^2. - Left atrium: The atrium was mildly dilated. - Right atrium: The atrium was mildly dilated. - Tricuspid valve: There was moderate regurgitation. - Pulmonary arteries: Systolic pressure was mildly to moderately   increased. PA peak pressure: 45 mm Hg (S).  Impressions:  - Aortic stenosis parameters have progressed compared to previous   echo 10/2015.  Patient Profile     61 y.o. female with bicuspid aortic  valve and progressive aortic stenosis, hypertension, history of recurrent DVT on Eliquis as an outpatient, now admitted with progressive symptoms including shortness of breath and orthopnea in the setting of volume overload. She is admitted for gentle IV diuresis in preparation for cardiac catheterization next week with Dr. Katrinka BlazingSmith.  Assessment & Plan    1. Bicuspid valve with severe aortic stenosis as outlined above. This is, complicated by acute on chronic diastolic heart failure and she is responding well to gentle diuresis, weight is down around 5 pounds. She has had no hypotension and renal function is stable. Tentatively, cardiac catheterization is scheduled with Dr. Katrinka BlazingSmith on Tuesday.  2. Essential hypertension. Blood pressure 130-140s. Lotensin on hold for now pending cardiac catheterization and with current diuresis.  3. Hypothyroidism, on Synthroid. Recent Synthroid dose reduced in light of TSH 0.08.  4. History of recurrent DVT, on Eliquis as an outpatient which is presently on hold in preparation for cardiac catheterization. She is on Lovenox at this time.  Will give additional Lasix today, would reassess in a.m. rather than standing dose. Continue Lovenox while off Eliquis. Continue to hold Lotensin. Follow-up BMET in a.m.  Signed, Nona DellSamuel Jadrian Bulman, MD  12/14/2016, 9:29 AM

## 2016-12-15 ENCOUNTER — Other Ambulatory Visit: Payer: Self-pay

## 2016-12-15 DIAGNOSIS — I10 Essential (primary) hypertension: Secondary | ICD-10-CM

## 2016-12-15 LAB — CBC
HCT: 44.8 % (ref 36.0–46.0)
Hemoglobin: 14.5 g/dL (ref 12.0–15.0)
MCH: 31.3 pg (ref 26.0–34.0)
MCHC: 32.4 g/dL (ref 30.0–36.0)
MCV: 96.8 fL (ref 78.0–100.0)
PLATELETS: 156 10*3/uL (ref 150–400)
RBC: 4.63 MIL/uL (ref 3.87–5.11)
RDW: 13.5 % (ref 11.5–15.5)
WBC: 6.5 10*3/uL (ref 4.0–10.5)

## 2016-12-15 LAB — BASIC METABOLIC PANEL
Anion gap: 8 (ref 5–15)
Anion gap: 9 (ref 5–15)
BUN: 13 mg/dL (ref 6–20)
BUN: 15 mg/dL (ref 6–20)
CALCIUM: 8.9 mg/dL (ref 8.9–10.3)
CHLORIDE: 102 mmol/L (ref 101–111)
CHLORIDE: 102 mmol/L (ref 101–111)
CO2: 25 mmol/L (ref 22–32)
CO2: 25 mmol/L (ref 22–32)
CREATININE: 0.72 mg/dL (ref 0.44–1.00)
CREATININE: 0.66 mg/dL (ref 0.44–1.00)
Calcium: 9.1 mg/dL (ref 8.9–10.3)
GFR calc non Af Amer: >60 mL/min (ref >60)
Glucose, Bld: 106 mg/dL — ABNORMAL HIGH (ref 65–99)
Glucose, Bld: 111 mg/dL — ABNORMAL HIGH (ref 65–99)
Potassium: 3.6 mmol/L (ref 3.5–5.1)
Potassium: 4 mmol/L (ref 3.5–5.1)
SODIUM: 135 mmol/L (ref 135–145)
SODIUM: 136 mmol/L (ref 135–145)

## 2016-12-15 MED ORDER — FUROSEMIDE 10 MG/ML IJ SOLN
40.0000 mg | Freq: Once | INTRAMUSCULAR | Status: AC
  Administered 2016-12-15: 40 mg via INTRAVENOUS
  Filled 2016-12-15: qty 4

## 2016-12-15 MED ORDER — POTASSIUM CHLORIDE CRYS ER 20 MEQ PO TBCR
20.0000 meq | EXTENDED_RELEASE_TABLET | Freq: Once | ORAL | Status: AC
  Administered 2016-12-15: 20 meq via ORAL
  Filled 2016-12-15: qty 1

## 2016-12-15 MED ORDER — SODIUM CHLORIDE 0.9% FLUSH: 3.0000 mL | INTRAVENOUS | Status: DC | PRN

## 2016-12-15 MED ORDER — SODIUM CHLORIDE 0.9% FLUSH
3.0000 mL | Freq: Two times a day (BID) | INTRAVENOUS | Status: DC
  Administered 2016-12-15: 3 mL via INTRAVENOUS

## 2016-12-15 MED ORDER — SODIUM CHLORIDE 0.9 % IV SOLN: 250.0000 mL | INTRAVENOUS | Status: DC | PRN

## 2016-12-15 MED ORDER — SODIUM CHLORIDE 0.9 % IV SOLN
INTRAVENOUS | Status: DC
Start: 2016-12-16 — End: 2016-12-16
  Administered 2016-12-16: 07:00:00 via INTRAVENOUS

## 2016-12-15 MED ORDER — FUROSEMIDE 10 MG/ML IJ SOLN: 40.0000 mg | Freq: Once | INTRAMUSCULAR | Status: DC

## 2016-12-15 NOTE — Progress Notes (Addendum)
Progress Note  Patient Name: Christine Hicks Date of Encounter: 12/15/2016  Primary Cardiologist: Dr. Verdis PrimeHenry Smith  Subjective   No chest pain and SOB significantly improved  Inpatient Medications    Scheduled Meds: . aspirin EC  81 mg Oral Daily  . enoxaparin (LOVENOX) injection  40 mg Subcutaneous Q24H  . levothyroxine  150 mcg Oral QAC breakfast  . sodium chloride flush  3 mL Intravenous Q12H   Continuous Infusions: . sodium chloride     PRN Meds: sodium chloride, acetaminophen, ondansetron (ZOFRAN) IV, sodium chloride flush, zolpidem   Vital Signs    Vitals:   12/14/16 0430 12/14/16 1207 12/14/16 2111 12/15/16 0625  BP: (!) 147/68 129/78 (!) 127/56 (!) 153/73  Pulse: 76 75 66 67  Resp: 18 18  16   Temp: 97.7 F (36.5 C) 97.3 F (36.3 C) 97.9 F (36.6 C) 97.6 F (36.4 C)  TempSrc: Oral Oral Oral Oral  SpO2: 98% 98% 95% 97%  Weight: 277 lb 3.2 oz (125.7 kg)   267 lb 12.8 oz (121.5 kg)  Height:        Intake/Output Summary (Last 24 hours) at 12/15/16 1014 Last data filed at 12/15/16 0947  Gross per 24 hour  Intake             1375 ml  Output             3201 ml  Net            -1826 ml   Filed Weights   12/13/16 0507 12/14/16 0430 12/15/16 0625  Weight: 277 lb 1.6 oz (125.7 kg) 277 lb 3.2 oz (125.7 kg) 267 lb 12.8 oz (121.5 kg)    Telemetry    NSR - I Personally reviewed.  ECG    Tracing from 12/15/2016 shows sinus rhythm with LAE and right bundle branch block.  -Personally reviewed.  Physical Exam   GEN: WD morbidly obese in NAD  Neck: No JVD or bruit. Cardiac: RRR with 3/6 SM at RUSB radiating into the carotids Respiratory: CTA bilaterally GI: soft, NT, ND with active BS MS: 1+ edema bilaterally Pysch: normal mood Neuro: A&O x 3  Labs    Chemistry  Recent Labs Lab 12/11/16 1434  12/14/16 0355 12/15/16 0001 12/15/16 0453  NA 140  < > 138 135 136  K 4.1  < > 3.5 4.0 3.6  CL 108  < > 102 102 102  CO2 25  < > 25 25 25   GLUCOSE  118*  < > 104* 111* 106*  BUN 8  < > 11 15 13   CREATININE 0.83  < > 0.66 0.72 0.66  CALCIUM 9.4  < > 9.1 9.1 8.9  PROT 7.5  --   --   --   --   ALBUMIN 3.7  --   --   --   --   AST 38  --   --   --   --   ALT 26  --   --   --   --   ALKPHOS 78  --   --   --   --   BILITOT 1.0  --   --   --   --   GFRNONAA >60  < > >60 >60 >60  GFRAA >60  < > >60 >60 >60  ANIONGAP 7  < > 11 8 9   < > = values in this interval not displayed.   Hematology  Recent Labs Lab 12/11/16 1434  12/14/16 0355 12/15/16 0453  WBC 7.0 7.0 6.5  RBC 4.83 4.85 4.63  HGB 15.3* 15.5* 14.5  HCT 47.7* 46.8* 44.8  MCV 98.8 96.5 96.8  MCH 31.7 32.0 31.3  MCHC 32.1 33.1 32.4  RDW 14.0 13.5 13.5  PLT 168 128* 156    Cardiac Enzymes  Recent Labs Lab 12/11/16 1434  TROPONINI <0.03   No results for input(s): TROPIPOC in the last 168 hours.   BNP  Recent Labs Lab 12/11/16 1434  BNP 81.6     Cardiac Studies   Echocardiogram 11/20/2016: Study Conclusions  - Left ventricle: The cavity size was normal. Wall thickness was   increased in a pattern of moderate LVH. Systolic function was   normal. The estimated ejection fraction was in the range of 55%   to 60%. Wall motion was normal; there were no regional wall   motion abnormalities. Doppler parameters are consistent with   abnormal left ventricular relaxation (grade 1 diastolic   dysfunction). - Aortic valve: There was severe stenosis. Mean gradient (S): 82 mm   Hg. Valve area (VTI): 0.42 cm^2. Valve area (Vmean): 0.43 cm^2. - Left atrium: The atrium was mildly dilated. - Right atrium: The atrium was mildly dilated. - Tricuspid valve: There was moderate regurgitation. - Pulmonary arteries: Systolic pressure was mildly to moderately   increased. PA peak pressure: 45 mm Hg (S).  Impressions:  - Aortic stenosis parameters have progressed compared to previous   echo 10/2015.  Patient Profile     61 y.o. 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 She is admitted for gentle IV diuresis in preparation for cardiac catheterization with Dr. Katrinka Blazing.  Assessment & Plan    1. Bicuspid aortic valve with severe aortic stenosis as outlined above. This was complicated by acute on chronic diastolic heart failure and she is responding well to gentle diuresis. - Tentatively, cardiac catheterization is scheduled with Dr. Katrinka Blazing on Tuesday. - Creatinine stable at 0.66 today - Remains hemodynamically stable  2.  Acute on chronic diastolic CHF - she has put out 3.8L yesterday and is net neg 7.5L.  Renal function is stable with creatinine 0.66.  SOB has significantly improved with diuresis and was able to lay flat this am. She still has some LE edema so will give Lasix 40mg  IV today.    3. Essential hypertension. Blood pressure borderline controlled. - continue to hold ARB pending cardiac catheterization and with current diuresis.  4. Hypothyroidism, on Synthroid. Recent Synthroid dose reduced in light of TSH 0.08.  5. History of recurrent DVT on Eliquis as an outpatient which is presently on hold in preparation for cardiac catheterization. - continue Lovenox SQ for now   Signed, Armanda Magic, MD  12/15/2016, 10:14 AM

## 2016-12-16 ENCOUNTER — Encounter (HOSPITAL_COMMUNITY): Payer: Self-pay | Admitting: Interventional Cardiology

## 2016-12-16 ENCOUNTER — Inpatient Hospital Stay (HOSPITAL_COMMUNITY)
Admission: AD | Disposition: A | Discharge status: Home / Self Care | Payer: Self-pay | Source: Ambulatory Visit | Attending: Interventional Cardiology

## 2016-12-16 ENCOUNTER — Other Ambulatory Visit: Payer: Self-pay

## 2016-12-16 ENCOUNTER — Ambulatory Visit (HOSPITAL_COMMUNITY)
Admission: RE | Admit: 2016-12-16 | Payer: BC Managed Care – PPO | Source: Ambulatory Visit | Admitting: Interventional Cardiology

## 2016-12-16 DIAGNOSIS — I5033 Acute on chronic diastolic (congestive) heart failure: Secondary | ICD-10-CM

## 2016-12-16 LAB — POCT I-STAT 3, ART BLOOD GAS (G3+)
Acid-Base Excess: 3 mmol/L — ABNORMAL HIGH (ref 0.0–2.0)
BICARBONATE: 27.7 mmol/L (ref 20.0–28.0)
O2 Saturation: 98 %
PH ART: 7.427 (ref 7.350–7.450)
TCO2: 29 mmol/L (ref 0–100)
pCO2 arterial: 42 mmHg (ref 32.0–48.0)
pO2, Arterial: 108 mmHg (ref 83.0–108.0)

## 2016-12-16 LAB — BASIC METABOLIC PANEL
ANION GAP: 7 (ref 5–15)
BUN: 13 mg/dL (ref 6–20)
CHLORIDE: 101 mmol/L (ref 101–111)
CO2: 30 mmol/L (ref 22–32)
Calcium: 9.5 mg/dL (ref 8.9–10.3)
Creatinine, Ser: 0.78 mg/dL (ref 0.44–1.00)
GFR calc Af Amer: >60 mL/min (ref >60)
GLUCOSE: 121 mg/dL — AB (ref 65–99)
POTASSIUM: 4.3 mmol/L (ref 3.5–5.1)
Sodium: 138 mmol/L (ref 135–145)

## 2016-12-16 LAB — POCT I-STAT 3, VENOUS BLOOD GAS (G3P V)
Acid-Base Excess: 2 mmol/L (ref 0.0–2.0)
Bicarbonate: 27.7 mmol/L (ref 20.0–28.0)
O2 SAT: 73 %
PCO2 VEN: 44 mmHg (ref 44.0–60.0)
TCO2: 29 mmol/L (ref 0–100)
pH, Ven: 7.408 (ref 7.250–7.430)
pO2, Ven: 39 mmHg (ref 32.0–45.0)

## 2016-12-16 SURGERY — RIGHT HEART CATH AND CORONARY ANGIOGRAPHY
Anesthesia: LOCAL

## 2016-12-16 MED ORDER — ACETAMINOPHEN 325 MG PO TABS
650.0000 mg | ORAL_TABLET | ORAL | Status: DC | PRN
  Administered 2016-12-16: 650 mg via ORAL
  Filled 2016-12-16: qty 2

## 2016-12-16 MED ORDER — HEPARIN SODIUM (PORCINE) 1000 UNIT/ML IJ SOLN
INTRAMUSCULAR | Status: DC | PRN
  Administered 2016-12-16: 6400 [IU] via INTRAVENOUS

## 2016-12-16 MED ORDER — HEPARIN (PORCINE) IN NACL 2-0.9 UNIT/ML-% IJ SOLN
INTRAMUSCULAR | Status: AC | PRN
  Administered 2016-12-16: 1000 mL via INTRA_ARTERIAL

## 2016-12-16 MED ORDER — FERROUS SULFATE 325 (65 FE) MG PO TABS
325.0000 mg | ORAL_TABLET | Freq: Two times a day (BID) | ORAL | Status: DC
  Administered 2016-12-16 – 2016-12-17 (×2): 325 mg via ORAL
  Filled 2016-12-16 (×2): qty 1

## 2016-12-16 MED ORDER — LIDOCAINE HCL (PF) 1 % IJ SOLN
INTRAMUSCULAR | Status: AC
  Filled 2016-12-16: qty 30

## 2016-12-16 MED ORDER — SODIUM CHLORIDE 0.9 % IV SOLN: 250.0000 mL | INTRAVENOUS | Status: DC | PRN

## 2016-12-16 MED ORDER — ASPIRIN EC 81 MG PO TBEC
81.0000 mg | DELAYED_RELEASE_TABLET | Freq: Every day | ORAL | Status: DC
  Filled 2016-12-16: qty 1

## 2016-12-16 MED ORDER — SODIUM CHLORIDE 0.9% FLUSH: 3.0000 mL | Freq: Two times a day (BID) | INTRAVENOUS | Status: DC

## 2016-12-16 MED ORDER — POTASSIUM CHLORIDE CRYS ER 20 MEQ PO TBCR
20.0000 meq | EXTENDED_RELEASE_TABLET | Freq: Every day | ORAL | Status: DC
  Administered 2016-12-16 – 2016-12-17 (×2): 20 meq via ORAL
  Filled 2016-12-16 (×2): qty 1

## 2016-12-16 MED ORDER — HEPARIN SODIUM (PORCINE) 1000 UNIT/ML IJ SOLN
INTRAMUSCULAR | Status: AC
  Filled 2016-12-16: qty 1

## 2016-12-16 MED ORDER — OXYCODONE-ACETAMINOPHEN 5-325 MG PO TABS: 1.0000 | ORAL_TABLET | ORAL | Status: DC | PRN

## 2016-12-16 MED ORDER — APIXABAN 5 MG PO TABS
5.0000 mg | ORAL_TABLET | Freq: Two times a day (BID) | ORAL | Status: DC
  Administered 2016-12-16 – 2016-12-17 (×2): 5 mg via ORAL
  Filled 2016-12-16 (×2): qty 1

## 2016-12-16 MED ORDER — MIDAZOLAM HCL 2 MG/2ML IJ SOLN
INTRAMUSCULAR | Status: DC | PRN
  Administered 2016-12-16 (×3): 1 mg via INTRAVENOUS

## 2016-12-16 MED ORDER — ASPIRIN 81 MG PO CHEW
81.0000 mg | CHEWABLE_TABLET | ORAL | Status: AC
  Administered 2016-12-16: 81 mg via ORAL
  Filled 2016-12-16: qty 1

## 2016-12-16 MED ORDER — VERAPAMIL HCL 2.5 MG/ML IV SOLN
INTRAVENOUS | Status: AC
  Filled 2016-12-16: qty 2

## 2016-12-16 MED ORDER — SODIUM CHLORIDE 0.9 % IV SOLN: INTRAVENOUS | Status: AC

## 2016-12-16 MED ORDER — ONDANSETRON HCL 4 MG/2ML IJ SOLN: 4.0000 mg | Freq: Four times a day (QID) | INTRAMUSCULAR | Status: DC | PRN

## 2016-12-16 MED ORDER — SODIUM CHLORIDE 0.9% FLUSH: 3.0000 mL | INTRAVENOUS | Status: DC | PRN

## 2016-12-16 MED ORDER — MIDAZOLAM HCL 2 MG/2ML IJ SOLN
INTRAMUSCULAR | Status: AC
Start: 2016-12-16 — End: 2016-12-16
  Filled 2016-12-16: qty 2

## 2016-12-16 MED ORDER — FUROSEMIDE 40 MG PO TABS
40.0000 mg | ORAL_TABLET | Freq: Every day | ORAL | Status: DC
  Administered 2016-12-17: 40 mg via ORAL
  Filled 2016-12-16: qty 1

## 2016-12-16 MED ORDER — ASPIRIN 81 MG PO CHEW
81.0000 mg | CHEWABLE_TABLET | Freq: Every day | ORAL | Status: DC
  Filled 2016-12-16: qty 1

## 2016-12-16 MED ORDER — IOPAMIDOL (ISOVUE-370) INJECTION 76%
INTRAVENOUS | Status: DC | PRN
  Administered 2016-12-16: 50 mL via INTRA_ARTERIAL

## 2016-12-16 MED ORDER — MIDAZOLAM HCL 2 MG/2ML IJ SOLN
INTRAMUSCULAR | Status: AC
  Filled 2016-12-16: qty 2

## 2016-12-16 MED ORDER — IOPAMIDOL (ISOVUE-370) INJECTION 76%
INTRAVENOUS | Status: AC
  Filled 2016-12-16: qty 100

## 2016-12-16 MED ORDER — HEPARIN (PORCINE) IN NACL 2-0.9 UNIT/ML-% IJ SOLN
INTRAMUSCULAR | Status: AC
  Filled 2016-12-16: qty 1000

## 2016-12-16 MED ORDER — VERAPAMIL HCL 2.5 MG/ML IV SOLN
INTRAVENOUS | Status: DC | PRN
  Administered 2016-12-16: 10 mL via INTRA_ARTERIAL

## 2016-12-16 MED ORDER — ASPIRIN 81 MG PO CHEW: 81.0000 mg | CHEWABLE_TABLET | ORAL | Status: DC

## 2016-12-16 MED ORDER — FENTANYL CITRATE (PF) 100 MCG/2ML IJ SOLN
INTRAMUSCULAR | Status: AC
  Filled 2016-12-16: qty 2

## 2016-12-16 MED ORDER — FENTANYL CITRATE (PF) 100 MCG/2ML IJ SOLN
INTRAMUSCULAR | Status: DC | PRN
  Administered 2016-12-16 (×2): 50 ug via INTRAVENOUS

## 2016-12-16 MED ORDER — LIDOCAINE HCL (PF) 1 % IJ SOLN
INTRAMUSCULAR | Status: DC | PRN
  Administered 2016-12-16: 5 mL via INTRADERMAL

## 2016-12-16 SURGICAL SUPPLY — 17 items
CATH BALLN WEDGE 5F 110CM (CATHETERS) ×1 IMPLANT
CATH INFINITI 5 FR JL3.5 (CATHETERS) ×1 IMPLANT
CATH INFINITI JR4 5F (CATHETERS) ×1 IMPLANT
COVER PRB 48X5XTLSCP FOLD TPE (BAG) IMPLANT
COVER PROBE 5X48 (BAG) ×2
DEVICE RAD COMP TR BAND LRG (VASCULAR PRODUCTS) ×1 IMPLANT
GLIDESHEATH SLEND A-KIT 6F 22G (SHEATH) ×1 IMPLANT
GUIDEWIRE .025 260CM (WIRE) ×1 IMPLANT
GUIDEWIRE INQWIRE 1.5J.035X260 (WIRE) IMPLANT
HOVERMATT SINGLE USE (MISCELLANEOUS) ×1 IMPLANT
INQWIRE 1.5J .035X260CM (WIRE) ×2
KIT HEART LEFT (KITS) ×2 IMPLANT
PACK CARDIAC CATHETERIZATION (CUSTOM PROCEDURE TRAY) ×2 IMPLANT
SHEATH GLIDE SLENDER 4/5FR (SHEATH) ×2 IMPLANT
TRANSDUCER W/STOPCOCK (MISCELLANEOUS) ×2 IMPLANT
TUBING CIL FLEX 10 FLL-RA (TUBING) ×2 IMPLANT
WIRE EMERALD ST .035X150CM (WIRE) ×1 IMPLANT

## 2016-12-16 NOTE — Care Management Note (Signed)
Case Management Note  Patient Details  Name: Christine Hicks MRN: [id number] Date of Birth: 11/17/1955  Subjective/Objective:                 Spoke with patient and family at the bedside. She states that she lives at home with husband and son. Patient states she has used [facility] in the past, she states she is walking the halls with RW and does not feel like she will need HH. She has RW and cane at home. She was on Eliquis pta. Patient states she and her spouse drive, she declines difficulties obtaining or paying for medication.  [facility]   Action/Plan:   Expected Discharge Date:  12/17/16               Expected Discharge Plan:  Home/Self Care  In-House Referral:     Discharge planning Services  CM Consult  Post Acute Care Choice:    Choice offered to:     DME Arranged:    DME Agency:     HH Arranged:    HH Agency:     Status of Service:  In process, will continue to follow  If discussed at Long Length of Stay Meetings, dates discussed:    Additional Comments:  Lawerance SabalDebbie Orlene Salmons, RN 12/16/2016, 3:05 PM

## 2016-12-16 NOTE — Care Management Important Message (Signed)
Important Message  Patient Details  Name: Christine Hicks MRN: [id number] Date of Birth: 12/11/1955   Medicare Important Message Given:  Yes    [name] 12/16/2016, 11:38 AM

## 2016-12-16 NOTE — Progress Notes (Addendum)
Progress Note  Patient Name: Christine Hicks Date of Encounter: 12/16/2016  Primary Cardiologist: Dr. Verdis Prime  Subjective   No chest pain and SOB significantly improved  Inpatient Medications    Scheduled Meds: . [START ON 12/17/2016] aspirin EC  81 mg Oral Daily  . enoxaparin (LOVENOX) injection  40 mg Subcutaneous Q24H  . levothyroxine  150 mcg Oral QAC breakfast  . sodium chloride flush  3 mL Intravenous Q12H  . sodium chloride flush  3 mL Intravenous Q12H   Continuous Infusions: . sodium chloride    . sodium chloride    . sodium chloride 10 mL/hr at 12/16/16 0649   PRN Meds: sodium chloride, sodium chloride, acetaminophen, ondansetron (ZOFRAN) IV, sodium chloride flush, sodium chloride flush, zolpidem   Vital Signs    Vitals:   12/15/16 1138 12/15/16 2204 12/16/16 0346 12/16/16 0500  BP: 133/80 135/61  (!) 147/86  Pulse: 70 73  61  Resp: 18 18  18   Temp: 98.4 F (36.9 C) 98.5 F (36.9 C)  98 F (36.7 C)  TempSrc: Oral Oral  Oral  SpO2: 98% 95%  98%  Weight:   276 lb 3.2 oz (125.3 kg)   Height:        Intake/Output Summary (Last 24 hours) at 12/16/16 0839 Last data filed at 12/16/16 0352  Gross per 24 hour  Intake             1550 ml  Output             4225 ml  Net            -2675 ml   Filed Weights   12/14/16 0430 12/15/16 0625 12/16/16 0346  Weight: 277 lb 3.2 oz (125.7 kg) 267 lb 12.8 oz (121.5 kg) 276 lb 3.2 oz (125.3 kg)    Telemetry    NSR - I Personally reviewed.  ECG    Tracing from 12/15/2016 shows sinus rhythm with LAE and right bundle branch block.  -Personally reviewed.  Physical Exam   GEN: WD, WN in NAD Currently on cath lab table so unable to perform exam  Labs    Chemistry  Recent Labs Lab 12/11/16 1434  12/15/16 0001 12/15/16 0453 12/16/16 0519  NA 140  < > 135 136 138  K 4.1  < > 4.0 3.6 4.3  CL 108  < > 102 102 101  CO2 25  < > 25 25 30   GLUCOSE 118*  < > 111* 106* 121*  BUN 8  < > 15 13 13   CREATININE  0.83  < > 0.72 0.66 0.78  CALCIUM 9.4  < > 9.1 8.9 9.5  PROT 7.5  --   --   --   --   ALBUMIN 3.7  --   --   --   --   AST 38  --   --   --   --   ALT 26  --   --   --   --   ALKPHOS 78  --   --   --   --   BILITOT 1.0  --   --   --   --   GFRNONAA >60  < > >60 >60 >60  GFRAA >60  < > >60 >60 >60  ANIONGAP 7  < > 8 9 7   < > = values in this interval not displayed.   Hematology  Recent Labs Lab 12/11/16 1434 12/14/16 0355 12/15/16 0453  WBC  7.0 7.0 6.5  RBC 4.83 4.85 4.63  HGB 15.3* 15.5* 14.5  HCT 47.7* 46.8* 44.8  MCV 98.8 96.5 96.8  MCH 31.7 32.0 31.3  MCHC 32.1 33.1 32.4  RDW 14.0 13.5 13.5  PLT 168 128* 156    Cardiac Enzymes  Recent Labs Lab 12/11/16 1434  TROPONINI <0.03   No results for input(s): TROPIPOC in the last 168 hours.   BNP  Recent Labs Lab 12/11/16 1434  BNP 81.6     Cardiac Studies   Echocardiogram 11/20/2016: Study Conclusions  - Left ventricle: The cavity size was normal. Wall thickness was   increased in a pattern of moderate LVH. Systolic function was   normal. The estimated ejection fraction was in the range of 55%   to 60%. Wall motion was normal; there were no regional wall   motion abnormalities. Doppler parameters are consistent with   abnormal left ventricular relaxation (grade 1 diastolic   dysfunction). - Aortic valve: There was severe stenosis. Mean gradient (S): 82 mm   Hg. Valve area (VTI): 0.42 cm^2. Valve area (Vmean): 0.43 cm^2. - Left atrium: The atrium was mildly dilated. - Right atrium: The atrium was mildly dilated. - Tricuspid valve: There was moderate regurgitation. - Pulmonary arteries: Systolic pressure was mildly to moderately   increased. PA peak pressure: 45 mm Hg (S).  Impressions:  - Aortic stenosis parameters have progressed compared to previous   echo 10/2015.  Patient Profile     61 y.o. female with bicuspid aortic valve and progressive aortic stenosis, hypertension, history of recurrent  DVT on Eliquis as an outpatient, now admitted with progressive symptoms including shortness of breath and orthopnea in the setting of volume overload. She is admitted for gentle IV diuresis in preparation for cardiac catheterization with Dr. Katrinka BlazingSmith.  Assessment & Plan    1. Bicuspid aortic valve with severe aortic stenosis with mean AV gradient 82mmHg by echo.  - Plan for cardiac catheterization is scheduled with Dr. Katrinka BlazingSmith today. - Creatinine stable at 0.78 today - Remains hemodynamically stable  2.  Acute on chronic diastolic CHF - she has put out 4.4L yesterday and is net neg 10.6L.  Renal function is stable with creatinine 0.78.  SOB has significantly improved with diuresis and now able to lay flat. Will hold Lasix this am and base further diuresis on LVEDP at the time of cath.  3. Essential hypertension. Blood pressure control improved. - continue to hold ARB pending cardiac catheterization and with current diuresis.  4. Hypothyroidism, on Synthroid. Recent Synthroid dose reduced in light of TSH 0.08.  5. History of recurrent DVT on Eliquis as an outpatient which is presently on hold in preparation for cardiac catheterization. - continue Lovenox SQ for now   Signed, Armanda Magicraci Mataya Kilduff, MD  12/16/2016, 8:39 AM

## 2016-12-16 NOTE — Progress Notes (Signed)
RN notified by CCMD that pt. Had 1.52 sec pause. Pt. Alert and asymptomatic. No s/s of distress or discomfort noted. RN will continue to monitor pt. For changes in condition. Dezra Mandella, Cheryll DessertKaren Cherrell

## 2016-12-16 NOTE — H&P (View-Only) (Signed)
Progress Note  Patient Name: Christine Hicks Date of Encounter: 12/16/2016  Primary Cardiologist: Dr. Verdis Prime  Subjective   No chest pain and SOB significantly improved  Inpatient Medications    Scheduled Meds: . [START ON 12/17/2016] aspirin EC  81 mg Oral Daily  . enoxaparin (LOVENOX) injection  40 mg Subcutaneous Q24H  . levothyroxine  150 mcg Oral QAC breakfast  . sodium chloride flush  3 mL Intravenous Q12H  . sodium chloride flush  3 mL Intravenous Q12H   Continuous Infusions: . sodium chloride    . sodium chloride    . sodium chloride 10 mL/hr at 12/16/16 0649   PRN Meds: sodium chloride, sodium chloride, acetaminophen, ondansetron (ZOFRAN) IV, sodium chloride flush, sodium chloride flush, zolpidem   Vital Signs    Vitals:   12/15/16 1138 12/15/16 2204 12/16/16 0346 12/16/16 0500  BP: 133/80 135/61  (!) 147/86  Pulse: 70 73  61  Resp: 18 18  18   Temp: 98.4 F (36.9 C) 98.5 F (36.9 C)  98 F (36.7 C)  TempSrc: Oral Oral  Oral  SpO2: 98% 95%  98%  Weight:   276 lb 3.2 oz (125.3 kg)   Height:        Intake/Output Summary (Last 24 hours) at 12/16/16 0839 Last data filed at 12/16/16 0352  Gross per 24 hour  Intake             1550 ml  Output             4225 ml  Net            -2675 ml   Filed Weights   12/14/16 0430 12/15/16 0625 12/16/16 0346  Weight: 277 lb 3.2 oz (125.7 kg) 267 lb 12.8 oz (121.5 kg) 276 lb 3.2 oz (125.3 kg)    Telemetry    NSR - I Personally reviewed.  ECG    Tracing from 12/15/2016 shows sinus rhythm with LAE and right bundle branch block.  -Personally reviewed.  Physical Exam   GEN: WD, WN in NAD Currently on cath lab table so unable to perform exam  Labs    Chemistry  Recent Labs Lab 12/11/16 1434  12/15/16 0001 12/15/16 0453 12/16/16 0519  NA 140  < > 135 136 138  K 4.1  < > 4.0 3.6 4.3  CL 108  < > 102 102 101  CO2 25  < > 25 25 30   GLUCOSE 118*  < > 111* 106* 121*  BUN 8  < > 15 13 13   CREATININE  0.83  < > 0.72 0.66 0.78  CALCIUM 9.4  < > 9.1 8.9 9.5  PROT 7.5  --   --   --   --   ALBUMIN 3.7  --   --   --   --   AST 38  --   --   --   --   ALT 26  --   --   --   --   ALKPHOS 78  --   --   --   --   BILITOT 1.0  --   --   --   --   GFRNONAA >60  < > >60 >60 >60  GFRAA >60  < > >60 >60 >60  ANIONGAP 7  < > 8 9 7   < > = values in this interval not displayed.   Hematology  Recent Labs Lab 12/11/16 1434 12/14/16 0355 12/15/16 0453  WBC  7.0 7.0 6.5  RBC 4.83 4.85 4.63  HGB 15.3* 15.5* 14.5  HCT 47.7* 46.8* 44.8  MCV 98.8 96.5 96.8  MCH 31.7 32.0 31.3  MCHC 32.1 33.1 32.4  RDW 14.0 13.5 13.5  PLT 168 128* 156    Cardiac Enzymes  Recent Labs Lab 12/11/16 1434  TROPONINI <0.03   No results for input(s): TROPIPOC in the last 168 hours.   BNP  Recent Labs Lab 12/11/16 1434  BNP 81.6     Cardiac Studies   Echocardiogram 11/20/2016: Study Conclusions  - Left ventricle: The cavity size was normal. Wall thickness was   increased in a pattern of moderate LVH. Systolic function was   normal. The estimated ejection fraction was in the range of 55%   to 60%. Wall motion was normal; there were no regional wall   motion abnormalities. Doppler parameters are consistent with   abnormal left ventricular relaxation (grade 1 diastolic   dysfunction). - Aortic valve: There was severe stenosis. Mean gradient (S): 82 mm   Hg. Valve area (VTI): 0.42 cm^2. Valve area (Vmean): 0.43 cm^2. - Left atrium: The atrium was mildly dilated. - Right atrium: The atrium was mildly dilated. - Tricuspid valve: There was moderate regurgitation. - Pulmonary arteries: Systolic pressure was mildly to moderately   increased. PA peak pressure: 45 mm Hg (S).  Impressions:  - Aortic stenosis parameters have progressed compared to previous   echo 10/2015.  Patient Profile     61 y.o. female with bicuspid aortic valve and progressive aortic stenosis, hypertension, history of recurrent  DVT on Eliquis as an outpatient, now admitted with progressive symptoms including shortness of breath and orthopnea in the setting of volume overload. She is admitted for gentle IV diuresis in preparation for cardiac catheterization with Dr. Katrinka BlazingSmith.  Assessment & Plan    1. Bicuspid aortic valve with severe aortic stenosis with mean AV gradient 82mmHg by echo.  - Plan for cardiac catheterization is scheduled with Dr. Katrinka BlazingSmith today. - Creatinine stable at 0.78 today - Remains hemodynamically stable  2.  Acute on chronic diastolic CHF - she has put out 4.4L yesterday and is net neg 10.6L.  Renal function is stable with creatinine 0.78.  SOB has significantly improved with diuresis and now able to lay flat. Will hold Lasix this am and base further diuresis on LVEDP at the time of cath.  3. Essential hypertension. Blood pressure control improved. - continue to hold ARB pending cardiac catheterization and with current diuresis.  4. Hypothyroidism, on Synthroid. Recent Synthroid dose reduced in light of TSH 0.08.  5. History of recurrent DVT on Eliquis as an outpatient which is presently on hold in preparation for cardiac catheterization. - continue Lovenox SQ for now   Signed, Armanda Magicraci Madison Direnzo, MD  12/16/2016, 8:39 AM

## 2016-12-16 NOTE — Care Management Important Message (Signed)
Important Message  Patient Details  Name: Christine PattenJudith M Allan MRN: [id number] Date of Birth: 11/28/1955   Medicare Important Message Given:  Yes    Isom Kochan Stefan ChurchBratton 12/16/2016, 9:41 AM

## 2016-12-16 NOTE — Interval H&P Note (Signed)
Cath Lab Visit (complete for each Cath Lab visit)  Clinical Evaluation Leading to the Procedure:   ACS: No.  Non-ACS:    Anginal Classification: CCS IV  Anti-ischemic medical therapy: Minimal Therapy (1 class of medications)  Non-Invasive Test Results: No non-invasive testing performed  Prior CABG: No previous CABG      History and Physical Interval Note:  12/16/2016 9:14 AM  Christine Hicks  has presented today for surgery, with the diagnosis of aortic stenosis  The various methods of treatment have been discussed with the patient and family. After consideration of risks, benefits and other options for treatment, the patient has consented to  Procedure(s): Right/Left Heart Cath and Coronary Angiography (N/A) as a surgical intervention .  The patient's history has been reviewed, patient examined, no change in status, stable for surgery.  I have reviewed the patient's chart and labs.  Questions were answered to the patient's satisfaction.     Christine Hicks

## 2016-12-17 ENCOUNTER — Other Ambulatory Visit: Payer: Self-pay

## 2016-12-17 LAB — BASIC METABOLIC PANEL
ANION GAP: 8 (ref 5–15)
BUN: 12 mg/dL (ref 6–20)
CHLORIDE: 105 mmol/L (ref 101–111)
CO2: 26 mmol/L (ref 22–32)
CREATININE: 0.76 mg/dL (ref 0.44–1.00)
Calcium: 9.4 mg/dL (ref 8.9–10.3)
GFR calc non Af Amer: >60 mL/min (ref >60)
Glucose, Bld: 99 mg/dL (ref 65–99)
POTASSIUM: 4.1 mmol/L (ref 3.5–5.1)
SODIUM: 139 mmol/L (ref 135–145)

## 2016-12-17 MED ORDER — LEVOTHYROXINE SODIUM 150 MCG PO TABS: 150.0000 ug | ORAL_TABLET | Freq: Every day | ORAL | 2 refills | Status: DC

## 2016-12-17 MED FILL — Lidocaine HCl Local Preservative Free (PF) Inj 1%: INTRAMUSCULAR | Qty: 30 | Status: AC

## 2016-12-17 NOTE — Discharge Summary (Signed)
Discharge Summary    Patient ID: Christine Hicks,  MRN: [id number], DOB/AGE: Dec 02, 1955 61 y.o.  Admit date: 12/11/2016 Discharge date: 12/17/2016  Primary Care Provider: Billee Cashing Primary Cardiologist: Dr Katrinka Blazing  Discharge Diagnoses    Principal Problem:   Aortic stenosis, severe Active Problems:   Essential hypertension   DVT, recurrent, lower extremity, chronic, bilateral (HCC)   Bicuspid aortic valve   Acute on chronic diastolic heart failure (HCC)   Critical aortic valve stenosis   Acute on chronic diastolic congestive heart failure (HCC)   Allergies No Known Allergies  Diagnostic Studies/Procedures    Right heart catheterization on 12/16/2016 Conclusion   Critical aortic stenosis, on a bicuspid aortic valve with documented greater than 120 mm peak to peak and 85 mm mean gradient by recent echo.  Normal coronary arteries.  Normal pulmonary pressures. The capillary wedge pressure is 14 mmHg mean.   Acute on chronic diastolic heart failure, resolved after inpatient diuresis over the past 4 days.  RECOMMENDATIONS:  Heart valve team workup followed by TAVR (preferrable) vs SAVR.  Resolution of heart failure has occurred and recommend starting furosemide 40 mg by mouth daily.  Home in a.m. if no postprocedure complications   Hemo Data   Most Recent Value  Fick Cardiac Output 6.12 L/min  Fick Cardiac Output Index 2.7 (L/min)/BSA  RA A Wave 3 mmHg  RA V Wave 3 mmHg  RA Mean 2 mmHg  RV Systolic Pressure 34 mmHg  RV Diastolic Pressure 2 mmHg  RV EDP 3 mmHg  PA Systolic Pressure 33 mmHg  PA Diastolic Pressure 14 mmHg  PA Mean 22 mmHg  PW A Wave 16 mmHg  PW V Wave 0 mmHg  PW Mean 14 mmHg  AO Systolic Pressure 143 mmHg  AO Diastolic Pressure 76 mmHg  AO Mean 100 mmHg  QP/QS 1  TPVR Index 8.15 HRUI  TSVR Index 37.06 HRUI  PVR SVR Ratio 0.08  TPVR/TSVR Ratio 0.22   ___________   History of Present Illness     Christine Hicks is a 61 y.o.  female with history of bicuspid aortic valve, progressive aortic stenosis, and hypertension.  She is an obese 61 year old female with congenitally bicuspid aortic valve and a several year history of critical aortic stenosis for which she has refused therapy. Recently she has begun experiencing orthopnea and palpitations with recumbency. Recent echocardiogram demonstrated progression of aortic stenosis now with a peak velocity 5.97 m/s, peak gradient 143 mmHg (compared with 4.8 m/s and 92 mmHg one year ago). She denies chest pain. She has not had syncope. She is having PND and palpitations that cause her to set up at night.  She refused workup last year. She skipped a six-month follow-up that we had scheduled 1 year ago. Developing symptoms caused her to reestablish contact with [place] and recent echo demonstrated significant progression.  Hospital Course     Consultants: None  The patient was gently diuresed with lasix 40 mg IV BID with net negative 11L I&O. Modest reduction in weight of 5-7 lbs. The patient's dyspnea is totally resolved and she is now able to lay flat. Her edema has improved. Her blood pressure was elevated in the 140-160 range with improvement yesterday in the 120's. Her ARB was held in order to avoid hypotension with diuresis. With only mildly elevated BP in setting of critical AS, will continue to hold her Lotensin and follow up in the office.   Kidney function has remained stable with  current SCr 0.76, K+ 4.1.  The patient's TSH was low at 0.08 and synthroid dose was decreased.  The patient has a history of DVT on Eliquis which was held prior to cath. Eliquis has been restarted.  Appointment made to have Dr. Excell Seltzerooper assess pt for possible TAVR. She will also follow up with Dr. Katrinka BlazingSmith.   Patient has been seen by Dr. Mayford Knifeurner today and deemed ready for discharge home. All follow up appointments have been scheduled. Discharge medications are listed below.  _____________  Discharge  Vitals Blood pressure (!) 160/62, pulse 70, temperature 98.4 F (36.9 C), temperature source Oral, resp. rate 20, height 5\' 5"  (1.651 m), weight 278 lb (126.1 kg), SpO2 96 %.  Filed Weights   12/15/16 0625 12/16/16 0346 12/17/16 0442  Weight: 267 lb 12.8 oz (121.5 kg) 276 lb 3.2 oz (125.3 kg) 278 lb (126.1 kg)    Labs & Radiologic Studies    CBC  Recent Labs  12/15/16 0453  WBC 6.5  HGB 14.5  HCT 44.8  MCV 96.8  PLT 156   Basic Metabolic Panel  Recent Labs  12/16/16 0519 12/17/16 1120  NA 138 139  K 4.3 4.1  CL 101 105  CO2 30 26  GLUCOSE 121* 99  BUN 13 12  CREATININE 0.78 0.76  CALCIUM 9.5 9.4  _____________  Dg Chest 2 View  Result Date: 12/12/2016 CLINICAL DATA:  History of aortic valve disease.  Evaluate for CHF. EXAM: CHEST  2 VIEW COMPARISON:  11/12/2015 FINDINGS: Chronic cardiomegaly. Stable aortic and hilar contours. There is interstitial prominence with occasional Kerley line seen bilaterally. Trace pleural effusions. IMPRESSION: 1. Trace pleural effusions and possible early interstitial edema. 2. Cardiomegaly. Electronically Signed   By: Marnee SpringJonathon  Watts M.D.   On: 12/12/2016 09:28   Disposition   Pt is being discharged home today in good condition.  Follow-up Plans & Appointments    Follow-up Information    Leone Brandngold, Laura R, NP Follow up on 12/26/2016.   Specialties:  Cardiology, Radiology Why:  at 10:30 for cardiology hospital follow up.  Contact information: [address] [place] [address] [phone]        Kathleene HazelMcAlhany, Christopher D, MD Follow up on 12/31/2016.   Specialty:  Cardiology Why:  at 2:00 for evaluation for valve replacement.  Contact information: [address] [place] [address] [phone]          Discharge Instructions    (HEART FAILURE PATIENTS) Call MD:  Anytime you have any of the following symptoms: 1) 3 pound weight gain in 24 hours or 5 pounds in 1 week 2) shortness of breath, with or  without a dry hacking cough 3) swelling in the hands, feet or stomach 4) if you have to sleep on extra pillows at night in order to breathe.    Complete by:  As directed    Avoid straining    Complete by:  As directed    Diet - low sodium heart healthy    Complete by:  As directed    Discharge instructions    Complete by:  As directed    PLEASE REMEMBER TO BRING ALL OF YOUR MEDICATIONS TO EACH OF YOUR FOLLOW-UP OFFICE VISITS.  PLEASE ATTEND ALL SCHEDULED FOLLOW-UP APPOINTMENTS.   Activity: Increase activity slowly as tolerated. You may shower, but no soaking baths (or swimming) for 1 week. No driving for 24 hours. No lifting over 5 lbs for 1 week. No sexual activity  for 1 week.   Wound Care: You may wash cath site gently with soap and water. Keep cath site clean and dry. If you notice pain, swelling, bleeding or pus at your cath site, please call [phone].   Heart Failure patients record your daily weight using the same scale at the same time of day    Complete by:  As directed    Increase activity slowly    Complete by:  As directed    STOP any activity that causes chest pain, shortness of breath, dizziness, sweating, or exessive weakness    Complete by:  As directed       Discharge Medications   Current Discharge Medication List    CONTINUE these medications which have CHANGED   Details  levothyroxine (SYNTHROID, LEVOTHROID) 150 MCG tablet Take 1 tablet (150 mcg total) by mouth daily before breakfast. Qty: 30 tablet, Refills: 2      CONTINUE these medications which have NOT CHANGED   Details  apixaban (ELIQUIS) 5 MG TABS tablet Take 1 tablet (5 mg total) by mouth 2 (two) times daily. Qty: 180 tablet, Refills: 3    ferrous sulfate 325 (65 FE) MG tablet Take 1 tablet (325 mg total) by mouth 2 (two) times daily with a meal. Qty: 60 tablet, Refills: 0    furosemide (LASIX) 40 MG tablet Take 1 tablet (40 mg total) by mouth daily. Qty: 90 tablet, Refills: 3    guaiFENesin  (MUCINEX) 600 MG 12 hr tablet Take 600 mg by mouth 2 (two) times daily as needed for cough or to loosen phlegm.    OVER THE COUNTER MEDICATION Take 2 tablets by mouth as needed (for sinuses). OTC Equate Sinus Relief    pantoprazole (PROTONIX) 40 MG tablet TAKE 1 TABLET (40 MG TOTAL) BY MOUTH DAILY. Qty: 30 tablet, Refills: 6    potassium chloride (KLOR-CON M10) 10 MEQ tablet Take 1 tablet (10 mEq total) by mouth daily. Qty: 90 tablet, Refills: 3    traMADol (ULTRAM) 50 MG tablet Take 50-100 mg by mouth 3 (three) times daily as needed for moderate pain.    zolpidem (AMBIEN) 5 MG tablet Take 5 mg by mouth at bedtime as needed for sleep.         Outstanding Labs/Studies   None  Duration of Discharge Encounter   Greater than 30 minutes including physician time.  Signed, Berton Bon NP 12/17/2016, 12:44 PM

## 2016-12-17 NOTE — Progress Notes (Signed)
Progress Note  Patient Name: Christine Hicks Date of Encounter: 12/17/2016  Primary Cardiologist: Dr. Verdis Prime  Subjective   No chest pain and SOB resolved.   Inpatient Medications    Scheduled Meds: . apixaban  5 mg Oral BID  . aspirin  81 mg Oral Daily  . aspirin EC  81 mg Oral Daily  . ferrous sulfate  325 mg Oral BID WC  . furosemide  40 mg Oral Daily  . levothyroxine  150 mcg Oral QAC breakfast  . potassium chloride  20 mEq Oral Daily  . sodium chloride flush  3 mL Intravenous Q12H  . sodium chloride flush  3 mL Intravenous Q12H   Continuous Infusions: . sodium chloride    . sodium chloride     PRN Meds: sodium chloride, sodium chloride, acetaminophen, ondansetron (ZOFRAN) IV, oxyCODONE-acetaminophen, sodium chloride flush, sodium chloride flush, zolpidem   Vital Signs    Vitals:   12/16/16 1320 12/16/16 2127 12/17/16 0442 12/17/16 0617  BP: (!) 130/56 (!) 117/52  (!) 160/62  Pulse: 82 74  70  Resp:  20  20  Temp:  98.6 F (37 C)  98.4 F (36.9 C)  TempSrc:  Oral  Oral  SpO2:  94%  96%  Weight:   278 lb (126.1 kg)   Height:        Intake/Output Summary (Last 24 hours) at 12/17/16 1000 Last data filed at 12/17/16 0900  Gross per 24 hour  Intake             1335 ml  Output             1800 ml  Net             -465 ml   Filed Weights   12/15/16 0625 12/16/16 0346 12/17/16 0442  Weight: 267 lb 12.8 oz (121.5 kg) 276 lb 3.2 oz (125.3 kg) 278 lb (126.1 kg)    Telemetry   NSR - I Personally reviewed.  ECG    Tracing from 12/15/2016 shows sinus rhythm with LAE and right bundle branch block.   12/17/16- NSR 62 bpm, RBBB, no significant change- Personally reviewed  Physical Exam   Physical Exam  Constitutional: She is oriented to person, place, and time. She appears well-developed and well-nourished. No distress.  HENT:  Head: Normocephalic.  Neck: Normal range of motion. No JVD present.  Cardiovascular: Normal rate and regular rhythm.     Murmur heard. 3/6 late peaking systolic murmur RUSB  Pulmonary/Chest: Effort normal and breath sounds normal.  Abdominal: Soft. Bowel sounds are normal. She exhibits no distension.  Musculoskeletal: Normal range of motion. She exhibits no edema.  Neurological: She is alert and oriented to person, place, and time.  Skin: Skin is warm and dry.  Psychiatric: She has a normal mood and affect.    Labs    Chemistry  Recent Labs Lab 12/11/16 1434  12/15/16 0001 12/15/16 0453 12/16/16 0519  NA 140  < > 135 136 138  K 4.1  < > 4.0 3.6 4.3  CL 108  < > 102 102 101  CO2 25  < > 25 25 30   GLUCOSE 118*  < > 111* 106* 121*  BUN 8  < > 15 13 13   CREATININE 0.83  < > 0.72 0.66 0.78  CALCIUM 9.4  < > 9.1 8.9 9.5  PROT 7.5  --   --   --   --   ALBUMIN 3.7  --   --   --   --  AST 38  --   --   --   --   ALT 26  --   --   --   --   ALKPHOS 78  --   --   --   --   BILITOT 1.0  --   --   --   --   GFRNONAA >60  < > >60 >60 >60  GFRAA >60  < > >60 >60 >60  ANIONGAP 7  < > 8 9 7   < > = values in this interval not displayed.   Hematology  Recent Labs Lab 12/11/16 1434 12/14/16 0355 12/15/16 0453  WBC 7.0 7.0 6.5  RBC 4.83 4.85 4.63  HGB 15.3* 15.5* 14.5  HCT 47.7* 46.8* 44.8  MCV 98.8 96.5 96.8  MCH 31.7 32.0 31.3  MCHC 32.1 33.1 32.4  RDW 14.0 13.5 13.5  PLT 168 128* 156    Cardiac Enzymes  Recent Labs Lab 12/11/16 1434  TROPONINI <0.03   No results for input(s): TROPIPOC in the last 168 hours.   BNP  Recent Labs Lab 12/11/16 1434  BNP 81.6     Cardiac Studies   Echocardiogram 11/20/2016: Study Conclusions  - Left ventricle: The cavity size was normal. Wall thickness was   increased in a pattern of moderate LVH. Systolic function was   normal. The estimated ejection fraction was in the range of 55%   to 60%. Wall motion was normal; there were no regional wall   motion abnormalities. Doppler parameters are consistent with   abnormal left ventricular  relaxation (grade 1 diastolic   dysfunction). - Aortic valve: There was severe stenosis. Mean gradient (S): 82 mm   Hg. Valve area (VTI): 0.42 cm^2. Valve area (Vmean): 0.43 cm^2. - Left atrium: The atrium was mildly dilated. - Right atrium: The atrium was mildly dilated. - Tricuspid valve: There was moderate regurgitation. - Pulmonary arteries: Systolic pressure was mildly to moderately   increased. PA peak pressure: 45 mm Hg (S).  Impressions:  - Aortic stenosis parameters have progressed compared to previous   echo 10/2015.  Patient Profile     61 y.o. female with bicuspid aortic valve and progressive aortic stenosis, hypertension, history of recurrent DVT on Eliquis as an outpatient, now admitted with progressive symptoms including shortness of breath and orthopnea in the setting of volume overload. She is admitted for gentle IV diuresis in preparation for cardiac catheterization with Dr. Katrinka Blazing.  Assessment & Plan    1. Bicuspid aortic valve with severe aortic stenosis  - Cardiac catheterization done by Dr. Katrinka Blazing yesterday showed critical aortic stenosis, on a bicuspid aortic valve with documented greater thatn 120 mm peak and 85 mm mean gradient by recnet echo. Normal coronaries. Normal pulmonary pressures and capillary wedge pressure of 14 mm Hg mean. Recommendation of valve team workup preferably for TAVR vs surgical AVR.  - Will recheck SCr prior to discharge.  - Remains hemodynamically stable  2.  Acute on chronic diastolic CHF  -She has been gently diuresed with lasix 40 mg IV BID then daily. -she is net neg 11L.   -Renal function has been stable. Will recheck BMet today, post cath.  -SOB has significantly improved with diuresis and now able to lay flat.  -Plans to discharge home on 40 mg lasix daily  3. Essential hypertension.  -Blood pressure has been stable, avoiding hypotension -ARB was held with diuresis and preparation for cath. Will continue to hold for now. Pt  can check BP  at home and notify if running very high.   4. Hypothyroidism  -on Synthroid. Recent Synthroid dose reduced in light of TSH 0.08.  5. History of recurrent DVT  -on Eliquis as an outpatient which was put on hold in preparation for cardiac catheterization. -On Lovenox SQ while here, Eliquis restarted. No aspirin.    Signed, Berton BonJanine Nimesh Riolo, NP  12/17/2016, 10:00 AM

## 2016-12-17 NOTE — Progress Notes (Signed)
Pt has orders to be discharged. Discharge instructions given and pt has no additional questions at this time. Medication regimen reviewed and pt educated. Pt verbalized understanding and has no additional questions. Telemetry box removed. IV removed and site in good condition. Pt stable and waiting for transportation.  Danyell Shader RN 

## 2016-12-18 ENCOUNTER — Other Ambulatory Visit: Payer: Self-pay | Admitting: Interventional Cardiology

## 2016-12-18 ENCOUNTER — Other Ambulatory Visit: Payer: Self-pay | Admitting: *Deleted

## 2016-12-18 DIAGNOSIS — I350 Nonrheumatic aortic (valve) stenosis: Secondary | ICD-10-CM

## 2016-12-18 DIAGNOSIS — I712 Thoracic aortic aneurysm, without rupture, unspecified: Secondary | ICD-10-CM

## 2016-12-18 DIAGNOSIS — Z01818 Encounter for other preprocedural examination: Secondary | ICD-10-CM

## 2016-12-18 DIAGNOSIS — I7409 Other arterial embolism and thrombosis of abdominal aorta: Secondary | ICD-10-CM

## 2016-12-25 ENCOUNTER — Ambulatory Visit (HOSPITAL_COMMUNITY)
Admission: RE | Admit: 2016-12-25 | Discharge: 2016-12-25 | Disposition: A | Discharge status: Home / Self Care | Payer: BC Managed Care – PPO | Source: Ambulatory Visit | Attending: Thoracic Surgery (Cardiothoracic Vascular Surgery) | Admitting: Thoracic Surgery (Cardiothoracic Vascular Surgery)

## 2016-12-25 DIAGNOSIS — I712 Thoracic aortic aneurysm, without rupture, unspecified: Secondary | ICD-10-CM

## 2016-12-25 DIAGNOSIS — Z01818 Encounter for other preprocedural examination: Secondary | ICD-10-CM

## 2016-12-25 DIAGNOSIS — I350 Nonrheumatic aortic (valve) stenosis: Secondary | ICD-10-CM

## 2016-12-25 DIAGNOSIS — K439 Ventral hernia without obstruction or gangrene: Secondary | ICD-10-CM | POA: Insufficient documentation

## 2016-12-25 DIAGNOSIS — I7409 Other arterial embolism and thrombosis of abdominal aorta: Secondary | ICD-10-CM

## 2016-12-25 DIAGNOSIS — R161 Splenomegaly, not elsewhere classified: Secondary | ICD-10-CM | POA: Insufficient documentation

## 2016-12-25 DIAGNOSIS — I700 Atherosclerosis of aorta: Secondary | ICD-10-CM | POA: Diagnosis not present

## 2016-12-25 MED ORDER — IOPAMIDOL (ISOVUE-370) INJECTION 76%
INTRAVENOUS | Status: AC
  Filled 2016-12-25: qty 100

## 2016-12-25 MED ORDER — IOPAMIDOL (ISOVUE-370) INJECTION 76%
INTRAVENOUS | Status: AC
  Administered 2016-12-25: 75 mL
  Filled 2016-12-25: qty 50

## 2016-12-25 NOTE — Progress Notes (Signed)
Cardiology Office Note   Date:  12/26/2016   ID:  ANJULIE Hicks, DOB 1955/10/25, MRN [id number]  PCP:  Billee Cashing, MD  Cardiologist:  Dr. Katrinka Blazing    Chief Complaint  Patient presents with  . Hospitalization Follow-up      History of Present Illness: Christine Hicks is a 61 y.o. female who presents for hospital follow up for eval of her bicuspid aortic valve with progressive aortic stenosis and HTN.  She has severe AS and has previously refused therapy.  She was recentlyu seen by Dr. Katrinka Blazing for PND and palpitations,. Echo with AS with pk velocity 5.97 m/s pk gradient 143 mmHg previouswly 4.8 m/s and 92 mmHg a year prior.  Now critical AS ---pt arranged for Lt and RT heart cath.  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   Cath with normal coronary arteries, normal pulmonary pressures, CWP 14 mmHg, and her acute on chronic diastilic HF was resolved.  Critical AS on bicuspid AV with documented greater than 120 mm peak to peak and 85 mm mean gradient by recent echo.   TSH was 0.08 and synthroid was deceased. She will follow up with Dr. Clifton James for TAVR consideration.  Pt on eluquis.   Her lisinopril held for diuresis now with HTN. Her SOB has increased since discharge, now sleeping on 3 pillows and neck pillow. She doesn't not sleep well due to insomnia.  She has lower ext edema and her appetite is diminished some.  No constipation.  Her SOB is not as severe as when she was hospitalized.  She is watching her salt.  Her scales are broken.   She is to se Dr. Cornelius Moras today. Chest CT was done yesterday.  See below.   Past Medical History:  Diagnosis Date  . Acute on chronic diastolic congestive heart failure (HCC)   . Anemia, iron deficiency 12/11/2016   negative egd/colonoscopy 11/16/2015  . Bicuspid aortic valve 12/11/2016  . Hypertension 12/11/2016  . Thyroid disease 12/11/2016    Past Surgical History:  Procedure Laterality Date  . 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 COLONOSCOPY N/A 11/16/2015   Procedure: COLONOSCOPY;  Surgeon: Iva Boop, MD;  Location: [facility] ENDOSCOPY;  Service: Endoscopy;  Laterality: N/A;  . ESOPHAGOGASTRODUODENOSCOPY N/A 11/16/2015   Procedure: ESOPHAGOGASTRODUODENOSCOPY (EGD);  Surgeon: Iva Boop, MD;  Location: [facility] ENDOSCOPY;  Service: Endoscopy;  Laterality: N/A;  . HERNIA REPAIR    . Right leg cellulitis surgery       Current Outpatient Prescriptions  Medication Sig Dispense Refill  . apixaban (ELIQUIS) 5 MG TABS tablet Take 1 tablet (5 mg total) by mouth 2 (two) times daily. 180 tablet 3  . ferrous sulfate 325 (65 FE) MG tablet Take 1 tablet (325 mg total) by mouth 2 (two) times daily with a meal. 60 tablet 0  . furosemide (LASIX) 40 MG tablet TAKE 1 TABLET (40 MG TOTAL) BY MOUTH DAILY. 90 tablet 3  . levothyroxine (SYNTHROID, LEVOTHROID) 150 MCG tablet Take 1 tablet (150 mcg total) by mouth daily before breakfast. 30 tablet 2  . pantoprazole (PROTONIX) 40 MG tablet TAKE 1 TABLET (40 MG TOTAL) BY MOUTH DAILY. 30 tablet 6  . potassium chloride (KLOR-CON M10) 10 MEQ tablet Take 1 tablet (10 mEq total) by mouth daily. 90 tablet 3   No current facility-administered medications for this visit.     Allergies:   Patient has no known allergies.  Social History:  The patient  reports that she has quit smoking. She has never used smokeless tobacco. She reports that she does not drink alcohol or use drugs.   Family History:  The patient's family history includes Cardiomyopathy in her son; Emphysema in her father; Heart attack in her mother; Lung cancer in her maternal aunt.    ROS:  General:no colds or fevers, wt up 1 lb but with shoes on as well Skin:no rashes or ulcers HEENT:no blurred vision, no congestion CV:see HPI PUL:see HPI GI:no diarrhea constipation or melena, no indigestion GU:no hematuria, no dysuria MS:no joint pain, no claudication Neuro:no syncope, no  lightheadedness Endo:no diabetes, no thyroid disease  Wt Readings from Last 3 Encounters:  12/26/16 279 lb 12.8 oz (126.9 kg)  12/17/16 278 lb (126.1 kg)  12/11/16 288 lb (130.6 kg)     PHYSICAL EXAM: VS:  BP (!) 170/100 (BP Location: Left Wrist, Patient Position: Sitting, Cuff Size: Normal)   Pulse 63   Ht 5\' 5"  (1.651 m)   Wt 279 lb 12.8 oz (126.9 kg)   LMP  (LMP Unknown)   SpO2 97%   BMI 46.56 kg/m  , BMI Body mass index is 46.56 kg/m. General:Pleasant affect, NAD Skin:Warm and dry, brisk capillary refill HEENT:normocephalic, sclera clear, mucus membranes moist Neck:supple, no JVD, no bruits  Heart:S1S2 RRR with 3/6 aortic murmur, + radiation to neck, no gallup, rub or click Lungs: diminished  without rales, rhonchi, or wheezes AOZ:HYQMV,HQIO, non tender, + BS, do not palpate liver spleen or masses Ext:2+ lower ext edema to just below knees Rt > Lt. , 2+ radial pulses Neuro:alert and oriented X 3, MAE, follows commands, + facial symmetry    EKG:  EKG is ordered today. The ekg ordered today demonstrates SR at 63 with RBBB, T wave inversion in Inf. Leads is old.    Recent Labs: 12/11/2016: ALT 26; B Natriuretic Peptide 81.6; Magnesium 2.1; TSH 0.080 12/15/2016: Hemoglobin 14.5; Platelets 156 12/17/2016: BUN 12; Creatinine, Ser 0.76; Potassium 4.1; Sodium 139    Lipid Panel    Component Value Date/Time   CHOL  09/11/2009 0555    124        ATP III CLASSIFICATION:  <200     mg/dL   Desirable  962-952  mg/dL   Borderline High  >=841    mg/dL   High          TRIG 324 (H) 09/11/2009 0555   HDL 14 (L) 09/11/2009 0555   CHOLHDL 8.9 09/11/2009 0555   VLDL 40 09/11/2009 0555   LDLCALC  09/11/2009 0555    70        Total Cholesterol/HDL:CHD Risk Coronary Heart Disease Risk Table                     Men   Women  1/2 Average Risk   3.4   3.3  Average Risk       5.0   4.4  2 X Average Risk   9.6   7.1  3 X Average Risk  23.4   11.0        Use the calculated Patient  Ratio above and the CHD Risk Table to determine the patient's CHD Risk.        ATP III CLASSIFICATION (LDL):  <100     mg/dL   Optimal  401-027  mg/dL   Near or Above  Optimal  130-159  mg/dL   Borderline  010-272  mg/dL   High  >536     mg/dL   Very High       Other studies Reviewed: Additional studies/ records that were reviewed today include:. ECHO 11/20/16 Study Conclusions  - Left ventricle: The cavity size was normal. Wall thickness was   increased in a pattern of moderate LVH. Systolic function was   normal. The estimated ejection fraction was in the range of 55%   to 60%. Wall motion was normal; there were no regional wall   motion abnormalities. Doppler parameters are consistent with   abnormal left ventricular relaxation (grade 1 diastolic   dysfunction). - Aortic valve: There was severe stenosis. Mean gradient (S): 82 mm   Hg. Valve area (VTI): 0.42 cm^2. Valve area (Vmean): 0.43 cm^2. - Left atrium: The atrium was mildly dilated. - Right atrium: The atrium was mildly dilated. - Tricuspid valve: There was moderate regurgitation. - Pulmonary arteries: Systolic pressure was mildly to moderately   increased. PA peak pressure: 45 mm Hg (S).  Impressions:  - Aortic stenosis parameters have progressed compared to previous   echo 10/2015.  Cardiac Cath 12/16/16 Procedures   Right Heart Cath and Coronary Angiography  Conclusion    Critical aortic stenosis, on a bicuspid aortic valve with documented greater than 120 mm peak to peak and 85 mm mean gradient by recent echo.  Normal coronary arteries.  Normal pulmonary pressures. The capillary wedge pressure is 14 mmHg mean.   Acute on chronic diastolic heart failure, resolved after inpatient diuresis over the past 4 days.   RECOMMENDATIONS:   Heart valve team workup followed by TAVR (preferrable) vs SAVR.  Resolution of heart failure has occurred and recommend starting furosemide 40  mg by mouth daily.  Home in a.m. if no postprocedure complications.  Indications   Critical aortic valve stenosis [I35.0 (ICD-10-CM)]  Acute on chronic diastolic heart failure (HCC) [I50.33 (ICD-10-CM)]    Most Recent Value  Fick Cardiac Output 6.12 L/min  Fick Cardiac Output Index 2.7 (L/min)/BSA  RA A Wave 3 mmHg  RA V Wave 3 mmHg  RA Mean 2 mmHg  RV Systolic Pressure 34 mmHg  RV Diastolic Pressure 2 mmHg  RV EDP 3 mmHg  PA Systolic Pressure 33 mmHg  PA Diastolic Pressure 14 mmHg  PA Mean 22 mmHg  PW A Wave 16 mmHg  PW V Wave 0 mmHg  PW Mean 14 mmHg  AO Systolic Pressure 143 mmHg  AO Diastolic Pressure 76 mmHg  AO Mean 100 mmHg  QP/QS 1  TPVR Index 8.15 HRUI  TSVR Index 37.06 HRUI  PVR SVR Ratio 0.08  TPVR/TSVR Ratio 0.22    CTA CHEST FINDINGS  Cardiovascular: Heart size is mildly enlarged with concentric left ventricular hypertrophy. There is no significant pericardial fluid, thickening or pericardial calcification. Severe thickening and calcification of the aortic valve. Atherosclerotic calcifications in the thoracic aorta. No definite coronary artery calcifications are identified.  Mediastinum/Lymph Nodes: No pathologically enlarged mediastinal or hilar lymph nodes. Esophagus is unremarkable in appearance. No axillary lymphadenopathy.  Lungs/Pleura: The patchy areas of linear scarring are noted throughout the lung bases bilaterally. No acute consolidative airspace disease. No pleural effusions. No definite suspicious appearing pulmonary nodules or masses are noted.  Musculoskeletal/Soft Tissues: There are no aggressive appearing lytic or blastic lesions noted in the visualized portions of the skeleton.  CTA ABDOMEN AND PELVIS FINDINGS  Hepatobiliary: Liver is enlarged measuring 25.7 cm craniocaudally. No  definite suspicious cystic or solid hepatic lesions are identified. No intra or extrahepatic biliary ductal dilatation. Gallbladder is normal in  appearance.  Pancreas: No pancreatic mass. No pancreatic ductal dilatation. No pancreatic or peripancreatic fluid or inflammatory changes.  Spleen: Spleen is enlarged measuring 9.4 x 5.7 x 14.6 cm (estimated splenic volume of 391 mL).  Adrenals/Urinary Tract: Subcentimeter low-attenuation lesion in the upper pole the left kidney is too small to definitively characterize, but is statistically likely a cyst. Right kidney and bilateral adrenal glands are normal in appearance. Ptosis of both kidneys show related to downward movement of most abdominal structures related to the patient's large abdominal wall hernia. Urinary bladder is grossly normal in appearance.  Stomach/Bowel: Stomach is normal in appearance. There is no pathologic dilatation of small bowel or colon. The patient has a very large ventral hernia containing much of the small bowel and colon. Several colonic diverticulae are noted, without surrounding inflammatory changes to suggest an acute diverticulitis at this time. The appendix is not confidently identified and may be surgically absent. Regardless, there are no inflammatory changes noted adjacent to the cecum to suggest the presence of an acute appendicitis at this time.  Vascular/Lymphatic: Aortic atherosclerosis, without evidence of aneurysm or dissection. Vascular findings and measurements pertinent to potential TAVR procedure, as detailed below. Celiac axis, superior mesenteric artery and inferior mesenteric artery are all widely patent without hemodynamically significant stenosis. Single renal arteries are widely patent bilaterally. No lymphadenopathy noted in the abdomen or pelvis.  Reproductive: Rim calcified lesion in the fundus of the uterus anteriorly, presumably a calcified fibroid. Ovaries are unremarkable in appearance.  Other: No significant volume of ascites.  No pneumoperitoneum.  Musculoskeletal: There are no aggressive appearing lytic  or blastic lesions noted in the visualized portions of the skeleton.  VASCULAR MEASUREMENTS PERTINENT TO TAVR:  AORTA:  Minimal Aortic Diameter -  10 x 8 mm  Severity of Aortic Calcification -  mild  RIGHT PELVIS:  Right Common Iliac Artery -  Minimal Diameter - 6.9 x 6.6 mm  Tortuosity - mild  Calcification - mild  Right External Iliac Artery -  Minimal Diameter - 5.3 x 4.9 mm  Tortuosity - mild  Calcification - mild  Right Common Femoral Artery -  Minimal Diameter - 5.1 x 4.6 mm  Tortuosity - mild  Calcification - mild  LEFT PELVIS:  Left Common Iliac Artery -  Minimal Diameter - 6.2 x 6.7 mm  Tortuosity - mild  Calcification - mild  Left External Iliac Artery -  Minimal Diameter - 5.3 x 5.0 mm  Tortuosity - mild  Calcification - mild  Left Common Femoral Artery -  Minimal Diameter - 5.5 x 4.2 mm  Tortuosity - mild  Calcification - mild  Review of the MIP images confirms the above findings.  IMPRESSION: 1. Vascular findings and measurements pertinent to potential TAVR procedure, as detailed above. This patient does not appear to have suitable pelvic arterial access on either side. 2. Severe thickening calcification of the aortic valve, compatible with the reported clinical history of severe aortic stenosis. This is associated with cardiomegaly and severe concentric left ventricular hypertrophy. 3. Massive ventral hernia containing much of the small bowel and colon, without evidence of bowel incarceration or obstruction at this time. This is also associated downward displacement of abdominal organs, most notably the kidneys bilaterally. 4. Additional incidental findings, as above.   ASSESSMENT AND PLAN:  1.  Critical AS to see Dr. Cornelius Moras today and has appt  with Dr. Clifton JamesMcalhany- surgery most likely next step  2.  Acute on Chronic diastolic heart failure--  Increased SOB increase Lasix to 40 mg BID if no  improvement she may need admit for IV diuretics. Check BMP today-  If SOB does not improve she will call and we will see and maybe admit.   3.  HTN elevated today has not had lasix yet.  And lisinopril was stopped for diuresis and cath.  She had contrast yesterday check BMP - discussed with Dr. Mayford Knifeurner DOD and will add amlodipine 5 mg today with the increase of lasix.  4.  Patent coronary arteries on cath 12/16/16  5. Bicuspid aortic valve now critical AS   6. Hypothyroid TSH 0.080 recently and decrease of synthroid.   7. RBBB chronic.  8. Anticoagulation with Eliquis for DVT - no bleeding issues.     Current medicines are reviewed with the patient today.  The patient Has no concerns regarding medicines.  The following changes have been made:  See above Labs/ tests ordered today include:see above  Disposition:   FU:  see above  Signed, Nada BoozerLaura Haik Mahoney, NP  12/26/2016 10:52 AM    [facility] [address], [place], [place]  [address]/ [address] [place], [place] Phone: [phone]; Fax: [phone]) [phone]  [phone]

## 2016-12-26 ENCOUNTER — Encounter: Payer: Self-pay | Admitting: Thoracic Surgery (Cardiothoracic Vascular Surgery)

## 2016-12-26 ENCOUNTER — Institutional Professional Consult (permissible substitution) (INDEPENDENT_AMBULATORY_CARE_PROVIDER_SITE_OTHER): Payer: BC Managed Care – PPO | Admitting: Thoracic Surgery (Cardiothoracic Vascular Surgery)

## 2016-12-26 ENCOUNTER — Encounter: Payer: Self-pay | Admitting: Cardiology

## 2016-12-26 ENCOUNTER — Ambulatory Visit (INDEPENDENT_AMBULATORY_CARE_PROVIDER_SITE_OTHER): Payer: BC Managed Care – PPO | Admitting: Cardiology

## 2016-12-26 ENCOUNTER — Other Ambulatory Visit: Payer: Self-pay | Admitting: *Deleted

## 2016-12-26 ENCOUNTER — Encounter (INDEPENDENT_AMBULATORY_CARE_PROVIDER_SITE_OTHER): Payer: Self-pay

## 2016-12-26 VITALS — BP 142/86 | HR 75 | Resp 16 | Ht 65.0 in | Wt 280.0 lb

## 2016-12-26 VITALS — BP 170/100 | HR 63 | Ht 65.0 in | Wt 279.8 lb

## 2016-12-26 DIAGNOSIS — Q230 Congenital stenosis of aortic valve: Secondary | ICD-10-CM

## 2016-12-26 DIAGNOSIS — I350 Nonrheumatic aortic (valve) stenosis: Secondary | ICD-10-CM | POA: Diagnosis not present

## 2016-12-26 DIAGNOSIS — O2230 Deep phlebothrombosis in pregnancy, unspecified trimester: Secondary | ICD-10-CM

## 2016-12-26 DIAGNOSIS — R0602 Shortness of breath: Secondary | ICD-10-CM | POA: Diagnosis not present

## 2016-12-26 DIAGNOSIS — I5033 Acute on chronic diastolic (congestive) heart failure: Secondary | ICD-10-CM

## 2016-12-26 DIAGNOSIS — E039 Hypothyroidism, unspecified: Secondary | ICD-10-CM | POA: Diagnosis not present

## 2016-12-26 DIAGNOSIS — I10 Essential (primary) hypertension: Secondary | ICD-10-CM

## 2016-12-26 DIAGNOSIS — Q231 Congenital insufficiency of aortic valve: Secondary | ICD-10-CM

## 2016-12-26 DIAGNOSIS — I82409 Acute embolism and thrombosis of unspecified deep veins of unspecified lower extremity: Secondary | ICD-10-CM | POA: Diagnosis not present

## 2016-12-26 DIAGNOSIS — Q2381 Bicuspid aortic valve: Secondary | ICD-10-CM

## 2016-12-26 LAB — BASIC METABOLIC PANEL
BUN / CREAT RATIO: 13 (ref 12–28)
BUN: 10 mg/dL (ref 8–27)
CHLORIDE: 99 mmol/L (ref 96–106)
CO2: 24 mmol/L (ref 18–29)
Calcium: 9.5 mg/dL (ref 8.7–10.3)
Creatinine, Ser: 0.76 mg/dL (ref 0.57–1.00)
GFR calc Af Amer: 97 mL/min/{1.73_m2} (ref >59)
GFR calc non Af Amer: 84 mL/min/{1.73_m2} (ref >59)
GLUCOSE: 92 mg/dL (ref 65–99)
Potassium: 4 mmol/L (ref 3.5–5.2)
Sodium: 139 mmol/L (ref 134–144)

## 2016-12-26 MED ORDER — FUROSEMIDE 40 MG PO TABS: 40.0000 mg | ORAL_TABLET | Freq: Two times a day (BID) | ORAL | 3 refills | Status: DC

## 2016-12-26 MED ORDER — ENOXAPARIN (LOVENOX) PATIENT EDUCATION KIT: PACK | Freq: Once | Status: DC

## 2016-12-26 MED ORDER — ENOXAPARIN SODIUM 150 MG/ML ~~LOC~~ SOLN: 80.0000 mg | SUBCUTANEOUS | Status: DC

## 2016-12-26 MED ORDER — AMLODIPINE BESYLATE 5 MG PO TABS: 5.0000 mg | ORAL_TABLET | Freq: Every day | ORAL | 3 refills | Status: DC

## 2016-12-26 NOTE — Patient Instructions (Signed)
Stop taking Eliquis now  Begin taking Lovenox injections on 6/3  Continue taking all other medications without change through the day before surgery.  Have nothing to eat or drink after midnight the night before surgery.  On the morning of surgery take only Protonix, Norvasc and Synthroid with a sip of water.  Do not take Lovenox on the morning of surgery

## 2016-12-26 NOTE — Patient Instructions (Signed)
Medication Instructions:  Your physician has recommended you make the following change in your medication: 1.) START Amlodipine 5 mg daily. 2.) INCREASE your lasix to twice a day  Labwork: Your physician recommends that you return for lab work today for BMET  Testing/Procedures: None Ordered   Follow-Up: Your physician recommends that you schedule a follow-up appointment in: keep your appointment with Dr. Clifton JamesMcAlhany  Any Other Special Instructions Will Be Listed Below (If Applicable).     If you need a refill on your cardiac medications before your next appointment, please call your pharmacy.  Thank you for choosing [facility]  Corrine CMA AAMA

## 2016-12-26 NOTE — Progress Notes (Signed)
[facility]  MULTIDISCIPLINARY HEART VALVE CLINIC  CARDIOTHORACIC SURGERY CONSULTATION REPORT  Referring Provider is Christine Records, MD PCP is Christine Cashing, MD  Chief Complaint  Patient presents with  . 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  The patient states that she was told she had a heart murmur during childhood and has known of the presence of a heart murmur for all of her adult life.  However, she never had a formal cardiac evaluation until April 2017 when she was hospitalized with acute exacerbation of chronic shortness of breath.  BNP was elevated and she ruled out for acute myocardial infarction. She was notably anemic at the time with hemoglobin 7.2 and Hemoccult positive stool. Symptoms resolved with diuretic therapy and transfusion of packed red blood cells.  Echocardiogram performed at that time revealed normal left ventricular systolic function with severe aortic stenosis.  She will underwent EGD and colonoscopy which did not show any active bleeding. She was noted to have diverticulosis, internal hemorrhoids, and nonbleeding erosive gastropathy. She was also noted to have deep venous thrombosis in the right lower leg with history of DVT in the remote past. She was started on anticoagulation using Eliquis.  The patient refused to proceed with further cardiac workup to consider aortic valve replacement at that time and has been followed ever since by Dr. Katrinka Hicks.  She  was seen in follow-up on 12/11/2016 at which time she complained of significant progression of symptoms of shortness of breath including orthopnea, PND, and palpitations with recumbency. Repeat echocardiogram revealed further progression in severity of aortic stenosis with peak velocity across aortic valve measured close to 6 m/s.  She was admitted to the hospital for intravenous diuretic therapy for treatment of acute exacerbation of chronic diastolic congestive heart failure. During her hospitalization she underwent diagnostic cardiac catheterization which was notable for the absence of significant coronary artery disease.  Cardiothoracic surgical consultation was requested.  The patient is married and lives locally in [place] with her husband and son. She has been retired and drawn disability since approximately 2006 at which time she was hospitalized for a prolonged period time with complications related to ventral hernia repair. She lives a very sedentary lifestyle and has been morbidly obese for most of her adult life. She admits that she does not get around much physically at all but she is ambulatory and walks using a cane. She describes a greater than one-year history of progressive symptoms of exertional shortness of breath which have been increasing recently. She now gets short of breath with minimal activity and she has had some occasional resting shortness of breath. She cannot lay flat in bed. She has occasional palpitations. She denies any history of dizzy spells or syncope. She has never had any chest pain or chest tightness either with activity or at rest. She has chronic lower extremity edema. Since her recent hospital discharge she reports that her shortness of breath has progressed a little bit and she was seen in follow-up earlier today at [facility] where her diuretic dose was increased.  Past Medical History:  Diagnosis Date  . Acute on chronic diastolic congestive heart failure  (HCC)   . Anemia, iron deficiency 12/11/2016   negative egd/colonoscopy 11/16/2015  . Aortic stenosis, severe   . Bicuspid aortic valve 12/11/2016  . Hypertension 12/11/2016  . Thyroid disease 12/11/2016    Past Surgical History:  Procedure Laterality Date  . 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 COLONOSCOPY N/A 11/16/2015   Procedure: COLONOSCOPY;  Surgeon: Iva Boop, MD;  Location: [facility] ENDOSCOPY;  Service: Endoscopy;  Laterality: N/A;  . ESOPHAGOGASTRODUODENOSCOPY N/A 11/16/2015   Procedure: ESOPHAGOGASTRODUODENOSCOPY (EGD);  Surgeon: Iva Boop, MD;  Location: [facility] ENDOSCOPY;  Service: Endoscopy;  Laterality: N/A;  . HERNIA REPAIR    . Right leg cellulitis surgery      Family History  Problem Relation Age of Onset  . Cardiomyopathy Son   . Heart attack Mother   . Emphysema Father   . Lung cancer Maternal Aunt     Social History   Social History  . Marital status: Significant Other    Spouse name: N/A  . Number of children: N/A  . Years of education: N/A   Occupational History  . Not on file.   Social History Main Topics  . Smoking status: Former Games developer  . Smokeless tobacco: Never Used  . Alcohol use No  . Drug use: No  . Sexual activity: Yes   Other Topics Concern  . Not on file   Social History Narrative  . No narrative on file    Current Outpatient Prescriptions  Medication Sig Dispense Refill  . amLODipine (NORVASC) 5 MG tablet Take 1 tablet (5 mg total) by mouth daily. 180 tablet 3  . apixaban (ELIQUIS) 5 MG TABS tablet Take 1 tablet (5 mg total) by mouth 2 (two) times daily. 180 tablet 3  . ferrous sulfate 325 (65 FE) MG tablet Take 1 tablet (325 mg total) by mouth 2 (two) times daily with a meal. 60 tablet 0  . furosemide (LASIX) 40 MG tablet Take 1 tablet (40 mg total) by mouth 2 (two) times daily. 90 tablet 3  . levothyroxine (SYNTHROID, LEVOTHROID) 150 MCG tablet Take 1 tablet (150 mcg total) by mouth daily  before breakfast. 30 tablet 2  . pantoprazole (PROTONIX) 40 MG tablet TAKE 1 TABLET (40 MG TOTAL) BY MOUTH DAILY. 30 tablet 6  . potassium chloride (KLOR-CON M10) 10 MEQ tablet Take 1 tablet (10 mEq total) by mouth daily. 90 tablet 3   No current facility-administered medications for this visit.     No Known Allergies    Review of Systems:   General:  normal appetite, decreased energy, no weight gain, no weight loss, no fever  Cardiac:  no chest pain with exertion, no chest pain at rest, + SOB with exertion, + intermittent resting SOB, + PND, + orthopnea, + palpitations, no arrhythmia, no atrial fibrillation, + LE edema, no dizzy spells, no syncope  Respiratory:  + chronic shortness of breath, no home oxygen, no productive cough, no dry cough, no bronchitis, no wheezing, no hemoptysis, no asthma, no pain with inspiration or cough, + sleep apnea, no CPAP at night  GI:   occasional difficulty swallowing, + reflux, no frequent heartburn, no hiatal hernia, no abdominal pain, no constipation, no diarrhea, no hematochezia, no hematemesis, no melena  GU:   no dysuria,  no frequency, no urinary tract infection, no hematuria, no kidney stones, no kidney disease  Vascular:  no pain suggestive of claudication, + pain in feet, + leg cramps, no varicose veins, + DVT, no non-healing foot ulcer  Neuro:   no stroke, no TIA's, no seizures, no headaches, no temporary blindness one eye,  no slurred speech, no peripheral neuropathy, no chronic pain, mild instability of gait, no memory/cognitive dysfunction  Musculoskeletal: + arthritis particularly in knees, + joint swelling, no myalgias, + difficulty walking - uses a cane for stability, diminished mobility   Skin:   no rash, no itching, no skin infections, no pressure sores or ulcerations  Psych:   no anxiety, no depression, no nervousness, no unusual recent stress  Eyes:   no blurry vision, no floaters, no recent vision changes, + wears glasses or  contacts  ENT:   no hearing loss, no loose or painful teeth but several teeth that are broken off, + partial dentures, last saw dentist > 2 years ago  Hematologic:  + easy bruising, no abnormal bleeding, no clotting disorder, no frequent epistaxis  Endocrine:  no diabetes, does not check CBG's at home           Physical Exam:   BP (!) 142/86 (BP Location: Right Arm, Patient Position: Sitting, Cuff Size: Large)   Pulse 75   Resp 16   Ht 5\' 5"  (1.651 m)   Wt 280 lb (127 kg)   LMP  (LMP Unknown)   SpO2 99% Comment: ON RA  BMI 46.59 kg/m   General:  Morbidly obese with limited mobility, o/w well-appearing  HEENT:  Unremarkable   Neck:   no JVD, no bruits, no adenopathy   Chest:   clear to auscultation, symmetrical breath sounds, no wheezes, no rhonchi   CV:   RRR, grade III/VI crescendo/decrescendo murmur heard best at LSB,  no diastolic murmur  Abdomen:  soft, non-tender, no masses, + ventral hernia  Extremities:  warm, well-perfused, pulses not palpable, + bilateral LE edema  Rectal/GU  Deferred  Neuro:   Grossly non-focal and symmetrical throughout  Skin:   Clean and dry, no rashes, no breakdown   Diagnostic Tests:  Transthoracic Echocardiography  Patient:    Christine Hicks, Christine Hicks MR #:       [id number] Study Date: 11/20/2016 Gender:     F Age:        68 Height:     165.1 cm Weight:     129.9 kg BSA:        2.52 m^2 Pt. Status: Room:   Hilma Favors, MD  REFERRING    Christine Records, MD  ATTENDING    Default, Provider [phone]  PERFORMING   [place] [place]  SONOGRAPHER  Quentin Ore, RVT, RDCS, RDMS  cc:  ------------------------------------------------------------------- LV EF: 55% -   60%  ------------------------------------------------------------------- History:   PMH:   Dyspnea.  Aortic valve disease.  Risk factors: Former tobacco use. Diabetes mellitus. Obese.  ------------------------------------------------------------------- Study  Conclusions  - Left ventricle: The cavity size was normal. Wall thickness was   increased in a pattern of moderate LVH. Systolic function was   normal. The estimated ejection fraction was in the range of 55%   to 60%. Wall motion was normal; there were no regional wall   motion abnormalities. Doppler parameters are consistent with   abnormal left ventricular relaxation (grade 1 diastolic   dysfunction). - Aortic valve: There was severe stenosis. Mean gradient (S): 82 mm   Hg. Valve area (VTI): 0.42 cm^2. Valve area (Vmean): 0.43 cm^2. -  Left atrium: The atrium was mildly dilated. - Right atrium: The atrium was mildly dilated. - Tricuspid valve: There was moderate regurgitation. - Pulmonary arteries: Systolic pressure was mildly to moderately   increased. PA peak pressure: 45 mm Hg (S).  Impressions:  - Aortic stenosis parameters have progressed compared to previous   echo 10/2015.  ------------------------------------------------------------------- Study data:  The previous study was not available, so comparison was made to the report of April 2017.  Study status:  Routine. Procedure:  Transthoracic echocardiography. Image quality was fair. The study was technically difficult, as a result of body habitus. Study completion:  There were no complications. Transthoracic echocardiography.  M-mode, complete 2D, spectral Doppler, and color Doppler.  Birthdate:  Patient birthdate: [birth year].  Age:  Patient is 61 yr old.  Sex:  Gender: female. BMI: 47.7 kg/m^2.  Blood pressure:     142/82  Patient status: Outpatient.  Study date:  Study date: 11/20/2016. Study time: 12:09 PM.  -------------------------------------------------------------------  ------------------------------------------------------------------- Left ventricle:  The cavity size was normal. Wall thickness was increased in a pattern of moderate LVH. Systolic function was normal. The estimated ejection fraction was  in the range of 55% to 60%. Wall motion was normal; there were no regional wall motion abnormalities. Doppler parameters are consistent with abnormal left ventricular relaxation (grade 1 diastolic dysfunction).  ------------------------------------------------------------------- Aortic valve:   Moderately calcified leaflets.  Doppler:   There was severe stenosis.   There was no regurgitation.    VTI ratio of LVOT to aortic valve: 0.15. Valve area (VTI): 0.42 cm^2. Indexed valve area (VTI): 0.17 cm^2/m^2. Mean velocity ratio of LVOT to aortic valve: 0.15. Valve area (Vmean): 0.43 cm^2. Indexed valve area (Vmean): 0.17 cm^2/m^2.    Mean gradient (S): 82 mm Hg. Peak gradient (S): 143 mm Hg.  ------------------------------------------------------------------- Aorta:  Aortic root: The aortic root was normal in size. Ascending aorta: The ascending aorta was normal in size.  ------------------------------------------------------------------- Mitral valve:   Structurally normal valve.   Leaflet separation was normal.  Doppler:  Transvalvular velocity was within the normal range. There was no evidence for stenosis. There was trivial regurgitation.    Valve area by pressure half-time: 2.56 cm^2. Indexed valve area by pressure half-time: 1.02 cm^2/m^2.    Peak gradient (D): 2 mm Hg.  ------------------------------------------------------------------- Left atrium:  The atrium was mildly dilated.  ------------------------------------------------------------------- Right ventricle:  The cavity size was normal. Wall thickness was normal. Systolic function was normal.  ------------------------------------------------------------------- Pulmonic valve:   Poorly visualized.  Doppler:  There was no significant regurgitation.  ------------------------------------------------------------------- Tricuspid valve:   Structurally normal valve.   Leaflet separation was normal.  Doppler:   Transvalvular velocity was within the normal range. There was moderate regurgitation.  ------------------------------------------------------------------- Pulmonary artery:   Systolic pressure was mildly to moderately increased.  ------------------------------------------------------------------- Right atrium:  The atrium was mildly dilated.  ------------------------------------------------------------------- Pericardium:  There was no pericardial effusion.  ------------------------------------------------------------------- Systemic veins: Inferior vena cava: The vessel was normal in size. The respirophasic diameter changes were in the normal range (>= 50%), consistent with normal central venous pressure.  ------------------------------------------------------------------- Measurements   Left ventricle                           Value          Reference  LV ID, ED, PLAX chordal                  43    mm  43 - 52  LV PW thickness, ED                      15    mm       ----------  IVS/LV PW ratio, ED                      1.13           <=1.3  Stroke volume, 2D                        63    ml       ----------  Stroke volume/bsa, 2D                    25    ml/m^2   ----------  LV e&', lateral                           6.64  cm/s     ----------  LV E/e&', lateral                         11.84          ----------  LV e&', medial                            5.77  cm/s     ----------  LV E/e&', medial                          13.62          ----------  LV e&', average                           6.21  cm/s     ----------  LV E/e&', average                         12.67          ----------    Ventricular septum                       Value          Reference  IVS thickness, ED                        17    mm       ----------    LVOT                                     Value          Reference  LVOT ID, S                               19    mm       ----------  LVOT area                                 2.84  cm^2     ----------  LVOT ID                                  19    mm       ----------  LVOT mean velocity, S                    65.4  cm/s     ----------  LVOT VTI, S                              22.2  cm       ----------  Stroke volume (SV), LVOT DP              62.9  ml       ----------  Stroke index (SV/bsa), LVOT DP           25    ml/m^2   ----------    Aortic valve                             Value          Reference  Aortic valve peak velocity, S            597   cm/s     ----------  Aortic valve mean velocity, S            427   cm/s     ----------  Aortic valve VTI, S                      151   cm       ----------  Aortic mean gradient, S                  82    mm Hg    ----------  Aortic peak gradient, S                  143   mm Hg    ----------  VTI ratio, LVOT/AV                       0.15           ----------  Aortic valve area, VTI                   0.42  cm^2     ----------  Aortic valve area/bsa, VTI               0.17  cm^2/m^2 ----------  Velocity ratio, mean, LVOT/AV            0.15           ----------  Aortic valve area, mean velocity         0.43  cm^2     ----------  Aortic valve area/bsa, mean              0.17  cm^2/m^2 ----------  velocity    Aorta                                    Value          Reference  Aortic root ID, ED  26    mm       ----------  Ascending aorta ID, A-P, S               29    mm       ----------    Left atrium                              Value          Reference  LA ID, A-P, ES                           34    mm       ----------  LA ID/bsa, A-P                           1.35  cm/m^2   <=2.2  LA volume, S                             71    ml       ----------  LA volume/bsa, S                         28.2  ml/m^2   ----------  LA volume, ES, 1-p A4C                   65.7  ml       ----------  LA volume/bsa, ES, 1-p A4C               26.1  ml/m^2   ----------  LA volume, ES, 1-p A2C                    76.5  ml       ----------  LA volume/bsa, ES, 1-p A2C               30.4  ml/m^2   ----------    Mitral valve                             Value          Reference  Mitral E-wave peak velocity              78.6  cm/s     ----------  Mitral A-wave peak velocity              96.5  cm/s     ----------  Mitral deceleration time         (H)     292   ms       150 - 230  Mitral pressure half-time                86    ms       ----------  Mitral peak gradient, D                  2     mm Hg    ----------  Mitral E/A ratio, peak                   0.8            ----------  Mitral valve area, PHT, DP  2.56  cm^2     ----------  Mitral valve area/bsa, PHT, DP           1.02  cm^2/m^2 ----------    Pulmonary arteries                       Value          Reference  PA pressure, S, DP               (H)     45    mm Hg    <=30    Right atrium                             Value          Reference  RA ID, S-I, ES, A4C                      46.7  mm       34 - 49  RA area, ES, A4C                         13.6  cm^2     8.3 - 19.5  RA volume, ES, A/L                       33.4  ml       ----------  RA volume/bsa, ES, A/L                   13.3  ml/m^2   ----------    Right ventricle                          Value          Reference  TAPSE                                    29    mm       ----------  RV s&', lateral, S                        10.4  cm/s     ----------    Pulmonic valve                           Value          Reference  Pulmonic valve peak velocity, S          78.1  cm/s     ----------  Legend: (L)  and  (H)  mark values outside specified reference range.  ------------------------------------------------------------------- Prepared and Electronically Authenticated by  Almond Lint, MD 2018-04-26T14:35:22   Right Heart Cath and Coronary Angiography  Conclusion    Critical aortic stenosis, on a bicuspid aortic valve with documented greater than 120  mm peak to peak and 85 mm mean gradient by recent echo.  Normal coronary arteries.  Normal pulmonary pressures. The capillary wedge pressure is 14 mmHg mean.   Acute on chronic diastolic heart failure, resolved after inpatient diuresis over the past 4 days.   RECOMMENDATIONS:   Heart valve team workup followed by TAVR (preferrable) vs SAVR.  Resolution of heart failure has occurred and  recommend starting furosemide 40 mg by mouth daily.  Home in a.m. if no postprocedure complications.  Indications   Critical aortic valve stenosis [I35.0 (ICD-10-CM)]  Acute on chronic diastolic heart failure (HCC) [I50.33 (ICD-10-CM)]  Procedural Details/Technique   Technical Details The right radial area was sterilely prepped and draped. Intravenous sedation with Versed and fentanyl was administered. 1% Xylocaine was infiltrated to achieve local analgesia. Using real-time vascular ultrasound, a double wall stick with an angiocath was utilized to obtain intra-arterial access. The modified Seldinger technique was used to place a 1052F " Slender" sheath in the right radial artery. Weight based heparin was administered. Coronary angiography was done using 5 F catheters. Right coronary angiography was performed with a JR4. Cine fluoroscopy demonstrated heavy aortic valve calcification. With the Judkins right catheter, a couple attempts to cross the valve with a 0.035 straight wire was unsuccessful. Knowing the valve is critically obstructed further attempts were not made. Left coronary angiography was performed with a JL 3.5 cm. A supravalvular aortic saturation was obtained.  Right heart catheterization was performed by exchanging a left antecubital angiocath IV for a 5 French brachial sheath. 1% Xylocaine local infiltration at the IV site was given prior to sheath exchange. The double glove technique was used. The modified Seldinger technique was employed. After sheath insertion, right heart cath was performed  using a 5 French balloon tipped catheter. Pressures were recorded in each chamber. A main pulmonary artery O2 saturation was obtained. The pulmonary wedge pressure was also measured.   Hemostasis was achieved using a pneumatic band.  During this procedure the patient is administered a total of Versed 3 mg and Fentanyl 100 mg to achieve and maintain moderate conscious sedation. The patient's heart rate, blood pressure, and oxygen saturation are monitored continuously during the procedure. The period of conscious sedation is 41 minutes, of which I was present face-to-face 100% of this time.The right radial area was sterilely prepped and draped. Intravenous sedation with Versed and fentanyl was administered. 1% Xylocaine was infiltrated to achieve local analgesia. A double wall stick with an angiocath was utilized to obtain intra-arterial access. The modified Seldinger technique was used to place a 1052F " Slender" sheath in the right radial artery. Weight based heparin was administered. Coronary angiography was done using 5 F catheters. Right coronary angiography was performed with a JR4. Left ventricular hemodymic recordings and angiography was done using the JR 4 catheter and hand injection. Left coronary angiography was performed with a JL 3.5 cm.  Right heart catheterization was performed by exchanging an antecubital angiocath IV for a 5 French brachial sheath. 1% Xylocaine local infiltration at the IV site was given prior to sheath exchange. A double glove technique was used. The modified Seldinger technique was employed. After sheath insertion, right heart cath was performed using a 5 French balloon tipped catheter. Pressures were recorded in each chamber. A main pulmonary artery O2 saturation was obtained. I wedge pressure was also measured.   Hemostasis was achieved using a pneumatic band.  During this procedure the patient is administered a total of Versed 3 mg and Fentanyl 100 mg to achieve and  maintain moderate conscious sedation. The patient's heart rate, blood pressure, and oxygen saturation are monitored continuously during the procedure. The period of conscious sedation is 41 minutes, of which I was present face-to-face 100% of this time.   Estimated blood loss <50 mL.  During this procedure the patient was administered the following to achieve and maintain moderate conscious sedation: Versed  3 mg, Fentanyl 100 mcg, while the patient's heart rate, blood pressure, and oxygen saturation were continuously monitored. The period of conscious sedation was 41 minutes, of which I was present face-to-face 100% of this time.    Coronary Findings   Dominance: Right  Left Anterior Descending  Lateral First Diagonal Branch  Vessel is small in size.  Ramus Intermedius  Vessel is small.  Left Circumflex  Lateral First Obtuse Marginal Branch  Vessel is small in size.  Second Obtuse Marginal Branch  Vessel is small in size.  Third Obtuse Marginal Branch  Vessel is small in size.  Right Heart   Right Heart Pressures Hemodynamic findings consistent with pulmonary hypertension.    Left Heart   Aortic Valve There is severe aortic valve stenosis. The aortic valve is calcified. There is restricted aortic valve motion.    Coronary Diagrams   Diagnostic Diagram       Implants     No implant documentation for this case.  PACS Images   Show images for Cardiac catheterization   Link to Procedure Log   Procedure Log    Hemo Data    Most Recent Value  Fick Cardiac Output 6.12 L/min  Fick Cardiac Output Index 2.7 (L/min)/BSA  RA A Wave 3 mmHg  RA V Wave 3 mmHg  RA Mean 2 mmHg  RV Systolic Pressure 34 mmHg  RV Diastolic Pressure 2 mmHg  RV EDP 3 mmHg  PA Systolic Pressure 33 mmHg  PA Diastolic Pressure 14 mmHg  PA Mean 22 mmHg  PW A Wave 16 mmHg  PW V Wave 0 mmHg  PW Mean 14 mmHg  AO Systolic Pressure 143 mmHg  AO Diastolic Pressure 76 mmHg  AO Mean 100 mmHg  QP/QS 1   TPVR Index 8.15 HRUI  TSVR Index 37.06 HRUI  PVR SVR Ratio 0.08  TPVR/TSVR Ratio 0.22     Cardiac TAVR CT  TECHNIQUE: The patient was scanned on a Philips 256 scanner. A 120 kV retrospective scan was triggered in the descending thoracic aorta at 111 HU's. Gantry rotation speed was 270 msecs and collimation was .9 mm. No beta blockade or nitro were given. The 3D data set was reconstructed in 5% intervals of the R-R cycle. Systolic and diastolic phases were analyzed on a dedicated work station using MPR, MIP and VRT modes. The patient received 80 cc of contrast.  FINDINGS: Aortic Valve: Bicuspid with non-separated left and right cusp, leaflets are severely thickened and calcified, there are moderate calcifications extending the LVOT.  Aorta: Normal size. Minimal calcifications in the aortic arch. No dissection.  Sinotubular Junction:  25 x 24 mm  Ascending Thoracic Aorta:  30 x 29 mm  Aortic Arch:  26 x 22 mm  Descending Thoracic Aorta:  23 x 22 mm  Sinus of Valsalva Measurements:  Non-coronary:  29 mm  Right -coronary:  27 mm  Left -coronary:  28 mm  Coronary Artery Height above Annulus:  Left Main:  10 mm  Right Coronary:  20 mm  Virtual Basal Annulus Measurements:  Maximum/Minimum Diameter:  24 x 20 mm  Perimeter:  79 mm  Area:  400 mm2  Optimum Fluoroscopic Angle for Delivery:  LAO 2 CAU 1  No thrombus in the left atrial appendage.  IMPRESSION: 1. Bicuspid aortic valve with non-separated left and right cusp, leaflets are severely thickened and calcified, there are moderate calcifications extending the LVOT. Annular measurements suitable for delivery of a 23 mm Edwards-SAPIEN 3 valve.  2. Optimum Fluoroscopic Angle for Delivery:  LAO 2 CAU 1  3.  Sufficient annulus to coronary distance.  4. No thrombus in the left atrial appendage.  The images are of suboptimal quality sec to patient's size and motion during  acquisition.  Tobias Alexander   Electronically Signed   By: Tobias Alexander   On: 12/26/2016 10:21    CT ANGIOGRAPHY CHEST, ABDOMEN AND PELVIS  TECHNIQUE: Multidetector CT imaging through the chest, abdomen and pelvis was performed using the standard protocol during bolus administration of intravenous contrast. Multiplanar reconstructed images and MIPs were obtained and reviewed to evaluate the vascular anatomy.  CONTRAST:  165 mL of Isovue 370  COMPARISON:  Chest CT 11/11/2015.  FINDINGS: CTA CHEST FINDINGS  Cardiovascular: Heart size is mildly enlarged with concentric left ventricular hypertrophy. There is no significant pericardial fluid, thickening or pericardial calcification. Severe thickening and calcification of the aortic valve. Atherosclerotic calcifications in the thoracic aorta. No definite coronary artery calcifications are identified.  Mediastinum/Lymph Nodes: No pathologically enlarged mediastinal or hilar lymph nodes. Esophagus is unremarkable in appearance. No axillary lymphadenopathy.  Lungs/Pleura: The patchy areas of linear scarring are noted throughout the lung bases bilaterally. No acute consolidative airspace disease. No pleural effusions. No definite suspicious appearing pulmonary nodules or masses are noted.  Musculoskeletal/Soft Tissues: There are no aggressive appearing lytic or blastic lesions noted in the visualized portions of the skeleton.  CTA ABDOMEN AND PELVIS FINDINGS  Hepatobiliary: Liver is enlarged measuring 25.7 cm craniocaudally. No definite suspicious cystic or solid hepatic lesions are identified. No intra or extrahepatic biliary ductal dilatation. Gallbladder is normal in appearance.  Pancreas: No pancreatic mass. No pancreatic ductal dilatation. No pancreatic or peripancreatic fluid or inflammatory changes.  Spleen: Spleen is enlarged measuring 9.4 x 5.7 x 14.6 cm (estimated splenic volume of 391  mL).  Adrenals/Urinary Tract: Subcentimeter low-attenuation lesion in the upper pole the left kidney is too small to definitively characterize, but is statistically likely a cyst. Right kidney and bilateral adrenal glands are normal in appearance. Ptosis of both kidneys show related to downward movement of most abdominal structures related to the patient's large abdominal wall hernia. Urinary bladder is grossly normal in appearance.  Stomach/Bowel: Stomach is normal in appearance. There is no pathologic dilatation of small bowel or colon. The patient has a very large ventral hernia containing much of the small bowel and colon. Several colonic diverticulae are noted, without surrounding inflammatory changes to suggest an acute diverticulitis at this time. The appendix is not confidently identified and may be surgically absent. Regardless, there are no inflammatory changes noted adjacent to the cecum to suggest the presence of an acute appendicitis at this time.  Vascular/Lymphatic: Aortic atherosclerosis, without evidence of aneurysm or dissection. Vascular findings and measurements pertinent to potential TAVR procedure, as detailed below. Celiac axis, superior mesenteric artery and inferior mesenteric artery are all widely patent without hemodynamically significant stenosis. Single renal arteries are widely patent bilaterally. No lymphadenopathy noted in the abdomen or pelvis.  Reproductive: Rim calcified lesion in the fundus of the uterus anteriorly, presumably a calcified fibroid. Ovaries are unremarkable in appearance.  Other: No significant volume of ascites.  No pneumoperitoneum.  Musculoskeletal: There are no aggressive appearing lytic or blastic lesions noted in the visualized portions of the skeleton.  VASCULAR MEASUREMENTS PERTINENT TO TAVR:  AORTA:  Minimal Aortic Diameter -  10 x 8 mm  Severity of Aortic Calcification -  mild  RIGHT PELVIS:  Right  Common Iliac Artery -  Minimal Diameter - 6.9 x 6.6 mm  Tortuosity - mild  Calcification - mild  Right External Iliac Artery -  Minimal Diameter - 5.3 x 4.9 mm  Tortuosity - mild  Calcification - mild  Right Common Femoral Artery -  Minimal Diameter - 5.1 x 4.6 mm  Tortuosity - mild  Calcification - mild  LEFT PELVIS:  Left Common Iliac Artery -  Minimal Diameter - 6.2 x 6.7 mm  Tortuosity - mild  Calcification - mild  Left External Iliac Artery -  Minimal Diameter - 5.3 x 5.0 mm  Tortuosity - mild  Calcification - mild  Left Common Femoral Artery -  Minimal Diameter - 5.5 x 4.2 mm  Tortuosity - mild  Calcification - mild  Review of the MIP images confirms the above findings.  IMPRESSION: 1. Vascular findings and measurements pertinent to potential TAVR procedure, as detailed above. This patient does not appear to have suitable pelvic arterial access on either side. 2. Severe thickening calcification of the aortic valve, compatible with the reported clinical history of severe aortic stenosis. This is associated with cardiomegaly and severe concentric left ventricular hypertrophy. 3. Massive ventral hernia containing much of the small bowel and colon, without evidence of bowel incarceration or obstruction at this time. This is also associated downward displacement of abdominal organs, most notably the kidneys bilaterally. 4. Additional incidental findings, as above.   Electronically Signed   By: Trudie Reed M.D.   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 She presents with long-standing  symptoms of exertional shortness of breath and fatigue consistent with chronic diastolic congestive heart failure that have recently progressed to the point where the patient needed hospitalization last week for class IV symptoms due to acute on chronic volume overload.  I have personally reviewed the patient's recent transthoracic echocardiogram, diagnostic cardiac catheterization, and CT angiograms.  Echocardiogram confirms the presence of what appears to be a functionally bicuspid aortic valve with severe thickening, calcification, and restricted leaflet mobility involving both leaflets of the aortic valve. Peak velocity across the aortic valve approaches 6 m/s corresponding to mean transvalvular gradient estimated 82 mmHg. Transvalvular gradients and decision-making are further influenced by the fact that the patient has relatively small sized aortic root. Left ventricular systolic function remains normal although the patient likely has significant diastolic dysfunction. Diagnostic cardiac catheterization is notable for the absence of significant coronary artery disease.  Cardiac gated CT angiogram of the heart confirms the presence of a bicuspid aortic valve (Sievers type I) with fusion of the left and right cusps and relatively small annular dimensions suitable for transcatheter aortic valve replacement using a 23 mm Edwards Sapien 3 transcatheter heart valve.  I agree that there is no question the patient needs aortic valve replacement as soon as practical. Risks associated with conventional surgery will be somewhat elevated because of the patient's morbid obesity and other numerous comorbid medical problems.  However, because of the patient's relatively young age and relatively small size aortic root I feel that she would best be treated with surgical aortic valve replacement rather than  transcatheter aortic valve replacement. Long-term durability of transcatheter aortic valve replacement remains unknown  at this time.  Furthermore, CT angiography suggests that her valve would need to be replaced using a 23 mm transcatheter heart valve which might leave her with some residual aortic stenosis and little room for valve in valve redo transcatheter aortic valve replacement should she develop structural valve deterioration in the future. Under the circumstances, I feel that she would probably best be treated using conventional surgical techniques with the potential benefits from use of a low-profile mechanical valve, a rapid deployment bioprosthetic tissue valve, aortic root enlargement, or aortic root replacement using a stentless porcine aortic root graft in order to minimize the risk of patient prosthesis mismatch.     Plan:  The patient and her family were counseled at length regarding treatment alternatives for management of severe aortic stenosis including continued medical therapy versus proceeding with aortic valve replacement in the near future.  The natural history of aortic stenosis was reviewed, as was long term prognosis with medical therapy alone.  Surgical options were discussed at length including conventional surgical aortic valve replacement through either a full median sternotomy or using minimally invasive techniques.  Other alternatives including rapid-deployment bioprosthetic tissue valve replacement, transcatheter aortic valve replacement, patch enlargement of the aortic root, and stentless porcine aortic root replacement were discussed.  Discussion was held comparing the relative risks of mechanical valve replacement with need for lifelong anticoagulation versus use of a bioprosthetic tissue valve and the associated potential for late structural valve deterioration and failure.  This discussion was placed in the context of the patient's particular circumstances, and as a result the patient specifically requests that their valve be replaced using a bioprosthetic tissue valve.  The potential  advantages and disadvantages associated with use of a rapid-deployment bioprosthetic aortic valve were discussed, including the risks of paravalvular leak, need for permanent pacemaker placement, and expectations for long-term durability.  The patient understands and accepts all potential associated risks of surgery including but not limited to risk of death, stroke, myocardial infarction, congestive heart failure, respiratory failure, renal failure, pneumonia, bleeding requiring blood transfusion and or reexploration, arrhythmia, heart block or bradycardia requiring permanent pacemaker, aortic dissection or other major vascular complication, pleural effusions or other delayed complications related to continued congestive heart failure, and other late complications related to valve replacement including structural valve deterioration and failure, thrombosis, endocarditis, or paravalvular leak.  We tentatively plan to proceed with surgery on 01/01/2017. The patient has been instructed to stop taking Eliquis. She will be given a prescription for Lovenox injections as a bridge prior to surgery. The patient has also been instructed to contact Dr. Michaelle Copas office if her breathing deteriorates any further between now and the time of surgery.  All of her questions have been addressed.     I spent in excess of 90 minutes during the conduct of this office consultation and >50% of this time involved direct face-to-face encounter with the patient for counseling and/or coordination of their care.    Salvatore Decent. Cornelius Moras, MD 12/26/2016 1:42 PM

## 2016-12-29 ENCOUNTER — Ambulatory Visit (HOSPITAL_COMMUNITY): Payer: Self-pay | Admitting: Dentistry

## 2016-12-29 ENCOUNTER — Encounter (HOSPITAL_COMMUNITY): Payer: Self-pay | Admitting: Dentistry

## 2016-12-29 ENCOUNTER — Other Ambulatory Visit: Payer: Self-pay | Admitting: *Deleted

## 2016-12-29 VITALS — BP 147/39 | HR 64 | Temp 97.6°F

## 2016-12-29 DIAGNOSIS — Z7901 Long term (current) use of anticoagulants: Secondary | ICD-10-CM

## 2016-12-29 DIAGNOSIS — M264 Malocclusion, unspecified: Secondary | ICD-10-CM

## 2016-12-29 DIAGNOSIS — K029 Dental caries, unspecified: Secondary | ICD-10-CM

## 2016-12-29 DIAGNOSIS — K036 Deposits [accretions] on teeth: Secondary | ICD-10-CM

## 2016-12-29 DIAGNOSIS — K045 Chronic apical periodontitis: Secondary | ICD-10-CM

## 2016-12-29 DIAGNOSIS — K083 Retained dental root: Secondary | ICD-10-CM

## 2016-12-29 DIAGNOSIS — K08409 Partial loss of teeth, unspecified cause, unspecified class: Secondary | ICD-10-CM

## 2016-12-29 DIAGNOSIS — K0530 Chronic periodontitis, unspecified: Secondary | ICD-10-CM

## 2016-12-29 DIAGNOSIS — K06020 Generalized gingival recession, unspecified: Secondary | ICD-10-CM

## 2016-12-29 DIAGNOSIS — Z9189 Other specified personal risk factors, not elsewhere classified: Secondary | ICD-10-CM

## 2016-12-29 DIAGNOSIS — Z01818 Encounter for other preprocedural examination: Secondary | ICD-10-CM

## 2016-12-29 DIAGNOSIS — I350 Nonrheumatic aortic (valve) stenosis: Secondary | ICD-10-CM

## 2016-12-29 DIAGNOSIS — K0889 Other specified disorders of teeth and supporting structures: Secondary | ICD-10-CM

## 2016-12-29 MED ORDER — CHLORHEXIDINE GLUCONATE 0.12 % MT SOLN: OROMUCOSAL | 99 refills | Status: DC

## 2016-12-29 MED ORDER — ENOXAPARIN (LOVENOX) PATIENT EDUCATION KIT
1.0000 | PACK | Freq: Once | 0 refills | Status: AC
Start: 2016-12-29 — End: 2016-12-29

## 2016-12-29 MED ORDER — ENOXAPARIN SODIUM 80 MG/0.8ML ~~LOC~~ SOLN: 80.0000 mg | SUBCUTANEOUS | 0 refills | Status: DC

## 2016-12-29 NOTE — Progress Notes (Signed)
I tried to call her over weekend but no answer.

## 2016-12-29 NOTE — Patient Instructions (Signed)
[place]    Department of Dental Medicine     DR. Mikale Silversmith      HEART VALVES AND MOUTH CARE:  FACTS:   If you have any infection in your mouth, it can infect your heart valve.  If you heart valve is infected, you will be seriously ill.  Infections in the mouth can be SILENT and do not always cause pain.  Examples of infections in the mouth are gum disease, dental cavities, and abscesses.  Some possible signs of infection are: Bad breath, bleeding gums, or teeth that are sensitive to sweets, hot, and/or cold. There are many other signs as well.  WHAT YOU HAVE TO DO:   Brush your teeth after meals and at bedtime. Spend at least 2 minutes brushing well, especially behind your back teeth and all around your teeth that stand alone. Brush at the gumline also.  Do not go to bed without brushing your teeth and flossing.  If you gums bleed when you brush or floss, do NOT stop brushing or flossing. It usually means that your gums need more attention and better cleaning.   If your Dentist or Dr. Chaysen Tillman gave you a prescription mouthwash to use, make sure to use it as directed. If you run out of the medication, get a refill at the pharmacy.   If you were given any other medications or directions by your Dentist, please follow them. If you did not understand the directions or forget what you were told, please call. We will be happy to refresh her memory.  If you need antibiotics before dental procedures, make sure you take them one hour prior to every dental visit as directed.   Get a dental checkup every 4-6 months in order to keep your mouth healthy, or to find and treat any new infection. You will most likely need your teeth cleaned or gums treated at the same time.  If you are not able to come in for your scheduled appointment, call your Dentist as soon as possible to reschedule.  If you have a problem in between dental visits, call your Dentist.  

## 2016-12-29 NOTE — Progress Notes (Signed)
DENTAL CONSULTATION  Date of Consultation:  12/29/2016 Patient Name:   Christine Hicks Date of Birth:   09/13/1955 Medical Record Number: [id number]  VITALS: BP (!) 147/39 (BP Location: Left Arm)   Pulse 64   Temp 97.6 F (36.4 C) (Oral)   LMP  (LMP Unknown)   CHIEF COMPLAINT: Patient referred by Dr. Owen for dental consultation.  HPI: Christine Hicks is a 61-year-old female recently diagnosed with critical aortic stenosis. Patient with anticipated aortic valve replacement with Dr. Owen for June 7th due to the critical nature of the heart valve disease. Patient now seen as part of a pre-heart valve surgery dental protocol examination to rule out dental infection that may affect the patient's systemic health and anticipated heart valve surgery.  The patient currently denies acute toothaches, swellings, or abscesses. Patient has had some cold sensitivity associated with the lower right canine #27 that started to be sensitive approximately one to 2 days ago. Patient describes the discomfort as lasting for seconds only.  The patient has not seen a dentist for 4 years. Patient saw Dr. Stanley Allen at that time. Patient was evaluated with an exam and radiographs at that time.  She was being referred to an oral surgeon for extractions with possible implants placement. Patient did not follow-up with that treatment due to problems with her hernia repair surgery. Patient has a history of maxillary and mandibular acrylic partial dentures fabricated by Dr. Sessoms approximately 7-10 years ago. These partial dentures no longer fit well secondary to loss of abutment teeth that have been broken off at the gum line. 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   PROBLEM LIST: Patient Active Problem List   Diagnosis Date Noted  . Critical aortic valve stenosis 12/11/2016    Priority: High  . Acute on chronic diastolic congestive heart failure (HCC)   . Acute on chronic diastolic heart failure (HCC)  12/11/2016  . Aortic stenosis, severe 12/04/2015  . Heme + stool   . Anemia, iron deficiency   . Symptomatic anemia 11/11/2015  . Bicuspid aortic valve 11/11/2015  . Dyspnea 11/11/2015  . Prolonged QT interval 11/11/2015  . Hypothyroidism 09/24/2009  . Essential hypertension 09/24/2009  . DVT, recurrent, lower extremity, chronic, bilateral (HCC) 09/24/2009  . CELLULITIS, LEG, RIGHT 09/24/2009    PMH: Past Medical History:  Diagnosis Date  . Acute on chronic diastolic congestive heart failure (HCC)   . Anemia, iron deficiency 12/11/2016   negative egd/colonoscopy 11/16/2015  . Aortic stenosis, severe   . Bicuspid aortic valve 12/11/2016  . Hypertension 12/11/2016  . Thyroid disease 12/11/2016    PSH: Past Surgical History:  Procedure Laterality Date  . 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 COLONOSCOPY N/A 11/16/2015   Procedure: COLONOSCOPY;  Surgeon: Carl E Gessner, MD;  Location: [facility];  Service: Endoscopy;  Laterality: N/A;  . ESOPHAGOGASTRODUODENOSCOPY N/A 11/16/2015   Procedure: ESOPHAGOGASTRODUODENOSCOPY (EGD);  Surgeon: Carl E Gessner, MD;  Location: [facility];  Service: Endoscopy;  Laterality: N/A;  . HERNIA REPAIR    . Right leg cellulitis surgery      ALLERGIES: No Known Allergies  MEDICATIONS: Current Outpatient Prescriptions  Medication Sig Dispense Refill  . amLODipine (NORVASC) 5 MG tablet Take 1 tablet (5 mg total) by mouth daily. 180 tablet 3  . enoxaparin (LOVENOX) 80 MG/0.8ML injection Inject 0.8 mLs (80 mg total) into the skin daily. INJECT 12/29/16 AND 12/30/16 2 Syringe 0  . enoxaparin (LOVENOX)   KIT 1 kit by Does not apply route once. 1 kit 0  . ferrous sulfate 325 (65 FE) MG tablet Take 1 tablet (325 mg total) by mouth 2 (two) times daily with a meal. 60 tablet 0  . furosemide (LASIX) 40 MG tablet Take 1 tablet (40 mg total) by mouth 2 (two) times daily. 90 tablet 3  . levothyroxine (SYNTHROID, LEVOTHROID) 150  MCG tablet Take 1 tablet (150 mcg total) by mouth daily before breakfast. 30 tablet 2  . pantoprazole (PROTONIX) 40 MG tablet TAKE 1 TABLET (40 MG TOTAL) BY MOUTH DAILY. 30 tablet 6  . potassium chloride (KLOR-CON M10) 10 MEQ tablet Take 1 tablet (10 mEq total) by mouth daily. (Patient taking differently: Take 10 mEq by mouth daily with lunch. ) 90 tablet 3   No current facility-administered medications for this visit.     LABS: Lab Results  Component Value Date   WBC 6.5 12/15/2016   HGB 14.5 12/15/2016   HCT 44.8 12/15/2016   MCV 96.8 12/15/2016   PLT 156 12/15/2016      Component Value Date/Time   NA 139 12/26/2016 1116   K 4.0 12/26/2016 1116   CL 99 12/26/2016 1116   CO2 24 12/26/2016 1116   GLUCOSE 92 12/26/2016 1116   GLUCOSE 99 12/17/2016 1120   BUN 10 12/26/2016 1116   CREATININE 0.76 12/26/2016 1116   CREATININE 0.93 12/04/2015 1000   CALCIUM 9.5 12/26/2016 1116   GFRNONAA 84 12/26/2016 1116   GFRAA 97 12/26/2016 1116   Lab Results  Component Value Date   INR 1.45 11/13/2015   INR 1.34 11/12/2015   INR 1.18 11/10/2015   No results found for: PTT  SOCIAL HISTORY: Social History   Social History  . Marital status: Significant Other    Spouse name: N/A  . Number of children: N/A  . Years of education: N/A   Occupational History  . Not on file.   Social History Main Topics  . Smoking status: Former Research scientist (life sciences)  . Smokeless tobacco: Never Used  . Alcohol use No  . Drug use: No  . Sexual activity: Yes   Other Topics Concern  . Not on file   Social History Narrative  . No narrative on file    FAMILY HISTORY: Family History  Problem Relation Age of Onset  . Cardiomyopathy Son   . Heart attack Mother   . Emphysema Father   . Lung cancer Maternal Aunt     REVIEW OF SYSTEMS: Reviewed with the patient as per History of present illness. Psych: Patient Does have dental phobia.   DENTAL HISTORY: CHIEF COMPLAINT: Patient referred by Dr. Roxy Manns for  dental consultation.  HPI: Christine Hicks is a 61 year old female recently diagnosed with critical aortic stenosis. Patient with anticipated aortic valve replacement with Dr. Roxy Manns for June 7 due to the critical nature of the heart valve disease. Patient now seen as part of a pre-heart valve surgery dental protocol examination to rule out dental infection that may affect the patient's systemic health and anticipated heart valve surgery.  The patient currently denies acute toothaches, swellings, or abscesses. Patient has had some cold sensitivity associated with the lower right canine #27 that started to be sensitive approximately one to 2 days ago. Patient describes the discomfort as lasting for seconds only.  The patient has not seen a dentist for 4 years. Patient saw Dr. Serita Kyle at that time. Patient was evaluated with an exam and radiographs at that time.  She was being referred to an oral surgeon for extractions with possible implants placement. Patient did not follow-up with that treatment due to problems with her hernia repair surgery. Patient has a history of maxillary and mandibular acrylic partial dentures fabricated by Dr. Sessoms approximately 7-10 years ago. These partial dentures no longer fit well secondary to loss of abutment teeth that have been broken off at the gum line. 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   DENTAL EXAMINATION: GENERAL: Patient is a well-developed, obese female in no acute distress. HEAD AND NECK: There is no palpable neck lymphadenopathy. The patient denies acute TMJ symptoms. INTRAORAL EXAM: Patient has normal saliva. There is no evidence of oral abscess formation. There is atrophy of the edentulous alveolar ridges. DENTITION: Patient is missing tooth numbers 1, 2, 4, 5, 8 through 16, 17-20, and 29 through 32. There may be a retained root in the area of #32 that is encased in bone. PERIODONTAL: Patient has chronic periodontitis with plaque and  calculus accumulations, generalized gingival recession, and tooth mobility as charted. There is incipient to moderate bone loss noted. DENTAL CARIES/SUBOPTIMAL RESTORATIONS: Multiple dental caries are noted as per dental charting form. ENDODONTIC: Patient currently denies having irreversible pulpitis symptoms. Patient does have multiple areas of periapical pathology, however. CROWN AND BRIDGE: No crown or bridge restorations. PROSTHODONTIC: Patient has maxillary and mandibular acrylic partial dentures that are ill fitting. OCCLUSION: Patient has a poor occlusal scheme secondary to multiple missing teeth, multiple retained root segments, and lack of wasn't of missing teeth with clinically acceptable dental prostheses.  RADIOGRAPHIC INTERPRETATION: Orthopantogram was taken and supplemented with 11 periapical radiographs. There are multiple missing teeth. There multiple retained root segments. There is incipient to moderate bone loss. There is supra-eruption and drifting of the unopposed teeth into the edentulous areas. There are multiple dental caries noted. There is periapical pathology associated with multiple retained root segments. There is atrophy of the edentulous alveolar ridges noted.   ASSESSMENTS: 1. Critical aortic stenosis  2. Preaortic valve replacement 3. Chronic apical periodontitis 4. Multiple retained root segments 5. Multiple dental caries 6. Chronic periodontitis with bone loss 7. Accretions 8. Gingival recession 9. Tooth mobility 10. Multiple missing teeth 11. Ill fitting maxillary mandibular acrylic partial dentures 12. Malocclusion 13. Dental phobia 14. Risk for bleeding with invasive dental procedures due to previous Eliquis therapy and current Lovenox therapy 15. Risk for complications up to and including death with anticipated invasive dental procedures in the operating room with general anesthesia.   PLAN/RECOMMENDATIONS: 1. I discussed the risks, benefits, and  complications of various treatment options with the patient in relationship to her medical and dental conditions, anticipated heart valve surgery, and risk for endocarditis. We discussed various treatment options to include no treatment, extraction of remaining teeth with alveoloplasty in the operating room with general anesthesia, pre-prosthetic surgery as indicated, periodontal therapy, dental restorations, root canal therapy, crown and bridge therapy, implant therapy, and replacement of missing teeth as indicated. We also discussed provision of dental treatment after her anticipated heart valve surgery due to her current critical aortic stenosis as per discussion with Dr. Owen, cardiothoracic surgeon. The patient currently wishes to proceed with the aortic valve replacement surgery scheduled with Dr. Owen for this Thursday, 01/01/2017.  The patient will then be referred back to Dental Medicine for extraction of remaining teeth with alveoloplasty in the operating room with general anesthesia approximately 2-3 months after her heart valve surgery. In the meantime, the patient is to   use chlorhexidine rinses twice daily as prescribed to help disinfect the oral cavity until dental procedures can be provided. Patient is to contact Dental Medicine or Dr. Owen if acute problems arise before then.   2. Discussion of findings with medical team and coordination of future medical and dental care as needed.  I spent in excess of 120 minutes during the conduct of this consultation and >50% of this time involved direct face-to-face encounter for counseling and/or coordination of the patient's care.     F. , DDS 

## 2016-12-30 ENCOUNTER — Encounter (HOSPITAL_COMMUNITY)
Admission: RE | Admit: 2016-12-30 | Discharge: 2016-12-30 | Disposition: A | Discharge status: Home / Self Care | Payer: BC Managed Care – PPO | Source: Ambulatory Visit | Attending: Thoracic Surgery (Cardiothoracic Vascular Surgery) | Admitting: Thoracic Surgery (Cardiothoracic Vascular Surgery)

## 2016-12-30 ENCOUNTER — Ambulatory Visit (HOSPITAL_COMMUNITY)
Admission: RE | Admit: 2016-12-30 | Discharge: 2016-12-30 | Disposition: A | Discharge status: Home / Self Care | Payer: BC Managed Care – PPO | Source: Ambulatory Visit | Attending: Thoracic Surgery (Cardiothoracic Vascular Surgery) | Admitting: Thoracic Surgery (Cardiothoracic Vascular Surgery)

## 2016-12-30 ENCOUNTER — Encounter (HOSPITAL_COMMUNITY): Payer: Self-pay

## 2016-12-30 DIAGNOSIS — I350 Nonrheumatic aortic (valve) stenosis: Secondary | ICD-10-CM | POA: Diagnosis not present

## 2016-12-30 DIAGNOSIS — Z01818 Encounter for other preprocedural examination: Secondary | ICD-10-CM | POA: Diagnosis not present

## 2016-12-30 DIAGNOSIS — Z01812 Encounter for preprocedural laboratory examination: Secondary | ICD-10-CM | POA: Insufficient documentation

## 2016-12-30 HISTORY — DX: Nausea with vomiting, unspecified: R11.2

## 2016-12-30 HISTORY — DX: Presence of spectacles and contact lenses: Z97.3

## 2016-12-30 HISTORY — DX: Gastro-esophageal reflux disease without esophagitis: K21.9

## 2016-12-30 HISTORY — DX: Insomnia, unspecified: G47.00

## 2016-12-30 HISTORY — DX: Unspecified osteoarthritis, unspecified site: M19.90

## 2016-12-30 HISTORY — DX: Other specified postprocedural states: Z98.890

## 2016-12-30 HISTORY — DX: Cardiac murmur, unspecified: R01.1

## 2016-12-30 HISTORY — DX: Morbid (severe) obesity due to excess calories: E66.01

## 2016-12-30 HISTORY — DX: Acute embolism and thrombosis of unspecified deep veins of unspecified lower extremity: I82.409

## 2016-12-30 HISTORY — DX: Body Mass Index (BMI) 40.0 and over, adult: Z684

## 2016-12-30 HISTORY — DX: Presence of dental prosthetic device (complete) (partial): Z97.2

## 2016-12-30 LAB — COMPREHENSIVE METABOLIC PANEL
ALBUMIN: 3.8 g/dL (ref 3.5–5.0)
ALK PHOS: 74 U/L (ref 38–126)
ALT: 23 U/L (ref 14–54)
ANION GAP: 9 (ref 5–15)
AST: 31 U/L (ref 15–41)
BILIRUBIN TOTAL: 0.8 mg/dL (ref 0.3–1.2)
BUN: 14 mg/dL (ref 6–20)
CALCIUM: 9.4 mg/dL (ref 8.9–10.3)
CO2: 23 mmol/L (ref 22–32)
CREATININE: 0.82 mg/dL (ref 0.44–1.00)
Chloride: 107 mmol/L (ref 101–111)
GFR calc non Af Amer: >60 mL/min (ref >60)
GLUCOSE: 103 mg/dL — AB (ref 65–99)
Potassium: 4 mmol/L (ref 3.5–5.1)
SODIUM: 139 mmol/L (ref 135–145)
TOTAL PROTEIN: 7.4 g/dL (ref 6.5–8.1)

## 2016-12-30 LAB — BLOOD GAS, ARTERIAL
Acid-Base Excess: 0.1 mmol/L (ref 0.0–2.0)
Bicarbonate: 24 mmol/L (ref 20.0–28.0)
Drawn by: 421801
FIO2: 21
O2 Saturation: 95.1 %
Patient temperature: 98.6
pCO2 arterial: 38 mmHg (ref 32.0–48.0)
pH, Arterial: 7.418 (ref 7.350–7.450)
pO2, Arterial: 78.7 mmHg — ABNORMAL LOW (ref 83.0–108.0)

## 2016-12-30 LAB — CBC
HEMATOCRIT: 47 % — AB (ref 36.0–46.0)
HEMOGLOBIN: 15.2 g/dL — AB (ref 12.0–15.0)
MCH: 31.3 pg (ref 26.0–34.0)
MCHC: 32.3 g/dL (ref 30.0–36.0)
MCV: 96.7 fL (ref 78.0–100.0)
Platelets: 175 10*3/uL (ref 150–400)
RBC: 4.86 MIL/uL (ref 3.87–5.11)
RDW: 13.6 % (ref 11.5–15.5)
WBC: 7.1 10*3/uL (ref 4.0–10.5)

## 2016-12-30 LAB — URINALYSIS, ROUTINE W REFLEX MICROSCOPIC
BILIRUBIN URINE: NEGATIVE
Bacteria, UA: NONE SEEN
GLUCOSE, UA: NEGATIVE mg/dL
HGB URINE DIPSTICK: NEGATIVE
KETONES UR: NEGATIVE mg/dL
NITRITE: NEGATIVE
Protein, ur: NEGATIVE mg/dL
SPECIFIC GRAVITY, URINE: 1.012 (ref 1.005–1.030)
pH: 6 (ref 5.0–8.0)

## 2016-12-30 LAB — APTT: aPTT: 30 seconds (ref 24–36)

## 2016-12-30 LAB — SURGICAL PCR SCREEN
MRSA, PCR: NEGATIVE
Staphylococcus aureus: POSITIVE — AB

## 2016-12-30 LAB — PROTIME-INR
INR: 1.19
Prothrombin Time: 15.2 seconds (ref 11.4–15.2)

## 2016-12-30 NOTE — Pre-Procedure Instructions (Signed)
Christine PattenJudith M Hicks  12/30/2016      [facility] [place] - [id number] AT [facility] [address] [place] [id number] Phone: [phone] Fax: [phone]  [id number] [place] , [place] - [address] [facility] [id number] [place] [id number] Phone: [phone] Fax: [phone]    Your procedure is scheduled on Thursday, January 01, 2017  [facility] at 5:30 A.M.  Call this number if you have problems the morning of surgery:  [phone]   Remember:  Do not eat food or drink liquids after midnight Wednesday, June 6  Take these medicines the morning of surgery with A SIP OF WATER : amLODipine (NORVASC), levothyroxine (SYNTHROID ), pantoprazole (PROTONIX)  Stop taking vitamins, fish oil and herbal medications. Do not take any NSAIDs ie: Ibuprofen, Advil, Naproxen, BC and Goody Powder; stop now.   Do not wear jewelry, make-up or nail polish.  Do not wear lotions, powders, or perfumes, or deoderant.  Do not shave 48 hours prior to surgery.    Do not bring valuables to the hospital.  [facility] is not responsible for any belongings or valuables.  Contacts, dentures or bridgework may not be worn into surgery.  Leave your suitcase in the car.  After surgery it may be brought to your room.  For patients admitted to the hospital, discharge time will be determined by your treatment team. Special instructions:   [place] - Preparing for Surgery  Before surgery, you can play an important role.  Because skin is not sterile, your skin needs to be as free of germs as possible.  You can reduce the number of germs on you skin by washing with CHG (chlorahexidine gluconate) soap before surgery.  CHG is an antiseptic cleaner which kills germs and bonds with the skin to continue killing germs even after washing.  Please DO NOT use if you have an  allergy to CHG or antibacterial soaps.  If your skin becomes reddened/irritated stop using the CHG and inform your nurse when you arrive at Short Stay.  Do not shave (including legs and underarms) for at least 48 hours prior to the first CHG shower.  You may shave your face.  Please follow these instructions carefully:   1.  Shower with CHG Soap the night before surgery and the morning of Surgery.  2.  If you choose to wash your hair, wash your hair first as usual with your normal shampoo.  3.  After you shampoo, rinse your hair and body thoroughly to remove the Shampoo.  4.  Use CHG as you would any other liquid soap.  You can apply chg directly  to the skin and wash gently with scrungie or a clean washcloth.  5.  Apply the CHG Soap to your body ONLY FROM THE NECK DOWN.  Do not use on open wounds or open sores.  Avoid contact with your eyes, ears, mouth and genitals (private parts).  Wash genitals (private parts) with your normal soap.  6.  Wash thoroughly, paying special attention to the area where your surgery will be performed.  7.  Thoroughly rinse your body with warm water from the neck down.  8.  DO NOT shower/wash with your normal soap after using and rinsing off the CHG Soap.  9.  Pat yourself dry with a clean towel.  10.  Wear clean pajamas.            11.  Place clean sheets on your bed the night of your first shower and do not sleep with pets.  Day of Surgery  Do not apply any lotions/deoderants the morning of surgery.  Please wear clean clothes to the hospital/surgery center.  Please read over the following fact sheets that you were given. Pain Booklet, Coughing and Deep Breathing, Blood Transfusion Information, MRSA Information and Surgical Site Infection Prevention

## 2016-12-30 NOTE — Progress Notes (Signed)
Pt denies any acute cardiopulmonary issues. Pt under the care of Dr. Katrinka BlazingSmith, Cardiology. Pt denies history of sleep apnea. Please repeat type and screen on DOS (+ antibodies). Pt chart forwarded to anesthesia for review.

## 2016-12-30 NOTE — Progress Notes (Signed)
Pt denies having a stress test. 

## 2016-12-31 ENCOUNTER — Encounter (HOSPITAL_COMMUNITY): Payer: Self-pay | Admitting: Certified Registered Nurse Anesthetist

## 2016-12-31 ENCOUNTER — Ambulatory Visit: Payer: BC Managed Care – PPO | Admitting: Cardiovascular Disease

## 2016-12-31 ENCOUNTER — Ambulatory Visit (HOSPITAL_COMMUNITY)
Admission: RE | Admit: 2016-12-31 | Discharge: 2016-12-31 | Disposition: A | Discharge status: Home / Self Care | Payer: Medicare Other | Source: Ambulatory Visit | Attending: Thoracic Surgery (Cardiothoracic Vascular Surgery) | Admitting: Thoracic Surgery (Cardiothoracic Vascular Surgery)

## 2016-12-31 ENCOUNTER — Encounter (HOSPITAL_COMMUNITY): Payer: Self-pay

## 2016-12-31 DIAGNOSIS — I350 Nonrheumatic aortic (valve) stenosis: Secondary | ICD-10-CM

## 2016-12-31 LAB — PULMONARY FUNCTION TEST
DL/VA % pred: 80 %
DL/VA: 3.98 ml/min/mmHg/L
DLCO cor % pred: 42 %
DLCO cor: 10.85 ml/min/mmHg
DLCO unc % pred: 44 %
DLCO unc: 11.4 ml/min/mmHg
FEF 25-75 Post: 1.05 L/sec
FEF 25-75 Pre: 0.95 L/sec
FEF2575-%Change-Post: 10 %
FEF2575-%Pred-Post: 50 %
FEF2575-%Pred-Pre: 46 %
FEV1-%CHANGE-POST: 0 %
FEV1-%PRED-PRE: 63 %
FEV1-%Pred-Post: 63 %
FEV1-POST: 1.35 L
FEV1-Pre: 1.35 L
FEV1FVC-%CHANGE-POST: -1 %
FEV1FVC-%Pred-Pre: 94 %
FEV6-%Change-Post: 1 %
FEV6-%PRED-PRE: 68 %
FEV6-%Pred-Post: 69 %
FEV6-PRE: 1.8 L
FEV6-Post: 1.83 L
FEV6FVC-%PRED-PRE: 103 %
FEV6FVC-%Pred-Post: 103 %
FVC-%CHANGE-POST: 1 %
FVC-%PRED-POST: 67 %
FVC-%PRED-PRE: 66 %
FVC-POST: 1.83 L
FVC-PRE: 1.8 L
POST FEV6/FVC RATIO: 100 %
PRE FEV6/FVC RATIO: 100 %
Post FEV1/FVC ratio: 74 %
Pre FEV1/FVC ratio: 75 %
RV % pred: 96 %
RV: 2 L
TLC % PRED: 71 %
TLC: 3.7 L

## 2016-12-31 LAB — HEMOGLOBIN A1C
HEMOGLOBIN A1C: 5.5 % (ref 4.8–5.6)
MEAN PLASMA GLUCOSE: 111 mg/dL

## 2016-12-31 MED ORDER — LIDOCAINE HCL (CARDIAC) 20 MG/ML IV SOLN
260.0000 mg | Freq: Once | INTRAVENOUS | Status: DC
  Filled 2016-12-31: qty 13

## 2016-12-31 MED ORDER — DOPAMINE-DEXTROSE 3.2-5 MG/ML-% IV SOLN
0.0000 ug/kg/min | INTRAVENOUS | Status: DC
  Filled 2016-12-31: qty 250

## 2016-12-31 MED ORDER — MANNITOL 20% IV SOLUTION 10G/50ML
6.4000 g | INTRAVENOUS | Status: DC
  Filled 2016-12-31: qty 60

## 2016-12-31 MED ORDER — EPINEPHRINE PF 1 MG/ML IJ SOLN
0.0000 ug/min | INTRAVENOUS | Status: DC
  Filled 2016-12-31: qty 4

## 2016-12-31 MED ORDER — NITROGLYCERIN IN D5W 200-5 MCG/ML-% IV SOLN
2.0000 ug/min | INTRAVENOUS | Status: AC
  Administered 2017-01-01: 3 ug/min via INTRAVENOUS
  Filled 2016-12-31: qty 250

## 2016-12-31 MED ORDER — MAGNESIUM SULFATE 50 % IJ SOLN
40.0000 meq | INTRAMUSCULAR | Status: DC
  Filled 2016-12-31: qty 10

## 2016-12-31 MED ORDER — SODIUM CHLORIDE 0.9 % IV SOLN
30.0000 ug/min | INTRAVENOUS | Status: AC
  Administered 2017-01-01: 25 ug/min via INTRAVENOUS
  Filled 2016-12-31: qty 2

## 2016-12-31 MED ORDER — CEFUROXIME SODIUM 1.5 G IV SOLR
1.5000 g | INTRAVENOUS | Status: AC
  Administered 2017-01-01: .75 g via INTRAVENOUS
  Administered 2017-01-01: 1.5 g via INTRAVENOUS
  Filled 2016-12-31: qty 1.5

## 2016-12-31 MED ORDER — CHLORHEXIDINE GLUCONATE 0.12 % MT SOLN
15.0000 mL | OROMUCOSAL | Status: AC
  Administered 2017-01-01: 15 mL via OROMUCOSAL
  Filled 2016-12-31: qty 15

## 2016-12-31 MED ORDER — SODIUM CHLORIDE 0.9 % IV SOLN
INTRAVENOUS | Status: AC
  Administered 2017-01-01: 1.2 [IU]/h via INTRAVENOUS
  Filled 2016-12-31: qty 1

## 2016-12-31 MED ORDER — DEXTROSE 5 % IV SOLN
750.0000 mg | INTRAVENOUS | Status: DC
  Filled 2016-12-31: qty 750

## 2016-12-31 MED ORDER — TRANEXAMIC ACID (OHS) PUMP PRIME SOLUTION
2.0000 mg/kg | INTRAVENOUS | Status: DC
  Filled 2016-12-31: qty 2.49

## 2016-12-31 MED ORDER — VANCOMYCIN HCL 10 G IV SOLR
1500.0000 mg | INTRAVENOUS | Status: AC
  Administered 2017-01-01: 1500 mg via INTRAVENOUS
  Filled 2016-12-31: qty 1500

## 2016-12-31 MED ORDER — VANCOMYCIN HCL 1000 MG IV SOLR
INTRAVENOUS | Status: AC
  Administered 2017-01-01: 1000 mL
  Filled 2016-12-31: qty 1000

## 2016-12-31 MED ORDER — SODIUM CHLORIDE 0.9 % IV SOLN
INTRAVENOUS | Status: DC
  Filled 2016-12-31: qty 30

## 2016-12-31 MED ORDER — PLASMA-LYTE 148 IV SOLN
INTRAVENOUS | Status: AC
  Administered 2017-01-01: 500 mL
  Filled 2016-12-31: qty 2.5

## 2016-12-31 MED ORDER — DEXMEDETOMIDINE HCL IN NACL 400 MCG/100ML IV SOLN
0.1000 ug/kg/h | INTRAVENOUS | Status: AC
  Administered 2017-01-01: .2 ug/kg/h via INTRAVENOUS
  Filled 2016-12-31 (×2): qty 100

## 2016-12-31 MED ORDER — METOPROLOL TARTRATE 12.5 MG HALF TABLET
12.5000 mg | ORAL_TABLET | ORAL | Status: AC
  Administered 2017-01-01: 12.5 mg via ORAL
  Filled 2016-12-31: qty 1

## 2016-12-31 MED ORDER — POTASSIUM CHLORIDE 2 MEQ/ML IV SOLN
80.0000 meq | INTRAVENOUS | Status: DC
  Filled 2016-12-31: qty 40

## 2016-12-31 MED ORDER — TRANEXAMIC ACID (OHS) BOLUS VIA INFUSION
15.0000 mg/kg | INTRAVENOUS | Status: AC
  Administered 2017-01-01: 1870.5 mg via INTRAVENOUS
  Filled 2016-12-31: qty 1871

## 2016-12-31 MED ORDER — TRANEXAMIC ACID 1000 MG/10ML IV SOLN
1.5000 mg/kg/h | INTRAVENOUS | Status: AC
  Administered 2017-01-01: 1.5 mg/kg/h via INTRAVENOUS
  Filled 2016-12-31: qty 25

## 2016-12-31 MED ORDER — ALBUTEROL SULFATE (2.5 MG/3ML) 0.083% IN NEBU
2.5000 mg | INHALATION_SOLUTION | Freq: Once | RESPIRATORY_TRACT | Status: AC
  Administered 2016-12-31: 2.5 mg via RESPIRATORY_TRACT

## 2016-12-31 NOTE — Progress Notes (Signed)
Pre-op Cardiac Surgery  Carotid Findings:   Findings are consistent with a 1-39 percent stenosis involving the right internal carotid artery and the left internal carotid artery. The vertebral arteries demonstrate antegrade flow.  Upper Extremity Right Left  Brachial Pressures 193  Triphasic 219  Triphasic  Radial Waveforms Triphasic Triphasic  Ulnar Waveforms Triphasic Biphasic  Palmar Arch (Allen's Test) Palmar waveforms are obliterated with radial compression and are decreased greater than fifty percent with ulnar compression. Palmar waveforms are obliterated with radial compression and are decreased greater than fifty percent with ulnar compression.    12/31/16 11:54 AM Olen CordialGreg Keenan Trefry RVT

## 2016-12-31 NOTE — Anesthesia Preprocedure Evaluation (Addendum)
Anesthesia Evaluation  Patient identified by MRN, date of birth, ID band Patient awake    Reviewed: Allergy & Precautions, NPO status , Patient's Chart, lab work & pertinent test results  History of Anesthesia Complications (+) PONV and history of anesthetic complications  Airway Mallampati: III  TM Distance: >3 FB Neck ROM: Full    Dental  (+) Poor Dentition, Dental Advisory Given   Pulmonary former smoker,    Pulmonary exam normal        Cardiovascular hypertension, + Peripheral Vascular Disease   Rhythm:Regular + Systolic murmurs Study Conclusions  - Left ventricle: The cavity size was normal. Wall thickness was   increased in a pattern of moderate LVH. Systolic function was   normal. The estimated ejection fraction was in the range of 55%   to 60%. Wall motion was normal; there were no regional wall   motion abnormalities. Doppler parameters are consistent with   abnormal left ventricular relaxation (grade 1 diastolic   dysfunction). - Aortic valve: There was severe stenosis. Mean gradient (S): 82 mm   Hg. Valve area (VTI): 0.42 cm^2. Valve area (Vmean): 0.43 cm^2. - Left atrium: The atrium was mildly dilated. - Right atrium: The atrium was mildly dilated. - Tricuspid valve: There was moderate regurgitation. - Pulmonary arteries: Systolic pressure was mildly to moderately   increased. PA peak pressure: 45 mm Hg (S).  Conclusion    Critical aortic stenosis, on a bicuspid aortic valve with documented greater than 120 mm peak to peak and 85 mm mean gradient by recent echo.  Normal coronary arteries.  Normal pulmonary pressures. The capillary wedge pressure is 14 mmHg mean.   Acute on chronic diastolic heart failure, resolved after inpatient diuresis over the past 4 days.      Neuro/Psych negative neurological ROS  negative psych ROS   GI/Hepatic Neg liver ROS, GERD  ,  Endo/Other  Hypothyroidism Morbid  obesity  Renal/GU negative Renal ROS     Musculoskeletal   Abdominal   Peds  Hematology negative hematology ROS (+)   Anesthesia Other Findings   Reproductive/Obstetrics                           Anesthesia Physical Anesthesia Plan  ASA: IV  Anesthesia Plan: General   Post-op Pain Management:    Induction:   PONV Risk Score and Plan: 4 or greater and Ondansetron, Dexamethasone, Propofol, Midazolam, Scopolamine patch - Pre-op and Treatment may vary due to age  Airway Management Planned: Double Lumen EBT  Additional Equipment: PA Cath, Ultrasound Guidance Line Placement, 3D TEE and Arterial line  Intra-op Plan: Utilization of Controlled Hypotension per surrgeon request  Post-operative Plan: Post-operative intubation/ventilation  Informed Consent: I have reviewed the patients History and Physical, chart, labs and discussed the procedure including the risks, benefits and alternatives for the proposed anesthesia with the patient or authorized representative who has indicated his/her understanding and acceptance.   Dental advisory given  Plan Discussed with: CRNA and Anesthesiologist  Anesthesia Plan Comments:        Anesthesia Quick Evaluation

## 2016-12-31 NOTE — Progress Notes (Signed)
Anesthesia chart review: Patient is a 61 year old female scheduled for minimally invasive aortic valve replacement, possible aortic root enlargement or aortic root replacement on 01/01/17 by Dr. Cornelius Moraswen.   History includes former smoker, post-operative N/V, bicuspid aortic valve, severe aortic stenosis, chronic diastolic CHF, hypertension, iron deficiency anemia, hypothyroidism, RLE DVT (recurrent episode) 11/12/15, GERD. BMI is consistent with morbid obesity.   PCP is Dr. Billee CashingWayland McKenzie.  Cardiologist is Dr. Verdis PrimeHenry Smith. Per dental consult note, patient will have dental extractions 2-3 months following AVR.   BP (!) 143/78   Pulse 80   Temp 36.7 C   Resp 18   Ht 5\' 5"  (1.651 m)   Wt 275 lb (124.7 kg)   LMP  (LMP Unknown)   SpO2 100%   BMI 45.76 kg/m    Meds include amlodipine, ferrous sulfate, Lasix, levothyroxine, Protonix, KCl. Eliquis held following 12/26/16 visit with Dr. Cornelius Moraswen. Lovenox injections 12/29/16-12/30/16.  EKG 12/26/16: NSR, right BBB, T wave abnormality, consider inferior ischemia.  Cardiac cath 12/16/16:   Critical aortic stenosis, on a bicuspid aortic valve with documented greater than 120 mm peak to peak and 85 mm mean gradient by recent echo.  Normal coronary arteries.  Normal pulmonary pressures. The capillary wedge pressure is 14 mmHg mean.   Acute on chronic diastolic heart failure, resolved after inpatient diuresis over the past 4 days. RECOMMENDATIONS:  Heart valve team workup followed by TAVR (preferrable) vs SAVR.  Resolution of heart failure has occurred and recommend starting furosemide 40 mg by mouth daily.  Home in a.m. if no postprocedure complications.  Echo 11/20/16: Study Conclusions - Left ventricle: The cavity size was normal. Wall thickness was   increased in a pattern of moderate LVH. Systolic function was   normal. The estimated ejection fraction was in the range of 55%   to 60%. Wall motion was normal; there were no regional wall   motion  abnormalities. Doppler parameters are consistent with   abnormal left ventricular relaxation (grade 1 diastolic   dysfunction). - Aortic valve: There was severe stenosis. Mean gradient (S): 82 mm   Hg. Valve area (VTI): 0.42 cm^2. Valve area (Vmean): 0.43 cm^2. - Left atrium: The atrium was mildly dilated. - Right atrium: The atrium was mildly dilated. - Tricuspid valve: There was moderate regurgitation. - Pulmonary arteries: Systolic pressure was mildly to moderately   increased. PA peak pressure: 45 mm Hg (S).  Carotid U/S 12/31/16 (Preliminary): Carotid Findings:   Findings are consistent with a 1-39 percent stenosis involving the right internal carotid artery and the left internal carotid artery. The vertebral arteries demonstrate antegrade flow.  PFTs 12/31/16: FVC 1.80 (66%), FEV1 1.35 (63%), DLCO unc 11.40 (44%).  Preoperative CXR and labs noted. Per Blood Bank, needs recollection of blood sample on the day of surgery due to antibodies.  If no acute changes then I anticipate that she can proceed as planned.  Velna Ochsllison Joyce Heitman, PA-C [facility] Short Stay Center/Anesthesiology Phone [phone] 12/31/2016 12:12 PM

## 2017-01-01 ENCOUNTER — Encounter (HOSPITAL_COMMUNITY)
Admission: RE | Disposition: A | Discharge status: Home Health | Payer: Self-pay | Source: Ambulatory Visit | Attending: Thoracic Surgery (Cardiothoracic Vascular Surgery)

## 2017-01-01 ENCOUNTER — Inpatient Hospital Stay (HOSPITAL_COMMUNITY)
Admission: RE | Admit: 2017-01-01 | Discharge: 2017-01-10 | DRG: 219 | Disposition: A | Discharge status: Home Health | Payer: Medicare Other | Source: Ambulatory Visit | Attending: Thoracic Surgery (Cardiothoracic Vascular Surgery) | Admitting: Thoracic Surgery (Cardiothoracic Vascular Surgery)

## 2017-01-01 ENCOUNTER — Inpatient Hospital Stay (HOSPITAL_COMMUNITY): Payer: Medicare Other

## 2017-01-01 ENCOUNTER — Encounter (HOSPITAL_COMMUNITY): Payer: Self-pay | Admitting: Thoracic Surgery (Cardiothoracic Vascular Surgery)

## 2017-01-01 ENCOUNTER — Inpatient Hospital Stay (HOSPITAL_COMMUNITY): Payer: Medicare Other | Admitting: Anesthesiology

## 2017-01-01 ENCOUNTER — Encounter: Payer: BC Managed Care – PPO | Admitting: Thoracic Surgery (Cardiothoracic Vascular Surgery)

## 2017-01-01 ENCOUNTER — Inpatient Hospital Stay (HOSPITAL_COMMUNITY): Payer: Medicare Other | Admitting: Vascular Surgery

## 2017-01-01 DIAGNOSIS — Q231 Congenital insufficiency of aortic valve: Secondary | ICD-10-CM | POA: Diagnosis not present

## 2017-01-01 DIAGNOSIS — D62 Acute posthemorrhagic anemia: Secondary | ICD-10-CM | POA: Diagnosis not present

## 2017-01-01 DIAGNOSIS — I82503 Chronic embolism and thrombosis of unspecified deep veins of lower extremity, bilateral: Secondary | ICD-10-CM

## 2017-01-01 DIAGNOSIS — E039 Hypothyroidism, unspecified: Secondary | ICD-10-CM | POA: Diagnosis present

## 2017-01-01 DIAGNOSIS — I700 Atherosclerosis of aorta: Secondary | ICD-10-CM | POA: Diagnosis present

## 2017-01-01 DIAGNOSIS — J9811 Atelectasis: Secondary | ICD-10-CM | POA: Diagnosis not present

## 2017-01-01 DIAGNOSIS — E876 Hypokalemia: Secondary | ICD-10-CM | POA: Diagnosis not present

## 2017-01-01 DIAGNOSIS — I5033 Acute on chronic diastolic (congestive) heart failure: Secondary | ICD-10-CM | POA: Diagnosis present

## 2017-01-01 DIAGNOSIS — K219 Gastro-esophageal reflux disease without esophagitis: Secondary | ICD-10-CM | POA: Diagnosis present

## 2017-01-01 DIAGNOSIS — I110 Hypertensive heart disease with heart failure: Secondary | ICD-10-CM | POA: Diagnosis present

## 2017-01-01 DIAGNOSIS — D509 Iron deficiency anemia, unspecified: Secondary | ICD-10-CM | POA: Diagnosis present

## 2017-01-01 DIAGNOSIS — I82409 Acute embolism and thrombosis of unspecified deep veins of unspecified lower extremity: Secondary | ICD-10-CM | POA: Diagnosis present

## 2017-01-01 DIAGNOSIS — Z7901 Long term (current) use of anticoagulants: Secondary | ICD-10-CM

## 2017-01-01 DIAGNOSIS — I4510 Unspecified right bundle-branch block: Secondary | ICD-10-CM | POA: Diagnosis present

## 2017-01-01 DIAGNOSIS — Z87891 Personal history of nicotine dependence: Secondary | ICD-10-CM | POA: Diagnosis not present

## 2017-01-01 DIAGNOSIS — K5900 Constipation, unspecified: Secondary | ICD-10-CM | POA: Diagnosis not present

## 2017-01-01 DIAGNOSIS — Z6841 Body Mass Index (BMI) 40.0 and over, adult: Secondary | ICD-10-CM

## 2017-01-01 DIAGNOSIS — G4700 Insomnia, unspecified: Secondary | ICD-10-CM | POA: Diagnosis present

## 2017-01-01 DIAGNOSIS — Z952 Presence of prosthetic heart valve: Secondary | ICD-10-CM

## 2017-01-01 DIAGNOSIS — Z79899 Other long term (current) drug therapy: Secondary | ICD-10-CM | POA: Diagnosis not present

## 2017-01-01 DIAGNOSIS — Z954 Presence of other heart-valve replacement: Secondary | ICD-10-CM | POA: Diagnosis not present

## 2017-01-01 DIAGNOSIS — Z86718 Personal history of other venous thrombosis and embolism: Secondary | ICD-10-CM

## 2017-01-01 DIAGNOSIS — I493 Ventricular premature depolarization: Secondary | ICD-10-CM | POA: Diagnosis not present

## 2017-01-01 DIAGNOSIS — G4733 Obstructive sleep apnea (adult) (pediatric): Secondary | ICD-10-CM | POA: Diagnosis present

## 2017-01-01 DIAGNOSIS — I350 Nonrheumatic aortic (valve) stenosis: Secondary | ICD-10-CM | POA: Diagnosis present

## 2017-01-01 DIAGNOSIS — Q2381 Bicuspid aortic valve: Secondary | ICD-10-CM

## 2017-01-01 DIAGNOSIS — M1990 Unspecified osteoarthritis, unspecified site: Secondary | ICD-10-CM | POA: Diagnosis present

## 2017-01-01 DIAGNOSIS — I10 Essential (primary) hypertension: Secondary | ICD-10-CM | POA: Diagnosis present

## 2017-01-01 DIAGNOSIS — R0600 Dyspnea, unspecified: Secondary | ICD-10-CM | POA: Diagnosis present

## 2017-01-01 HISTORY — DX: Presence of other heart-valve replacement: Z95.4

## 2017-01-01 HISTORY — PX: TEE WITHOUT CARDIOVERSION: SHX5443

## 2017-01-01 HISTORY — PX: AORTIC VALVE REPLACEMENT: SHX41

## 2017-01-01 HISTORY — PX: ASCENDING AORTIC ROOT REPLACEMENT: SHX5729

## 2017-01-01 LAB — POCT I-STAT, CHEM 8
BUN: 17 mg/dL (ref 6–20)
BUN: 15 mg/dL (ref 6–20)
BUN: 16 mg/dL (ref 6–20)
BUN: 15 mg/dL (ref 6–20)
BUN: 16 mg/dL (ref 6–20)
BUN: 16 mg/dL (ref 6–20)
BUN: 15 mg/dL (ref 6–20)
BUN: 14 mg/dL (ref 6–20)
CALCIUM ION: 1.24 mmol/L (ref 1.15–1.40)
CALCIUM ION: 1.15 mmol/L (ref 1.15–1.40)
CALCIUM ION: 1.2 mmol/L (ref 1.15–1.40)
CHLORIDE: 102 mmol/L (ref 101–111)
CHLORIDE: 104 mmol/L (ref 101–111)
CHLORIDE: 104 mmol/L (ref 101–111)
CHLORIDE: 108 mmol/L (ref 101–111)
CHLORIDE: 109 mmol/L (ref 101–111)
CREATININE: 0.5 mg/dL (ref 0.44–1.00)
CREATININE: 0.5 mg/dL (ref 0.44–1.00)
CREATININE: 0.5 mg/dL (ref 0.44–1.00)
Calcium, Ion: 1.03 mmol/L — ABNORMAL LOW (ref 1.15–1.40)
Calcium, Ion: 1.19 mmol/L (ref 1.15–1.40)
Calcium, Ion: 1.11 mmol/L — ABNORMAL LOW (ref 1.15–1.40)
Calcium, Ion: 1.16 mmol/L (ref 1.15–1.40)
Calcium, Ion: 1.05 mmol/L — ABNORMAL LOW (ref 1.15–1.40)
Chloride: 100 mmol/L — ABNORMAL LOW (ref 101–111)
Chloride: 104 mmol/L (ref 101–111)
Chloride: 103 mmol/L (ref 101–111)
Creatinine, Ser: 0.6 mg/dL (ref 0.44–1.00)
Creatinine, Ser: 0.5 mg/dL (ref 0.44–1.00)
Creatinine, Ser: 0.5 mg/dL (ref 0.44–1.00)
Creatinine, Ser: 0.5 mg/dL (ref 0.44–1.00)
Creatinine, Ser: 0.6 mg/dL (ref 0.44–1.00)
GLUCOSE: 193 mg/dL — AB (ref 65–99)
Glucose, Bld: 120 mg/dL — ABNORMAL HIGH (ref 65–99)
Glucose, Bld: 127 mg/dL — ABNORMAL HIGH (ref 65–99)
Glucose, Bld: 135 mg/dL — ABNORMAL HIGH (ref 65–99)
Glucose, Bld: 174 mg/dL — ABNORMAL HIGH (ref 65–99)
Glucose, Bld: 188 mg/dL — ABNORMAL HIGH (ref 65–99)
Glucose, Bld: 182 mg/dL — ABNORMAL HIGH (ref 65–99)
Glucose, Bld: 157 mg/dL — ABNORMAL HIGH (ref 65–99)
HCT: 40 % (ref 36.0–46.0)
HCT: 33 % — ABNORMAL LOW (ref 36.0–46.0)
HCT: 33 % — ABNORMAL LOW (ref 36.0–46.0)
HCT: 37 % (ref 36.0–46.0)
HEMATOCRIT: 32 % — AB (ref 36.0–46.0)
HEMATOCRIT: 38 % (ref 36.0–46.0)
HEMATOCRIT: 33 % — AB (ref 36.0–46.0)
HEMATOCRIT: 32 % — AB (ref 36.0–46.0)
HEMOGLOBIN: 13.6 g/dL (ref 12.0–15.0)
HEMOGLOBIN: 10.9 g/dL — AB (ref 12.0–15.0)
HEMOGLOBIN: 12.9 g/dL (ref 12.0–15.0)
HEMOGLOBIN: 11.2 g/dL — AB (ref 12.0–15.0)
HEMOGLOBIN: 11.2 g/dL — AB (ref 12.0–15.0)
HEMOGLOBIN: 12.6 g/dL (ref 12.0–15.0)
Hemoglobin: 11.2 g/dL — ABNORMAL LOW (ref 12.0–15.0)
Hemoglobin: 10.9 g/dL — ABNORMAL LOW (ref 12.0–15.0)
POTASSIUM: 3.2 mmol/L — AB (ref 3.5–5.1)
POTASSIUM: 4 mmol/L (ref 3.5–5.1)
POTASSIUM: 4 mmol/L (ref 3.5–5.1)
Potassium: 3.4 mmol/L — ABNORMAL LOW (ref 3.5–5.1)
Potassium: 3.5 mmol/L (ref 3.5–5.1)
Potassium: 3.8 mmol/L (ref 3.5–5.1)
Potassium: 3.9 mmol/L (ref 3.5–5.1)
Potassium: 3.6 mmol/L (ref 3.5–5.1)
SODIUM: 143 mmol/L (ref 135–145)
SODIUM: 141 mmol/L (ref 135–145)
SODIUM: 142 mmol/L (ref 135–145)
SODIUM: 141 mmol/L (ref 135–145)
SODIUM: 142 mmol/L (ref 135–145)
SODIUM: 142 mmol/L (ref 135–145)
SODIUM: 145 mmol/L (ref 135–145)
Sodium: 141 mmol/L (ref 135–145)
TCO2: 29 mmol/L (ref 0–100)
TCO2: 28 mmol/L (ref 0–100)
TCO2: 29 mmol/L (ref 0–100)
TCO2: 29 mmol/L (ref 0–100)
TCO2: 29 mmol/L (ref 0–100)
TCO2: 28 mmol/L (ref 0–100)
TCO2: 23 mmol/L (ref 0–100)
TCO2: 23 mmol/L (ref 0–100)

## 2017-01-01 LAB — CBC
HCT: 39.1 % (ref 36.0–46.0)
HCT: 38.2 % (ref 36.0–46.0)
HEMOGLOBIN: 12.3 g/dL (ref 12.0–15.0)
Hemoglobin: 12.7 g/dL (ref 12.0–15.0)
MCH: 31.3 pg (ref 26.0–34.0)
MCH: 31.1 pg (ref 26.0–34.0)
MCHC: 32.5 g/dL (ref 30.0–36.0)
MCHC: 32.2 g/dL (ref 30.0–36.0)
MCV: 96.3 fL (ref 78.0–100.0)
MCV: 96.7 fL (ref 78.0–100.0)
Platelets: 156 10*3/uL (ref 150–400)
Platelets: 146 10*3/uL — ABNORMAL LOW (ref 150–400)
RBC: 4.06 MIL/uL (ref 3.87–5.11)
RBC: 3.95 MIL/uL (ref 3.87–5.11)
RDW: 13.6 % (ref 11.5–15.5)
RDW: 13.5 % (ref 11.5–15.5)
WBC: 19.2 10*3/uL — ABNORMAL HIGH (ref 4.0–10.5)
WBC: 14.3 10*3/uL — ABNORMAL HIGH (ref 4.0–10.5)

## 2017-01-01 LAB — POCT I-STAT 3, ART BLOOD GAS (G3+)
ACID-BASE DEFICIT: 4 mmol/L — AB (ref 0.0–2.0)
Acid-Base Excess: 5 mmol/L — ABNORMAL HIGH (ref 0.0–2.0)
Acid-base deficit: 4 mmol/L — ABNORMAL HIGH (ref 0.0–2.0)
Acid-base deficit: 6 mmol/L — ABNORMAL HIGH (ref 0.0–2.0)
BICARBONATE: 30.1 mmol/L — AB (ref 20.0–28.0)
BICARBONATE: 21.5 mmol/L (ref 20.0–28.0)
BICARBONATE: 23.8 mmol/L (ref 20.0–28.0)
Bicarbonate: 20.8 mmol/L (ref 20.0–28.0)
O2 SAT: 100 %
O2 SAT: 89 %
O2 Saturation: 100 %
O2 Saturation: 96 %
PCO2 ART: 44.6 mmHg (ref 32.0–48.0)
PCO2 ART: 40.6 mmHg (ref 32.0–48.0)
PCO2 ART: 46.1 mmHg (ref 32.0–48.0)
PH ART: 7.438 (ref 7.350–7.450)
PH ART: 7.258 — AB (ref 7.350–7.450)
PH ART: 7.26 — AB (ref 7.350–7.450)
PO2 ART: 299 mmHg — AB (ref 83.0–108.0)
PO2 ART: 279 mmHg — AB (ref 83.0–108.0)
Patient temperature: 35.5
Patient temperature: 36.7
TCO2: 31 mmol/L (ref 0–100)
TCO2: 23 mmol/L (ref 0–100)
TCO2: 25 mmol/L (ref 0–100)
TCO2: 22 mmol/L (ref 0–100)
pCO2 arterial: 52.3 mmHg — ABNORMAL HIGH (ref 32.0–48.0)
pH, Arterial: 7.332 — ABNORMAL LOW (ref 7.350–7.450)
pO2, Arterial: 88 mmHg (ref 83.0–108.0)
pO2, Arterial: 65 mmHg — ABNORMAL LOW (ref 83.0–108.0)

## 2017-01-01 LAB — ECHO TEE
AV Area VTI index: 0.56 cm2/m2
AV Area mean vel: 1.4 cm2
AV Mean grad: 70 mmHg
AV Peak grad: 120 mmHg
AV VEL mean LVOT/AV: 0.45
AV area mean vel ind: 0.62 cm2/m2
AV pk vel: 548 cm/s
AV vel: 1.27
AVA: 1.27 cm2
AVAREAVTI: 1.62 cm2
AVLVOTPG: 32 mmHg
Ao pk vel: 0.51 m/s
CHL CUP AV PEAK INDEX: 0.72
CHL CUP DOP CALC LVOT VTI: 59.5 cm
DOP CAL AO MEAN VELOCITY: 392 cm/s
LVOT SV: 187 mL
LVOT area: 3.14 cm2
LVOTD: 20 mm
LVOTPV: 282 cm/s
LVOTVTI: 0.4 cm
VTI: 147 cm
Valve area index: 0.56

## 2017-01-01 LAB — HEMOGLOBIN AND HEMATOCRIT, BLOOD
HEMATOCRIT: 33.8 % — AB (ref 36.0–46.0)
Hemoglobin: 11.1 g/dL — ABNORMAL LOW (ref 12.0–15.0)

## 2017-01-01 LAB — COOXEMETRY PANEL
Carboxyhemoglobin: 1.4 % (ref 0.5–1.5)
Methemoglobin: 1.4 % (ref 0.0–1.5)
O2 Saturation: 73.3 %
Total hemoglobin: 12.7 g/dL (ref 12.0–16.0)

## 2017-01-01 LAB — PLATELET COUNT: PLATELETS: 123 10*3/uL — AB (ref 150–400)

## 2017-01-01 LAB — POCT I-STAT 4, (NA,K, GLUC, HGB,HCT)
Glucose, Bld: 120 mg/dL — ABNORMAL HIGH (ref 65–99)
HCT: 40 % (ref 36.0–46.0)
Hemoglobin: 13.6 g/dL (ref 12.0–15.0)
Potassium: 3.6 mmol/L (ref 3.5–5.1)
Sodium: 152 mmol/L — ABNORMAL HIGH (ref 135–145)

## 2017-01-01 LAB — GLUCOSE, CAPILLARY
GLUCOSE-CAPILLARY: 160 mg/dL — AB (ref 65–99)
GLUCOSE-CAPILLARY: 141 mg/dL — AB (ref 65–99)
GLUCOSE-CAPILLARY: 131 mg/dL — AB (ref 65–99)
Glucose-Capillary: 156 mg/dL — ABNORMAL HIGH (ref 65–99)
Glucose-Capillary: 173 mg/dL — ABNORMAL HIGH (ref 65–99)

## 2017-01-01 LAB — APTT: aPTT: 42 seconds — ABNORMAL HIGH (ref 24–36)

## 2017-01-01 LAB — CREATININE, SERUM
Creatinine, Ser: 0.68 mg/dL (ref 0.44–1.00)
GFR calc Af Amer: >60 mL/min (ref >60)
GFR calc non Af Amer: >60 mL/min (ref >60)

## 2017-01-01 LAB — MAGNESIUM: MAGNESIUM: 2.9 mg/dL — AB (ref 1.7–2.4)

## 2017-01-01 LAB — PROTIME-INR
INR: 1.53
PROTHROMBIN TIME: 18.6 s — AB (ref 11.4–15.2)

## 2017-01-01 LAB — TYPE AND SCREEN
ABO/RH(D): O POS
Antibody Screen: NEGATIVE

## 2017-01-01 SURGERY — REPLACEMENT, AORTIC VALVE, MINIMALLY INVASIVE
Anesthesia: General | Site: Chest

## 2017-01-01 MED ORDER — ASPIRIN EC 325 MG PO TBEC: 325.0000 mg | DELAYED_RELEASE_TABLET | Freq: Every day | ORAL | Status: DC

## 2017-01-01 MED ORDER — LACTATED RINGERS IV SOLN: INTRAVENOUS | Status: DC | PRN

## 2017-01-01 MED ORDER — SODIUM CHLORIDE 0.9 % IV SOLN
INTRAVENOUS | Status: DC
  Administered 2017-01-01: 8.9 [IU]/h via INTRAVENOUS
  Filled 2017-01-01 (×2): qty 1

## 2017-01-01 MED ORDER — MIDAZOLAM HCL 10 MG/2ML IJ SOLN
INTRAMUSCULAR | Status: AC
Start: 2017-01-01 — End: 2017-01-01
  Filled 2017-01-01: qty 2

## 2017-01-01 MED ORDER — MORPHINE SULFATE (PF) 2 MG/ML IV SOLN: 1.0000 mg | INTRAVENOUS | Status: DC | PRN

## 2017-01-01 MED ORDER — LEVOTHYROXINE SODIUM 75 MCG PO TABS
150.0000 ug | ORAL_TABLET | Freq: Every day | ORAL | Status: DC
  Administered 2017-01-02 – 2017-01-10 (×9): 150 ug via ORAL
  Filled 2017-01-01: qty 2
  Filled 2017-01-01: qty 1
  Filled 2017-01-01 (×3): qty 2
  Filled 2017-01-01: qty 1
  Filled 2017-01-01 (×2): qty 2
  Filled 2017-01-01: qty 1
  Filled 2017-01-01: qty 2
  Filled 2017-01-01: qty 1
  Filled 2017-01-01: qty 2

## 2017-01-01 MED ORDER — FAMOTIDINE IN NACL 20-0.9 MG/50ML-% IV SOLN
20.0000 mg | Freq: Two times a day (BID) | INTRAVENOUS | Status: AC
  Administered 2017-01-01 (×2): 20 mg via INTRAVENOUS
  Filled 2017-01-01: qty 50

## 2017-01-01 MED ORDER — FENTANYL CITRATE (PF) 250 MCG/5ML IJ SOLN
INTRAMUSCULAR | Status: AC
  Filled 2017-01-01: qty 25

## 2017-01-01 MED ORDER — CHLORHEXIDINE GLUCONATE 0.12 % MT SOLN
15.0000 mL | OROMUCOSAL | Status: AC
  Administered 2017-01-01: 15 mL via OROMUCOSAL

## 2017-01-01 MED ORDER — HEMOSTATIC AGENTS (NO CHARGE) OPTIME
TOPICAL | Status: DC | PRN
  Administered 2017-01-01: 1 via TOPICAL

## 2017-01-01 MED ORDER — PANTOPRAZOLE SODIUM 40 MG PO TBEC
40.0000 mg | DELAYED_RELEASE_TABLET | Freq: Every day | ORAL | Status: DC
  Administered 2017-01-02 – 2017-01-06 (×5): 40 mg via ORAL
  Filled 2017-01-01 (×5): qty 1

## 2017-01-01 MED ORDER — MILRINONE LACTATE IN DEXTROSE 20-5 MG/100ML-% IV SOLN
0.1250 ug/kg/min | INTRAVENOUS | Status: AC
  Administered 2017-01-01: .3 ug/kg/min via INTRAVENOUS
  Filled 2017-01-01: qty 100

## 2017-01-01 MED ORDER — SODIUM CHLORIDE 0.9 % IV SOLN
INTRAVENOUS | Status: DC
  Administered 2017-01-01: 16:00:00 via INTRAVENOUS

## 2017-01-01 MED ORDER — HEPARIN SODIUM (PORCINE) 1000 UNIT/ML IJ SOLN
INTRAMUSCULAR | Status: AC
  Filled 2017-01-01: qty 1

## 2017-01-01 MED ORDER — OXYCODONE HCL 5 MG PO TABS
5.0000 mg | ORAL_TABLET | ORAL | Status: DC | PRN
  Administered 2017-01-02 – 2017-01-05 (×12): 10 mg via ORAL
  Filled 2017-01-01 (×12): qty 2

## 2017-01-01 MED ORDER — EPHEDRINE 5 MG/ML INJ
INTRAVENOUS | Status: AC
  Filled 2017-01-01: qty 10

## 2017-01-01 MED ORDER — SODIUM CHLORIDE 0.9 % IV SOLN
0.0000 ug/kg/h | INTRAVENOUS | Status: DC
  Filled 2017-01-01 (×2): qty 2

## 2017-01-01 MED ORDER — LACTATED RINGERS IV SOLN
INTRAVENOUS | Status: DC | PRN
  Administered 2017-01-01: 07:00:00 via INTRAVENOUS

## 2017-01-01 MED ORDER — FENTANYL CITRATE (PF) 250 MCG/5ML IJ SOLN
INTRAMUSCULAR | Status: AC
  Filled 2017-01-01: qty 5

## 2017-01-01 MED ORDER — ACETAMINOPHEN 650 MG RE SUPP
650.0000 mg | Freq: Once | RECTAL | Status: AC
  Administered 2017-01-01: 650 mg via RECTAL

## 2017-01-01 MED ORDER — SODIUM CHLORIDE 0.9 % IV SOLN
INTRAVENOUS | Status: DC | PRN
  Administered 2017-01-01 (×2): via INTRAVENOUS

## 2017-01-01 MED ORDER — SODIUM CHLORIDE 0.9% FLUSH: 3.0000 mL | INTRAVENOUS | Status: DC | PRN

## 2017-01-01 MED ORDER — SODIUM BICARBONATE 8.4 % IV SOLN
50.0000 meq | Freq: Once | INTRAVENOUS | Status: AC
  Administered 2017-01-01: 50 meq via INTRAVENOUS

## 2017-01-01 MED ORDER — MAGNESIUM SULFATE 4 GM/100ML IV SOLN
4.0000 g | Freq: Once | INTRAVENOUS | Status: AC
  Administered 2017-01-01: 4 g via INTRAVENOUS
  Filled 2017-01-01: qty 100

## 2017-01-01 MED ORDER — MILRINONE LACTATE IN DEXTROSE 20-5 MG/100ML-% IV SOLN
0.3000 ug/kg/min | INTRAVENOUS | Status: DC
  Administered 2017-01-01 – 2017-01-02 (×2): 0.3 ug/kg/min via INTRAVENOUS
  Filled 2017-01-01 (×2): qty 100

## 2017-01-01 MED ORDER — MIDAZOLAM HCL 5 MG/5ML IJ SOLN
INTRAMUSCULAR | Status: DC | PRN
  Administered 2017-01-01: 2 mg via INTRAVENOUS
  Administered 2017-01-01: 3 mg via INTRAVENOUS
  Administered 2017-01-01 (×2): 2 mg via INTRAVENOUS
  Administered 2017-01-01: 3 mg via INTRAVENOUS

## 2017-01-01 MED ORDER — HEPARIN SODIUM (PORCINE) 1000 UNIT/ML IJ SOLN
INTRAMUSCULAR | Status: DC | PRN
  Administered 2017-01-01: 30000 [IU] via INTRAVENOUS

## 2017-01-01 MED ORDER — TRANEXAMIC ACID 1000 MG/10ML IV SOLN
1000.0000 mg | INTRAVENOUS | Status: DC
  Filled 2017-01-01: qty 10

## 2017-01-01 MED ORDER — DOPAMINE-DEXTROSE 3.2-5 MG/ML-% IV SOLN
0.0000 ug/kg/min | INTRAVENOUS | Status: DC
  Administered 2017-01-01: 3 ug/kg/min via INTRAVENOUS

## 2017-01-01 MED ORDER — MORPHINE SULFATE (PF) 4 MG/ML IV SOLN
1.0000 mg | INTRAVENOUS | Status: DC | PRN
  Administered 2017-01-02 – 2017-01-03 (×6): 2 mg via INTRAVENOUS
  Filled 2017-01-01 (×6): qty 1

## 2017-01-01 MED ORDER — PROPOFOL 10 MG/ML IV BOLUS
INTRAVENOUS | Status: AC
  Filled 2017-01-01: qty 20

## 2017-01-01 MED ORDER — POTASSIUM CHLORIDE 10 MEQ/50ML IV SOLN
10.0000 meq | INTRAVENOUS | Status: AC
  Administered 2017-01-01 (×3): 10 meq via INTRAVENOUS

## 2017-01-01 MED ORDER — CHLORHEXIDINE GLUCONATE 4 % EX LIQD: 30.0000 mL | CUTANEOUS | Status: DC

## 2017-01-01 MED ORDER — ROCURONIUM BROMIDE 10 MG/ML (PF) SYRINGE
PREFILLED_SYRINGE | INTRAVENOUS | Status: AC
  Filled 2017-01-01: qty 5

## 2017-01-01 MED ORDER — CHLORHEXIDINE GLUCONATE 0.12% ORAL RINSE (MEDLINE KIT)
15.0000 mL | Freq: Two times a day (BID) | OROMUCOSAL | Status: DC
  Administered 2017-01-01: 15 mL via OROMUCOSAL

## 2017-01-01 MED ORDER — ONDANSETRON HCL 4 MG/2ML IJ SOLN
4.0000 mg | Freq: Four times a day (QID) | INTRAMUSCULAR | Status: DC | PRN
  Administered 2017-01-02: 4 mg via INTRAVENOUS
  Filled 2017-01-01: qty 2

## 2017-01-01 MED ORDER — SODIUM CHLORIDE 0.9 % IV SOLN: INTRAVENOUS | Status: DC

## 2017-01-01 MED ORDER — ACETAMINOPHEN 160 MG/5ML PO SOLN: 650.0000 mg | Freq: Once | ORAL | Status: AC

## 2017-01-01 MED ORDER — BISACODYL 5 MG PO TBEC
10.0000 mg | DELAYED_RELEASE_TABLET | Freq: Every day | ORAL | Status: DC
  Administered 2017-01-02 – 2017-01-05 (×4): 10 mg via ORAL
  Filled 2017-01-01 (×4): qty 2

## 2017-01-01 MED ORDER — LACTATED RINGERS IV SOLN: INTRAVENOUS | Status: DC

## 2017-01-01 MED ORDER — SODIUM CHLORIDE 0.9 % IJ SOLN
INTRAMUSCULAR | Status: DC | PRN
  Administered 2017-01-01 (×5): 4 mL via TOPICAL

## 2017-01-01 MED ORDER — 0.9 % SODIUM CHLORIDE (POUR BTL) OPTIME
TOPICAL | Status: DC | PRN
  Administered 2017-01-01: 5000 mL

## 2017-01-01 MED ORDER — SODIUM CHLORIDE 0.45 % IV SOLN
INTRAVENOUS | Status: DC | PRN
  Administered 2017-01-01: 16:00:00 via INTRAVENOUS

## 2017-01-01 MED ORDER — LACTATED RINGERS IV SOLN: 500.0000 mL | Freq: Once | INTRAVENOUS | Status: DC | PRN

## 2017-01-01 MED ORDER — TRAMADOL HCL 50 MG PO TABS: 50.0000 mg | ORAL_TABLET | ORAL | Status: DC | PRN

## 2017-01-01 MED ORDER — VECURONIUM BROMIDE 10 MG IV SOLR
INTRAVENOUS | Status: DC | PRN
  Administered 2017-01-01 (×4): 5 mg via INTRAVENOUS

## 2017-01-01 MED ORDER — DOCUSATE SODIUM 100 MG PO CAPS
200.0000 mg | ORAL_CAPSULE | Freq: Every day | ORAL | Status: DC
Start: 2017-01-02 — End: 2017-01-05
  Administered 2017-01-02 – 2017-01-05 (×4): 200 mg via ORAL
  Filled 2017-01-01 (×4): qty 2

## 2017-01-01 MED ORDER — FENTANYL CITRATE (PF) 250 MCG/5ML IJ SOLN
INTRAMUSCULAR | Status: DC | PRN
  Administered 2017-01-01: 200 ug via INTRAVENOUS
  Administered 2017-01-01 (×3): 100 ug via INTRAVENOUS
  Administered 2017-01-01: 150 ug via INTRAVENOUS
  Administered 2017-01-01: 50 ug via INTRAVENOUS
  Administered 2017-01-01 (×2): 100 ug via INTRAVENOUS
  Administered 2017-01-01: 175 ug via INTRAVENOUS
  Administered 2017-01-01: 150 ug via INTRAVENOUS
  Administered 2017-01-01 (×2): 100 ug via INTRAVENOUS
  Administered 2017-01-01: 50 ug via INTRAVENOUS
  Administered 2017-01-01: 25 ug via INTRAVENOUS

## 2017-01-01 MED ORDER — ORAL CARE MOUTH RINSE
15.0000 mL | Freq: Two times a day (BID) | OROMUCOSAL | Status: DC
  Administered 2017-01-02 – 2017-01-09 (×9): 15 mL via OROMUCOSAL

## 2017-01-01 MED ORDER — DEXTROSE 5 % IV SOLN
1.5000 g | Freq: Two times a day (BID) | INTRAVENOUS | Status: AC
  Administered 2017-01-01 – 2017-01-03 (×4): 1.5 g via INTRAVENOUS
  Filled 2017-01-01 (×4): qty 1.5

## 2017-01-01 MED ORDER — SODIUM CHLORIDE 0.9 % IR SOLN
Status: DC | PRN
  Administered 2017-01-01: 3000 mL

## 2017-01-01 MED ORDER — PROPOFOL 10 MG/ML IV BOLUS
INTRAVENOUS | Status: DC | PRN
  Administered 2017-01-01: 150 mg via INTRAVENOUS

## 2017-01-01 MED ORDER — VANCOMYCIN HCL IN DEXTROSE 1-5 GM/200ML-% IV SOLN
1000.0000 mg | Freq: Once | INTRAVENOUS | Status: AC
  Administered 2017-01-01: 1000 mg via INTRAVENOUS
  Filled 2017-01-01: qty 200

## 2017-01-01 MED ORDER — POTASSIUM CHLORIDE 10 MEQ/50ML IV SOLN
10.0000 meq | INTRAVENOUS | Status: AC
  Administered 2017-01-01 – 2017-01-02 (×3): 10 meq via INTRAVENOUS
  Filled 2017-01-01 (×2): qty 50

## 2017-01-01 MED ORDER — ALBUMIN HUMAN 5 % IV SOLN
INTRAVENOUS | Status: DC | PRN
  Administered 2017-01-01 (×2): via INTRAVENOUS

## 2017-01-01 MED ORDER — ORAL CARE MOUTH RINSE
15.0000 mL | Freq: Four times a day (QID) | OROMUCOSAL | Status: DC
  Administered 2017-01-01: 15 mL via OROMUCOSAL

## 2017-01-01 MED ORDER — SODIUM CHLORIDE 0.9% FLUSH
3.0000 mL | Freq: Two times a day (BID) | INTRAVENOUS | Status: DC
  Administered 2017-01-02 – 2017-01-05 (×6): 3 mL via INTRAVENOUS

## 2017-01-01 MED ORDER — PROTAMINE SULFATE 10 MG/ML IV SOLN
INTRAVENOUS | Status: DC | PRN
  Administered 2017-01-01: 280 mg via INTRAVENOUS

## 2017-01-01 MED ORDER — MIDAZOLAM HCL 2 MG/2ML IJ SOLN
INTRAMUSCULAR | Status: AC
  Filled 2017-01-01: qty 2

## 2017-01-01 MED ORDER — SODIUM CHLORIDE 0.9 % IV SOLN
250.0000 mL | INTRAVENOUS | Status: DC
  Administered 2017-01-02: 250 mL via INTRAVENOUS

## 2017-01-01 MED ORDER — ROCURONIUM BROMIDE 10 MG/ML (PF) SYRINGE
PREFILLED_SYRINGE | INTRAVENOUS | Status: DC | PRN
  Administered 2017-01-01: 40 mg via INTRAVENOUS
  Administered 2017-01-01: 50 mg via INTRAVENOUS

## 2017-01-01 MED ORDER — VECURONIUM BROMIDE 10 MG IV SOLR
INTRAVENOUS | Status: AC
  Filled 2017-01-01: qty 20

## 2017-01-01 MED ORDER — INSULIN REGULAR BOLUS VIA INFUSION
0.0000 [IU] | Freq: Three times a day (TID) | INTRAVENOUS | Status: DC
  Filled 2017-01-01: qty 10

## 2017-01-01 MED ORDER — ACETAMINOPHEN 500 MG PO TABS
1000.0000 mg | ORAL_TABLET | Freq: Four times a day (QID) | ORAL | Status: DC
  Administered 2017-01-02 – 2017-01-05 (×9): 1000 mg via ORAL
  Filled 2017-01-01 (×9): qty 2

## 2017-01-01 MED ORDER — ASPIRIN 81 MG PO CHEW
324.0000 mg | CHEWABLE_TABLET | Freq: Every day | ORAL | Status: DC
  Administered 2017-01-02 – 2017-01-04 (×3): 324 mg
  Filled 2017-01-01 (×4): qty 4

## 2017-01-01 MED ORDER — METOPROLOL TARTRATE 5 MG/5ML IV SOLN: 2.5000 mg | INTRAVENOUS | Status: DC | PRN

## 2017-01-01 MED ORDER — BISACODYL 10 MG RE SUPP: 10.0000 mg | Freq: Every day | RECTAL | Status: DC

## 2017-01-01 MED ORDER — NITROGLYCERIN IN D5W 200-5 MCG/ML-% IV SOLN: 0.0000 ug/min | INTRAVENOUS | Status: DC

## 2017-01-01 MED ORDER — MIDAZOLAM HCL 2 MG/2ML IJ SOLN: 2.0000 mg | INTRAMUSCULAR | Status: DC | PRN

## 2017-01-01 MED ORDER — SODIUM CHLORIDE 0.9 % IV SOLN
0.0000 ug/min | INTRAVENOUS | Status: DC
  Administered 2017-01-01: 60 ug/min via INTRAVENOUS
  Administered 2017-01-01: 90 ug/min via INTRAVENOUS
  Filled 2017-01-01 (×3): qty 2

## 2017-01-01 MED ORDER — ALBUMIN HUMAN 5 % IV SOLN
250.0000 mL | INTRAVENOUS | Status: AC | PRN
  Administered 2017-01-01 (×3): 250 mL via INTRAVENOUS
  Filled 2017-01-01 (×2): qty 250

## 2017-01-01 MED ORDER — ACETAMINOPHEN 160 MG/5ML PO SOLN: 1000.0000 mg | Freq: Four times a day (QID) | ORAL | Status: DC

## 2017-01-01 MED ORDER — PHENYLEPHRINE 40 MCG/ML (10ML) SYRINGE FOR IV PUSH (FOR BLOOD PRESSURE SUPPORT)
PREFILLED_SYRINGE | INTRAVENOUS | Status: AC
  Filled 2017-01-01: qty 10

## 2017-01-01 MED FILL — Potassium Chloride Inj 2 mEq/ML: INTRAVENOUS | Qty: 10 | Status: AC

## 2017-01-01 MED FILL — Heparin Sodium (Porcine) Inj 1000 Unit/ML: INTRAMUSCULAR | Qty: 30 | Status: AC

## 2017-01-01 MED FILL — Magnesium Sulfate Inj 50%: INTRAMUSCULAR | Qty: 10 | Status: AC

## 2017-01-01 SURGICAL SUPPLY — 139 items
ADAPTER CARDIO PERF ANTE/RETRO (ADAPTER) ×6 IMPLANT
ADH SKN CLS APL DERMABOND .7 (GAUZE/BANDAGES/DRESSINGS) ×2
ADH SRG 12 PREFL SYR 3 SPRDR (MISCELLANEOUS)
ADPR PRFSN 84XANTGRD RTRGD (ADAPTER) ×4
APL SRG 7X2 LUM MLBL SLNT (VASCULAR PRODUCTS) ×4
APPLICATOR TIP COSEAL (VASCULAR PRODUCTS) ×2 IMPLANT
APPLICATOR TIP STD SYR BGAT-SY (MISCELLANEOUS) IMPLANT
ATTRACTOMAT 16X20 MAGNETIC DRP (DRAPES) ×2 IMPLANT
BAG DECANTER FOR FLEXI CONT (MISCELLANEOUS) ×6 IMPLANT
BLADE STERNUM SYSTEM 6 (BLADE) ×3 IMPLANT
BLADE SURG 11 STRL SS (BLADE) ×3 IMPLANT
CANISTER SUCT 3000ML PPV (MISCELLANEOUS) ×6 IMPLANT
CANNULA AORTIC ROOT 9FR (CANNULA) ×1 IMPLANT
CANNULA EZ GLIDE AORTIC 21FR (CANNULA) ×1 IMPLANT
CANNULA FEM VENOUS REMOTE 22FR (CANNULA) ×1 IMPLANT
CANNULA GUNDRY RCSP 15FR (MISCELLANEOUS) ×5 IMPLANT
CANNULA OPTISITE PERFUSION 16F (CANNULA) IMPLANT
CANNULA OPTISITE PERFUSION 18F (CANNULA) IMPLANT
CANNULA SUMP PERICARDIAL (CANNULA) ×3 IMPLANT
CANNULA VENNOUS METAL TIP 20FR (CANNULA) ×1 IMPLANT
CATH CPB KIT OWEN (MISCELLANEOUS) ×3 IMPLANT
CATH ENDOVENT PULMONARY (CATHETERS) IMPLANT
CATH HEART VENT LEFT (CATHETERS) ×4 IMPLANT
CATH RETROPLEGIA CORONARY 14FR (CATHETERS) ×1 IMPLANT
CATH ROBINSON RED A/P 18FR (CATHETERS) ×6 IMPLANT
CATH THORACIC 36FR (CATHETERS) IMPLANT
CATH THORACIC 36FR RT ANG (CATHETERS) ×3 IMPLANT
CAUTERY SURG HI TEMP FINE TIP (MISCELLANEOUS) ×1 IMPLANT
CELLS DAT CNTRL 66122 CELL SVR (MISCELLANEOUS) IMPLANT
CONN ST 1/4X3/8  BEN (MISCELLANEOUS) ×3
CONN ST 1/4X3/8 BEN (MISCELLANEOUS) ×4 IMPLANT
CONNECTOR 1/2X3/8X1/2 3 WAY (MISCELLANEOUS) ×1
CONNECTOR 1/2X3/8X1/2 3WAY (MISCELLANEOUS) ×2 IMPLANT
CONT SPEC 4OZ CLIKSEAL STRL BL (MISCELLANEOUS) ×4 IMPLANT
COVER BACK TABLE 24X17X13 BIG (DRAPES) ×3 IMPLANT
COVER PROBE W GEL 5X96 (DRAPES) ×1 IMPLANT
COVER SURGICAL LIGHT HANDLE (MISCELLANEOUS) ×4 IMPLANT
CRADLE DONUT ADULT HEAD (MISCELLANEOUS) ×5 IMPLANT
DERMABOND ADVANCED (GAUZE/BANDAGES/DRESSINGS) ×1
DERMABOND ADVANCED .7 DNX12 (GAUZE/BANDAGES/DRESSINGS) ×4 IMPLANT
DEVICE PMI PUNCTURE CLOSURE (MISCELLANEOUS) ×2 IMPLANT
DEVICE TROCAR PUNCTURE CLOSURE (ENDOMECHANICALS) ×2 IMPLANT
DRAIN CHANNEL 32F RND 10.7 FF (WOUND CARE) ×6 IMPLANT
DRAPE BILATERAL SPLIT (DRAPES) ×3 IMPLANT
DRAPE CV SPLIT W-CLR ANES SCRN (DRAPES) ×3 IMPLANT
DRAPE INCISE IOBAN 66X45 STRL (DRAPES) ×12 IMPLANT
DRAPE SLUSH/WARMER DISC (DRAPES) ×4 IMPLANT
DRSG AQUACEL AG ADV 3.5X14 (GAUZE/BANDAGES/DRESSINGS) ×1 IMPLANT
DRSG COVADERM 4X14 (GAUZE/BANDAGES/DRESSINGS) ×2 IMPLANT
ELECT BLADE 4.0 EZ CLEAN MEGAD (MISCELLANEOUS) ×3
ELECT BLADE 6.5 EXT (BLADE) ×3 IMPLANT
ELECT REM PT RETURN 9FT ADLT (ELECTROSURGICAL) ×6
ELECTRODE BLDE 4.0 EZ CLN MEGD (MISCELLANEOUS) ×2 IMPLANT
ELECTRODE REM PT RTRN 9FT ADLT (ELECTROSURGICAL) ×6 IMPLANT
FELT TEFLON 1X6 (MISCELLANEOUS) ×6 IMPLANT
FEMORAL VENOUS CANN RAP (CANNULA) IMPLANT
GAUZE SPONGE 4X4 12PLY STRL (GAUZE/BANDAGES/DRESSINGS) ×7 IMPLANT
GLOVE BIO SURGEON STRL SZ 6 (GLOVE) IMPLANT
GLOVE BIO SURGEON STRL SZ 6.5 (GLOVE) ×3 IMPLANT
GLOVE BIO SURGEON STRL SZ7 (GLOVE) IMPLANT
GLOVE BIO SURGEON STRL SZ7.5 (GLOVE) IMPLANT
GLOVE ORTHO TXT STRL SZ7.5 (GLOVE) ×15 IMPLANT
GLOVE SURG SS PI 6.0 STRL IVOR (GLOVE) ×1 IMPLANT
GOWN STRL REUS W/ TWL LRG LVL3 (GOWN DISPOSABLE) ×16 IMPLANT
GOWN STRL REUS W/TWL LRG LVL3 (GOWN DISPOSABLE) ×24
GRASPER SUT TROCAR 14GX15 (MISCELLANEOUS) ×1 IMPLANT
HEMOSTAT POWDER SURGIFOAM 1G (HEMOSTASIS) ×10 IMPLANT
INSERT FOGARTY XLG (MISCELLANEOUS) ×3 IMPLANT
KIT BASIN OR (CUSTOM PROCEDURE TRAY) ×5 IMPLANT
KIT CATH SUCT 8FR (CATHETERS) ×2 IMPLANT
KIT DILATOR VASC 18G NDL (KITS) ×4 IMPLANT
KIT DRAINAGE VACCUM ASSIST (KITS) ×1 IMPLANT
KIT ROOM TURNOVER OR (KITS) ×5 IMPLANT
KIT SUCTION CATH 14FR (SUCTIONS) ×9 IMPLANT
LEAD PACING MYOCARDI (MISCELLANEOUS) ×5 IMPLANT
LINE VENT (MISCELLANEOUS) ×1 IMPLANT
NDL AORTIC ROOT 14G 7F (CATHETERS) ×2 IMPLANT
NDL SUT 1 .5 CRC FRENCH EYE (NEEDLE) IMPLANT
NEEDLE AORTIC ROOT 14G 7F (CATHETERS) ×3 IMPLANT
NEEDLE FRENCH EYE (NEEDLE) ×3
NS IRRIG 1000ML POUR BTL (IV SOLUTION) ×25 IMPLANT
PACK OPEN HEART (CUSTOM PROCEDURE TRAY) ×4 IMPLANT
PAD ARMBOARD 7.5X6 YLW CONV (MISCELLANEOUS) ×11 IMPLANT
PAD ELECT DEFIB RADIOL ZOLL (MISCELLANEOUS) ×3 IMPLANT
RETRACTOR WND ALEXIS 18 MED (MISCELLANEOUS) ×2 IMPLANT
RTRCTR WOUND ALEXIS 18CM MED (MISCELLANEOUS)
SEALANT SURG COSEAL 8ML (VASCULAR PRODUCTS) ×1 IMPLANT
SET CANNULATION TOURNIQUET (MISCELLANEOUS) ×3 IMPLANT
SET CARDIOPLEGIA MPS 5001102 (MISCELLANEOUS) ×1 IMPLANT
SET IRRIG TUBING LAPAROSCOPIC (IRRIGATION / IRRIGATOR) ×5 IMPLANT
SOLUTION ANTI FOG 6CC (MISCELLANEOUS) ×3 IMPLANT
SPONGE LAP 18X18 X RAY DECT (DISPOSABLE) ×2 IMPLANT
SPONGE LAP 4X18 X RAY DECT (DISPOSABLE) ×2 IMPLANT
SUT BONE WAX W31G (SUTURE) ×5 IMPLANT
SUT ETHIBON 2 0 V 52N 30 (SUTURE) ×1 IMPLANT
SUT ETHIBON EXCEL 2-0 V-5 (SUTURE) IMPLANT
SUT ETHIBOND 2 0 SH (SUTURE) ×1 IMPLANT
SUT ETHIBOND 2 0 SH 36X2 (SUTURE) IMPLANT
SUT ETHIBOND 2 0 V4 (SUTURE) IMPLANT
SUT ETHIBOND 2 0V4 GREEN (SUTURE) IMPLANT
SUT ETHIBOND 4 0 RB 1 (SUTURE) ×4 IMPLANT
SUT ETHIBOND V-5 VALVE (SUTURE) IMPLANT
SUT ETHIBOND X763 2 0 SH 1 (SUTURE) ×14 IMPLANT
SUT GORETEX CV 4 TH 22 36 (SUTURE) ×2 IMPLANT
SUT GORETEX CV4 TH-18 (SUTURE) ×2 IMPLANT
SUT MNCRL AB 3-0 PS2 18 (SUTURE) ×6 IMPLANT
SUT PDS AB 1 CTX 36 (SUTURE) ×6 IMPLANT
SUT PROLENE 3 0 SH DA (SUTURE) ×8 IMPLANT
SUT PROLENE 3 0 SH1 36 (SUTURE) ×8 IMPLANT
SUT PROLENE 4 0 RB 1 (SUTURE) ×27
SUT PROLENE 4 0 SH DA (SUTURE) ×2 IMPLANT
SUT PROLENE 4-0 RB1 .5 CRCL 36 (SUTURE) IMPLANT
SUT PROLENE 5 0 C 1 36 (SUTURE) ×7 IMPLANT
SUT PROLENE 6 0 C 1 30 (SUTURE) ×5 IMPLANT
SUT SILK  1 MH (SUTURE) ×4
SUT SILK 1 MH (SUTURE) ×2 IMPLANT
SUT SILK 2 0 SH CR/8 (SUTURE) IMPLANT
SUT SILK 3 0 SH CR/8 (SUTURE) IMPLANT
SUT STEEL 6MS V (SUTURE) IMPLANT
SUT TEM PAC WIRE 2 0 SH (SUTURE) ×5 IMPLANT
SUT VIC AB 2-0 CTX 27 (SUTURE) ×2 IMPLANT
SUT VIC AB 3-0 SH 8-18 (SUTURE) ×4 IMPLANT
SUTURE E-PAK OPEN HEART (SUTURE) ×3 IMPLANT
SYR 10ML KIT SKIN ADHESIVE (MISCELLANEOUS) IMPLANT
SYSTEM SAHARA CHEST DRAIN ATS (WOUND CARE) ×4 IMPLANT
TOWEL GREEN STERILE FF (TOWEL DISPOSABLE) ×6 IMPLANT
TOWEL OR 17X24 6PK STRL BLUE (TOWEL DISPOSABLE) ×2 IMPLANT
TOWEL OR 17X26 10 PK STRL BLUE (TOWEL DISPOSABLE) ×6 IMPLANT
TRAY FOLEY SILVER 14FR TEMP (SET/KITS/TRAYS/PACK) ×3 IMPLANT
TRAY FOLEY SILVER 16FR TEMP (SET/KITS/TRAYS/PACK) ×2 IMPLANT
TROCAR XCEL BLADELESS 5X75MML (TROCAR) IMPLANT
TROCAR XCEL NON-BLD 11X100MML (ENDOMECHANICALS) ×2 IMPLANT
TUBE CONNECTING 20X1/4 (TUBING) ×1 IMPLANT
TUBE SUCT INTRACARD DLP 20F (MISCELLANEOUS) ×3 IMPLANT
UNDERPAD 30X30 (UNDERPADS AND DIAPERS) ×5 IMPLANT
VALVE AORTIC SZ 21 (Prosthesis & Implant Heart) ×1 IMPLANT
VENT LEFT HEART 12002 (CATHETERS) ×3
WATER STERILE IRR 1000ML POUR (IV SOLUTION) ×10 IMPLANT
YANKAUER SUCT BULB TIP NO VENT (SUCTIONS) ×1 IMPLANT

## 2017-01-01 NOTE — Op Note (Signed)
CARDIOTHORACIC SURGERY OPERATIVE NOTE  Date of Procedure:  01/01/2017  Preoperative Diagnosis: Severe Aortic Stenosis   Postoperative Diagnosis: Same   Procedure:    Minimally Invasive Aortic Valve Replacement Partial Upper Hemi-sternotomy Medtronic Freestyle Porcine Aortic Root Graft (size 21mm, model #995, serial #[id number]) Reimplantation of Left Main and Right Coronary Arteries   Surgeon: Salvatore Decent. 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 aortic stenosis  Relatively small sized aortic annulus requiring root enlargement/replacement  Normal LV systolic function  Severe LV hypertrophy with significant diastolic dysfunction  Mild to moderate (2+) central tricuspid regurgitation           BRIEF CLINICAL NOTE AND INDICATIONS FOR SURGERY  Patient is a 61 year old morbidly obese African-American female with history of bicuspid aortic valve with severe aortic stenosis, chronic diastolic congestive heart failure, hypertension, obstructive sleep apnea, 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 The patient states that she was told she had a heart murmur during childhood and has known of the presence of a heart murmur for all of her adult life. However, she never had a formal cardiacevaluation until April 2017 when she was hospitalized with acute exacerbation of chronic shortness of breath. BNP was elevated and she ruled out for acute myocardial infarction. She was notably anemic at the time with hemoglobin 7.2 and Hemoccult positive stool. Symptoms resolved with diuretic therapy and transfusion of packed red blood cells. Echocardiogram performed at that time revealed normal left ventricular systolic function with  severe aortic stenosis. She will underwent EGD and colonoscopy which did not show any active bleeding. She was noted to have diverticulosis, internal hemorrhoids, and nonbleeding erosive gastropathy. She was also noted to have deep venous thrombosis in the right lower leg with history of DVT in the remote past. She was started on anticoagulation using Eliquis. The patient refused to proceed with further cardiac workup to consider aortic valve replacement at that time and has been followed ever since by Dr. Katrinka Blazing. She was seen in follow-up on 12/11/2016 at which time she complained of significant progression of symptoms of shortness of breath including orthopnea, PND, and palpitations with recumbency. Repeat echocardiogram revealed further progression in severity of aortic stenosis with peak velocity across aortic valve measured close to 6 m/s. She was admitted to the hospital for intravenous diuretic therapy for treatment of acute exacerbation of chronic diastolic congestive heart failure. During her hospitalization she underwent diagnostic cardiac catheterization which was notable for the absence of significant coronary artery disease. Cardiothoracic surgical consultation was requested.  The patient has been seen in consultation and counseled at length regarding the indications, risks and potential benefits of surgery.  All questions have been answered, and the patient provides full informed consent for the operation as described.    DETAILS OF THE OPERATIVE PROCEDURE  Preparation:  The patient is brought to the operating room on the above mentioned date and central monitoring was established by the anesthesia team including placement of Swan-Ganz catheter through the left internal jugular vein.  A radial arterial line is placed. The patient is placed in the supine position on the operating table.  Intravenous antibiotics are administered. General endotracheal anesthesia is induced uneventfully. The  patient is initially intubated using a dual lumen endotracheal tube.  A Foley catheter is placed.  Baseline  transesophageal echocardiogram was performed.  Findings were notable for critical aortic stenosis.  The aortic valve was bicuspid with a single fused raphe between the left and right leaflets.  There was normal LV systolic function with severe LV hypertrophy.  There was mild to moderate (2+) tricuspid regurgitation.  The patient is placed in the supine position with their neck gently extended and turned to the left.   The patient's right neck, chest, abdomen, both groins, and both lower extremities are prepared and draped in a sterile manner. A time out procedure is performed.   Surgical Approach:  A partial upper midline hemisternotomy incision is performed.  The sternum is divided in the midline and "J'd" into the 4th intercostal space.  The pericardium is opened.  The ascending aorta is normal in appearance although relatively short in length.   Extracorporeal Cardiopulmonary Bypass and Myocardial Protection:  The right common femoral vein is cannulated using the Seldinger technique under ultrasound guidance and a guidewire advanced under TEE guidance through the right atrium into the superior vena cava. The patient is heparinized systemically.  The ascending aorta is cannulated along the anterior surface of the proximal transverse aortic arch.  The femoral vein is cannulated with a long 22 French femoral venous cannula. The right common femoral artery is cannulated with Seldinger technique and a flexible guidewire is advanced until it can be appreciated intraluminally in the descending thoracic aorta on transesophageal echocardiogram. The femoral artery is cannulated with an 18 French femoral arterial cannula.  Adequate heparinization is verified.   A retrograde cardioplegia cannula is placed through the right atrium into the coronary sinus using transesophageal echocardiogram guidance.     The entire pre-bypass portion of the operation was notable for stable hemodynamics.  Cardiopulmonary bypass was begun.  Vacuum assist venous drainage is utilized.  A second venous cannula is placed directly into the superior vena cava.  A left ventricular vent is placed through the right superior pulmonary vein.  An antegrade cardioplegia cannula is placed in the ascending aorta.    The patient is cooled to 32C systemic temperature.  The aortic cross clamp is applied and cardioplegia is delivered initially in an antegrade fashion through the aortic root using modified [place] cold blood cardioplegia (KBC protocol).   The initial cardioplegic arrest is rapid with early diastolic arrest.  Approximately 1200 mL of the arresting dose was administered antegrade and the remainder was given retrograde through the coronary sinus catheter.  Repeat doses of cardioplegia are administered at 90 minutes and every 30 minutes thereafter in order to maintain completely flat electrocardiogram.  Myocardial protection was felt to be excellent.    Aortic Root Replacement:  A low transverse aortotomy incision was performed approximately 1 cm above the right coronary artery.  The aortic valve was inspected and notable for bicuspid aortic valve severe aortic stenosis.  There was a single fused raphe (Sievers type I) between the left and right coronary leaflets.  The aortic valve leaflets were excised sharply and the aortic annulus decalcified.  Decalcification was notably straightforward.  The aortic annulus was sized and notably very small, to small to accept a 21 mm stented bioprosthetic tissue valve or even a low profile mechanical prosthesis.  A decision is made to proceed with aortic root replacement in an effort to enlarge the aortic root is much as possible.  The left main and right coronary arteries are each mobilized on separate buttons of surrounding aortic tissue. The remainder of the aortic root is  excised.  The aortic root is sized to accept a 21 mm stentless porcine aortic root graft.  A Medtronic Freestyle porcine aortic root graft (size 21 mm, model # 995, serial # [id number]) is rinsed per manufacture guidelines and prepared for implantation. The proximal suture line is performed using interrupted simple 4-0 Ethibond sutures. The left main and the right coronary arteries are each reimplanted using running 5-0 Prolene suture after cutting circular defects to appropriate size and shape in the corresponding sinuses of Valsalva.  The distal suture line of the aortic root graft is sewn to the ascending aorta in end-to-end fashion with running 4-0 Prolene suture.   Procedure Completion:  One final dose of warm retrograde "reanimation dose" cardioplegia was administered retrograde through the coronary sinus catheter while all air was evacuated through the aortic root.  The aortic cross clamp was removed after a total cross clamp time of 146 minutes.  Epicardial pacing wires are fixed to the right ventricular outflow tract and to the right atrial appendage. The patient is rewarmed to 37C temperature.  The pericardial sac was drained using a 32 French Bard drain placed through the anterior port incision.  The left ventricular vent was removed.  The SVC cannula was removed.   The patient is weaned and disconnected from cardiopulmonary bypass.  The patient's rhythm at separation from bypass was AV paced.  The patient was weaned from bypass without any inotropic support. Total cardiopulmonary bypass time for the operation was 204 minutes.    Followup transesophageal echocardiogram performed after separation from bypass revealed a normal functioning aortic valve with no aortic insufficiency.  Mean gradient across the aortic valve was estimated 5 mmHg  Left ventricular function was unchanged from preoperatively.  There was mild RV dysfunction and moderated (2+) tricuspid regurgitation.  Low dose milrinone infusion was  begun.  The aortic cannula was removed.  Protamine was administered to reverse the anticoagulation.  The venous cannula was removed uneventfully and manual pressure held on the right groin for 30 minutes.  The right pleural space was drained using a 32 French Bard drain placed through a separate stab incision in the right chest.   The post-bypass portion of the operation was notable for stable rhythm and hemodynamics.   No blood products were administered during the operation.   Disposition:  The patient tolerated the procedure well.  The patient was reintubated using a single lumen endotracheal tube and subsequently transported to the surgical intensive care unit in stable condition. There were no intraoperative complications. All sponge instrument and needle counts are verified correct at completion of the operation.     Salvatore Decent. Cornelius Moras MD 01/01/2017 3:02 PM

## 2017-01-01 NOTE — Progress Notes (Signed)
Rapid wean protocol attempt #2 initiated by RT and this RN. Will continue to monitor.  Herma ArdMOSELEY, Abdi Husak F, RN

## 2017-01-01 NOTE — Progress Notes (Signed)
Dr. Cornelius Moraswen called this RN for update on pt. Informed that pt having some ectopy while on pacer. Dr. Cornelius Moraswen requested pacer turned off. Pt is NSR in 80s under pacer, BP tolerating well. Will leave off at this time and continue to monitor closely.  Herma ArdMOSELEY, Lavere Shinsky F, RN

## 2017-01-01 NOTE — Progress Notes (Signed)
      [address]       [place] [id number]             [phone]      POD # 1 AVR/ aortic root replacement with Freestyle porcine  BP 132/63   Pulse 88   Temp (!) 96.1 F (35.6 C)   Resp 10   Ht 5\' 5"  (1.651 m)   Wt 275 lb (124.7 kg)   LMP  (LMP Unknown)   SpO2 96%   BMI 45.76 kg/m   CI= 2.1 on 3 mcg/kg/ min of dopamine   Intake/Output Summary (Last 24 hours) at 01/01/17 1722 Last data filed at 01/01/17 1445  Gross per 24 hour  Intake             3590 ml  Output             2480 ml  Net             1110 ml   Minimal CT output  Doing well early postop  Viviann SpareSteven C. Dorris FetchHendrickson, MD [facility] [phone]

## 2017-01-01 NOTE — Progress Notes (Signed)
Dr. Dorris FetchHendrickson made aware of pt's ABG, received order to give 1 amp bicarb and reattempt wean. Will implement and continue to monitor.  Herma ArdMOSELEY, Jerrick Farve F, RN

## 2017-01-01 NOTE — Progress Notes (Signed)
Dr. Dorris FetchHendrickson made aware of pt's second ABG and respiratory mechanics. Orders received to give 1 amp bicarb and extubate. RT made aware. Will implement and continue to monitor.  Herma ArdMOSELEY, Estalene Bergey F, RN

## 2017-01-01 NOTE — Transfer of Care (Signed)
Immediate Anesthesia Transfer of Care Note  Patient: Christine Hicks  Procedure(s) Performed: Procedure(s): MINIMALLY INVASIVE AORTIC VALVE REPLACEMENT (AVR) (N/A) ASCENDING AORTIC ROOT REPLACEMENT (N/A) TRANSESOPHAGEAL ECHOCARDIOGRAM (TEE) (N/A)  Patient Location: SICU  Anesthesia Type:General  Level of Consciousness: Patient remains intubated per anesthesia plan  Airway & Oxygen Therapy: Patient remains intubated per anesthesia plan and Patient placed on Ventilator (see vital sign flow sheet for setting)  Post-op Assessment: Report given to RN and Post -op Vital signs reviewed and stable  Post vital signs: Reviewed and stable  Last Vitals:  Vitals:   01/01/17 0557 01/01/17 1530  BP: 132/63   Pulse: 79 80  Resp: 18 18  Temp: 36.7 C     Last Pain:  Vitals:   01/01/17 0557  TempSrc: Oral         Complications: No apparent anesthesia complications

## 2017-01-01 NOTE — Procedures (Signed)
Extubation Procedure Note  Patient Details:   Name: Christine Hicks DOB: [name]3/12/1955 MRN: [id number]   Airway Documentation:  Airway (Active)  Secured at (cm) 20 cm 01/01/2017  8:29 PM  Measured From Lips 01/01/2017  8:29 PM  Secured Location Right 01/01/2017  8:29 PM  Secured By Pink Tape 01/01/2017  8:29 PM  Site Condition Dry 01/01/2017  8:29 PM    Evaluation  O2 sats: stable throughout Complications: No apparent complications Patient did tolerate procedure well. Bilateral Breath Sounds: Clear, Diminished   Yes   Patient achieved NIF -22 and FVC 700ml. Patient extubated to 6L [place] and is able to verbalize name. Patient did IS 375ml. Vitals are stable.  Romona CurlsJoseph R Deshanda Molitor 01/01/2017, 11:16 PM

## 2017-01-01 NOTE — H&P (Signed)
[address]       [place] [id number]             [id number]          CARDIOTHORACIC SURGERY HISTORY AND PHYSICAL EXAM  Referring Provider is Lyn RecordsSmith, Henry W, MD PCP is Billee CashingMcKenzie, [name], MD      Chief Complaint  Patient presents with  . 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  The patient states that she was told she had a heart murmur during childhood and has known of the presence of a heart murmur for all of her adult life.  However, she never had a formal cardiac evaluation until April 2017 when she was hospitalized with acute exacerbation of chronic shortness of breath.  BNP was elevated and she ruled out for acute myocardial infarction. She was notably anemic at the time with hemoglobin 7.2 and Hemoccult positive stool. Symptoms resolved with diuretic therapy and transfusion of packed red blood cells.  Echocardiogram performed at that time revealed normal left ventricular systolic function with severe aortic stenosis.  She will underwent EGD and colonoscopy which did not show any active bleeding. She was noted to have diverticulosis, internal hemorrhoids, and nonbleeding erosive gastropathy. She was also noted to have deep venous thrombosis in the right lower leg with history of DVT in the remote past. She was started on anticoagulation using Eliquis.  The patient refused to proceed with further cardiac workup to consider aortic valve replacement at that time  and has been followed ever since by Dr. Katrinka BlazingSmith.  She was seen in follow-up on 12/11/2016 at which time she complained of significant progression of symptoms of shortness of breath including orthopnea, PND, and palpitations with recumbency. Repeat echocardiogram revealed further progression in severity of aortic stenosis with peak velocity across aortic valve measured close to 6 m/s.  She was admitted to the hospital for intravenous diuretic therapy for treatment of acute exacerbation of chronic diastolic congestive heart failure. During her hospitalization she underwent diagnostic cardiac catheterization which was notable for the absence of significant coronary artery disease.  Cardiothoracic surgical consultation was requested.  The patient is married and [place] locally in [place] her husband and son. She has been retired and drawn disability since approximately 2006 at which time she was hospitalized for a prolonged period time with complications related to ventral hernia repair. She lives a very sedentary lifestyle and has been morbidly obese for most of her adult life. She admits that she does not get around much physically at all but she is ambulatory and walks using a cane. She describes a greater than one-year history of progressive symptoms of exertional shortness of breath which have been increasing recently. She now gets short of breath with minimal activity and she has had some occasional resting shortness of breath. She cannot lay flat in bed. She has occasional palpitations. She denies any history of dizzy spells or syncope. She has never had any chest pain or chest tightness either with activity or at rest. She has chronic lower extremity  edema. Since her recent hospital discharge she reports that her shortness of breath has progressed a little bit and she was seen in follow-up earlier today at [facility] where her diuretic dose was increased.    Past Medical History:  Diagnosis Date   . Acute on chronic diastolic congestive heart failure (HCC)   . Anemia, iron deficiency 12/11/2016   negative egd/colonoscopy 11/16/2015  . Aortic stenosis, severe   . Arthritis   . Bicuspid aortic valve 12/11/2016  . DVT (deep venous thrombosis) (HCC)    RLE DVT 11/12/15  . GERD (gastroesophageal reflux disease)   . Heart murmur   . Hypertension 12/11/2016  . Insomnia   . Morbid obesity with BMI of 45.0-49.9, adult (HCC)   . PONV (postoperative nausea and vomiting)    took a long time to wake up  . Thyroid disease 12/11/2016  . Wears glasses   . Wears partial dentures     Past Surgical History:  Procedure Laterality Date  . 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 CARDIAC CATHETERIZATION     12/16/16  . COLONOSCOPY N/A 11/16/2015   Procedure: COLONOSCOPY;  Surgeon: Iva Boop, MD;  Location: [facility] ENDOSCOPY;  Service: Endoscopy;  Laterality: N/A;  . ESOPHAGOGASTRODUODENOSCOPY N/A 11/16/2015   Procedure: ESOPHAGOGASTRODUODENOSCOPY (EGD);  Surgeon: Iva Boop, MD;  Location[facility] ENDOSCOPY;  Service: Endoscopy;  Laterality: N/A;  . HERNIA REPAIR    . MULTIPLE TOOTH EXTRACTIONS    . Right leg cellulitis surgery      Family History  Problem Relation Age of Onset  . Cardiomyopathy Son   . Heart attack Mother   . Emphysema Father   . Lung cancer Maternal Aunt     Social History Social History  Substance Use Topics  . Smoking status: Former Smoker    Types: Cigarettes  . Smokeless tobacco: Never Used     Comment: quit smoking cigarettes > 25 years ago  . Alcohol use No    Prior to Admission medications   Medication Sig Start Date End Date Taking? Authorizing Provider  amLODipine (NORVASC) 5 MG tablet Take 1 tablet (5 mg total) by mouth daily. 12/26/16 03/26/17 Yes Leone Brand, NP  ferrous sulfate 325 (65 FE) MG tablet Take 1 tablet (325 mg total) by mouth 2 (two) times daily with a meal. 11/18/15  Yes Ghimire, Werner Lean, MD  furosemide  (LASIX) 40 MG tablet Take 1 tablet (40 mg total) by mouth 2 (two) times daily. 12/26/16  Yes Leone Brand, NP  levothyroxine (SYNTHROID, LEVOTHROID) 150 MCG tablet Take 1 tablet (150 mcg total) by mouth daily before breakfast. 12/18/16  Yes Berton Bon, NP  pantoprazole (PROTONIX) 40 MG tablet TAKE 1 TABLET (40 MG TOTAL) BY MOUTH DAILY. 12/04/16  Yes Lyn Records, MD  potassium chloride (KLOR-CON M10) 10 MEQ tablet Take 1 tablet (10 mEq total) by mouth daily. Patient taking differently: Take 10 mEq by mouth daily with lunch.  12/11/16  Yes Lyn Records, MD  chlorhexidine (PERIDEX) 0.12 % solution Rinse with 15 mls twice daily for 30 seconds. Use after breakfast and at bedtime. Spit out excess. Do not swallow. 12/29/16   Charlynne Pander, DDS  enoxaparin (LOVENOX) 80 MG/0.8ML injection Inject 0.8 mLs (80 mg total) into the skin daily. INJECT 12/29/16 AND 12/30/16 12/29/16 12/31/16  Purcell Nails, MD    Allergies  Allergen Reactions  . No Known Allergies     Review of  Systems:              General:                      normal appetite, decreased energy, no weight gain, no weight loss, no fever             Cardiac:                       no chest pain with exertion, no chest pain at rest, + SOB with exertion, + intermittent resting SOB, + PND, + orthopnea, + palpitations, no arrhythmia, no atrial fibrillation, + LE edema, no dizzy spells, no syncope             Respiratory:                 + chronic shortness of breath, no home oxygen, no productive cough, no dry cough, no bronchitis, no wheezing, no hemoptysis, no asthma, no pain with inspiration or cough, + sleep apnea, no CPAP at night             GI:                               occasional difficulty swallowing, + reflux, no frequent heartburn, no hiatal hernia, no abdominal pain, no constipation, no diarrhea, no hematochezia, no hematemesis, no melena             GU:                              no dysuria,  no frequency, no urinary tract  infection, no hematuria, no kidney stones, no kidney disease             Vascular:                     no pain suggestive of claudication, + pain in feet, + leg cramps, no varicose veins, + DVT, no non-healing foot ulcer             Neuro:                         no stroke, no TIA's, no seizures, no headaches, no temporary blindness one eye,  no slurred speech, no peripheral neuropathy, no chronic pain, mild instability of gait, no memory/cognitive dysfunction             Musculoskeletal:         + arthritis particularly in knees, + joint swelling, no myalgias, + difficulty walking - uses a cane for stability, diminished mobility              Skin:                            no rash, no itching, no skin infections, no pressure sores or ulcerations             Psych:                         no anxiety, no depression, no nervousness, no unusual recent stress             Eyes:  no blurry vision, no floaters, no recent vision changes, + wears glasses or contacts             ENT:                            no hearing loss, no loose or painful teeth but several teeth that are broken off, + partial dentures, last saw dentist > 2 years ago             Hematologic:               + easy bruising, no abnormal bleeding, no clotting disorder, no frequent epistaxis             Endocrine:                   no diabetes, does not check CBG's at home                                                       Physical Exam:              BP (!) 142/86 (BP Location: Right Arm, Patient Position: Sitting, Cuff Size: Large)   Pulse 75   Resp 16   Ht 5\' 5"  (1.651 m)   Wt 280 lb (127 kg)   LMP  (LMP Unknown)   SpO2 99% Comment: ON RA  BMI 46.59 kg/m              General:                      Morbidly obese with limited mobility, o/w well-appearing             HEENT:                       Unremarkable              Neck:                           no JVD, no bruits, no adenopathy               Chest:                          clear to auscultation, symmetrical breath sounds, no wheezes, no rhonchi              CV:                              RRR, grade III/VI crescendo/decrescendo murmur heard best at LSB,  no diastolic murmur             Abdomen:                    soft, non-tender, no masses, + ventral hernia             Extremities:                 warm, well-perfused, pulses not palpable, + bilateral LE edema             Rectal/GU  Deferred             Neuro:                         Grossly non-focal and symmetrical throughout             Skin:                            Clean and dry, no rashes, no breakdown   Diagnostic Tests:  Transthoracic Echocardiography  Patient: Christine Hicks, Christine Hicks MR #: [id number] Study Date: 11/20/2016 Gender: F Age: 89 Height: 165.1 cm Weight: 129.9 kg BSA: 2.52 m^2 Pt. Status: Room:  Hilma Favors, MD REFERRING Lyn Records, MD ATTENDING Default, Provider [phone] PERFORMING [place], [place]ld SONOGRAPHER Quentin Ore, RVT, RDCS, RDMS  cc:  ------------------------------------------------------------------- LV EF: 55% - 60%  ------------------------------------------------------------------- History: PMH: Dyspnea. Aortic valve disease. Risk factors: Former tobacco use. Diabetes mellitus. Obese.  ------------------------------------------------------------------- Study Conclusions  - Left ventricle: The cavity size was normal. Wall thickness was increased in a pattern of moderate LVH. Systolic function was normal. The estimated ejection fraction was in the range of 55% to 60%. Wall motion was normal; there were no regional wall motion abnormalities. Doppler parameters are consistent with abnormal left ventricular relaxation (grade 1 diastolic dysfunction). - Aortic valve: There was severe stenosis. Mean gradient (S): 82  mm Hg. Valve area (VTI): 0.42 cm^2. Valve area (Vmean): 0.43 cm^2. - Left atrium: The atrium was mildly dilated. - Right atrium: The atrium was mildly dilated. - Tricuspid valve: There was moderate regurgitation. - Pulmonary arteries: Systolic pressure was mildly to moderately increased. PA peak pressure: 45 mm Hg (S).  Impressions:  - Aortic stenosis parameters have progressed compared to previous echo 10/2015.  ------------------------------------------------------------------- Study data: The previous study was not available, so comparison was made to the report of April 2017. Study status: Routine. Procedure: Transthoracic echocardiography. Image quality was fair. The study was technically difficult, as a result of body habitus. Study completion: There were no complications. Transthoracic echocardiography. M-mode, complete 2D, spectral Doppler, and color Doppler. Birthdate: Patient birthdate: July 11, 1956. Age: Patient is 61 yr old. Sex: Gender: female. BMI: 47.7 kg/m^2. Blood pressure: 142/82 Patient status: Outpatient. Study date: Study date: 11/20/2016. Study time: 12:09 PM.  -------------------------------------------------------------------  ------------------------------------------------------------------- Left ventricle: The cavity size was normal. Wall thickness was increased in a pattern of moderate LVH. Systolic function was normal. The estimated ejection fraction was in the range of 55% to 60%. Wall motion was normal; there were no regional wall motion abnormalities. Doppler parameters are consistent with abnormal left ventricular relaxation (grade 1 diastolic dysfunction).  ------------------------------------------------------------------- Aortic valve: Moderately calcified leaflets. Doppler: There was severe stenosis. There was no regurgitation. VTI ratio of LVOT to aortic valve: 0.15. Valve area (VTI): 0.42 cm^2.  Indexed valve area (VTI): 0.17 cm^2/m^2. Mean velocity ratio of LVOT to aortic valve: 0.15. Valve area (Vmean): 0.43 cm^2. Indexed valve area (Vmean): 0.17 cm^2/m^2. Mean gradient (S): 82 mm Hg. Peak gradient (S): 143 mm Hg.  ------------------------------------------------------------------- Aorta: Aortic root: The aortic root was normal in size. Ascending aorta: The ascending aorta was normal in size.  ------------------------------------------------------------------- Mitral valve: Structurally normal valve. Leaflet separation was normal. Doppler: Transvalvular velocity was within the normal range. There was no evidence for stenosis. There was trivial regurgitation. Valve area by pressure half-time: 2.56 cm^2. Indexed valve area by pressure half-time: 1.02 cm^2/m^2. Peak gradient (D): 2  mm Hg.  ------------------------------------------------------------------- Left atrium: The atrium was mildly dilated.  ------------------------------------------------------------------- Right ventricle: The cavity size was normal. Wall thickness was normal. Systolic function was normal.  ------------------------------------------------------------------- Pulmonic valve: Poorly visualized. Doppler: There was no significant regurgitation.  ------------------------------------------------------------------- Tricuspid valve: Structurally normal valve. Leaflet separation was normal. Doppler: Transvalvular velocity was within the normal range. There was moderate regurgitation.  ------------------------------------------------------------------- Pulmonary artery: Systolic pressure was mildly to moderately increased.  ------------------------------------------------------------------- Right atrium: The atrium was mildly dilated.  ------------------------------------------------------------------- Pericardium: There was no pericardial  effusion.  ------------------------------------------------------------------- Systemic veins: Inferior vena cava: The vessel was normal in size. The respirophasic diameter changes were in the normal range (>= 50%), consistent with normal central venous pressure.  ------------------------------------------------------------------- Measurements  Left ventricle Value Reference LV ID, ED, PLAX chordal 43 mm 43 - 52 LV PW thickness, ED 15 mm ---------- IVS/LV PW ratio, ED 1.13 <=1.3 Stroke volume, 2D 63 ml ---------- Stroke volume/bsa, 2D 25 ml/m^2 ---------- LV e&', lateral 6.64 cm/s ---------- LV E/e&', lateral 11.84 ---------- LV e&', medial 5.77 cm/s ---------- LV E/e&', medial 13.62 ---------- LV e&', average 6.21 cm/s ---------- LV E/e&', average 12.67 ----------  Ventricular septum Value Reference IVS thickness, ED 17 mm ----------  LVOT Value Reference LVOT ID, S 19 mm ---------- LVOT area 2.84 cm^2 ---------- LVOT ID 19 mm ---------- LVOT mean velocity, S 65.4 cm/s ---------- LVOT VTI, S 22.2 cm ---------- Stroke volume (SV), LVOT DP 62.9 ml ---------- Stroke index (SV/bsa), LVOT DP 25 ml/m^2 ----------  Aortic valve Value  Reference Aortic valve peak velocity, S 597 cm/s ---------- Aortic valve mean velocity, S 427 cm/s ---------- Aortic valve VTI, S 151 cm ---------- Aortic mean gradient, S 82 mm Hg ---------- Aortic peak gradient, S 143 mm Hg ---------- VTI ratio, LVOT/AV 0.15 ---------- Aortic valve area, VTI 0.42 cm^2 ---------- Aortic valve area/bsa, VTI 0.17 cm^2/m^2 ---------- Velocity ratio, mean, LVOT/AV 0.15 ---------- Aortic valve area, mean velocity 0.43 cm^2 ---------- Aortic valve area/bsa, mean 0.17 cm^2/m^2 ---------- velocity  Aorta Value Reference Aortic root ID, ED 26 mm ---------- Ascending aorta ID, A-P, S 29 mm ----------  Left atrium Value Reference LA ID, A-P, ES 34 mm ---------- LA ID/bsa, A-P 1.35 cm/m^2 <=2.2 LA volume, S 71 ml ---------- LA volume/bsa, S 28.2 ml/m^2 ---------- LA volume, ES, 1-p A4C 65.7 ml ---------- LA volume/bsa, ES, 1-p A4C 26.1 ml/m^2 ---------- LA volume, ES, 1-p A2C 76.5 ml ---------- LA volume/bsa, ES, 1-p A2C 30.4 ml/m^2 ----------  Mitral valve Value Reference Mitral E-wave peak velocity 78.6 cm/s ---------- Mitral A-wave peak velocity 96.5 cm/s ---------- Mitral deceleration time (H) 292 ms 150 - 230 Mitral pressure half-time 86 ms  ---------- Mitral peak gradient, D 2 mm Hg ---------- Mitral E/A ratio, peak 0.8 ---------- Mitral valve area, PHT, DP 2.56 cm^2 ---------- Mitral valve area/bsa, PHT, DP 1.02 cm^2/m^2 ----------  Pulmonary arteries Value Reference PA pressure, S, DP (H) 45 mm Hg <=30  Right atrium Value Reference RA ID, S-I, ES, A4C 46.7 mm 34 - 49 RA area, ES, A4C 13.6 cm^2 8.3 - 19.5 RA volume, ES, A/L 33.4 ml ---------- RA volume/bsa, ES, A/L 13.3 ml/m^2 ----------  Right ventricle Value Reference TAPSE 29 mm ---------- RV s&', lateral, S 10.4 cm/s ----------  Pulmonic valve Value Reference Pulmonic valve peak velocity, S 78.1 cm/s ----------  Legend: (L) and (H) mark values outside specified reference range.  ------------------------------------------------------------------- Prepared and Electronically Authenticated by  Almond Lint, MD 2018-04-26T14:35:22   Right Heart Cath and  Coronary Angiography  Conclusion    Critical aortic stenosis, on a bicuspid aortic valve with documented greater than 120 mm peak to peak and 85 mm mean gradient by recent echo.  Normal coronary arteries.  Normal pulmonary pressures. The capillary wedge pressure is 14 mmHg mean.   Acute on chronic diastolic heart failure, resolved after inpatient diuresis over the past 4 days.   RECOMMENDATIONS:   Heart valve team workup followed by TAVR (preferrable) vs SAVR.  Resolution of heart failure has occurred and recommend starting furosemide 40  mg by mouth daily.  Home in a.m. if no postprocedure complications.  Indications   Critical aortic valve stenosis [I35.0 (ICD-10-CM)]  Acute on chronic diastolic heart failure (HCC) [I50.33 (ICD-10-CM)]  Procedural Details/Technique   Technical Details The right radial area was sterilely prepped and draped. Intravenous sedation with Versed and fentanyl was administered. 1% Xylocaine was infiltrated to achieve local analgesia. Using real-time vascular ultrasound, a double wall stick with an angiocath was utilized to obtain intra-arterial access. The modified Seldinger technique was used to place a 61F " Slender" sheath in the right radial artery. Weight based heparin was administered. Coronary angiography was done using 5 F catheters. Right coronary angiography was performed with a JR4. Cine fluoroscopy demonstrated heavy aortic valve calcification. With the Judkins right catheter, a couple attempts to cross the valve with a 0.035 straight wire was unsuccessful. Knowing the valve is critically obstructed further attempts were not made. Left coronary angiography was performed with a JL 3.5 cm. A supravalvular aortic saturation was obtained.  Right heart catheterization was performed by exchanging a left antecubital angiocath IV for a 5 French brachial sheath. 1% Xylocaine local infiltration at the IV site was given prior to sheath exchange. The double glove technique was used. The modified Seldinger technique was employed. After sheath insertion, right heart cath was performed using a 5 French balloon tipped catheter. Pressures were recorded in each chamber. A main pulmonary artery O2 saturation was obtained. The pulmonary wedge pressure was also measured.   Hemostasis was achieved using a pneumatic band.  During this procedure the patient is administered a total of Versed 3 mg and Fentanyl 100 mg to achieve and maintain moderate conscious sedation. The patient's heart rate, blood pressure, and oxygen  saturation are monitored continuously during the procedure. The period of conscious sedation is 41 minutes, of which I was present face-to-face 100% of this time.The right radial area was sterilely prepped and draped. Intravenous sedation with Versed and fentanyl was administered. 1% Xylocaine was infiltrated to achieve local analgesia. A double wall stick with an angiocath was utilized to obtain intra-arterial access. The modified Seldinger technique was used to place a 61F " Slender" sheath in the right radial artery. Weight based heparin was administered. Coronary angiography was done using 5 F catheters. Right coronary angiography was performed with a JR4. Left ventricular hemodymic recordings and angiography was done using the JR 4 catheter and hand injection. Left coronary angiography was performed with a JL 3.5 cm.  Right heart catheterization was performed by exchanging an antecubital angiocath IV for a 5 French brachial sheath. 1% Xylocaine local infiltration at the IV site was given prior to sheath exchange. A double glove technique was used. The modified Seldinger technique was employed. After sheath insertion, right heart cath was performed using a 5 French balloon tipped catheter. Pressures were recorded in each chamber. A main pulmonary artery O2 saturation was obtained. I wedge pressure was also measured.   Hemostasis was  achieved using a pneumatic band.  During this procedure the patient is administered a total of Versed 3 mg and Fentanyl 100 mg to achieve and maintain moderate conscious sedation. The patient's heart rate, blood pressure, and oxygen saturation are monitored continuously during the procedure. The period of conscious sedation is 41 minutes, of which I was present face-to-face 100% of this time.   Estimated blood loss <50 mL.  During this procedure the patient was administered the following to achieve and maintain moderate conscious sedation: Versed 3 mg, Fentanyl 100 mcg,  while the patient's heart rate, blood pressure, and oxygen saturation were continuously monitored. The period of conscious sedation was 41 minutes, of which I was present face-to-face 100% of this time.    Coronary Findings   Dominance: Right  Left Anterior Descending  Lateral First Diagonal Branch  Vessel is small in size.  Ramus Intermedius  Vessel is small.  Left Circumflex  Lateral First Obtuse Marginal Branch  Vessel is small in size.  Second Obtuse Marginal Branch  Vessel is small in size.  Third Obtuse Marginal Branch  Vessel is small in size.  Right Heart   Right Heart Pressures Hemodynamic findings consistent with pulmonary hypertension.    Left Heart   Aortic Valve There is severe aortic valve stenosis. The aortic valve is calcified. There is restricted aortic valve motion.    Coronary Diagrams   Diagnostic Diagram       Implants        No implant documentation for this case.  PACS Images   Show images for Cardiac catheterization   Link to Procedure Log   Procedure Log    Hemo Data    Most Recent Value  Fick Cardiac Output 6.12 L/min  Fick Cardiac Output Index 2.7 (L/min)/BSA  RA A Wave 3 mmHg  RA V Wave 3 mmHg  RA Mean 2 mmHg  RV Systolic Pressure 34 mmHg  RV Diastolic Pressure 2 mmHg  RV EDP 3 mmHg  PA Systolic Pressure 33 mmHg  PA Diastolic Pressure 14 mmHg  PA Mean 22 mmHg  PW A Wave 16 mmHg  PW V Wave 0 mmHg  PW Mean 14 mmHg  AO Systolic Pressure 143 mmHg  AO Diastolic Pressure 76 mmHg  AO Mean 100 mmHg  QP/QS 1  TPVR Index 8.15 HRUI  TSVR Index 37.06 HRUI  PVR SVR Ratio 0.08  TPVR/TSVR Ratio 0.22     Cardiac TAVR CT  TECHNIQUE: The patient was scanned on a Philips 256 scanner. A 120 kV retrospective scan was triggered in the descending thoracic aorta at 111 HU's. Gantry rotation speed was 270 msecs and collimation was .9 mm. No beta blockade or nitro were given. The 3D data set was reconstructed in 5%  intervals of the R-R cycle. Systolic and diastolic phases were analyzed on a dedicated work station using MPR, MIP and VRT modes. The patient received 80 cc of contrast.  FINDINGS: Aortic Valve: Bicuspid with non-separated left and right cusp, leaflets are severely thickened and calcified, there are moderate calcifications extending the LVOT.  Aorta: Normal size. Minimal calcifications in the aortic arch. No dissection.  Sinotubular Junction: 25 x 24 mm  Ascending Thoracic Aorta: 30 x 29 mm  Aortic Arch: 26 x 22 mm  Descending Thoracic Aorta: 23 x 22 mm  Sinus of Valsalva Measurements:  Non-coronary: 29 mm  Right -coronary: 27 mm  Left -coronary: 28 mm  Coronary Artery Height above Annulus:  Left Main: 10 mm  Right  Coronary: 20 mm  Virtual Basal Annulus Measurements:  Maximum/Minimum Diameter: 24 x 20 mm  Perimeter: 79 mm  Area: 400 mm2  Optimum Fluoroscopic Angle for Delivery: LAO 2 CAU 1  No thrombus in the left atrial appendage.  IMPRESSION: 1. Bicuspid aortic valve with non-separated left and right cusp, leaflets are severely thickened and calcified, there are moderate calcifications extending the LVOT. Annular measurements suitable for delivery of a 23 mm Edwards-SAPIEN 3 valve.  2. Optimum Fluoroscopic Angle for Delivery: LAO 2 CAU 1  3. Sufficient annulus to coronary distance.  4. No thrombus in the left atrial appendage.  The images are of suboptimal quality sec to patient's size and motion during acquisition.  Tobias Alexander   Electronically Signed By: Tobias Alexander On: 12/26/2016 10:21    CT ANGIOGRAPHY CHEST, ABDOMEN AND PELVIS  TECHNIQUE: Multidetector CT imaging through the chest, abdomen and pelvis was performed using the standard protocol during bolus administration of intravenous contrast. Multiplanar reconstructed images and MIPs were obtained and reviewed to evaluate the  vascular anatomy.  CONTRAST: 165 mL of Isovue 370  COMPARISON: Chest CT 11/11/2015.  FINDINGS: CTA CHEST FINDINGS  Cardiovascular: Heart size is mildly enlarged with concentric left ventricular hypertrophy. There is no significant pericardial fluid, thickening or pericardial calcification. Severe thickening and calcification of the aortic valve. Atherosclerotic calcifications in the thoracic aorta. No definite coronary artery calcifications are identified.  Mediastinum/Lymph Nodes: No pathologically enlarged mediastinal or hilar lymph nodes. Esophagus is unremarkable in appearance. No axillary lymphadenopathy.  Lungs/Pleura: The patchy areas of linear scarring are noted throughout the lung bases bilaterally. No acute consolidative airspace disease. No pleural effusions. No definite suspicious appearing pulmonary nodules or masses are noted.  Musculoskeletal/Soft Tissues: There are no aggressive appearing lytic or blastic lesions noted in the visualized portions of the skeleton.  CTA ABDOMEN AND PELVIS FINDINGS  Hepatobiliary: Liver is enlarged measuring 25.7 cm craniocaudally. No definite suspicious cystic or solid hepatic lesions are identified. No intra or extrahepatic biliary ductal dilatation. Gallbladder is normal in appearance.  Pancreas: No pancreatic mass. No pancreatic ductal dilatation. No pancreatic or peripancreatic fluid or inflammatory changes.  Spleen: Spleen is enlarged measuring 9.4 x 5.7 x 14.6 cm (estimated splenic volume of 391 mL).  Adrenals/Urinary Tract: Subcentimeter low-attenuation lesion in the upper pole the left kidney is too small to definitively characterize, but is statistically likely a cyst. Right kidney and bilateral adrenal glands are normal in appearance. Ptosis of both kidneys show related to downward movement of most abdominal structures related to the patient's large abdominal wall hernia. Urinary bladder is grossly  normal in appearance.  Stomach/Bowel: Stomach is normal in appearance. There is no pathologic dilatation of small bowel or colon. The patient has a very large ventral hernia containing much of the small bowel and colon. Several colonic diverticulae are noted, without surrounding inflammatory changes to suggest an acute diverticulitis at this time. The appendix is not confidently identified and may be surgically absent. Regardless, there are no inflammatory changes noted adjacent to the cecum to suggest the presence of an acute appendicitis at this time.  Vascular/Lymphatic: Aortic atherosclerosis, without evidence of aneurysm or dissection. Vascular findings and measurements pertinent to potential TAVR procedure, as detailed below. Celiac axis, superior mesenteric artery and inferior mesenteric artery are all widely patent without hemodynamically significant stenosis. Single renal arteries are widely patent bilaterally. No lymphadenopathy noted in the abdomen or pelvis.  Reproductive: Rim calcified lesion in the fundus of the uterus anteriorly, presumably a calcified  fibroid. Ovaries are unremarkable in appearance.  Other: No significant volume of ascites. No pneumoperitoneum.  Musculoskeletal: There are no aggressive appearing lytic or blastic lesions noted in the visualized portions of the skeleton.  VASCULAR MEASUREMENTS PERTINENT TO TAVR:  AORTA:  Minimal Aortic Diameter - 10 x 8 mm  Severity of Aortic Calcification - mild  RIGHT PELVIS:  Right Common Iliac Artery -  Minimal Diameter - 6.9 x 6.6 mm  Tortuosity - mild  Calcification - mild  Right External Iliac Artery -  Minimal Diameter - 5.3 x 4.9 mm  Tortuosity - mild  Calcification - mild  Right Common Femoral Artery -  Minimal Diameter - 5.1 x 4.6 mm  Tortuosity - mild  Calcification - mild  LEFT PELVIS:  Left Common Iliac Artery -  Minimal Diameter - 6.2 x 6.7  mm  Tortuosity - mild  Calcification - mild  Left External Iliac Artery -  Minimal Diameter - 5.3 x 5.0 mm  Tortuosity - mild  Calcification - mild  Left Common Femoral Artery -  Minimal Diameter - 5.5 x 4.2 mm  Tortuosity - mild  Calcification - mild  Review of the MIP images confirms the above findings.  IMPRESSION: 1. Vascular findings and measurements pertinent to potential TAVR procedure, as detailed above. This patient does not appear to have suitable pelvic arterial access on either side. 2. Severe thickening calcification of the aortic valve, compatible with the reported clinical history of severe aortic stenosis. This is associated with cardiomegaly and severe concentric left ventricular hypertrophy. 3. Massive ventral hernia containing much of the small bowel and colon, without evidence of bowel incarceration or obstruction at this time. This is also associated downward displacement of abdominal organs, most notably the kidneys bilaterally. 4. Additional incidental findings, as above.   Electronically Signed By: Trudie Reed M.D. 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 She presents with long-standing symptoms of exertional shortness of breath and fatigue consistent with chronic diastolic congestive heart  failure that have recently progressed to the point where the patient needed hospitalization last week for class IV symptoms due to acute on chronic volume overload.  I have personally reviewed the patient's recent transthoracic echocardiogram, diagnostic cardiac catheterization, and CT angiograms.  Echocardiogram confirms the presence of what appears to be a functionally bicuspid aortic valve with severe thickening, calcification, and restricted leaflet mobility involving both leaflets of the aortic valve. Peak velocity across the aortic valve approaches 6 m/s corresponding to mean transvalvular gradient estimated 82 mmHg. Transvalvular gradients and decision-making are further influenced by the fact that the patient has relatively small sized aortic root. Left ventricular systolic function remains normal although the patient likely has significant diastolic dysfunction. Diagnostic cardiac catheterization is notable for the absence of significant coronary artery disease.  Cardiac gated CT angiogram of the heart confirms the presence of a bicuspid aortic valve (Sievers type I) with fusion of the left and right cusps and relatively small annular dimensions suitable for transcatheter aortic valve replacement using a 23 mm Edwards Sapien 3 transcatheter heart valve.  I agree that there is no question the patient needs aortic valve replacement as soon as practical. Risks associated with conventional surgery will be somewhat elevated because of the patient's morbid obesity and other numerous comorbid medical problems.  However, because of the patient's relatively young age and relatively small size aortic root I feel that she would best be treated with surgical aortic valve replacement rather than transcatheter aortic valve replacement. Long-term durability of transcatheter aortic valve replacement remains unknown at this time.  Furthermore, CT angiography suggests that her valve would need to be replaced using a 23 mm  transcatheter heart valve which might leave her with some residual aortic stenosis and little room for valve in valve redo transcatheter aortic valve replacement should she develop structural valve deterioration in the future. Under the circumstances, I feel that she would probably best be treated using conventional surgical techniques with the potential benefits from use of a low-profile mechanical valve, a rapid deployment bioprosthetic tissue valve, aortic root enlargement, or aortic root replacement using a stentless porcine aortic root graft in order to minimize the risk of patient prosthesis mismatch.     Plan:  The patient and her family were counseled at length regarding treatment alternatives for management of severe aortic stenosis including continued medical therapy versus proceeding with aortic valve replacement in the near future.  The natural history of aortic stenosis was reviewed, as was long term prognosis with medical therapy alone.  Surgical options were discussed at length including conventional surgical aortic valve replacement through either a full median sternotomy or using minimally invasive techniques.  Other alternatives including rapid-deployment bioprosthetic tissue valve replacement, transcatheter aortic valve replacement, patch enlargement of the aortic root, and stentless porcine aortic root replacement were discussed.  Discussion was held comparing the relative risks of mechanical valve replacement with need for lifelong anticoagulation versus use of a bioprosthetic tissue valve and the associated potential for late structural valve deterioration and failure.  This discussion was placed in the context of the patient's particular circumstances, and as a result the patient specifically requests that their valve be replaced using a bioprosthetic tissue valve.  The potential advantages and disadvantages associated with use of a rapid-deployment bioprosthetic aortic valve were  discussed, including the risks of paravalvular leak, need for permanent pacemaker placement, and expectations for long-term durability.  The patient understands and accepts all potential associated risks of surgery including but not limited to risk of death, stroke, myocardial infarction, congestive heart failure, respiratory failure, renal failure, pneumonia, bleeding requiring blood transfusion and or reexploration, arrhythmia, heart block or bradycardia requiring permanent pacemaker, aortic dissection or other major vascular complication, pleural effusions or other delayed complications related to continued congestive heart failure, and other late complications related to valve replacement including structural valve deterioration and failure, thrombosis, endocarditis, or paravalvular leak.  We tentatively plan to proceed with surgery on 01/01/2017. The patient has been instructed to stop taking Eliquis. She will be given a prescription for Lovenox injections as a bridge prior to surgery. The patient has also been instructed to contact Dr. Michaelle Copas office if her breathing deteriorates any further between now and the time of surgery.  All of her questions have been addressed.      Salvatore Decent. Cornelius Moras, MD 12/26/2016 1:42 PM

## 2017-01-01 NOTE — Anesthesia Procedure Notes (Signed)
Central Venous Catheter Insertion Performed by: Heather RobertsSINGER, Dracen Reigle, anesthesiologist Start/End6/01/2017 7:24 AM, 01/01/2017 7:34 AM Patient location: Pre-op. Preanesthetic checklist: patient identified, IV checked, site marked, risks and benefits discussed, surgical consent, monitors and equipment checked, pre-op evaluation, timeout performed and anesthesia consent Position: Trendelenburg Lidocaine 1% used for infiltration and patient sedated Hand hygiene performed , maximum sterile barriers used  and Seldinger technique used Catheter size: 8.5 Fr Total catheter length 8. PA cath was placed.Sheath introducer Swan type:thermodilution PA Cath depth:50 Procedure performed using ultrasound guided technique. Ultrasound Notes:anatomy identified, needle tip was noted to be adjacent to the nerve/plexus identified, no ultrasound evidence of intravascular and/or intraneural injection and image(s) printed for medical record Attempts: 1 Following insertion, line sutured and dressing applied. Post procedure assessment: free fluid flow, blood return through all ports and no air  Patient tolerated the procedure well with no immediate complications.

## 2017-01-01 NOTE — Progress Notes (Signed)
Pt flipped to CPAP/PS by RT, will continue to monitor.  Herma ArdMOSELEY, Dorothye Berni F, RN

## 2017-01-01 NOTE — Anesthesia Postprocedure Evaluation (Signed)
Anesthesia Post Note  Patient: Christine Hicks  Procedure(s) Performed: Procedure(s) (LRB): MINIMALLY INVASIVE AORTIC VALVE REPLACEMENT (AVR) (N/A) ASCENDING AORTIC ROOT REPLACEMENT (N/A) TRANSESOPHAGEAL ECHOCARDIOGRAM (TEE) (N/A)     Patient location during evaluation: SICU Anesthesia Type: General Level of consciousness: sedated Pain management: pain level controlled Vital Signs Assessment: post-procedure vital signs reviewed and stable Respiratory status: patient remains intubated per anesthesia plan Cardiovascular status: stable Anesthetic complications: no    Last Vitals:  Vitals:   01/01/17 0557 01/01/17 1530  BP: 132/63   Pulse: 79 80  Resp: 18 18  Temp: 36.7 C     Last Pain:  Vitals:   01/01/17 0557  TempSrc: Oral                 Suhayla Chisom DANIEL

## 2017-01-01 NOTE — Brief Op Note (Addendum)
01/01/2017  1:32 PM  PATIENT:  [name]  61 y.o. female  PRE-OPERATIVE DIAGNOSIS:  AS  POST-OPERATIVE DIAGNOSIS:  AS  PROCEDURE:  Procedure(s): MINIMALLY INVASIVE AORTIC VALVE REPLACEMENT (AVR) (N/A) ASCENDING AORTIC ROOT REPLACEMENT (N/A) TRANSESOPHAGEAL ECHOCARDIOGRAM (TEE) (N/A)  SURGEON:    Purcell Nailslarence H Owen, MD  ASSISTANTS:  Jari Favreessa Conte, PA-C  ANESTHESIA:   Heather RobertsSinger, James, MD  CROSSCLAMP TIME:   146'  CARDIOPULMONARY BYPASS TIME: 204'  FINDINGS:  Bicuspid aortic valve (Sievers type I) with severe aortic stenosis  Relatively small sized aortic annulus  Normal LV systolic function  Severe LV hypertrophy with significant diastolic dysfunction  Mild to moderate (2+) central tricuspid regurgitation  COMPLICATIONS: None  BASELINE WEIGHT: 125 kg  PATIENT DISPOSITION:   TO SICU IN STABLE CONDITION  Purcell Nailslarence H Owen, MD 01/01/2017 3:00 PM

## 2017-01-01 NOTE — Interval H&P Note (Signed)
History and Physical Interval Note:  01/01/2017 6:08 AM  Christine Hicks  has presented today for surgery, with the diagnosis of AS  The various methods of treatment have been discussed with the patient and family. After consideration of risks, benefits and other options for treatment, the patient has consented to  Procedure(s): MINIMALLY INVASIVE AORTIC VALVE REPLACEMENT (AVR) (N/A) POSSIBLE AORTIC ROOT ENLARGEMENT (N/A) POSSIBLE ASCENDING AORTIC ROOT REPLACEMENT (N/A) TRANSESOPHAGEAL ECHOCARDIOGRAM (TEE) (N/A) as a surgical intervention .  The patient's history has been reviewed, patient examined, no change in status, stable for surgery.  I have reviewed the patient's chart and labs.  Questions were answered to the patient's satisfaction.     Purcell Nailslarence H Owen

## 2017-01-01 NOTE — Anesthesia Procedure Notes (Signed)
Procedure Name: Intubation Date/Time: 01/01/2017 3:15 PM Performed by: Little IshikawaMERCER, Fern Canova L Pre-anesthesia Checklist: Patient identified, Emergency Drugs available, Suction available and Patient being monitored Patient Re-evaluated:Patient Re-evaluated prior to inductionOxygen Delivery Method: Circle System Utilized Preoxygenation: Pre-oxygenation with 100% oxygen Laryngoscope Size: Glidescope and 3 Grade View: Grade I Tube type: Oral Number of attempts: 1 Airway Equipment and Method: Oral airway and Bougie stylet (cook catheter used to exchange DLT with ETT) Placement Confirmation: ETT inserted through vocal cords under direct vision,  positive ETCO2 and breath sounds checked- equal and bilateral Secured at: 20 cm Tube secured with: Tape Dental Injury: Teeth and Oropharynx as per pre-operative assessment

## 2017-01-01 NOTE — Anesthesia Procedure Notes (Signed)
Central Venous Catheter Insertion Performed by: Heather RobertsSINGER, Jamekia Gannett, anesthesiologist Start/End6/01/2017 6:54 AM, 01/01/2017 7:12 AM Patient location: Pre-op. Preanesthetic checklist: patient identified, IV checked, site marked, risks and benefits discussed, surgical consent, monitors and equipment checked, pre-op evaluation, timeout performed and anesthesia consent Position: Trendelenburg Lidocaine 1% used for infiltration and patient sedated Hand hygiene performed , maximum sterile barriers used  and Seldinger technique used Catheter size: 8.5 Fr Total catheter length 8. Central line was placed.Sheath introducer Procedure performed using ultrasound guided technique. Ultrasound Notes:anatomy identified, needle tip was noted to be adjacent to the nerve/plexus identified, no ultrasound evidence of intravascular and/or intraneural injection and image(s) printed for medical record Attempts: 1 Following insertion, line sutured, dressing applied and Biopatch. Post procedure assessment: blood return through all ports, free fluid flow and no air  Patient tolerated the procedure well with no immediate complications. Additional procedure comments: Unable to pass wire or PAC past 20 cm.  PAC aborted and introducer left in place for access..Marland Kitchen

## 2017-01-01 NOTE — Progress Notes (Signed)
Rapid wean protocol initiated by RT and this RN. Will continue to closely monitor pt.  Herma ArdMOSELEY, [place] Delauder F, RN

## 2017-01-01 NOTE — Anesthesia Procedure Notes (Addendum)
Procedure Name: Intubation Date/Time: 01/01/2017 8:01 AM Performed by: Rejeana Brock L Pre-anesthesia Checklist: Patient identified, Emergency Drugs available, Suction available, Patient being monitored and Timeout performed Patient Re-evaluated:Patient Re-evaluated prior to inductionOxygen Delivery Method: Circle system utilized Preoxygenation: Pre-oxygenation with 100% oxygen Intubation Type: IV induction Ventilation: Mask ventilation without difficulty Laryngoscope Size: Mac and 3 Grade View: Grade I Endobronchial tube: Double lumen EBT, Left, EBT position confirmed by fiberoptic bronchoscope and EBT position confirmed by auscultation and 37 Fr Number of attempts: 1 Airway Equipment and Method: Stylet Placement Confirmation: ETT inserted through vocal cords under direct vision,  positive ETCO2,  CO2 detector and breath sounds checked- equal and bilateral Secured at: 28 cm Tube secured with: Tape Dental Injury: Teeth and Oropharynx as per pre-operative assessment

## 2017-01-02 ENCOUNTER — Encounter (HOSPITAL_COMMUNITY): Payer: Self-pay | Admitting: Thoracic Surgery (Cardiothoracic Vascular Surgery)

## 2017-01-02 ENCOUNTER — Inpatient Hospital Stay (HOSPITAL_COMMUNITY): Payer: Medicare Other

## 2017-01-02 DIAGNOSIS — Z954 Presence of other heart-valve replacement: Secondary | ICD-10-CM

## 2017-01-02 LAB — CBC
HCT: 38 % (ref 36.0–46.0)
HCT: 38.2 % (ref 36.0–46.0)
Hemoglobin: 12.2 g/dL (ref 12.0–15.0)
Hemoglobin: 12.2 g/dL (ref 12.0–15.0)
MCH: 30.8 pg (ref 26.0–34.0)
MCH: 31.2 pg (ref 26.0–34.0)
MCHC: 32.1 g/dL (ref 30.0–36.0)
MCHC: 31.9 g/dL (ref 30.0–36.0)
MCV: 96 fL (ref 78.0–100.0)
MCV: 97.7 fL (ref 78.0–100.0)
Platelets: 155 10*3/uL (ref 150–400)
Platelets: 129 K/uL — ABNORMAL LOW (ref 150–400)
RBC: 3.96 MIL/uL (ref 3.87–5.11)
RBC: 3.91 MIL/uL (ref 3.87–5.11)
RDW: 13.6 % (ref 11.5–15.5)
RDW: 14.3 % (ref 11.5–15.5)
WBC: 15.3 10*3/uL — ABNORMAL HIGH (ref 4.0–10.5)
WBC: 12.4 K/uL — ABNORMAL HIGH (ref 4.0–10.5)

## 2017-01-02 LAB — CREATININE, SERUM
Creatinine, Ser: 0.84 mg/dL (ref 0.44–1.00)
GFR calc Af Amer: >60 mL/min (ref >60)
GFR calc non Af Amer: >60 mL/min (ref >60)

## 2017-01-02 LAB — GLUCOSE, CAPILLARY
GLUCOSE-CAPILLARY: 102 mg/dL — AB (ref 65–99)
GLUCOSE-CAPILLARY: 112 mg/dL — AB (ref 65–99)
GLUCOSE-CAPILLARY: 116 mg/dL — AB (ref 65–99)
GLUCOSE-CAPILLARY: 123 mg/dL — AB (ref 65–99)
GLUCOSE-CAPILLARY: 142 mg/dL — AB (ref 65–99)
GLUCOSE-CAPILLARY: 143 mg/dL — AB (ref 65–99)
Glucose-Capillary: 167 mg/dL — ABNORMAL HIGH (ref 65–99)
Glucose-Capillary: 166 mg/dL — ABNORMAL HIGH (ref 65–99)
Glucose-Capillary: 106 mg/dL — ABNORMAL HIGH (ref 65–99)
Glucose-Capillary: 114 mg/dL — ABNORMAL HIGH (ref 65–99)
Glucose-Capillary: 105 mg/dL — ABNORMAL HIGH (ref 65–99)
Glucose-Capillary: 93 mg/dL (ref 65–99)
Glucose-Capillary: 107 mg/dL — ABNORMAL HIGH (ref 65–99)
Glucose-Capillary: 102 mg/dL — ABNORMAL HIGH (ref 65–99)
Glucose-Capillary: 104 mg/dL — ABNORMAL HIGH (ref 65–99)
Glucose-Capillary: 137 mg/dL — ABNORMAL HIGH (ref 65–99)
Glucose-Capillary: 141 mg/dL — ABNORMAL HIGH (ref 65–99)
Glucose-Capillary: 128 mg/dL — ABNORMAL HIGH (ref 65–99)

## 2017-01-02 LAB — BASIC METABOLIC PANEL
Anion gap: 6 (ref 5–15)
BUN: 11 mg/dL (ref 6–20)
CALCIUM: 7.9 mg/dL — AB (ref 8.9–10.3)
CO2: 24 mmol/L (ref 22–32)
CREATININE: 0.63 mg/dL (ref 0.44–1.00)
Chloride: 112 mmol/L — ABNORMAL HIGH (ref 101–111)
GFR calc non Af Amer: >60 mL/min (ref >60)
Glucose, Bld: 112 mg/dL — ABNORMAL HIGH (ref 65–99)
Potassium: 3.7 mmol/L (ref 3.5–5.1)
Sodium: 142 mmol/L (ref 135–145)

## 2017-01-02 LAB — POCT I-STAT 3, ART BLOOD GAS (G3+)
Acid-base deficit: 3 mmol/L — ABNORMAL HIGH (ref 0.0–2.0)
Acid-base deficit: 5 mmol/L — ABNORMAL HIGH (ref 0.0–2.0)
BICARBONATE: 22 mmol/L (ref 20.0–28.0)
Bicarbonate: 22.6 mmol/L (ref 20.0–28.0)
O2 Saturation: 90 %
O2 Saturation: 90 %
PH ART: 7.292 — AB (ref 7.350–7.450)
Patient temperature: 36.8
TCO2: 23 mmol/L (ref 0–100)
TCO2: 24 mmol/L (ref 0–100)
pCO2 arterial: 40.7 mmHg (ref 32.0–48.0)
pCO2 arterial: 45.3 mmHg (ref 32.0–48.0)
pH, Arterial: 7.352 (ref 7.350–7.450)
pO2, Arterial: 61 mmHg — ABNORMAL LOW (ref 83.0–108.0)
pO2, Arterial: 65 mmHg — ABNORMAL LOW (ref 83.0–108.0)

## 2017-01-02 LAB — POCT I-STAT, CHEM 8
BUN: 14 mg/dL (ref 6–20)
Calcium, Ion: 1.23 mmol/L (ref 1.15–1.40)
Chloride: 108 mmol/L (ref 101–111)
Creatinine, Ser: 0.8 mg/dL (ref 0.44–1.00)
Glucose, Bld: 133 mg/dL — ABNORMAL HIGH (ref 65–99)
HEMATOCRIT: 37 % (ref 36.0–46.0)
Hemoglobin: 12.6 g/dL (ref 12.0–15.0)
POTASSIUM: 4.4 mmol/L (ref 3.5–5.1)
SODIUM: 144 mmol/L (ref 135–145)
TCO2: 25 mmol/L (ref 0–100)

## 2017-01-02 LAB — MAGNESIUM
Magnesium: 2.7 mg/dL — ABNORMAL HIGH (ref 1.7–2.4)
Magnesium: 2.5 mg/dL — ABNORMAL HIGH (ref 1.7–2.4)

## 2017-01-02 MED ORDER — FUROSEMIDE 10 MG/ML IJ SOLN
20.0000 mg | Freq: Four times a day (QID) | INTRAMUSCULAR | Status: DC
  Administered 2017-01-02 (×2): 20 mg via INTRAVENOUS
  Filled 2017-01-02 (×2): qty 2

## 2017-01-02 MED ORDER — ENOXAPARIN SODIUM 30 MG/0.3ML ~~LOC~~ SOLN
30.0000 mg | Freq: Every day | SUBCUTANEOUS | Status: DC
  Administered 2017-01-03 – 2017-01-04 (×2): 30 mg via SUBCUTANEOUS
  Filled 2017-01-02 (×2): qty 0.3

## 2017-01-02 MED ORDER — INSULIN DETEMIR 100 UNIT/ML ~~LOC~~ SOLN
20.0000 [IU] | Freq: Two times a day (BID) | SUBCUTANEOUS | Status: DC
Start: 2017-01-02 — End: 2017-01-05
  Administered 2017-01-02 – 2017-01-03 (×3): 20 [IU] via SUBCUTANEOUS
  Filled 2017-01-02 (×6): qty 0.2

## 2017-01-02 MED ORDER — INSULIN DETEMIR 100 UNIT/ML ~~LOC~~ SOLN
20.0000 [IU] | Freq: Once | SUBCUTANEOUS | Status: AC
  Administered 2017-01-02: 20 [IU] via SUBCUTANEOUS
  Filled 2017-01-02: qty 0.2

## 2017-01-02 MED ORDER — PHENYLEPHRINE HCL 10 MG/ML IJ SOLN
0.0000 ug/min | INTRAMUSCULAR | Status: DC
  Administered 2017-01-02: 90 ug/min via INTRAVENOUS
  Filled 2017-01-02: qty 4

## 2017-01-02 MED ORDER — FUROSEMIDE 10 MG/ML IJ SOLN
40.0000 mg | Freq: Four times a day (QID) | INTRAMUSCULAR | Status: AC
  Administered 2017-01-02: 40 mg via INTRAVENOUS
  Filled 2017-01-02: qty 4

## 2017-01-02 MED ORDER — POTASSIUM CHLORIDE 10 MEQ/50ML IV SOLN
10.0000 meq | INTRAVENOUS | Status: AC
Start: 2017-01-02 — End: 2017-01-02
  Administered 2017-01-02 (×3): 10 meq via INTRAVENOUS
  Filled 2017-01-02 (×3): qty 50

## 2017-01-02 MED ORDER — INSULIN ASPART 100 UNIT/ML ~~LOC~~ SOLN
0.0000 [IU] | SUBCUTANEOUS | Status: DC
Start: 2017-01-02 — End: 2017-01-04
  Administered 2017-01-02 – 2017-01-03 (×6): 2 [IU] via SUBCUTANEOUS

## 2017-01-02 NOTE — Progress Notes (Signed)
[address]       [place] [address]             [phone]        CARDIOTHORACIC SURGERY PROGRESS NOTE   R1 Day Post-Op Procedure(s) (LRB): MINIMALLY INVASIVE AORTIC VALVE REPLACEMENT (AVR) (N/A) ASCENDING AORTIC ROOT REPLACEMENT (N/A) TRANSESOPHAGEAL ECHOCARDIOGRAM (TEE) (N/A)  Subjective: Looks good.  Mild soreness in chest.  Overall feels remarkably good.  Objective: Vital signs: BP Readings from Last 1 Encounters:  01/02/17 115/61   Pulse Readings from Last 1 Encounters:  01/02/17 92   Resp Readings from Last 1 Encounters:  01/02/17 (!) 24   Temp Readings from Last 1 Encounters:  01/02/17 98.6 F (37 C)    Hemodynamics: PAP: (24-42)/(9-23) 30/13 CO:  [3.3 L/min-5.7 L/min] 5.6 L/min CI:  [1.5 L/min/m2-2.5 L/min/m2] 2.5 L/min/m2  Physical Exam:  Rhythm:   sinus  Breath sounds: clear  Heart sounds:  RRR  Incisions:  Dressings dry, intact  Abdomen:  Soft, non-distended, non-tender  Extremities:  Warm, well-perfused  Chest tubes:  decreasing volume thin serosanguinous output, no air leak    Intake/Output from previous day: 06/07 0701 - 06/08 0700 In: 8619.8 [I.V.:5579.8; Blood:890; IV Piggyback:2150] Out: 4560 [Urine:2570; Emesis/NG output:5; Blood:1575; Chest Tube:410] Intake/Output this shift: No intake/output data recorded.  Lab Results:  CBC: Recent Labs  01/01/17 2128 01/01/17 2140 01/02/17 0306  WBC 14.3*  --  15.3*  HGB 12.3 12.6 12.2  HCT 38.2 37.0 38.0  PLT 146*  --  155    BMET:  Recent Labs  12/30/16 1010  01/01/17 2140 01/02/17 0306  NA 139  < > 145 142  K 4.0  < > 3.6 3.7  CL 107  < > 109 112*  CO2 23  --   --  24  GLUCOSE 103*  < > 157* 112*  BUN 14  < > 14 11  CREATININE 0.82  < > 0.60 0.63  CALCIUM 9.4  --   --  7.9*  < > = values in this interval not displayed.   PT/INR:   Recent Labs  01/01/17 1533  LABPROT 18.6*  INR 1.53    CBG (last 3)   Recent Labs  01/02/17 0602  01/02/17 0659 01/02/17 0804  GLUCAP 93 107* 102*    ABG    Component Value Date/Time   PHART 7.352 01/02/2017 0018   PCO2ART 40.7 01/02/2017 0018   PO2ART 61.0 (L) 01/02/2017 0018   HCO3 22.6 01/02/2017 0018   TCO2 24 01/02/2017 0018   ACIDBASEDEF 3.0 (H) 01/02/2017 0018   O2SAT 90.0 01/02/2017 0018    CXR: PORTABLE CHEST 1 VIEW  COMPARISON:  01/01/2017  FINDINGS: Endotracheal and NG tubes removed. Right chest tube is stable. Right jugular Swan-Ganz catheter is stable. Left jugular introducer is stable. Vascular congestion and diffuse predominately central basilar airspace disease is worse. Heart is enlarged. No pneumothorax.  IMPRESSION: Extubated. The above findings are worrisome for increasing pulmonary edema.   Electronically Signed   By: Jolaine Click M.D.   On: 01/02/2017 07:29   EKG: NSR w/out acute ischemic changes, RBBB (old)   Assessment/Plan: S/P Procedure(s) (LRB): MINIMALLY INVASIVE AORTIC VALVE REPLACEMENT (AVR) (N/A) ASCENDING AORTIC ROOT REPLACEMENT (N/A) TRANSESOPHAGEAL ECHOCARDIOGRAM (TEE) (N/A)  Overall doing remarkably well POD1 Maintaining NSR w/ stable hemodynamics on low dose milrinone, Neo and dopamine drips Longstanding hypertension w/ significant diastolic dysfunction, normal LV systolic function Breathing comfortably w/ O2 sats 94% on 4  L/min Expected post op acute blood loss anemia, very mild Expected post op atelectasis, mild Acute on chronic diastolic CHF with expected post-op volume excess, weight reportedly 18 lbs > preop Morbid obesity 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

## 2017-01-02 NOTE — Care Management Note (Signed)
Case Management Note Donn PieriniKristi Randel Hargens RN, BSN Unit 2W-Case Manager-- 2H coverage [phone]  Patient Details  Name: Archie PattenJudith M Severa MRN: [id number] Date of Birth: 04/14/1956  Subjective/Objective:  Pt admitted s/p mini AVR on 01/01/17                  Action/Plan: PTA pt lived at home with family - was independent- has both RW and cane at home if needed. PCP- Dr Azucena CecilW Mackenzie, [facility]-- anticipate return home- CM to follow  Expected Discharge Date:                  Expected Discharge Plan:  Home/Self Care  In-House Referral:     Discharge planning Services  CM Consult  Post Acute Care Choice:    Choice offered to:     DME Arranged:    DME Agency:     HH Arranged:    HH Agency:     Status of Service:  In process, will continue to follow  If discussed at Long Length of Stay Meetings, dates discussed:    Discharge Disposition:   Additional Comments:  Darrold SpanWebster, Jazzalynn Rhudy Hall, RN 01/02/2017, 9:58 AM

## 2017-01-02 NOTE — Progress Notes (Signed)
Patient ID: Christine PattenJudith M Hicks, female   DOB: 05/26/1956, 61 y.o.   MRN: [id number]  SICU Evening Rounds  Hemodynamically stable in sinus rhythm  Has been up out of bed today  Urine output ok but not diuresing much on lasix 20 mg. Will give her 40 mg this evening.  CT output 300 cc today  BMET    Component Value Date/Time   NA 144 01/02/2017 1610   NA 139 12/26/2016 1116   K 4.4 01/02/2017 1610   CL 108 01/02/2017 1610   CO2 24 01/02/2017 0306   GLUCOSE 133 (H) 01/02/2017 1610   BUN 14 01/02/2017 1610   BUN 10 12/26/2016 1116   CREATININE 0.80 01/02/2017 1610   CREATININE 0.93 12/04/2015 1000   CALCIUM 7.9 (L) 01/02/2017 0306   GFRNONAA >60 01/02/2017 1558   GFRAA >60 01/02/2017 1558   CBC    Component Value Date/Time   WBC 12.4 (H) 01/02/2017 1558   RBC 3.91 01/02/2017 1558   HGB 12.6 01/02/2017 1610   HCT 37.0 01/02/2017 1610   PLT 129 (L) 01/02/2017 1558   MCV 97.7 01/02/2017 1558   MCH 31.2 01/02/2017 1558   MCHC 31.9 01/02/2017 1558   RDW 14.3 01/02/2017 1558   LYMPHSABS 2.8 12/11/2016 1434   MONOABS 0.4 12/11/2016 1434   EOSABS 0.3 12/11/2016 1434   BASOSABS 0.0 12/11/2016 1434   A/P: doing well POD 1. Continue diuresis, mobilization.

## 2017-01-02 NOTE — Progress Notes (Signed)
CARDIOLOGY PROGRESS NOTE  Nice surgical result.  Patient is awake and alert. No arrhythmias. Auscultation reveals no significant murmur although heart sounds or distant. Neurologically intact  Postsurgical EKG performed this a.m. reveals sinus rhythm with right bundle, unchanged from preoperative status but with slightly less QRS widening. No ischemic changes.  Overall great result. No active cardiac issues that require cardiology at this time.  We'll follow her progress.

## 2017-01-02 NOTE — Plan of Care (Signed)
Problem: Activity: Goal: Risk for activity intolerance will decrease Outcome: Progressing 2x to chair  Problem: Fluid Volume: Goal: Ability to maintain a balanced intake and output will improve Outcome: Progressing On lasix; diuresing   Problem: Bowel/Gastric: Goal: Gastrointestinal status for postoperative course will improve Outcome: Progressing Good appetite; no nausea; active bowel sounds; passing gas  Problem: Cardiac: Goal: Hemodynamic stability will improve Outcome: Progressing Off pressors; no drips

## 2017-01-03 ENCOUNTER — Inpatient Hospital Stay (HOSPITAL_COMMUNITY): Payer: Medicare Other

## 2017-01-03 LAB — CBC
HCT: 38 % (ref 36.0–46.0)
Hemoglobin: 12.1 g/dL (ref 12.0–15.0)
MCH: 31.8 pg (ref 26.0–34.0)
MCHC: 31.8 g/dL (ref 30.0–36.0)
MCV: 99.7 fL (ref 78.0–100.0)
Platelets: 137 10*3/uL — ABNORMAL LOW (ref 150–400)
RBC: 3.81 MIL/uL — ABNORMAL LOW (ref 3.87–5.11)
RDW: 14.9 % (ref 11.5–15.5)
WBC: 12.4 10*3/uL — AB (ref 4.0–10.5)

## 2017-01-03 LAB — GLUCOSE, CAPILLARY
GLUCOSE-CAPILLARY: 125 mg/dL — AB (ref 65–99)
GLUCOSE-CAPILLARY: 109 mg/dL — AB (ref 65–99)
GLUCOSE-CAPILLARY: 136 mg/dL — AB (ref 65–99)
Glucose-Capillary: 135 mg/dL — ABNORMAL HIGH (ref 65–99)
Glucose-Capillary: 112 mg/dL — ABNORMAL HIGH (ref 65–99)
Glucose-Capillary: 141 mg/dL — ABNORMAL HIGH (ref 65–99)

## 2017-01-03 LAB — BASIC METABOLIC PANEL
ANION GAP: 6 (ref 5–15)
BUN: 15 mg/dL (ref 6–20)
CALCIUM: 8.5 mg/dL — AB (ref 8.9–10.3)
CO2: 24 mmol/L (ref 22–32)
Chloride: 111 mmol/L (ref 101–111)
Creatinine, Ser: 0.86 mg/dL (ref 0.44–1.00)
GFR calc Af Amer: >60 mL/min (ref >60)
GFR calc non Af Amer: >60 mL/min (ref >60)
GLUCOSE: 123 mg/dL — AB (ref 65–99)
POTASSIUM: 4 mmol/L (ref 3.5–5.1)
SODIUM: 141 mmol/L (ref 135–145)

## 2017-01-03 MED ORDER — CHLORHEXIDINE GLUCONATE CLOTH 2 % EX PADS
6.0000 | MEDICATED_PAD | Freq: Every day | CUTANEOUS | Status: AC
  Administered 2017-01-04 – 2017-01-05 (×2): 6 via TOPICAL

## 2017-01-03 MED ORDER — MUPIROCIN 2 % EX OINT
1.0000 "application " | TOPICAL_OINTMENT | Freq: Two times a day (BID) | CUTANEOUS | Status: AC
  Administered 2017-01-03 – 2017-01-06 (×5): 1 via NASAL

## 2017-01-03 MED ORDER — FUROSEMIDE 10 MG/ML IJ SOLN
40.0000 mg | Freq: Two times a day (BID) | INTRAMUSCULAR | Status: AC
  Administered 2017-01-03 (×2): 40 mg via INTRAVENOUS
  Filled 2017-01-03 (×2): qty 4

## 2017-01-03 MED ORDER — GUAIFENESIN ER 600 MG PO TB12
600.0000 mg | ORAL_TABLET | Freq: Two times a day (BID) | ORAL | Status: DC
  Administered 2017-01-03 – 2017-01-10 (×15): 600 mg via ORAL
  Filled 2017-01-03 (×15): qty 1

## 2017-01-03 MED ORDER — POTASSIUM CHLORIDE CRYS ER 20 MEQ PO TBCR
40.0000 meq | EXTENDED_RELEASE_TABLET | Freq: Two times a day (BID) | ORAL | Status: AC
  Administered 2017-01-03 (×2): 40 meq via ORAL
  Filled 2017-01-03 (×2): qty 2

## 2017-01-03 MED ORDER — METOLAZONE 5 MG PO TABS
5.0000 mg | ORAL_TABLET | Freq: Once | ORAL | Status: AC
  Administered 2017-01-03: 5 mg via ORAL
  Filled 2017-01-03: qty 1

## 2017-01-03 NOTE — Plan of Care (Signed)
Problem: Activity: Goal: Risk for activity intolerance will decrease Outcome: Progressing Ambulate 2x  Problem: Fluid Volume: Goal: Ability to maintain a balanced intake and output will improve Outcome: Progressing Responding to lasix; diuresing  Problem: Bowel/Gastric: Goal: Will not experience complications related to bowel motility Outcome: Progressing Patient consumed card/carb diet; no nausea  Problem: Pain Management: Goal: Pain level will decrease Outcome: Progressing Patient states pain is better controlled and able to rest better

## 2017-01-03 NOTE — Progress Notes (Signed)
Patient ID: [name], female   DOB: 02/05/1956, 61 y.o.   MRN: [id number]  SICU Evening Rounds:  Hemodynamically stable in sinus rhythm  Diuresed well today -1500 cc  Continue IS, mobilization

## 2017-01-03 NOTE — Progress Notes (Signed)
Progress Note  Patient Name: Christine PattenJudith M Hicks Date of Encounter: 01/03/2017  Primary Cardiologist: Dr. Verdis PrimeHenry Smith  Subjective   She complains of chest soreness but denies shortness of breath  Inpatient Medications    Scheduled Meds: . acetaminophen  1,000 mg Oral Q6H  . aspirin  324 mg Per Tube Daily  . bisacodyl  10 mg Oral Daily   Or  . bisacodyl  10 mg Rectal Daily  . docusate sodium  200 mg Oral Daily  . enoxaparin (LOVENOX) injection  30 mg Subcutaneous QHS  . insulin aspart  0-24 Units Subcutaneous Q4H  . insulin detemir  20 Units Subcutaneous BID  . levothyroxine  150 mcg Oral QAC breakfast  . mouth rinse  15 mL Mouth Rinse BID  . pantoprazole  40 mg Oral Daily  . sodium chloride flush  3 mL Intravenous Q12H   Continuous Infusions: . sodium chloride 250 mL (01/02/17 0700)  . DOPamine Stopped (01/02/17 1100)  . insulin (NOVOLIN-R) infusion Stopped (01/02/17 1100)  . lactated ringers    . lactated ringers 20 mL/hr at 01/03/17 0600  . milrinone Stopped (01/02/17 0902)  . nitroGLYCERIN    . phenylephrine (NEO-SYNEPHRINE) Adult infusion Stopped (01/02/17 0902)   PRN Meds: metoprolol tartrate, morphine injection, ondansetron (ZOFRAN) IV, oxyCODONE, sodium chloride flush, traMADol   Vital Signs    Vitals:   01/03/17 0600 01/03/17 0640 01/03/17 0700 01/03/17 0740  BP:  124/62 119/78   Pulse:  84 83   Resp: (!) 25 14 19    Temp:    97.8 F (36.6 C)  TempSrc:    Oral  SpO2:  93% 95%   Weight: 298 lb 11.6 oz (135.5 kg)     Height:        Intake/Output Summary (Last 24 hours) at 01/03/17 0811 Last data filed at 01/03/17 0647  Gross per 24 hour  Intake          1187.32 ml  Output             1915 ml  Net          -727.68 ml   Filed Weights   01/01/17 0615 01/02/17 0600 01/03/17 0600  Weight: 275 lb (124.7 kg) 298 lb 15.1 oz (135.6 kg) 298 lb 11.6 oz (135.5 kg)    Telemetry    Normal sinus rhythm with right bundle branch block - Personally  Reviewed  ECG    Normal sinus rhythm with right bundle branch block - Personally Reviewed  Physical Exam   GEN: No acute distress.   Neck: 7 cm JVD Cardiac: RRR, no murmurs, rubs, or gallops. Split S2 sternotomy incision clean and dry Respiratory: Clear to auscultation bilaterally except for minimal basilar rales GI: Soft, nontender, non-distended  MS: No edema; No deformity. Neuro:  Nonfocal  Psych: Normal affect   Labs    Chemistry Recent Labs Lab 12/30/16 1010  01/02/17 0306 01/02/17 1558 01/02/17 1610 01/03/17 0400  NA 139  < > 142  --  144 141  K 4.0  < > 3.7  --  4.4 4.0  CL 107  < > 112*  --  108 111  CO2 23  --  24  --   --  24  GLUCOSE 103*  < > 112*  --  133* 123*  BUN 14  < > 11  --  14 15  CREATININE 0.82  < > 0.63 0.84 0.80 0.86  CALCIUM 9.4  --  7.9*  --   --  8.5*  PROT 7.4  --   --   --   --   --   ALBUMIN 3.8  --   --   --   --   --   AST 31  --   --   --   --   --   ALT 23  --   --   --   --   --   ALKPHOS 74  --   --   --   --   --   BILITOT 0.8  --   --   --   --   --   GFRNONAA >60  < > >60 >60  --  >60  GFRAA >60  < > >60 >60  --  >60  ANIONGAP 9  --  6  --   --  6  < > = values in this interval not displayed.   Hematology Recent Labs Lab 01/02/17 0306 01/02/17 1558 01/02/17 1610 01/03/17 0400  WBC 15.3* 12.4*  --  12.4*  RBC 3.96 3.91  --  3.81*  HGB 12.2 12.2 12.6 12.1  HCT 38.0 38.2 37.0 38.0  MCV 96.0 97.7  --  99.7  MCH 30.8 31.2  --  31.8  MCHC 32.1 31.9  --  31.8  RDW 13.6 14.3  --  14.9  PLT 155 129*  --  137*    Cardiac EnzymesNo results for input(s): TROPONINI in the last 168 hours. No results for input(s): TROPIPOC in the last 168 hours.   BNPNo results for input(s): BNP, PROBNP in the last 168 hours.   DDimer No results for input(s): DDIMER in the last 168 hours.   Radiology    Dg Chest Port 1 View  Result Date: 01/03/2017 CLINICAL DATA:  Aortic valve replacement. Pleural effusions and atelectasis EXAM:  PORTABLE CHEST 1 VIEW COMPARISON:  January 02, 2017 FINDINGS: Swan-Ganz catheter is been removed. Cordis tip is in the superior vena cava. Right chest tube and mediastinal drain remain in unchanged in positions. No pneumothorax evident. There is cardiomegaly with pulmonary venous hypertension. There are bilateral pleural effusions with lower lobe atelectatic change bilaterally, stable. Aortic valve replacement present. There is aortic atherosclerosis. No bone lesions. No evident adenopathy. IMPRESSION: No pneumothorax. Tube and catheter positions as described. Evidence of congestive heart failure, stable. Bilateral lower lobe atelectatic change appears stable. The cardiac silhouette is stable. There is aortic atherosclerosis. Electronically Signed   By: Bretta Bang III M.D.   On: 01/03/2017 07:37   Dg Chest Port 1 View  Result Date: 01/02/2017 CLINICAL DATA:  Chest tube and cardiac surgery EXAM: PORTABLE CHEST 1 VIEW COMPARISON:  01/01/2017 FINDINGS: Endotracheal and NG tubes removed. Right chest tube is stable. Right jugular Swan-Ganz catheter is stable. Left jugular introducer is stable. Vascular congestion and diffuse predominately central basilar airspace disease is worse. Heart is enlarged. No pneumothorax. IMPRESSION: Extubated. The above findings are worrisome for increasing pulmonary edema. Electronically Signed   By: Jolaine Click M.D.   On: 01/02/2017 07:29   Dg Chest Port 1 View  Result Date: 01/01/2017 CLINICAL DATA:  Aortic valve and root replacement. EXAM: PORTABLE CHEST 1 VIEW COMPARISON:  Two days ago FINDINGS: Cardiopericardial enlargement accentuated by low volumes and supine positioning. Interval median sternotomy for above-described procedures. There is a Swan-Ganz catheter from the right with tip overlapping the main pulmonary artery. Endotracheal tube with tip at the clavicular heads. An orogastric tube reaches the stomach at least. Bilateral IJ  sheaths. Thoracic drains. Low volume chest  with hazy basilar density consistent with atelectasis. No visible pneumothorax. Pulmonary vascular congestion without Kerley lines. IMPRESSION: 1. Tubes and lines as described above. 2. Low volume chest with atelectasis and congested appearance of the vessels. Electronically Signed   By: Marnee Spring M.D.   On: 01/01/2017 16:15    Cardiac Studies   None  Patient Profile     61 y.o. female with severe aortic stenosis, congenital bicuspid aortic valve, status post aortic valve replacement  Assessment & Plan    1. Aortic stenosis - she is status post aortic valve replacement, postop day 2. Doing well. 2. Hypertensive heart disease - her blood pressure is currently well controlled no change in medications 3. Right bundle branch block - she has had no worsening of her conduction system after aortic valve replacement. Hopefully this trend will continue. She will be monitored on telemetry.  Signed, Lewayne Bunting, MD  01/03/2017, 8:11 AM  Patient ID: Christine Hicks, female   DOB: Aug 13, 1955, 61 y.o.   MRN: [id number]

## 2017-01-03 NOTE — Progress Notes (Signed)
2 Days Post-Op Procedure(s) (LRB): MINIMALLY INVASIVE AORTIC VALVE REPLACEMENT (AVR) (N/A) ASCENDING AORTIC ROOT REPLACEMENT (N/A) TRANSESOPHAGEAL ECHOCARDIOGRAM (TEE) (N/A) Subjective:  No complaints. Has not ambulated yet today.  Objective: Vital signs in last 24 hours: Temp:  [97.5 F (36.4 C)-98.1 F (36.7 C)] 97.8 F (36.6 C) (06/09 0740) Pulse Rate:  [78-96] 83 (06/09 0700) Cardiac Rhythm: Normal sinus rhythm (06/09 0400) Resp:  [14-30] 19 (06/09 0700) BP: (103-138)/(62-81) 119/78 (06/09 0700) SpO2:  [92 %-97 %] 95 % (06/09 0700) Arterial Line BP: (127-148)/(63-73) 127/63 (06/08 1000) Weight:  [135.5 kg (298 lb 11.6 oz)] 135.5 kg (298 lb 11.6 oz) (06/09 0600)  Hemodynamic parameters for last 24 hours:    Intake/Output from previous day: 06/08 0701 - 06/09 0700 In: 1667.3 [P.O.:960; I.V.:557.3; IV Piggyback:150] Out: 1610 [RUEAV:[id number] [Urine:1475; Chest Tube:440] Intake/Output this shift: No intake/output data recorded.  General appearance: alert and cooperative Neurologic: intact Heart: regular rate and rhythm, S1, S2 normal, no murmur, click, rub or gallop Lungs: diminished breath sounds bibasilar Extremities: edema moderate anasarca Wound: dressings dry  Lab Results:  Recent Labs  01/02/17 1558 01/02/17 1610 01/03/17 0400  WBC 12.4*  --  12.4*  HGB 12.2 12.6 12.1  HCT 38.2 37.0 38.0  PLT 129*  --  137*   BMET:  Recent Labs  01/02/17 0306  01/02/17 1610 01/03/17 0400  NA 142  --  144 141  K 3.7  --  4.4 4.0  CL 112*  --  108 111  CO2 24  --   --  24  GLUCOSE 112*  --  133* 123*  BUN 11  --  14 15  CREATININE 0.63  < > 0.80 0.86  CALCIUM 7.9*  --   --  8.5*  < > = values in this interval not displayed.  PT/INR:  Recent Labs  01/01/17 1533  LABPROT 18.6*  INR 1.53   ABG    Component Value Date/Time   PHART 7.352 01/02/2017 0018   HCO3 22.6 01/02/2017 0018   TCO2 25 01/02/2017 1610   ACIDBASEDEF 3.0 (H) 01/02/2017 0018   O2SAT 90.0 01/02/2017  0018   CBG (last 3)   Recent Labs  01/03/17 0035 01/03/17 0411 01/03/17 0737  GLUCAP 135* 112* 125*   CLINICAL DATA:  Aortic valve replacement. Pleural effusions and atelectasis  EXAM: PORTABLE CHEST 1 VIEW  COMPARISON:  January 02, 2017  FINDINGS: Swan-Ganz catheter is been removed. Cordis tip is in the superior vena cava. Right chest tube and mediastinal drain remain in unchanged in positions. No pneumothorax evident.  There is cardiomegaly with pulmonary venous hypertension. There are bilateral pleural effusions with lower lobe atelectatic change bilaterally, stable. Aortic valve replacement present. There is aortic atherosclerosis. No bone lesions. No evident adenopathy.  IMPRESSION: No pneumothorax. Tube and catheter positions as described. Evidence of congestive heart failure, stable. Bilateral lower lobe atelectatic change appears stable. The cardiac silhouette is stable. There is aortic atherosclerosis.   Electronically Signed   By: Bretta BangWilliam  Woodruff III M.D.   On: 01/03/2017 07:37  Assessment/Plan: S/P Procedure(s) (LRB): MINIMALLY INVASIVE AORTIC VALVE REPLACEMENT (AVR) (N/A) ASCENDING AORTIC ROOT REPLACEMENT (N/A) TRANSESOPHAGEAL ECHOCARDIOGRAM (TEE) (N/A)  She is hemodynamically stable in sinus rhythm.  Volume excess: wt is 23 lbs over preop if accurate. Minimally changed from yesterday and did not diurese much yesterday. Will give lasix 40 IV bid today and add Metolazone.  Bilateral lower lobe atelectasis with morbid obesity. Continue IS and ambulation.  DC chest tubes  DM:  glucose under good control on current regimen.   LOS: 2 days    Alleen Borne 01/03/2017

## 2017-01-04 ENCOUNTER — Inpatient Hospital Stay (HOSPITAL_COMMUNITY): Payer: Medicare Other

## 2017-01-04 LAB — GLUCOSE, CAPILLARY
GLUCOSE-CAPILLARY: 119 mg/dL — AB (ref 65–99)
GLUCOSE-CAPILLARY: 76 mg/dL (ref 65–99)
GLUCOSE-CAPILLARY: 80 mg/dL (ref 65–99)
GLUCOSE-CAPILLARY: 98 mg/dL (ref 65–99)
GLUCOSE-CAPILLARY: 98 mg/dL (ref 65–99)

## 2017-01-04 LAB — BASIC METABOLIC PANEL
ANION GAP: 6 (ref 5–15)
BUN: 14 mg/dL (ref 6–20)
CO2: 30 mmol/L (ref 22–32)
Calcium: 8.7 mg/dL — ABNORMAL LOW (ref 8.9–10.3)
Chloride: 102 mmol/L (ref 101–111)
Creatinine, Ser: 0.76 mg/dL (ref 0.44–1.00)
GLUCOSE: 97 mg/dL (ref 65–99)
POTASSIUM: 3.5 mmol/L (ref 3.5–5.1)
Sodium: 138 mmol/L (ref 135–145)

## 2017-01-04 MED ORDER — INSULIN ASPART 100 UNIT/ML ~~LOC~~ SOLN: 0.0000 [IU] | Freq: Three times a day (TID) | SUBCUTANEOUS | Status: DC

## 2017-01-04 MED ORDER — METOLAZONE 5 MG PO TABS
5.0000 mg | ORAL_TABLET | Freq: Once | ORAL | Status: AC
  Administered 2017-01-04: 5 mg via ORAL
  Filled 2017-01-04: qty 1

## 2017-01-04 MED ORDER — POTASSIUM CHLORIDE CRYS ER 20 MEQ PO TBCR
40.0000 meq | EXTENDED_RELEASE_TABLET | Freq: Two times a day (BID) | ORAL | Status: AC
  Administered 2017-01-04 (×2): 40 meq via ORAL
  Filled 2017-01-04 (×2): qty 2

## 2017-01-04 MED ORDER — POTASSIUM CHLORIDE CRYS ER 20 MEQ PO TBCR
20.0000 meq | EXTENDED_RELEASE_TABLET | ORAL | Status: DC | PRN
  Administered 2017-01-04: 20 meq via ORAL
  Filled 2017-01-04: qty 1

## 2017-01-04 MED ORDER — FUROSEMIDE 40 MG PO TABS
40.0000 mg | ORAL_TABLET | Freq: Every day | ORAL | Status: DC
  Administered 2017-01-04 – 2017-01-09 (×6): 40 mg via ORAL
  Filled 2017-01-04 (×7): qty 1

## 2017-01-04 NOTE — Progress Notes (Signed)
3 Days Post-Op Procedure(s) (LRB): MINIMALLY INVASIVE AORTIC VALVE REPLACEMENT (AVR) (N/A) ASCENDING AORTIC ROOT REPLACEMENT (N/A) TRANSESOPHAGEAL ECHOCARDIOGRAM (TEE) (N/A) Subjective:  No complaints. Ambulating well, eating well  Objective: Vital signs in last 24 hours: Temp:  [97.7 F (36.5 C)-98.6 F (37 C)] 98.5 F (36.9 C) (06/10 0803) Pulse Rate:  [81-97] 85 (06/10 0800) Cardiac Rhythm: Normal sinus rhythm (06/10 0800) Resp:  [14-32] 21 (06/10 0605) BP: (79-144)/(46-94) 105/54 (06/10 0800) SpO2:  [89 %-96 %] 93 % (06/10 0800) Weight:  [132.6 kg (292 lb 4.8 oz)] 132.6 kg (292 lb 4.8 oz) (06/10 0600)  Hemodynamic parameters for last 24 hours:    Intake/Output from previous day: 06/09 0701 - 06/10 0700 In: 860 [P.O.:720; I.V.:140] Out: 4395 [Urine:4375; Chest Tube:20] Intake/Output this shift: Total I/O In: -  Out: 45 [Urine:45]  General appearance: alert and cooperative Heart: regular rate and rhythm, S1, S2 normal, no murmur, click, rub or gallop Lungs: clear to auscultation bilaterally Extremities: edema moderate edema but improved with diuresis Wound: dressings dry  Lab Results:  Recent Labs  01/02/17 1558 01/02/17 1610 01/03/17 0400  WBC 12.4*  --  12.4*  HGB 12.2 12.6 12.1  HCT 38.2 37.0 38.0  PLT 129*  --  137*   BMET:  Recent Labs  01/03/17 0400 01/04/17 0500  NA 141 138  K 4.0 3.5  CL 111 102  CO2 24 30  GLUCOSE 123* 97  BUN 15 14  CREATININE 0.86 0.76  CALCIUM 8.5* 8.7*    PT/INR:  Recent Labs  01/01/17 1533  LABPROT 18.6*  INR 1.53   ABG    Component Value Date/Time   PHART 7.352 01/02/2017 0018   HCO3 22.6 01/02/2017 0018   TCO2 25 01/02/2017 1610   ACIDBASEDEF 3.0 (H) 01/02/2017 0018   O2SAT 90.0 01/02/2017 0018   CBG (last 3)   Recent Labs  01/03/17 2358 01/04/17 0327 01/04/17 0759  GLUCAP 119* 98 76    Assessment/Plan: S/P Procedure(s) (LRB): MINIMALLY INVASIVE AORTIC VALVE REPLACEMENT (AVR)  (N/A) ASCENDING AORTIC ROOT REPLACEMENT (N/A) TRANSESOPHAGEAL ECHOCARDIOGRAM (TEE) (N/A)  She is hemodynamically stable in sinus rhythm.  Diuresed well yesterday and weight down 7 lbs but still 17 lbs over preop. Continue lasix and metolazone.  IS, ambulation  Diabetes: glucose under good control.   LOS: 3 days    Alleen BorneBryan K Bartle 01/04/2017

## 2017-01-05 LAB — TYPE AND SCREEN
ABO/RH(D): O POS
Antibody Screen: NEGATIVE
DONOR AG TYPE: NEGATIVE
DONOR AG TYPE: NEGATIVE
Donor AG Type: NEGATIVE
Donor AG Type: NEGATIVE
UNIT DIVISION: 0
UNIT DIVISION: 0
Unit division: 0
Unit division: 0
Unit division: 0
Unit division: 0

## 2017-01-05 LAB — GLUCOSE, CAPILLARY
Glucose-Capillary: 91 mg/dL (ref 65–99)
Glucose-Capillary: 93 mg/dL (ref 65–99)

## 2017-01-05 LAB — BPAM RBC
BLOOD PRODUCT EXPIRATION DATE: 201806292359
BLOOD PRODUCT EXPIRATION DATE: 201807092359
Blood Product Expiration Date: 201807132359
Blood Product Expiration Date: 201807132359
Blood Product Expiration Date: 201807132359
Blood Product Expiration Date: 201807132359
UNIT TYPE AND RH: 5100
UNIT TYPE AND RH: 5100
UNIT TYPE AND RH: 5100
UNIT TYPE AND RH: 5100
UNIT TYPE AND RH: 5100
Unit Type and Rh: 5100

## 2017-01-05 MED ORDER — ONDANSETRON HCL 4 MG/2ML IJ SOLN: 4.0000 mg | Freq: Four times a day (QID) | INTRAMUSCULAR | Status: DC | PRN

## 2017-01-05 MED ORDER — ONDANSETRON HCL 4 MG PO TABS: 4.0000 mg | ORAL_TABLET | Freq: Four times a day (QID) | ORAL | Status: DC | PRN

## 2017-01-05 MED ORDER — POTASSIUM CHLORIDE CRYS ER 20 MEQ PO TBCR
40.0000 meq | EXTENDED_RELEASE_TABLET | Freq: Two times a day (BID) | ORAL | Status: AC
  Administered 2017-01-05 (×2): 40 meq via ORAL
  Filled 2017-01-05 (×2): qty 2

## 2017-01-05 MED ORDER — DOCUSATE SODIUM 100 MG PO CAPS
200.0000 mg | ORAL_CAPSULE | Freq: Every day | ORAL | Status: DC
  Administered 2017-01-06 – 2017-01-09 (×4): 200 mg via ORAL
  Filled 2017-01-05 (×4): qty 2

## 2017-01-05 MED ORDER — SODIUM CHLORIDE 0.9 % IV SOLN: 250.0000 mL | INTRAVENOUS | Status: DC | PRN

## 2017-01-05 MED ORDER — METOLAZONE 5 MG PO TABS
5.0000 mg | ORAL_TABLET | Freq: Every day | ORAL | Status: AC
  Administered 2017-01-05 – 2017-01-06 (×2): 5 mg via ORAL
  Filled 2017-01-05 (×2): qty 1

## 2017-01-05 MED ORDER — ENOXAPARIN SODIUM 40 MG/0.4ML ~~LOC~~ SOLN
40.0000 mg | Freq: Every day | SUBCUTANEOUS | Status: DC
  Administered 2017-01-05 – 2017-01-09 (×5): 40 mg via SUBCUTANEOUS
  Filled 2017-01-05 (×5): qty 0.4

## 2017-01-05 MED ORDER — BISACODYL 5 MG PO TBEC
10.0000 mg | DELAYED_RELEASE_TABLET | Freq: Every day | ORAL | Status: DC | PRN
  Administered 2017-01-07 – 2017-01-08 (×2): 10 mg via ORAL
  Filled 2017-01-05 (×2): qty 2

## 2017-01-05 MED ORDER — OXYCODONE HCL 5 MG PO TABS
5.0000 mg | ORAL_TABLET | ORAL | Status: DC | PRN
  Administered 2017-01-05 – 2017-01-09 (×17): 10 mg via ORAL
  Administered 2017-01-09: 5 mg via ORAL
  Administered 2017-01-09: 10 mg via ORAL
  Administered 2017-01-10: 5 mg via ORAL
  Administered 2017-01-10: 10 mg via ORAL
  Filled 2017-01-05 (×8): qty 2
  Filled 2017-01-05: qty 1
  Filled 2017-01-05 (×2): qty 2
  Filled 2017-01-05: qty 1
  Filled 2017-01-05 (×9): qty 2

## 2017-01-05 MED ORDER — TRAMADOL HCL 50 MG PO TABS: 50.0000 mg | ORAL_TABLET | ORAL | Status: DC | PRN

## 2017-01-05 MED ORDER — ACETAMINOPHEN 325 MG PO TABS: 650.0000 mg | ORAL_TABLET | Freq: Four times a day (QID) | ORAL | Status: DC | PRN

## 2017-01-05 MED ORDER — METOPROLOL TARTRATE 12.5 MG HALF TABLET
12.5000 mg | ORAL_TABLET | Freq: Two times a day (BID) | ORAL | Status: DC
  Administered 2017-01-05 – 2017-01-07 (×5): 12.5 mg via ORAL
  Filled 2017-01-05 (×5): qty 1

## 2017-01-05 MED ORDER — MOVING RIGHT ALONG BOOK
Freq: Once | Status: AC
  Administered 2017-01-05: 15:00:00
  Filled 2017-01-05: qty 1

## 2017-01-05 MED ORDER — SODIUM CHLORIDE 0.9% FLUSH
3.0000 mL | Freq: Two times a day (BID) | INTRAVENOUS | Status: DC
  Administered 2017-01-06 – 2017-01-09 (×7): 3 mL via INTRAVENOUS

## 2017-01-05 MED ORDER — BISACODYL 10 MG RE SUPP: 10.0000 mg | Freq: Every day | RECTAL | Status: DC | PRN

## 2017-01-05 MED ORDER — ASPIRIN EC 325 MG PO TBEC
325.0000 mg | DELAYED_RELEASE_TABLET | Freq: Every day | ORAL | Status: DC
  Administered 2017-01-05 – 2017-01-10 (×6): 325 mg via ORAL
  Filled 2017-01-05 (×6): qty 1

## 2017-01-05 MED ORDER — SODIUM CHLORIDE 0.9% FLUSH: 3.0000 mL | INTRAVENOUS | Status: DC | PRN

## 2017-01-05 MED FILL — Electrolyte-R (PH 7.4) Solution: INTRAVENOUS | Qty: 1000 | Status: AC

## 2017-01-05 MED FILL — Heparin Sodium (Porcine) Inj 1000 Unit/ML: INTRAMUSCULAR | Qty: 20 | Status: AC

## 2017-01-05 MED FILL — Sodium Chloride IV Soln 0.9%: INTRAVENOUS | Qty: 2000 | Status: AC

## 2017-01-05 MED FILL — Mannitol IV Soln 20%: INTRAVENOUS | Qty: 500 | Status: AC

## 2017-01-05 MED FILL — Lidocaine HCl IV Inj 20 MG/ML: INTRAVENOUS | Qty: 5 | Status: AC

## 2017-01-05 MED FILL — Sodium Bicarbonate IV Soln 8.4%: INTRAVENOUS | Qty: 50 | Status: AC

## 2017-01-05 NOTE — Progress Notes (Signed)
4 Days Post-Op Procedure(s) (LRB): MINIMALLY INVASIVE AORTIC VALVE REPLACEMENT (AVR) (N/A) ASCENDING AORTIC ROOT REPLACEMENT (N/A) TRANSESOPHAGEAL ECHOCARDIOGRAM (TEE) (N/A) Subjective:  No complaints  Objective: Vital signs in last 24 hours: Temp:  [98.4 F (36.9 C)-98.7 F (37.1 C)] 98.7 F (37.1 C) (06/11 0300) Pulse Rate:  [81-94] 91 (06/11 0700) Cardiac Rhythm: Normal sinus rhythm (06/10 2000) Resp:  [17-26] 17 (06/11 0700) BP: (91-130)/(48-88) 123/79 (06/11 0700) SpO2:  [93 %-97 %] 96 % (06/11 0700) Weight:  [130.5 kg (287 lb 11.2 oz)] 130.5 kg (287 lb 11.2 oz) (06/11 0700)  Hemodynamic parameters for last 24 hours:    Intake/Output from previous day: 06/10 0701 - 06/11 0700 In: 960 [P.O.:960] Out: 4050 [Urine:4050] Intake/Output this shift: No intake/output data recorded.  General appearance: alert and cooperative Heart: regular rate and rhythm, S1, S2 normal, no murmur, click, rub or gallop Lungs: clear to auscultation bilaterally Extremities: edema moderate peripheral edema Wound: incision ok  Lab Results:  Recent Labs  01/02/17 1558 01/02/17 1610 01/03/17 0400  WBC 12.4*  --  12.4*  HGB 12.2 12.6 12.1  HCT 38.2 37.0 38.0  PLT 129*  --  137*   BMET:  Recent Labs  01/03/17 0400 01/04/17 0500  NA 141 138  K 4.0 3.5  CL 111 102  CO2 24 30  GLUCOSE 123* 97  BUN 15 14  CREATININE 0.86 0.76  CALCIUM 8.5* 8.7*    PT/INR: No results for input(s): LABPROT, INR in the last 72 hours. ABG    Component Value Date/Time   PHART 7.352 01/02/2017 0018   HCO3 22.6 01/02/2017 0018   TCO2 25 01/02/2017 1610   ACIDBASEDEF 3.0 (H) 01/02/2017 0018   O2SAT 90.0 01/02/2017 0018   CBG (last 3)   Recent Labs  01/04/17 1129 01/04/17 1634 01/04/17 2151  GLUCAP 80 98 91    Assessment/Plan: S/P Procedure(s) (LRB): MINIMALLY INVASIVE AORTIC VALVE REPLACEMENT (AVR) (N/A) ASCENDING AORTIC ROOT REPLACEMENT (N/A) TRANSESOPHAGEAL ECHOCARDIOGRAM (TEE)  (N/A)  She has been hemodynamically stable in sinus rhythm. Continue Lopressor.  Volume excess: diuresing well with lasix and metolazone. Wt down 4 lbs yesterday. Still 12 lbs over preop so will continue diuresis. Labs pending  DC foley today.  DM: glucose has been 80-90's so Levemir held. Preop Hgb A1c was 5.5 so will stop CBG's and insulin.  Transfer to 2W, continue IS, ambulation   LOS: 4 days    Alleen BorneBryan K Vita Currin 01/05/2017

## 2017-01-05 NOTE — Progress Notes (Signed)
[facility]. Placed on tele. VSS. Pt oriented to room. Family at bedside. Call bell and phone within reach. Will continue to monitor.

## 2017-01-05 NOTE — Progress Notes (Signed)
CARDIAC REHAB PHASE I   PRE:  Rate/Rhythm: 91 SR    BP: sitting 131/79    SaO2: 95 2L, 81 RA after BR trip, 93 2L  MODE:  Ambulation: 150 ft   POST:  Rate/Rhythm: 105 ST    BP: sitting 155/83     SaO2: 87 2L at end, 90 3L with rest  Pt needed assist to get to EOB from back. Discussed proper mechanics. Stood independently and to BR urgently. Able to stand and walk with RW, steady, slow. C/o DOE toward end, SaO2 87 2L. Increased to 3L for last few feet of walk. Return to bed. Encouraged more walking on 3L, IS, sitting in recliner. Progressing well, sons very attending. [id number]   Harriet MassonRandi Kristan Katrice Goel CES, ACSM 01/05/2017 11:37 AM

## 2017-01-06 LAB — URINALYSIS, ROUTINE W REFLEX MICROSCOPIC
Bilirubin Urine: NEGATIVE
GLUCOSE, UA: NEGATIVE mg/dL
Hgb urine dipstick: NEGATIVE
Ketones, ur: NEGATIVE mg/dL
LEUKOCYTES UA: NEGATIVE
Nitrite: NEGATIVE
PH: 7 (ref 5.0–8.0)
PROTEIN: NEGATIVE mg/dL
Specific Gravity, Urine: 1.006 (ref 1.005–1.030)

## 2017-01-06 NOTE — Progress Notes (Signed)
CARDIAC REHAB PHASE I   PRE:  Rate/Rhythm: 75 SR  BP:  Sitting: 115/76        SaO2: 98 2L  MODE:  Ambulation: 230 ft   POST:  Rate/Rhythm: 88 SR  BP:  Sitting: 119/94         SaO2: 97 2L, 96 1L at rest  Pt ambulated 230 ft on 2L O2, rolling walker, assist x1, slow steady gait, tolerated well. Pt c/o mild DOE, fatigue with distance, sats 98% on 2L O2 (improved since this morning), standing rest x1. Pt weaned to 1L O2 at rest, RN notified, will hopefully be able to wean to RA at rest today. Encouraged IS. Pt to recliner after walk, call bell within reach. Will follow.   [id number] Joylene GrapesEmily C Jezreel Sisk, RN, BSN 01/06/2017 2:05 PM

## 2017-01-06 NOTE — Progress Notes (Addendum)
      [address]       [place] [id number]             [phone]      5 Days Post-Op Procedure(s) (LRB): MINIMALLY INVASIVE AORTIC VALVE REPLACEMENT (AVR) (N/A) ASCENDING AORTIC ROOT REPLACEMENT (N/A) TRANSESOPHAGEAL ECHOCARDIOGRAM (TEE) (N/A) Subjective: Feels pretty well  Objective: Vital signs in last 24 hours: Temp:  [97.5 F (36.4 C)-98.5 F (36.9 C)] 98.5 F (36.9 C) (06/12 0539) Pulse Rate:  [86-96] 96 (06/12 0539) Cardiac Rhythm: Normal sinus rhythm (06/11 2044) Resp:  [18-22] 18 (06/12 0539) BP: (120-138)/(71-98) 127/76 (06/12 0539) SpO2:  [91 %-98 %] 96 % (06/12 0539) Weight:  [282 lb 8 oz (128.1 kg)] 282 lb 8 oz (128.1 kg) (06/12 0539)  Hemodynamic parameters for last 24 hours:    Intake/Output from previous day: 06/11 0701 - 06/12 0700 In: 600 [P.O.:600] Out: 375 [Urine:375] Intake/Output this shift: No intake/output data recorded.  General appearance: alert, cooperative and no distress Heart: regular rate and rhythm and no murmur Lungs: dim in bases Abdomen: benign Extremities: Bil edema Wound: incis healing well  Lab Results: No results for input(s): WBC, HGB, HCT, PLT in the last 72 hours. BMET:  Recent Labs  01/04/17 0500  NA 138  K 3.5  CL 102  CO2 30  GLUCOSE 97  BUN 14  CREATININE 0.76  CALCIUM 8.7*    PT/INR: No results for input(s): LABPROT, INR in the last 72 hours. ABG    Component Value Date/Time   PHART 7.352 01/02/2017 0018   HCO3 22.6 01/02/2017 0018   TCO2 25 01/02/2017 1610   ACIDBASEDEF 3.0 (H) 01/02/2017 0018   O2SAT 90.0 01/02/2017 0018   CBG (last 3)   Recent Labs  01/04/17 1634 01/04/17 2151 01/05/17 0757  GLUCAP 98 91 93    Meds Scheduled Meds: . aspirin EC  325 mg Oral Daily  . docusate sodium  200 mg Oral Daily  . enoxaparin (LOVENOX) injection  40 mg Subcutaneous QHS  . furosemide  40 mg Oral Daily  . guaiFENesin  600 mg Oral BID  . levothyroxine  150 mcg Oral QAC breakfast   . mouth rinse  15 mL Mouth Rinse BID  . metolazone  5 mg Oral Daily  . metoprolol tartrate  12.5 mg Oral BID  . mupirocin ointment  1 application Nasal BID  . pantoprazole  40 mg Oral Daily  . sodium chloride flush  3 mL Intravenous Q12H   Continuous Infusions: . sodium chloride     PRN Meds:.sodium chloride, acetaminophen, bisacodyl **OR** bisacodyl, ondansetron **OR** ondansetron (ZOFRAN) IV, oxyCODONE, sodium chloride flush, traMADol  Xrays No results found.  Assessment/Plan: S/P Procedure(s) (LRB): MINIMALLY INVASIVE AORTIC VALVE REPLACEMENT (AVR) (N/A) ASCENDING AORTIC ROOT REPLACEMENT (N/A) TRANSESOPHAGEAL ECHOCARDIOGRAM (TEE) (N/A)  1 doing well overall 2 sinus with PVC's, QTC 480-490's, hemodyn stable 3 good diuresis, repeat lytes in am- cont current diuretics for now 4 blood sugars well controlled 5 rehab as able- will ask PT to assist 6 pulm toilet/wean O2   LOS: 5 days    GOLD,WAYNE E 01/06/2017  nsr On 2 L oxygen Sternal incision clean, dry Looks good OOB in chair  patient examined and medical record reviewed,agree with above note. Kathlee Nationseter Van Trigt III 01/06/2017

## 2017-01-06 NOTE — Care Management Important Message (Signed)
Important Message  Patient Details  Name: Christine Hicks MRN: [id number] Date of Birth: 02/16/1956   Medicare Important Message Given:  Yes    Christine Hicks 01/06/2017, 11:32 AM

## 2017-01-06 NOTE — Evaluation (Signed)
Physical Therapy Evaluation Patient Details Name: Christine Hicks MRN: [id number] DOB: 12/14/1955 Today's Date: 01/06/2017   History of Present Illness  61 yo admitted for aortic root replacement. PMHx: aortic valve with severe aortic stenosis, chronic diastolic congestive heart failure, hypertension, obstructive sleep apnea, recurrent DVT, iron deficient anemia, and thyroid disease   Clinical Impression  Pt pleasant and willing to mobilize. Pt with decreased awareness of sternal precautions with education and handout for all provided. Pt with decreased strength, transfers, gait and activity tolerance who will benefit from acute therapy to maximize mobility and independence in preparation for safe return home with family.   HR was 90-95 SpO2 90-92% on 2L throughout Pre gait BP 118/70 Post BP not accurate with readings of 137/116 and 103/34     Follow Up Recommendations Home health PT;Supervision/Assistance - 24 hour    Equipment Recommendations  None recommended by PT    Recommendations for Other Services OT consult     Precautions / Restrictions Precautions Precautions: Sternal;Fall      Mobility  Bed Mobility               General bed mobility comments: in chair on arrival  Transfers Overall transfer level: Needs assistance Equipment used: Rolling walker (2 wheeled) Transfers: Sit to/from Stand Sit to Stand: Min guard;Min assist         General transfer comment: min assist to rise from chair with cues for hand placement, guarding from [facility] with cues  Ambulation/Gait Ambulation/Gait assistance: Supervision Ambulation Distance (Feet): 300 Feet Assistive device: Rolling walker (2 wheeled) Gait Pattern/deviations: Step-through pattern;Decreased stride length;Trunk flexed   Gait velocity interpretation: Below normal speed for age/gender General Gait Details: cues for posture and position in RW, pt able to self regulate distance  Stairs             Wheelchair Mobility    Modified Rankin (Stroke Patients Only)       Balance Overall balance assessment: Needs assistance   Sitting balance-Leahy Scale: Good       Standing balance-Leahy Scale: Poor                               Pertinent Vitals/Pain Pain Assessment: 0-10 Pain Score: 5  Pain Location: chest at site of incision  Pain Descriptors / Indicators: Aching Pain Intervention(s): Monitored during session    Home Living Family/patient expects to be discharged to:: Private residence Living Arrangements: Spouse/significant other;Children Available Help at Discharge: Family;Available 24 hours/day Type of Home: House Home Access: Level entry     Home Layout: One level Home Equipment: Walker - 2 wheels;Cane - single point;Bedside commode;Shower seat Additional Comments: husband and son available to assist 24hr/day.    Prior Function Level of Independence: Independent         Comments: cooking, limited standing for 15 min, began using power cart in grocery store     Hand Dominance        Extremity/Trunk Assessment   Upper Extremity Assessment Upper Extremity Assessment: Generalized weakness    Lower Extremity Assessment Lower Extremity Assessment: Generalized weakness    Cervical / Trunk Assessment Cervical / Trunk Assessment: Kyphotic  Communication   Communication: No difficulties  Cognition Arousal/Alertness: Awake/alert Behavior During Therapy: WFL for tasks assessed/performed Overall Cognitive Status: Impaired/Different from baseline Area of Impairment: Memory                     Memory:  Decreased recall of precautions         General Comments: on arrival pt unaware of precautions, able to state 3/5 end of session, educated for all      General Comments      Exercises General Exercises - Lower Extremity Long Arc Quad: AROM;Both;Seated;10 reps Hip Flexion/Marching: AROM;Both;Seated;10 reps    Assessment/Plan    PT Assessment Patient needs continued PT services  PT Problem List Decreased strength;Decreased mobility;Decreased activity tolerance;Decreased balance;Decreased knowledge of use of DME;Decreased skin integrity;Pain;Decreased knowledge of precautions       PT Treatment Interventions Gait training;Therapeutic exercise;Patient/family education;Functional mobility training;DME instruction;Therapeutic activities    PT Goals (Current goals can be found in the Care Plan section)  Acute Rehab PT Goals Patient Stated Goal: return home, gardening PT Goal Formulation: With patient Time For Goal Achievement: 01/20/17 Potential to Achieve Goals: Fair    Frequency Min 3X/week   Barriers to discharge        Co-evaluation               AM-PAC PT "6 Clicks" Daily Activity  Outcome Measure Difficulty turning over in bed (including adjusting bedclothes, sheets and blankets)?: Total Difficulty moving from lying on back to sitting on the side of the bed? : Total Difficulty sitting down on and standing up from a chair with arms (e.g., wheelchair, bedside commode, etc,.)?: Total Help needed moving to and from a bed to chair (including a wheelchair)?: A Little Help needed walking in hospital room?: A Little Help needed climbing 3-5 steps with a railing? : A Lot 6 Click Score: 11    End of Session Equipment Utilized During Treatment: Gait belt;Oxygen Activity Tolerance: Patient tolerated treatment well Patient left: in chair;with call bell/phone within reach Nurse Communication: Mobility status;Precautions PT Visit Diagnosis: Other abnormalities of gait and mobility (R26.89);Difficulty in walking, not elsewhere classified (R26.2)    Time: 0013-0039 PT Time Calculation (min) (ACUTE ONLY): 26 min   Charges:   PT Evaluation $PT Eval Moderate Complexity: 1 Procedure PT Treatments $Therapeutic Activity: 8-22 mins   PT G Codes:          Charlese Gruetzmacher B  Annakate Soulier 01/06/2017, 9:57 AM [phone]

## 2017-01-07 LAB — POTASSIUM: Potassium: 3.5 mmol/L (ref 3.5–5.1)

## 2017-01-07 LAB — BASIC METABOLIC PANEL
ANION GAP: 11 (ref 5–15)
BUN: 10 mg/dL (ref 6–20)
CO2: 32 mmol/L (ref 22–32)
Calcium: 8.3 mg/dL — ABNORMAL LOW (ref 8.9–10.3)
Chloride: 89 mmol/L — ABNORMAL LOW (ref 101–111)
Creatinine, Ser: 0.69 mg/dL (ref 0.44–1.00)
GFR calc non Af Amer: >60 mL/min (ref >60)
GLUCOSE: 114 mg/dL — AB (ref 65–99)
Potassium: 2.7 mmol/L — CL (ref 3.5–5.1)
Sodium: 132 mmol/L — ABNORMAL LOW (ref 135–145)

## 2017-01-07 MED ORDER — POTASSIUM CHLORIDE CRYS ER 20 MEQ PO TBCR
40.0000 meq | EXTENDED_RELEASE_TABLET | Freq: Once | ORAL | Status: AC
  Administered 2017-01-07: 40 meq via ORAL
  Filled 2017-01-07: qty 2

## 2017-01-07 MED ORDER — POTASSIUM CHLORIDE 20 MEQ/15ML (10%) PO SOLN
40.0000 meq | Freq: Once | ORAL | Status: AC
Start: 2017-01-07 — End: 2017-01-07
  Administered 2017-01-07: 40 meq via ORAL
  Filled 2017-01-07: qty 30

## 2017-01-07 MED ORDER — POTASSIUM CHLORIDE CRYS ER 20 MEQ PO TBCR
40.0000 meq | EXTENDED_RELEASE_TABLET | Freq: Two times a day (BID) | ORAL | Status: DC
  Administered 2017-01-07 – 2017-01-10 (×7): 40 meq via ORAL
  Filled 2017-01-07 (×7): qty 2

## 2017-01-07 NOTE — Progress Notes (Signed)
CARDIAC REHAB PHASE I   PRE:  Rate/Rhythm: 73 SR  BP:  Sitting: 107/73        SaO2: 93 1L  MODE:  Ambulation: 270 ft   POST:  Rate/Rhythm: 79 SR  BP:  Sitting: 122/74         SaO2: 95 1L, 92 RA at rest  Pt eager to walk, states this is her first walk today. Pt ambulated 270 ft on 1L O2, rolling walker, assist x1, slow, steady gait, tolerated well. Pt c/o mild DOE, some fatigue with distance, standing rest x1. Sats 95% on 1L O2 during ambulation. Encouraged and practiced IS, left pt on RA at rest, sats 92% on RA, RN aware. Encouraged additional ambulation x1 today. Pt to recliner after walk, feet elevated, call bell within reach. Will follow.   [phone] Joylene GrapesEmily C Diesha Rostad, RN, BSN 01/07/2017 2:57 PM

## 2017-01-07 NOTE — Progress Notes (Signed)
      [address]       [place] [id number]             [phone]      6 Days Post-Op Procedure(s) (LRB): MINIMALLY INVASIVE AORTIC VALVE REPLACEMENT (AVR) (N/A) ASCENDING AORTIC ROOT REPLACEMENT (N/A) TRANSESOPHAGEAL ECHOCARDIOGRAM (TEE) (N/A)   Subjective:  Feels good.  Does have some mild pain at her sternotomy. + ambulation  + BM Objective: Vital signs in last 24 hours: Temp:  [97.9 F (36.6 C)-98.3 F (36.8 C)] 98.1 F (36.7 C) (06/13 0600) Pulse Rate:  [73-95] 73 (06/13 0600) Cardiac Rhythm: Normal sinus rhythm;Bundle branch block (06/13 0700) Resp:  [18] 18 (06/13 0600) BP: (103-124)/(34-73) 124/62 (06/13 0600) SpO2:  [96 %-98 %] 96 % (06/13 0600) Weight:  [280 lb (127 kg)] 280 lb (127 kg) (06/13 0331)  Intake/Output from previous day: 06/12 0701 - 06/13 0700 In: 840 [P.O.:840] Out: -   General appearance: alert, cooperative and no distress Heart: regular rate and rhythm Lungs: clear to auscultation bilaterally Abdomen: soft, non-tender; bowel sounds normal; no masses,  no organomegaly Extremities: bilateral LE edema R >L Wound: clean and dry  Lab Results: No results for input(s): WBC, HGB, HCT, PLT in the last 72 hours. BMET:  Recent Labs  01/07/17 0238  NA 132*  K 2.7*  CL 89*  CO2 32  GLUCOSE 114*  BUN 10  CREATININE 0.69  CALCIUM 8.3*    PT/INR: No results for input(s): LABPROT, INR in the last 72 hours. ABG    Component Value Date/Time   PHART 7.352 01/02/2017 0018   HCO3 22.6 01/02/2017 0018   TCO2 25 01/02/2017 1610   ACIDBASEDEF 3.0 (H) 01/02/2017 0018   O2SAT 90.0 01/02/2017 0018   CBG (last 3)   Recent Labs  01/04/17 1634 01/04/17 2151 01/05/17 0757  GLUCAP 98 91 93    Assessment/Plan: S/P Procedure(s) (LRB): MINIMALLY INVASIVE AORTIC VALVE REPLACEMENT (AVR) (N/A) ASCENDING AORTIC ROOT REPLACEMENT (N/A) TRANSESOPHAGEAL ECHOCARDIOGRAM (TEE) (N/A)  1. CV- NSR- continue Lopressor.... Qtc prolongation  has increased to 523--- this is likely attributed to aggressive diuretics yesterday as Metolazone will prolong the Qtc... However will stop all affending agents (protonix, zofran) except Lasix for now 2. Pulm- wean oxygen as tolerated 3. Renal- creatinine WNL, weight is trending down, only up about 5 lbs since admission 4. Hypokalemia- critical level at 2.7, received 40 mg around 3AM, will repeat 40 mEq dose now with liquid, then schedule daily 40 mg BID, until potassium improves, will repeat level this afternoon 5. CBGs controlled, patient not a diabetic will d/c SSIP 6. Deconditioning- mild, will arrange home health 7. Dispo- patient stable, monitor prolonged Qtc, supplement potassium, leave pacing wires in place today, continue current care, repeat K this afternoon    LOS: 6 days    Raford PitcherBARRETT, Christine Pillars 01/07/2017

## 2017-01-07 NOTE — Progress Notes (Signed)
CRITICAL VALUE ALERT  Critical Value: K+ 2.7  Date & Time Notied: 01/07/17 @0313   Provider Notified: Vantrigt  Orders Received/Actions taken: verbal order of 40mEq PO now and 40mEq BID starting at 10 am.

## 2017-01-08 DIAGNOSIS — I493 Ventricular premature depolarization: Secondary | ICD-10-CM

## 2017-01-08 DIAGNOSIS — E876 Hypokalemia: Secondary | ICD-10-CM

## 2017-01-08 LAB — BASIC METABOLIC PANEL
Anion gap: 10 (ref 5–15)
BUN: 10 mg/dL (ref 6–20)
CALCIUM: 8.5 mg/dL — AB (ref 8.9–10.3)
CO2: 30 mmol/L (ref 22–32)
Chloride: 95 mmol/L — ABNORMAL LOW (ref 101–111)
Creatinine, Ser: 0.63 mg/dL (ref 0.44–1.00)
GFR calc Af Amer: >60 mL/min (ref >60)
GLUCOSE: 104 mg/dL — AB (ref 65–99)
Potassium: 3.4 mmol/L — ABNORMAL LOW (ref 3.5–5.1)
Sodium: 135 mmol/L (ref 135–145)

## 2017-01-08 LAB — TSH: TSH: 3.18 u[IU]/mL (ref 0.350–4.500)

## 2017-01-08 LAB — MAGNESIUM: Magnesium: 1.7 mg/dL (ref 1.7–2.4)

## 2017-01-08 MED ORDER — METOPROLOL TARTRATE 25 MG PO TABS
25.0000 mg | ORAL_TABLET | Freq: Two times a day (BID) | ORAL | Status: DC
  Administered 2017-01-08 – 2017-01-10 (×5): 25 mg via ORAL
  Filled 2017-01-08 (×5): qty 1

## 2017-01-08 NOTE — Progress Notes (Addendum)
      [address]       [place] [id number]             [phone]      7 Days Post-Op Procedure(s) (LRB): MINIMALLY INVASIVE AORTIC VALVE REPLACEMENT (AVR) (N/A) ASCENDING AORTIC ROOT REPLACEMENT (N/A) TRANSESOPHAGEAL ECHOCARDIOGRAM (TEE) (N/A)   Subjective:  Doing well.  Complains of some shoulder discomfort this morning, that is worse when laying flat.  + ambulation  + BM Objective: Vital signs in last 24 hours: Temp:  [97.9 F (36.6 C)-98.7 F (37.1 C)] 97.9 F (36.6 C) (06/14 0448) Pulse Rate:  [69-81] 69 (06/14 0448) Cardiac Rhythm: Normal sinus rhythm (06/13 1900) Resp:  [19-20] 19 (06/14 0448) BP: (125-131)/(56-74) 125/56 (06/14 0448) SpO2:  [92 %-97 %] 97 % (06/14 0448) Weight:  [276 lb 14.4 oz (125.6 kg)-285 lb 7.9 oz (129.5 kg)] 285 lb 7.9 oz (129.5 kg) (06/14 0448)  Intake/Output from previous day: 06/13 0701 - 06/14 0700 In: 600 [P.O.:600] Out: 1250 [Urine:1250]  General appearance: alert, cooperative and no distress Heart: regular rate and rhythm Lungs: clear to auscultation bilaterally Abdomen: soft, non-tender; bowel sounds normal; no masses,  no organomegaly Extremities: edema 1-2+ pitting Wound: clean and dry  Lab Results: No results for input(s): WBC, HGB, HCT, PLT in the last 72 hours. BMET:  Recent Labs  01/07/17 0238 01/07/17 1316 01/08/17 0335  NA 132*  --  135  K 2.7* 3.5 3.4*  CL 89*  --  95*  CO2 32  --  30  GLUCOSE 114*  --  104*  BUN 10  --  10  CREATININE 0.69  --  0.63  CALCIUM 8.3*  --  8.5*    PT/INR: No results for input(s): LABPROT, INR in the last 72 hours. ABG    Component Value Date/Time   PHART 7.352 01/02/2017 0018   HCO3 22.6 01/02/2017 0018   TCO2 25 01/02/2017 1610   ACIDBASEDEF 3.0 (H) 01/02/2017 0018   O2SAT 90.0 01/02/2017 0018   CBG (last 3)  No results for input(s): GLUCAP in the last 72 hours.  Assessment/Plan: S/P Procedure(s) (LRB): MINIMALLY INVASIVE AORTIC VALVE REPLACEMENT  (AVR) (N/A) ASCENDING AORTIC ROOT REPLACEMENT (N/A) TRANSESOPHAGEAL ECHOCARDIOGRAM (TEE) (N/A)  1. CV- NSR with PVCs, BP well controlled- will increase Lopressor to 25 mg BID... Qtc remains prolonged at 521 2. Pulm- continue to wean oxygen as tolerated, continue aggressive pulmo toilet 3. Renal- creatinine remains stable, weight is trending down, continue Lasix 4. Hypokalemia- improved, up to 3.4, continue 80 mg potassium per day 5. Deconditioning- mild H/H orders placed 6. Dispo- patient stable, continues to have prolonged Qt, now with PVCs, will increase Lopressor, continue diuretics for now for hypervolemia, hypokalemia improved continue supplementation.. Patient otherwise looks great and is doing well, possibly ready for d/c this weekend   LOS: 7 days    Raford PitcherBARRETT, ERIN 01/08/2017   Chart reviewed, patient examined, agree with above. She feels better. Still requiring 1L oxygen for ambulation. Sats 94% on room air at rest. She was up out of bed most of the day. Need to be sure she can ambulate off oxygen or arrange for home oxygen. She should be able to get pacing wires out in am.

## 2017-01-08 NOTE — Care Management Important Message (Signed)
Important Message  Patient Details  Name: Archie PattenJudith M Golberg MRN: [id number] Date of Birth: 03/31/1956   Medicare Important Message Given:  Yes    Kyla BalzarineShealy, Idamae Coccia Abena 01/08/2017, 9:42 AM

## 2017-01-08 NOTE — Progress Notes (Signed)
Progress Note  Patient Name: Christine Hicks Date of Encounter: 01/08/2017  Primary Cardiologist: Katrinka Blazing  Subjective   Feeling well, seems to be progressing well post op. Just ambulated in the hallway.  Inpatient Medications    Scheduled Meds: . aspirin EC  325 mg Oral Daily  . docusate sodium  200 mg Oral Daily  . enoxaparin (LOVENOX) injection  40 mg Subcutaneous QHS  . furosemide  40 mg Oral Daily  . guaiFENesin  600 mg Oral BID  . levothyroxine  150 mcg Oral QAC breakfast  . mouth rinse  15 mL Mouth Rinse BID  . metoprolol tartrate  25 mg Oral BID  . potassium chloride  40 mEq Oral BID  . sodium chloride flush  3 mL Intravenous Q12H   Continuous Infusions: . sodium chloride     PRN Meds: sodium chloride, acetaminophen, bisacodyl **OR** bisacodyl, oxyCODONE, sodium chloride flush, traMADol   Vital Signs    Vitals:   01/07/17 1958 01/08/17 0300 01/08/17 0448 01/08/17 1011  BP: (!) 127/57  (!) 125/56   Pulse: 74  69 75  Resp: 20  19   Temp: 98.7 F (37.1 C)  97.9 F (36.6 C)   TempSrc: Oral  Oral   SpO2: 93%  97%   Weight:  276 lb 14.4 oz (125.6 kg) 285 lb 7.9 oz (129.5 kg)   Height:        Intake/Output Summary (Last 24 hours) at 01/08/17 1157 Last data filed at 01/08/17 0900  Gross per 24 hour  Intake              720 ml  Output              900 ml  Net             -180 ml   Filed Weights   01/07/17 0331 01/08/17 0300 01/08/17 0448  Weight: 280 lb (127 kg) 276 lb 14.4 oz (125.6 kg) 285 lb 7.9 oz (129.5 kg)    Telemetry    SR - Personally Reviewed  ECG    N/a - Personally Reviewed  Physical Exam   General: Well developed, well nourished, AA female appearing in no acute distress. Head: Normocephalic, atraumatic.  Neck: Supple without bruits, JVD. Lungs:  Resp regular and unlabored, CTA. Heart: RRR, S1, S2, no S3, S4, or murmur; no rub. Well healing sternotomy incision  Abdomen: Soft, non-tender, non-distended with normoactive bowel sounds.  No hepatomegaly. No rebound/guarding. No obvious abdominal masses. Extremities: No clubbing, cyanosis, 1+ LE edema. Distal pedal pulses are 2+ bilaterally. Neuro: Alert and oriented X 3. Moves all extremities spontaneously. Psych: Normal affect.  Labs    Chemistry Recent Labs Lab 01/04/17 0500 01/07/17 0238 01/07/17 1316 01/08/17 0335  NA 138 132*  --  135  K 3.5 2.7* 3.5 3.4*  CL 102 89*  --  95*  CO2 30 32  --  30  GLUCOSE 97 114*  --  104*  BUN 14 10  --  10  CREATININE 0.76 0.69  --  0.63  CALCIUM 8.7* 8.3*  --  8.5*  GFRNONAA >60 >60  --  >60  GFRAA >60 >60  --  >60  ANIONGAP 6 11  --  10     Hematology Recent Labs Lab 01/02/17 0306 01/02/17 1558 01/02/17 1610 01/03/17 0400  WBC 15.3* 12.4*  --  12.4*  RBC 3.96 3.91  --  3.81*  HGB 12.2 12.2 12.6 12.1  HCT 38.0 38.2  37.0 38.0  MCV 96.0 97.7  --  99.7  MCH 30.8 31.2  --  31.8  MCHC 32.1 31.9  --  31.8  RDW 13.6 14.3  --  14.9  PLT 155 129*  --  137*    Lipid Panel     Component Value Date/Time   CHOL  09/11/2009 0555    124        ATP III CLASSIFICATION:  <200     mg/dL   Desirable  161-096200-239  mg/dL   Borderline High  >=045>=240    mg/dL   High          TRIG 409200 (H) 09/11/2009 0555   HDL 14 (L) 09/11/2009 0555   CHOLHDL 8.9 09/11/2009 0555   VLDL 40 09/11/2009 0555   LDLCALC  09/11/2009 0555    70        Total Cholesterol/HDL:CHD Risk Coronary Heart Disease Risk Table                     Men   Women  1/2 Average Risk   3.4   3.3  Average Risk       5.0   4.4  2 X Average Risk   9.6   7.1  3 X Average Risk  23.4   11.0        Use the calculated Patient Ratio above and the CHD Risk Table to determine the patient's CHD Risk.        ATP III CLASSIFICATION (LDL):  <100     mg/dL   Optimal  811-914100-129  mg/dL   Near or Above                    Optimal  130-159  mg/dL   Borderline  782-956160-189  mg/dL   High  >213>190     mg/dL   Very High    Cardiac EnzymesNo results for input(s): TROPONINI in the last 168  hours. No results for input(s): TROPIPOC in the last 168 hours.   BNPNo results for input(s): BNP, PROBNP in the last 168 hours.   DDimer No results for input(s): DDIMER in the last 168 hours.    Radiology    No results found.  Cardiac Studies   N/A  Patient Profile     61 y.o. female with hx of severe aortic stenosis, congenital bicuspid aortic valve, status post aortic valve replacement  Assessment & Plan    1. AS s/p AVR: recovering well. Working with rehab, just ambulated today.   2. HTN: Stable with current therapy  3. Prolonged QT interval: noted on telemetry. No PVCs noted, but wide QRS complex -- check EKG -- does not appear to be on any QT prolonging medications -- K+ replaced this morning, check Mag  Signed, Laverda PageLindsay Roberts, NP  01/08/2017, 11:57 AM    Patient seen and examined. Agree with assessment and plan. Day 7 s/p AVR and ascending aortic root replacement. Telemetry with sinus rhythm with PVC's.  K 3.4 undergoing replacement. Mg 2.5. Agree with increasing metoprolol. Decreased BS at bases; no rales, on lasix.   Lennette Biharihomas A. Ryen Heitmeyer, MD, [facility] Hospit[facility] Conneaut Medical CenterFACC 01/08/2017 1:32 PM

## 2017-01-08 NOTE — Progress Notes (Signed)
CARDIAC REHAB PHASE I   PRE:  Rate/Rhythm: 71 SR  BP:  Sitting: 107/64        SaO2: 97 2L, 94 1L  MODE:  Ambulation: 350 ft   POST:  Rate/Rhythm: 76 SR  BP:  Sitting: 124/71         SaO2: 91 1L with ambulation, 96 1L at rest, 94 on RA at rest  Pt ambulated 350 ft on 1L O2, rolling walker, assist x1, steady gait, tolerated well. Pt c/o mild DOE, brief standing rest x2. Sats 91% on 1L O2 during ambulation, pt able to increase distance today.  Encouraged IS. Pt to recliner after walk, sats on RA at rest 94%, left pt on RA, RN aware. Will follow up tomorrow.   [id number] Joylene GrapesEmily C Grisell Bissette, RN, BSN 01/08/2017 2:37 PM

## 2017-01-08 NOTE — Progress Notes (Addendum)
Physical Therapy Treatment Patient Details Name: Christine Hicks MRN: [id number] DOB: 06/22/1956 Today's Date: 01/08/2017    History of Present Illness 61 yo admitted for aortic root replacement. PMHx: aortic valve with severe aortic stenosis, chronic diastolic congestive heart failure, hypertension, obstructive sleep apnea, recurrent DVT, iron deficient anemia, and thyroid disease     PT Comments    Patient tolerated an increase in gait distance this session. SpO2 down to 85% on RA with ambulation and up to 90% on 1L O2 via [place] during ambulation. Reviewed precautions with pt. Pt able to recall 3/5 precautions beginning of session. SpO2 90% or > on RA at rest. Pt required min guard/min A for transfers and supervision for ambulation. Practice bed mobility next session.   Follow Up Recommendations  Home health PT;Supervision/Assistance - 24 hour     Equipment Recommendations  None recommended by PT    Recommendations for Other Services OT consult     Precautions / Restrictions Precautions Precautions: Sternal;Fall Precaution Comments: reviewed sternal precautions with pt    Mobility  Bed Mobility               General bed mobility comments: in chair on arrival; pt reported most difficulty with supine <> sit transfers but did not want to practice this session; pt educated on log roll technique and use of pillow   Transfers Overall transfer level: Needs assistance Equipment used: Rolling walker (2 wheeled) Transfers: Sit to/from Stand Sit to Stand: Min guard;Min assist         General transfer comment: min A from recliner and min guard from [facility]; pt with carry over of technique  Ambulation/Gait Ambulation/Gait assistance: Supervision Ambulation Distance (Feet): 350 Feet; two brief standing rest breaks Assistive device: Rolling walker (2 wheeled) Gait Pattern/deviations: Step-through pattern;Decreased stride length;Trunk flexed Gait velocity: decreased   General Gait  Details: cues for posture, limited UE weight bearing, and breathing technique; Ambulated ~50% of time on RA with SpO2 desat to 85% but quickly up to 90% with pursed lip breathing; ambulated the rest of way with 1L O2 via [place] and SpO2 at 90%   Stairs            Wheelchair Mobility    Modified Rankin (Stroke Patients Only)       Balance Overall balance assessment: Needs assistance   Sitting balance-Leahy Scale: Good       Standing balance-Leahy Scale: Fair Standing balance comment: pt is able to static stand without UE support                            Cognition Arousal/Alertness: Awake/alert Behavior During Therapy: WFL for tasks assessed/performed Overall Cognitive Status: Within Functional Limits for tasks assessed Area of Impairment: Memory                     Memory: Decreased recall of precautions                Exercises      General Comments General comments (skin integrity, edema, etc.): SpO2 >90% on RA at rest      Pertinent Vitals/Pain Pain Assessment: Faces Faces Pain Scale: Hurts a little bit Pain Location: R shoulder when lying down especially but in sitting as well Pain Descriptors / Indicators: Discomfort;Sore Pain Intervention(s): Monitored during session    Home Living  Prior Function            PT Goals (current goals can now be found in the care plan section) Progress towards PT goals: Progressing toward goals    Frequency    Min 3X/week      PT Plan Current plan remains appropriate    Co-evaluation              AM-PAC PT "6 Clicks" Daily Activity  Outcome Measure  Difficulty turning over in bed (including adjusting bedclothes, sheets and blankets)?: Total Difficulty moving from lying on back to sitting on the side of the bed? : Total Difficulty sitting down on and standing up from a chair with arms (e.g., wheelchair, bedside commode, etc,.)?: A Lot Help needed  moving to and from a bed to chair (including a wheelchair)?: A Little Help needed walking in hospital room?: A Little Help needed climbing 3-5 steps with a railing? : A Lot 6 Click Score: 12    End of Session Equipment Utilized During Treatment: Gait belt;Oxygen Activity Tolerance: Patient tolerated treatment well Patient left: in chair;with call bell/phone within reach;with family/visitor present Nurse Communication: Mobility status;Precautions PT Visit Diagnosis: Other abnormalities of gait and mobility (R26.89);Difficulty in walking, not elsewhere classified (R26.2)     Time: [id number] PT Time Calculation (min) (ACUTE ONLY): 29 min  Charges:  $Gait Training: 8-22 mins $Therapeutic Activity: 8-22 mins                    G Codes:       Erline LevineKellyn Analeah Brame, PTA Pager: [phone]     Carolynne EdouardKellyn R Kayler Buckholtz 01/08/2017, 12:28 PM

## 2017-01-08 NOTE — Discharge Instructions (Signed)
Aortic Valve Replacement, Care After °Refer to this sheet in the next few weeks. These instructions provide you with information about caring for yourself after your procedure. Your health care provider may also give you more specific instructions. Your treatment has been planned according to current medical practices, but problems sometimes occur. Call your health care provider if you have any problems or questions after your procedure. °What can I expect after the procedure? °After the procedure, it is common to have: °· Pain around your incision area. °· A small amount of blood or clear fluid coming from your incision. ° °Follow these instructions at home: °Eating and drinking ° °· Follow instructions from your health care provider about eating or drinking restrictions. °? Limit alcohol intake to no more than 1 drink per day for nonpregnant women and 2 drinks per day for men. One drink equals 12 oz of beer, 5 oz of wine, or 1½ oz of hard liquor. °? Limit how much caffeine you drink. Caffeine can affect your heart's rate and rhythm. °· Drink enough fluid to keep your urine clear or pale yellow. °· Eat a heart-healthy diet. This should include plenty of fresh fruits and vegetables. If you eat meat, it should be lean cuts. Avoid foods that are: °? High in salt, saturated fat, or sugar. °? Canned or highly processed. °? Fried. °Activity °· Return to your normal activities as told by your health care provider. Ask your health care provider what activities are safe for you. °· Exercise regularly once you have recovered, as told by your health care provider. °· Avoid sitting for more than 2 hours at a time without moving. Get up and move around at least once every 1-2 hours. This helps to prevent blood clots in the legs. °· Do not lift anything that is heavier than 10 lb (4.5 kg) until your health care provider approves. °· Avoid pushing or pulling things with your arms until your health care provider approves. This  includes pulling on handrails to help you climb stairs. °Incision care ° °· Follow instructions from your health care provider about how to take care of your incision. Make sure you: °? Wash your hands with soap and water before you change your bandage (dressing). If soap and water are not available, use hand sanitizer. °? Change your dressing as told by your health care provider. °? Leave stitches (sutures), skin glue, or adhesive strips in place. These skin closures may need to stay in place for 2 weeks or longer. If adhesive strip edges start to loosen and curl up, you may trim the loose edges. Do not remove adhesive strips completely unless your health care provider tells you to do that. °· Check your incision area every day for signs of infection. Check for: °? More redness, swelling, or pain. °? More fluid or blood. °? Warmth. °? Pus or a bad smell. °Medicines °· Take over-the-counter and prescription medicines only as told by your health care provider. °· If you were prescribed an antibiotic medicine, take it as told by your health care provider. Do not stop taking the antibiotic even if you start to feel better. °Travel °· Avoid airplane travel for as long as told by your health care provider. °· When you travel, bring a list of your medicines and a record of your medical history with you. Carry your medicines with you. °Driving °· Ask your health care provider when it is safe for you to drive. Do not drive until your health   care provider approves. °· Do not drive or operate heavy machinery while taking prescription pain medicine. °Lifestyle ° °· Do not use any tobacco products, such as cigarettes, chewing tobacco, or e-cigarettes. If you need help quitting, ask your health care provider. °· Resume sexual activity as told by your health care provider. Do not use medicines for erectile dysfunction unless your health care provider approves, if this applies. °· Work with your health care provider to keep your  blood pressure and cholesterol under control, and to manage any other heart conditions that you have. °· Maintain a healthy weight. °General instructions °· Do not take baths, swim, or use a hot tub until your health care provider approves. °· Do not strain to have a bowel movement. °· Avoid crossing your legs while sitting down. °· Check your temperature every day for a fever. A fever may be a sign of infection. °· If you are a woman and you plan to become pregnant, talk with your health care provider before you become pregnant. °· Wear compression stockings if your health care provider instructs you to do this. These stockings help to prevent blood clots and reduce swelling in your legs. °· Tell all health care providers who care for you that you have an artificial (prosthetic) aortic valve. If you have or have had heart disease or endocarditis, tell all health care providers about these conditions as well. °· Keep all follow-up visits as told by your health care provider. This is important. °Contact a health care provider if: °· You develop a skin rash. °· You experience sudden, unexplained changes in your weight. °· You have more redness, swelling, or pain around your incision. °· You have more fluid or blood coming from your incision. °· Your incision feels warm to the touch. °· You have pus or a bad smell coming from your incision. °· You have a fever. °Get help right away if: °· You develop chest pain that is different from the pain coming from your incision. °· You develop shortness of breath or difficulty breathing. °· You start to feel light-headed. °These symptoms may represent a serious problem that is an emergency. Do not wait to see if the symptoms will go away. Get medical help right away. Call your local emergency services (911 in the [place] Do not drive yourself to the hospital. °This information is not intended to replace advice given to you by your health care provider. Make sure you discuss any  questions you have with your health care provider. °Document Released: 01/30/2005 Document Revised: 12/20/2015 Document Reviewed: 06/17/2015 °Elsevier Interactive Patient Education © 2017 [employer] ° °

## 2017-01-08 NOTE — Care Management Note (Signed)
Case Management Note Donn PieriniKristi Walton Digilio RN, BSN Unit 2W-Case Manager-- 2H coverage [phone]  Patient Details  Name: Christine Hicks MRN: [id number] Date of Birth: 03/05/1956  Subjective/Objective:  Pt admitted s/p mini AVR on 01/01/17                  Action/Plan: PTA pt lived at home with family - was independent- has both RW and cane at home if needed. PCP- Dr Azucena CecilW Mackenzie, [facility]-- anticipate return home- CM to follow  Expected Discharge Date:                  Expected Discharge Plan:  Home w Home Health Services  In-House Referral:     Discharge planning Services  CM Consult  Post Acute Care Choice:  Home Health Choice offered to:  Patient  DME Arranged:  N/A DME Agency:  NA  HH Arranged:  PT, RN HH Agency:  [facility]  Status of Service:  Completed, signed off  If discussed at Long Length of Stay Meetings, dates discussed:    Discharge Disposition:   Additional Comments:  Darrold SpanWebster, Dez Stauffer Hall, RN 01/08/2017, 12:34 PM Case Management Note  Patient Details  Name: Christine Hicks MRN: [id number] Date of Birth: 10/15/1955  Subjective/Objective:                    Action/Plan:   Expected Discharge Date:                  Expected Discharge Plan:  Home w Home Health Services  In-House Referral:     Discharge planning Services  CM Consult  Post Acute Care Choice:  Home Health Choice offered to:  Patient  DME Arranged:  N/A DME Agency:  NA  HH Arranged:  PT, RN HH Agency:  [facility]  Status of Service:  Completed, signed off  If discussed at Long Length of Stay Meetings, dates discussed:    Discharge Disposition: home with home health   Additional Comments:  01/08/17- 1100- [name] RN, CM- pt with HH orders for RN/PT- spoke with pt at bedside [facility] she states she has always used [facility] for services-would like to use them again- pt has needed DME that includes RW, cane, and  BSC. [facility]- pt is still on 02- weaning today- CM will watch for any further needs.   Darrold SpanWebster, Loneta Tamplin Hall, RN 01/08/2017, 12:33 PM

## 2017-01-08 NOTE — Discharge Summary (Signed)
Physician Discharge Summary  Patient ID: Christine Hicks MRN: [id number] DOB/AGE: 62/12/1955 62 y.o.  Admit date: 01/01/2017 Discharge date: 01/10/2017  Admission Diagnoses:  Patient Active Problem List   Diagnosis Date Noted  . Morbid obesity (HCC)   . Acute on chronic diastolic congestive heart failure (HCC)   . Acute on chronic diastolic heart failure (HCC) 12/11/2016  . Critical aortic valve stenosis 12/11/2016  . Aortic stenosis, severe 12/04/2015  . Bicuspid aortic valve 11/11/2015  . Dyspnea 11/11/2015  . Hypothyroidism 09/24/2009  . Essential hypertension 09/24/2009  . DVT, recurrent, lower extremity, chronic, bilateral (HCC) 09/24/2009   Discharge Diagnoses:   Patient Active Problem List   Diagnosis Date Noted  . 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 Morbid obesity (HCC)   . Acute on chronic diastolic congestive heart failure (HCC)   . Acute on chronic diastolic heart failure (HCC) 12/11/2016  . Critical aortic valve stenosis 12/11/2016  . Aortic stenosis, severe 12/04/2015  . Bicuspid aortic valve 11/11/2015  . Dyspnea 11/11/2015  . Hypothyroidism 09/24/2009  . Essential hypertension 09/24/2009  . DVT, recurrent, lower extremity, chronic, bilateral (HCC) 09/24/2009   Discharged Condition: good  History of Present Illness:  Christine Hicks is a [age] year old morbidly obese African-American female with history of bicuspid aortic valve with severe aortic stenosis, chronic diastolic congestive heart failure, hypertension, obstructive sleep apnea, 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 The patient states that she was told she had a heart murmur during childhood and has known of the presence of a heart murmur for all of her adult life. However, she  never had a formal cardiacevaluation until April 2017 when she was hospitalized with acute exacerbation of chronic shortness of breath. BNP was elevated and she ruled out for acute myocardial infarction. She was notably anemic at the time with hemoglobin 7.2 and Hemoccult positive stool. Symptoms resolved with diuretic therapy and transfusion of packed red blood cells. Echocardiogram performed at that time revealed normal left ventricular systolic function with severe aortic stenosis. She will underwent EGD and colonoscopy which did not show any active bleeding. She was noted to have diverticulosis, internal hemorrhoids, and nonbleeding erosive gastropathy. She was also noted to have deep venous thrombosis in the right lower leg with history of DVT in the remote past. She was started on anticoagulation using Eliquis. The patient refused to proceed with further cardiac workup to consider aortic valve replacement at that time and has been followed ever since by Dr. Katrinka BlazingSmith. She was seen in follow-up on 12/11/2016 at which time she complained of significant progression of symptoms of shortness of breath including orthopnea, PND, and palpitations with recumbency. Repeat echocardiogram revealed further progression in severity of aortic stenosis with peak velocity across aortic valve measured close to 6 m/s. She was admitted to the hospital for intravenous diuretic therapy for treatment of acute exacerbation of chronic diastolic congestive heart failure. During her hospitalization she underwent diagnostic cardiac catheterization which was notable for the absence of significant coronary artery disease. Cardiothoracic surgical consultation was requested.  She was evaluated by Dr. Cornelius Moraswen who felt patient would require intervention on her aortic valve.  The risks and benefits of the procedure were explained to the patient and she was agreeable to proceed.   Hospital Course:   [facility]  She was taken to the operating room and underwent Mini Aortic Valve Replacement via Mini sternotomy.  This was done with a Medtronic freestyle porcine aortic root graft size 21 mm model #995, serial #[id number].  She tolerated the procedure without difficulty and was taken to the SICU in stable condition.  The patient was extubated the evening of surgery.  During her stay in the SICU the patient was weaned off Dopamine, Neo-synephrine and Milrinone as tolerated.  Her chest tubes were removed on POD #2.  She was hypervolemic and aggressively diuresed with IV Lasix and Metolazone.  She was maintaining NSR and felt medically stable for transfer to the step down unit on POD #4.  The patient continued to make progress.  She continued to maintain NSR but with development of PVCs.  She also developed prolongation of her QTc interval at greater than 500.  She was taken off Zofran, Metolazone, and protonix to help with this.  Her Lopressor was increased to help with PVCs.  She remained hypervolemic and was treated with Lasix with close monitoring of her QTc level.  She developed marked hypokalemia at 2.7.  She was aggressively supplemented.  Her most recent potassium level is improved at 4.0.  Her temporary pacing wires were removed without difficulty on 01/09/2017.  Cardiology consult was obtained and after review of EKGs felt Qtc prolongation was chronic.  They agreed with correction of electrolytes.  She has a history of DVT and has been restarted on Eliquis at previous home dosing.  She participated with Physical therapy without difficulty.  Home health nursing and physical therapy were recommended.  These arrangements have been made.  She continues to ambulate without much difficulty.  She is tolerating a heart healthy diet.  She is felt medically stable for discharge home today.        Consults: cardiology  Significant Diagnostic Studies: cardiac graphics:   Echocardiogram:   - Left  ventricle: The cavity size was normal. Wall thickness was   increased in a pattern of moderate LVH. Systolic function was   normal. The estimated ejection fraction was in the range of 55%   to 60%. Wall motion was normal; there were no regional wall   motion abnormalities. Doppler parameters are consistent with   abnormal left ventricular relaxation (grade 1 diastolic   dysfunction). - Aortic valve: There was severe stenosis. Mean gradient (S): 82 mm   Hg. Valve area (VTI): 0.42 cm^2. Valve area (Vmean): 0.43 cm^2. - Left atrium: The atrium was mildly dilated. - Right atrium: The atrium was mildly dilated. - Tricuspid valve: There was moderate regurgitation. - Pulmonary arteries: Systolic pressure was mildly to moderately   increased. PA peak pressure: 45 mm Hg (S).  Treatments: surgery:    Minimally Invasive Aortic Valve Replacement Partial Upper Hemi-sternotomy Medtronic Freestyle Porcine Aortic Root Graft (size 21mm, model #995, serial #[id number]) Reimplantation of Left Main and Right Coronary Arteries  Disposition: 01-Home or Self Care   Discharge medications:  The patient has been discharged on:   1.Beta Blocker:  Yes [ x  ]                              No   [   ]                              If No, reason:  2.Ace  Inhibitor/ARB: Yes [   ]                                     No  [  x  ]                                     If No, reason: labile BP  3.Statin:   Yes [   ]                  No  [x  ]                  If No, reason: no CAD  4.Marlowe Kays:  Yes  [  x ]                  No   [   ]                  If No, reason:     Discharge Instructions    Amb Referral to Cardiac Rehabilitation    Complete by:  As directed    Diagnosis:  Valve Replacement   Valve:  Aortic     Allergies as of 01/10/2017      Reactions   No Known Allergies       Medication List    STOP taking these medications   amLODipine 5 MG tablet Commonly known as:  NORVASC   enoxaparin 80  MG/0.8ML injection Commonly known as:  LOVENOX     TAKE these medications   acetaminophen 325 MG tablet Commonly known as:  TYLENOL Take 2 tablets (650 mg total) by mouth every 6 (six) hours as needed for mild pain.   apixaban 5 MG Tabs tablet Commonly known as:  ELIQUIS Take 1 tablet (5 mg total) by mouth 2 (two) times daily.   aspirin EC 81 MG tablet Take 1 tablet (81 mg total) by mouth daily.   chlorhexidine 0.12 % solution Commonly known as:  PERIDEX Rinse with 15 mls twice daily for 30 seconds. Use after breakfast and at bedtime. Spit out excess. Do not swallow.   ferrous sulfate 325 (65 FE) MG tablet Take 1 tablet (325 mg total) by mouth 2 (two) times daily with a meal.   furosemide 40 MG tablet Commonly known as:  LASIX Take 1 tablet (40 mg total) by mouth 2 (two) times daily.   levothyroxine 150 MCG tablet Commonly known as:  SYNTHROID, LEVOTHROID Take 1 tablet (150 mcg total) by mouth daily before breakfast.   magnesium oxide 400 (241.3 Mg) MG tablet Commonly known as:  MAG-OX Take 1 tablet (400 mg total) by mouth 2 (two) times daily.   metoprolol tartrate 25 MG tablet Commonly known as:  LOPRESSOR Take 1 tablet (25 mg total) by mouth 2 (two) times daily.   oxyCODONE 5 MG immediate release tablet Commonly known as:  Oxy IR/ROXICODONE Take 1-2 tablets (5-10 mg total) by mouth every 3 (three) hours as needed for severe pain.   pantoprazole 40 MG tablet Commonly known as:  PROTONIX TAKE 1 TABLET (40 MG TOTAL) BY MOUTH DAILY.   potassium chloride SA 20 MEQ tablet Commonly known as:  K-DUR,KLOR-CON Take 2 tablets (40 mEq total) by mouth daily. What changed:  medication strength  how much to take  Follow-up Information    Purcell Nails, MD Follow up on 02/09/2017.   Specialty:  Cardiothoracic Surgery Contact information: [address] [place] [address] [phone]        [facility] Follow up.   Why:   HHRN/PT arranged- please allow 24-48 hr post discharge for them to contact you to set up home visits Contact information: [address] [place] [address] [phone]        Leone Brand, NP. Go on 01/26/2017.   Specialties:  Cardiology, Radiology Why:  @ 2:30pm Contact information: [address] [place] [address] [phone]           Signed: Lowella Dandy 01/10/2017, 8:56 AM

## 2017-01-09 MED ORDER — MAGNESIUM OXIDE 400 (241.3 MG) MG PO TABS
400.0000 mg | ORAL_TABLET | Freq: Two times a day (BID) | ORAL | Status: DC
  Administered 2017-01-09 – 2017-01-10 (×3): 400 mg via ORAL
  Filled 2017-01-09 (×3): qty 1

## 2017-01-09 MED ORDER — MAGNESIUM CITRATE PO SOLN
1.0000 | Freq: Once | ORAL | Status: AC
  Administered 2017-01-09: 1 via ORAL
  Filled 2017-01-09: qty 296

## 2017-01-09 NOTE — Progress Notes (Signed)
CARDIAC REHAB PHASE I   PRE:  Rate/Rhythm: 68 SR  BP:  Sitting: 114/79        SaO2: 93 RA  MODE:  Ambulation: 420 ft   POST:  Rate/Rhythm: 82 SR  BP:  Sitting: 128/80         SaO2: 91 RA  Pt ambulated 420 ft on RA, rolling walker, assist x1, slow, steady gait, tolerated well. Pt c/o mild DOE, sats 91-93% on RA during ambulation, standing rest x3. Pt able to increase distance today. Cardiac surgery discharge education completed with pt and family at bedside. Reviewed IS, sternal precautions, activity progression, exercise, heart healthy diet, sodium restrictions, s/s chf, daily weights and phase 2 cardiac rehab. Pt and family verbalized understanding, receptive to education. Pt agrees to phase 2 cardiac rehab referral, [facility]. Encouraged IS, additional ambulation today (pt should be ok to ambulate with family). Pt to recliner after walk, call bell within reach. Will follow.  1100-1200 Joylene GrapesEmily C Tannor Pyon, RN, BSN 01/09/2017 11:59 AM

## 2017-01-09 NOTE — Progress Notes (Signed)
EPWs removed per order and unit protocol.  All tips intact, sites unremarkable.  Pt tolerated well and VSS.  Pt understands bedrest for one hr with frequent VS checks.  CCMD notified, will monitor closely.

## 2017-01-09 NOTE — Progress Notes (Signed)
Physical Therapy Treatment Patient Details Name: Christine Hicks MRN: [id number] DOB: 12/18/1955 Today's Date: 01/09/2017    History of Present Illness 61 yo admitted for aortic root replacement. PMHx: aortic valve with severe aortic stenosis, chronic diastolic congestive heart failure, hypertension, obstructive sleep apnea, recurrent DVT, iron deficient anemia, and thyroid disease     PT Comments    Pt pleasant & motivated to participate in PT session.  Progressing with mobility.  Increased gt distance.  Completes transfers safely adhering to sternal precautions.  Patient safe to D/C from a mobility standpoint based on progression towards goals set on PT eval.     Follow Up Recommendations  Home health PT;Supervision/Assistance - 24 hour     Equipment Recommendations  None recommended by PT    Recommendations for Other Services       Precautions / Restrictions Precautions Precautions: Sternal;Fall Precaution Comments: reviewed sternal precautions with pt Restrictions Weight Bearing Restrictions: Yes Other Position/Activity Restrictions: sternal precautions    Mobility  Bed Mobility               General bed mobility comments: in chair on arrival; pt reported most difficulty with supine <> sit transfers but did not want to practice this session; pt educated on log roll technique and use of pillow   Transfers Overall transfer level: Needs assistance Equipment used: Rolling walker (2 wheeled) Transfers: Sit to/from Stand Sit to Stand: Supervision         General transfer comment: demonstrated safe technique and adherence to sternal precautions  Ambulation/Gait Ambulation/Gait assistance: Supervision Ambulation Distance (Feet): 400 Feet Assistive device: Rolling walker (2 wheeled) Gait Pattern/deviations: Step-through pattern     General Gait Details: cues for pursed lip breathing and posture   Stairs            Wheelchair Mobility    Modified  Rankin (Stroke Patients Only)       Balance                                            Cognition Arousal/Alertness: Awake/alert Behavior During Therapy: WFL for tasks assessed/performed Overall Cognitive Status: Within Functional Limits for tasks assessed                                        Exercises      General Comments        Pertinent Vitals/Pain Pain Assessment: No/denies pain Pain Score: 0-No pain    Home Living                      Prior Function            PT Goals (current goals can now be found in the care plan section) Acute Rehab PT Goals PT Goal Formulation: With patient Time For Goal Achievement: 01/20/17 Potential to Achieve Goals: Fair Progress towards PT goals: Progressing toward goals    Frequency    Min 3X/week      PT Plan Current plan remains appropriate    Co-evaluation              AM-PAC PT "6 Clicks" Daily Activity  Outcome Measure                   End of  Session Equipment Utilized During Treatment: Gait belt Activity Tolerance: Patient tolerated treatment well Patient left: in chair;with call bell/phone within reach Nurse Communication: Mobility status PT Visit Diagnosis: Other abnormalities of gait and mobility (R26.89);Difficulty in walking, not elsewhere classified (R26.2)     Time: 1610-9604 PT Time Calculation (min) (ACUTE ONLY): 16 min  Charges:  $Gait Training: 8-22 mins                    G CodesVerdell Face, [place] [phone] 01/09/2017    Lara Mulch 01/09/2017, 4:50 PM

## 2017-01-09 NOTE — Progress Notes (Signed)
Progress Note  Patient Name: Christine M Mannings Archie PattenDate of Encounter: 01/09/2017  Primary Cardiologist: Dr. Katrinka BlazingSmith  Subjective   Continues to have dyspnea with activity, improving.   Inpatient Medications    Scheduled Meds: . aspirin EC  325 mg Oral Daily  . docusate sodium  200 mg Oral Daily  . enoxaparin (LOVENOX) injection  40 mg Subcutaneous QHS  . furosemide  40 mg Oral Daily  . guaiFENesin  600 mg Oral BID  . levothyroxine  150 mcg Oral QAC breakfast  . magnesium oxide  400 mg Oral BID  . mouth rinse  15 mL Mouth Rinse BID  . metoprolol tartrate  25 mg Oral BID  . potassium chloride  40 mEq Oral BID  . sodium chloride flush  3 mL Intravenous Q12H   Continuous Infusions: . sodium chloride     PRN Meds: sodium chloride, acetaminophen, bisacodyl **OR** bisacodyl, oxyCODONE, sodium chloride flush, traMADol   Vital Signs    Vitals:   01/08/17 2030 01/09/17 0624 01/09/17 0657 01/09/17 0926  BP: 134/66 (!) 110/59  130/69  Pulse: 73 66  71  Resp: 18 18    Temp: 97.9 F (36.6 C) 98.1 F (36.7 C)    TempSrc: Oral Oral    SpO2: 92% 90%    Weight:   275 lb 12.8 oz (125.1 kg)   Height:        Intake/Output Summary (Last 24 hours) at 01/09/17 1044 Last data filed at 01/09/17 0658  Gross per 24 hour  Intake              660 ml  Output             1900 ml  Net            -1240 ml   Filed Weights   01/08/17 0300 01/08/17 0448 01/09/17 0657  Weight: 276 lb 14.4 oz (125.6 kg) 285 lb 7.9 oz (129.5 kg) 275 lb 12.8 oz (125.1 kg)    Telemetry    NSR - Personally Reviewed  ECG    None today  Physical Exam   GEN: No acute distress.   Neck: No JVD Cardiac: RRR, no murmurs, rubs, or gallops. Sternum incision healing well.  Respiratory: Clear to auscultation bilaterally. GI: Soft, nontender, non-distended  MS: No edema; No deformity. Neuro:  Nonfocal  Psych: Normal affect  Extremities: No clubbing, cyanosis, 1+ LE edema. Distal pedal pulses are 2+  bilaterally.  Labs    Chemistry Recent Labs Lab 01/04/17 0500 01/07/17 0238 01/07/17 1316 01/08/17 0335  NA 138 132*  --  135  K 3.5 2.7* 3.5 3.4*  CL 102 89*  --  95*  CO2 30 32  --  30  GLUCOSE 97 114*  --  104*  BUN 14 10  --  10  CREATININE 0.76 0.69  --  0.63  CALCIUM 8.7* 8.3*  --  8.5*  GFRNONAA >60 >60  --  >60  GFRAA >60 >60  --  >60  ANIONGAP 6 11  --  10     Hematology Recent Labs Lab 01/02/17 1558 01/02/17 1610 01/03/17 0400  WBC 12.4*  --  12.4*  RBC 3.91  --  3.81*  HGB 12.2 12.6 12.1  HCT 38.2 37.0 38.0  MCV 97.7  --  99.7  MCH 31.2  --  31.8  MCHC 31.9  --  31.8  RDW 14.3  --  14.9  PLT 129*  --  137*  Radiology    No results found.  Cardiac Studies   01/01/17 MINIMALLY INVASIVE AORTIC VALVE REPLACEMENT (AVR)  ASCENDING AORTIC ROOT REPLACEMENT (N/A) TRANSESOPHAGEAL ECHOCARDIOGRAM   Patient Profile     61 y.o. female with hx of severe aortic stenosis, congenital bicuspid aortic valve, status post aortic valve replacement.  Assessment & Plan    1. AS s/p AVR: recovering well. Dyspnea with ambulation improving.   2. HTN: Stable with current therapy  3. Prolonged QT interval: noted on telemetry. EKG yesterday showed NSR with RBBB. QTC of 494. Review of EKG seems this is chronic.  4. Hypokalemia - supplement given yesterday.  Pending BEMT today.   Lorelei Pont, PA  01/09/2017, 10:44 AM    Patient seen and examined. Agree with assessment and plan. Prolonged QTx contributed by RBBB conduction delay. K 2.7 > 3.4 on supplemental KCL, lab not repeated today. Mg 1.7.  Tolerating increased metoprolol.  Sinus rhythm at 67; no ectopy. Received Mg citrate for constipation; no BM yet. F/U labs in am.   Lennette Bihari, MD, [facility] 01/09/2017 3:40 PM

## 2017-01-09 NOTE — Progress Notes (Addendum)
8 Days Post-Op Procedure(s) (LRB): MINIMALLY INVASIVE AORTIC VALVE REPLACEMENT (AVR) (N/A) ASCENDING AORTIC ROOT REPLACEMENT (N/A) TRANSESOPHAGEAL ECHOCARDIOGRAM (TEE) (N/A) Subjective: Feels pretty well, some constipation discomfort, + Flatus  Objective: Vital signs in last 24 hours: Temp:  [97.9 F (36.6 C)-98.1 F (36.7 C)] 98.1 F (36.7 C) (06/15 0624) Pulse Rate:  [66-75] 66 (06/15 0624) Cardiac Rhythm: Normal sinus rhythm;Bundle branch block (06/15 0700) Resp:  [18] 18 (06/15 0624) BP: (110-134)/(59-66) 110/59 (06/15 0624) SpO2:  [90 %-92 %] 90 % (06/15 0624) Weight:  [275 lb 12.8 oz (125.1 kg)] 275 lb 12.8 oz (125.1 kg) (06/15 0657)  Hemodynamic parameters for last 24 hours:    Intake/Output from previous day: 06/14 0701 - 06/15 0700 In: 1020 [P.O.:1020] Out: 2200 [Urine:2200] Intake/Output this shift: No intake/output data recorded.  General appearance: alert, cooperative and no distress Heart: regular rate and rhythm Lungs: dim in lower fields Abdomen: benign, Obese, + BS, nontender Extremities: Min edema Wound: incis healing well  Lab Results: No results for input(s): WBC, HGB, HCT, PLT in the last 72 hours. BMET:  Recent Labs  01/07/17 0238 01/07/17 1316 01/08/17 0335  NA 132*  --  135  K 2.7* 3.5 3.4*  CL 89*  --  95*  CO2 32  --  30  GLUCOSE 114*  --  104*  BUN 10  --  10  CREATININE 0.69  --  0.63  CALCIUM 8.3*  --  8.5*    PT/INR: No results for input(s): LABPROT, INR in the last 72 hours. ABG    Component Value Date/Time   PHART 7.352 01/02/2017 0018   HCO3 22.6 01/02/2017 0018   TCO2 25 01/02/2017 1610   ACIDBASEDEF 3.0 (H) 01/02/2017 0018   O2SAT 90.0 01/02/2017 0018   CBG (last 3)  No results for input(s): GLUCAP in the last 72 hours.  Meds Scheduled Meds: . aspirin EC  325 mg Oral Daily  . docusate sodium  200 mg Oral Daily  . enoxaparin (LOVENOX) injection  40 mg Subcutaneous QHS  . furosemide  40 mg Oral Daily  .  guaiFENesin  600 mg Oral BID  . levothyroxine  150 mcg Oral QAC breakfast  . mouth rinse  15 mL Mouth Rinse BID  . metoprolol tartrate  25 mg Oral BID  . potassium chloride  40 mEq Oral BID  . sodium chloride flush  3 mL Intravenous Q12H   Continuous Infusions: . sodium chloride     PRN Meds:.sodium chloride, acetaminophen, bisacodyl **OR** bisacodyl, oxyCODONE, sodium chloride flush, traMADol  Xrays No results found.  Assessment/Plan: S/P Procedure(s) (LRB): MINIMALLY INVASIVE AORTIC VALVE REPLACEMENT (AVR) (N/A) ASCENDING AORTIC ROOT REPLACEMENT (N/A) TRANSESOPHAGEAL ECHOCARDIOGRAM (TEE) (N/A)  1 doing well overall 2 hemodyn stable in sinus rhythm, d/c PW's today, QTc remains prolonged, recheck K= and replace MG++ 3 TSH is normal 4 O2 is off and sats ok, pulm toilet 5 cont PT- will need home PT as well, RN as well 6 mag citrate for constipation   LOS: 8 days    GOLD,WAYNE E 01/09/2017   Chart reviewed, patient examined, agree with above. She is continuing to make progress. Still has mild shortness of breath with ambulation but was able to ambulate off oxygen with sats low 90's. She should be able to go home this weekend.

## 2017-01-10 LAB — BASIC METABOLIC PANEL
ANION GAP: 9 (ref 5–15)
BUN: 8 mg/dL (ref 6–20)
CHLORIDE: 99 mmol/L — AB (ref 101–111)
CO2: 28 mmol/L (ref 22–32)
Calcium: 8.5 mg/dL — ABNORMAL LOW (ref 8.9–10.3)
Creatinine, Ser: 0.73 mg/dL (ref 0.44–1.00)
GFR calc non Af Amer: >60 mL/min (ref >60)
Glucose, Bld: 113 mg/dL — ABNORMAL HIGH (ref 65–99)
POTASSIUM: 4 mmol/L (ref 3.5–5.1)
Sodium: 136 mmol/L (ref 135–145)

## 2017-01-10 LAB — MAGNESIUM: Magnesium: 2.1 mg/dL (ref 1.7–2.4)

## 2017-01-10 MED ORDER — ASPIRIN EC 81 MG PO TBEC: 81.0000 mg | DELAYED_RELEASE_TABLET | Freq: Every day | ORAL | Status: DC

## 2017-01-10 MED ORDER — OXYCODONE HCL 5 MG PO TABS: 5.0000 mg | ORAL_TABLET | ORAL | 0 refills | Status: DC | PRN

## 2017-01-10 MED ORDER — APIXABAN 5 MG PO TABS: 5.0000 mg | ORAL_TABLET | Freq: Two times a day (BID) | ORAL | Status: DC

## 2017-01-10 MED ORDER — POTASSIUM CHLORIDE CRYS ER 20 MEQ PO TBCR: 40.0000 meq | EXTENDED_RELEASE_TABLET | Freq: Every day | ORAL | 3 refills | Status: DC

## 2017-01-10 MED ORDER — MAGNESIUM OXIDE 400 (241.3 MG) MG PO TABS: 400.0000 mg | ORAL_TABLET | Freq: Two times a day (BID) | ORAL | 0 refills | Status: DC

## 2017-01-10 MED ORDER — ACETAMINOPHEN 325 MG PO TABS: 650.0000 mg | ORAL_TABLET | Freq: Four times a day (QID) | ORAL | Status: DC | PRN

## 2017-01-10 MED ORDER — METOPROLOL TARTRATE 25 MG PO TABS: 25.0000 mg | ORAL_TABLET | Freq: Two times a day (BID) | ORAL | 3 refills | Status: DC

## 2017-01-10 NOTE — Progress Notes (Signed)
Patient is for discharge today, reviewed d/c instructions per cardiac rehab and answered questions of previous material that was covered yesterday.  Referred to phase II cardiac rehab. [phone]

## 2017-01-10 NOTE — Progress Notes (Addendum)
      [address]       [place] [id number]             [phone]      9 Days Post-Op Procedure(s) (LRB): MINIMALLY INVASIVE AORTIC VALVE REPLACEMENT (AVR) (N/A) ASCENDING AORTIC ROOT REPLACEMENT (N/A) TRANSESOPHAGEAL ECHOCARDIOGRAM (TEE) (N/A)   Subjective:  Patient feels good this morning.  She does think she may have slept wrong as she has little bit more pain along her right breast.  + ambulation  + BM  Objective: Vital signs in last 24 hours: Temp:  [97.8 F (36.6 C)-98.2 F (36.8 C)] 98.2 F (36.8 C) (06/16 0511) Pulse Rate:  [61-95] 61 (06/16 0511) Cardiac Rhythm: Normal sinus rhythm;Bundle branch block (06/16 0700) Resp:  [18] 18 (06/16 0511) BP: (111-130)/(48-80) 111/48 (06/16 0511) SpO2:  [93 %-94 %] 93 % (06/16 0511) Weight:  [274 lb 3.2 oz (124.4 kg)] 274 lb 3.2 oz (124.4 kg) (06/16 0406)  Intake/Output from previous day: 06/15 0701 - 06/16 0700 In: 1110 [P.O.:1110] Out: 400 [Urine:400]  General appearance: alert, cooperative and no distress Heart: regular rate and rhythm Lungs: clear to auscultation bilaterally Abdomen: soft, non-tender; bowel sounds normal; no masses,  no organomegaly Extremities: edema trace, improved Wound: clean and dry  Lab Results: No results for input(s): WBC, HGB, HCT, PLT in the last 72 hours. BMET:  Recent Labs  01/08/17 0335 01/10/17 0349  NA 135 136  K 3.4* 4.0  CL 95* 99*  CO2 30 28  GLUCOSE 104* 113*  BUN 10 8  CREATININE 0.63 0.73  CALCIUM 8.5* 8.5*    PT/INR: No results for input(s): LABPROT, INR in the last 72 hours. ABG    Component Value Date/Time   PHART 7.352 01/02/2017 0018   HCO3 22.6 01/02/2017 0018   TCO2 25 01/02/2017 1610   ACIDBASEDEF 3.0 (H) 01/02/2017 0018   O2SAT 90.0 01/02/2017 0018   CBG (last 3)  No results for input(s): GLUCAP in the last 72 hours.  Assessment/Plan: S/P Procedure(s) (LRB): MINIMALLY INVASIVE AORTIC VALVE REPLACEMENT (AVR) (N/A) ASCENDING AORTIC  ROOT REPLACEMENT (N/A) TRANSESOPHAGEAL ECHOCARDIOGRAM (TEE) (N/A)  1. CV- NSR, BP controlled- appreciate Cardiology input.Burnard Leigh. Qtc remains prolonged, but per Cardiology evaluation appears to be chronic... Will restart home Eliquis for chronic DVT 2. Pulm- no acute issues, continue IS 3. Renal- creatinine has been stable, weight continues to improve 4. Hypokalemia-resolved, K up tp 4.0 5. LOC constipation- resolved 6. Dispo- patient stable, home health orders have been arranged, K and Mg are within normal limits, Qtc prolongation is chronic otherwise maintaining NSR, will d/c home today   LOS: 9 days    Cleotha Whalin 01/10/2017

## 2017-01-10 NOTE — Progress Notes (Signed)
Pt discharging home with family.  All instructions and prescriptions given and reviwed, all questions answered.  Home health arranged, and follow up appts in place.

## 2017-01-12 ENCOUNTER — Other Ambulatory Visit: Payer: Self-pay | Admitting: Interventional Cardiology

## 2017-01-13 ENCOUNTER — Telehealth (HOSPITAL_COMMUNITY): Payer: Self-pay

## 2017-01-13 NOTE — Telephone Encounter (Signed)
Patient insurance is active and benefits verified. Patient has [employer] - no copayment, deductible $1080/$691.40 has been met, out of pocket $4388/$0 has been met, no co-insurance, no pre-authorization and no limit on visit. Passport/reference [phone].    Patient will be contacted and scheduled after their follow up appointment with the cardiologist office on 01/26/17 and surgeon on 02/09/17, upon review by [facility] RN navigator.

## 2017-01-16 ENCOUNTER — Other Ambulatory Visit: Payer: Self-pay | Admitting: *Deleted

## 2017-01-16 DIAGNOSIS — G8918 Other acute postprocedural pain: Secondary | ICD-10-CM

## 2017-01-16 MED ORDER — TRAMADOL HCL 50 MG PO TABS: 50.0000 mg | ORAL_TABLET | Freq: Four times a day (QID) | ORAL | 0 refills | Status: DC | PRN

## 2017-01-16 NOTE — Telephone Encounter (Signed)
Ms. Christine Hicks had a MINI AVR/AORTIC ROOT REPLACEMENT on 01/01/17 and was discharged on Oxycodone. She called yesterday for a pain med refill even though she was not completely out. She was just wanting to cover herself for the weekend. Her pain is not at the operative sit, but mostly in her upper back. I told her I would discuss this with Dr. Cornelius Moraswen and call her back today. I did and he wants to now prescribe Tramadol. I relayed this to her, she agreed and a script was faxed to her pharmacy.

## 2017-01-18 DIAGNOSIS — Z48812 Encounter for surgical aftercare following surgery on the circulatory system: Secondary | ICD-10-CM | POA: Diagnosis not present

## 2017-01-25 NOTE — Progress Notes (Signed)
Cardiology Office Note   Date:  01/26/2017   ID:  Christine Hicks, DOB 12/14/1955, MRN [id number]  PCP:  Billee CashingMcKenzie, [name], MD  Cardiologist:  Dr. Katrinka BlazingSmith    Chief Complaint  Patient presents with  . Hospitalization Follow-up      History of Present Illness: Christine Hicks is a 61 y.o. female who presents for post hospital for severe AS, congential bicuspid aortic valve and with AVR with minimally invasive aortic valve replacement with Freestyle porcine, ascending aortic root replacement, and TEE on 01/01/17.  Post op with hypervolemic and diuresed. She also developed prolongation of her QTc interval at greater than 500.  She was taken off Zofran, Metolazone, and protonix to help with this.  Her Lopressor was increased to help with PVCs.  She remained hypervolemic and was treated with Lasix with close monitoring of her QTc level.  She developed marked hypokalemia at 2.7.  She was aggressively supplemented.  Her Qtc was felt to be chronic.  She has hx of DVT and Eliquis was resumed.  she has RBBB.   Discharged on 01/10/17.    Cardiac cath 12/16/16 with normal coronary arteries.  Normal pul. Pressures, PCWP of 14 mmHg,   Today she does have some SOB at times, some due to deconditioning, no cardiac chest pain and incisional pain not severe.  She has Rt scapular pain that she had in the hospital that was due to positioning in the OR.  She also has developed rt lower pain along rib.  Only had for last 2 days.  Last CXR with bil pl. Effusions that had improved. She has not been able to use incentive spirometer as much.     She has been eating well, no constipation since home but did have bright blood in her stools on first day, she had been straining in the hospital.  None since though.  No swelling.  No fevers, mild cough that she had prior to hospital.       She is walking with PT at home.  They are helping rise from sitting position.  Her son is doing the cooking for the family.  She has not been  taking baby ASA and I have asked her to take for now.  She is on Eliquis for DVT.    Past Medical History:  Diagnosis Date  . Acute on chronic diastolic congestive heart failure (HCC)   . Anemia, iron deficiency    negative egd/colonoscopy 11/16/2015  . Aortic stenosis, severe   . Arthritis   . Bicuspid aortic valve   . DVT (deep venous thrombosis) (HCC)    RLE DVT 11/12/15  . GERD (gastroesophageal reflux disease)   . Heart murmur   . Hypertension   . Insomnia   . Morbid obesity (HCC)   . Morbid obesity with BMI of 45.0-49.9, adult (HCC)   . PONV (postoperative nausea and vomiting)    took a long time to wake up  . 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 Thyroid disease   . Wears glasses   . Wears partial dentures     Past Surgical History:  Procedure Laterality Date  . 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 AORTIC VALVE REPLACEMENT N/A 01/01/2017   Procedure: MINIMALLY INVASIVE AORTIC VALVE  REPLACEMENT (AVR);  Surgeon: Purcell Nails, MD;  Location: [facility] OR;  Service: Open Heart Surgery;  Laterality: N/A;  . ASCENDING AORTIC ROOT REPLACEMENT N/A 01/01/2017   Procedure: ASCENDING AORTIC ROOT REPLACEMENT;  Surgeon: Purcell Nails, MD;  Location: [facility] OR;  Service: Open Heart Surgery;  Laterality: N/A;  . CARDIAC CATHETERIZATION     12/16/16  . COLONOSCOPY N/A 11/16/2015   Procedure: COLONOSCOPY;  Surgeon: Iva Boop, MD;  Location[facility] ENDOSCOPY;  Service: Endoscopy;  Laterality: N/A;  . ESOPHAGOGASTRODUODENOSCOPY N/A 11/16/2015   Procedure: ESOPHAGOGASTRODUODENOSCOPY (EGD);  Surgeon: Iva Boop, MD;  Location: [facility] ENDOSCOPY;  Service: Endoscopy;  Laterality: N/A;  . HERNIA REPAIR    . MULTIPLE TOOTH EXTRACTIONS    . Right leg cellulitis  surgery    . TEE WITHOUT CARDIOVERSION N/A 01/01/2017   Procedure: TRANSESOPHAGEAL ECHOCARDIOGRAM (TEE);  Surgeon: Purcell Nails, MD;  Location: [facility] OR;  Service: Open Heart Surgery;  Laterality: N/A;     Current Outpatient Prescriptions  Medication Sig Dispense Refill  . chlorhexidine (PERIDEX) 0.12 % solution Rinse with 15 mls twice daily for 30 seconds. Use after breakfast and at bedtime. Spit out excess. Do not swallow. 480 mL prn  . ELIQUIS 5 MG TABS tablet TAKE 1 TABLET BY MOUTH TWICE A DAY 60 tablet 5  . ferrous sulfate 325 (65 FE) MG tablet Take 1 tablet (325 mg total) by mouth 2 (two) times daily with a meal. 60 tablet 0  . furosemide (LASIX) 40 MG tablet Take 1 tablet (40 mg total) by mouth 2 (two) times daily. 90 tablet 3  . levothyroxine (SYNTHROID, LEVOTHROID) 150 MCG tablet Take 1 tablet (150 mcg total) by mouth daily before breakfast. 30 tablet 2  . metoprolol tartrate (LOPRESSOR) 25 MG tablet Take 1 tablet (25 mg total) by mouth 2 (two) times daily. 60 tablet 3  . pantoprazole (PROTONIX) 40 MG tablet TAKE 1 TABLET (40 MG TOTAL) BY MOUTH DAILY. 30 tablet 6  . potassium chloride SA (K-DUR,KLOR-CON) 20 MEQ tablet Take 2 tablets (40 mEq total) by mouth daily. 60 tablet 3   No current facility-administered medications for this visit.     Allergies:   No known allergies    Social History:  The patient  reports that she has quit smoking. Her smoking use included Cigarettes. She has never used smokeless tobacco. She reports that she does not drink alcohol or use drugs.   Family History:  The patient's family history includes Cardiomyopathy in her son; Emphysema in her father; Heart attack in her mother; Lung cancer in her maternal aunt.    ROS:  General:no colds or fevers, mild wt loss Skin:no rashes or ulcers HEENT:no blurred vision, no congestion CV:see HPI PUL:see HPI GI:no diarrhea, some constipation or melena, no indigestion see HPI GU:no hematuria, no dysuria MS:no  joint pain, no claudication Neuro:no syncope, no lightheadedness Endo:no diabetes, + thyroid disease  Wt Readings from Last 3 Encounters:  01/26/17 271 lb 1.9 oz (123 kg)  01/10/17 274 lb 3.2 oz (124.4 kg)  12/30/16 275 lb (124.7 kg)     PHYSICAL EXAM: VS:  BP 128/84   Pulse 70   Ht 5\' 5"  (1.651 m)   Wt 271 lb 1.9 oz (123 kg)   LMP  (LMP Unknown)   BMI 45.12 kg/m  , BMI Body mass index is 45.12 kg/m. General:Pleasant affect, NAD Skin:Warm and dry, brisk capillary refill HEENT:normocephalic, sclera clear, mucus membranes moist Neck:supple, no JVD, no bruits  Heart:S1S2 RRR without murmur, gallup, rub or click Chest wall: midsternal mini incision is healing without redness. Pacer site with steri-stips mild serous drainage.  Rib cage, pain to palpation on rt lateral area Lungs:diminished in base,  without rales, rhonchi, or wheezes ZOX:WRUEA, soft, non tender, + BS, do not palpate liver spleen or masses Ext:no lower ext edema, 2+ pedal pulses, 2+ radial pulses Neuro:alert and oriented X 3, MAE, follows commands, + facial symmetry    EKG:  EKG is ordered today. The ekg ordered today demonstrates SR with RBBB, QTc of 478 improved from hospital.     Recent Labs: 12/11/2016: B Natriuretic Peptide 81.6 12/30/2016: ALT 23 01/03/2017: Hemoglobin 12.1; Platelets 137 01/08/2017: TSH 3.180 01/10/2017: BUN 8; Creatinine, Ser 0.73; Magnesium 2.1; Potassium 4.0; Sodium 136    Lipid Panel    Component Value Date/Time   CHOL  09/11/2009 0555    124        ATP III CLASSIFICATION:  <200     mg/dL   Desirable  540-981  mg/dL   Borderline High  >=191    mg/dL   High          TRIG 478 (H) 09/11/2009 0555   HDL 14 (L) 09/11/2009 0555   CHOLHDL 8.9 09/11/2009 0555   VLDL 40 09/11/2009 0555   LDLCALC  09/11/2009 0555    70        Total Cholesterol/HDL:CHD Risk Coronary Heart Disease Risk Table                     Men   Women  1/2 Average Risk   3.4   3.3  Average Risk       5.0    4.4  2 X Average Risk   9.6   7.1  3 X Average Risk  23.4   11.0        Use the calculated Patient Ratio above and the CHD Risk Table to determine the patient's CHD Risk.        ATP III CLASSIFICATION (LDL):  <100     mg/dL   Optimal  295-621  mg/dL   Near or Above                    Optimal  130-159  mg/dL   Borderline  308-657  mg/dL   High  >846     mg/dL   Very High       Other studies Reviewed: Additional studies/ records that were reviewed today include: . 01/01/17 TEE: intra op.  Result status: Final result   Left ventricle: Normal cavity size, left ventricular diastolic function and left atrial pressure. Concentric hypertrophy. LV systolic function is low normal with an EF of 50-55%. There are no obvious wall motion abnormalities. No thrombus present. No mass present.  Aortic valve: The valve is bicuspid. Moderate valve thickening present. Moderate valve calcification present. Mildly decreased leaflet separation. Critical stenosis. Mild regurgitation.  Right ventricle: Normal wall thickness. Cavity is mildly dilated. Mildly reduced systolic function.  Tricuspid valve: Valve has a dilated annulus. Mild to moderate regurgitation.  Pulmonic valve: Trace regurgitation.  Aorta: Graft present in the ascending aorta.      OP Note 01/01/17 PRE-OPERATIVE DIAGNOSIS:  AS  POST-OPERATIVE DIAGNOSIS:  AS  PROCEDURE:  Procedure(s): MINIMALLY INVASIVE AORTIC VALVE REPLACEMENT (AVR) (N/A) ASCENDING AORTIC ROOT REPLACEMENT (N/A) TRANSESOPHAGEAL ECHOCARDIOGRAM (TEE) (N/A)  ASSESSMENT AND PLAN:  1.critical AS now  POST op after AVR with Medtronic  freestyle porcine aortic root graft size 21 mm model#995, serial #[id number], Mini Aortic Valve Replacement via Mini sternotomy, incision healing.  To follow up with Dr. Cornelius Moras no fevers.  2. Rt lateral to post chest wall pain will check CXR, some SOB as well.  3. Hx DVT on Eliquis - discharged on ASA 81 but has not been taking I did  ask her to take for now.  4. Anemia post op will check CBC today.    5. Diastolic CHF/volume overload, on lasix will recheck BMP need to check Magnesium also.  6.   HTN stable.   7. Morbid obesity, PT is helping her to ambulate, once she has recovered from surgery will need to encourage to lose wt.    Current medicines are reviewed with the patient today.  The patient Has no concerns regarding medicines.  The following changes have been made:  See above Labs/ tests ordered today include:see above  Disposition:   FU:  see above  Signed, Nada Boozer, NP  01/26/2017 10:42 AM    [facility] [address] [place], [place], [place]  [address]/ [address] [place], [place] Phone: [phone]; Fax: [phone]  [phone]

## 2017-01-26 ENCOUNTER — Encounter: Payer: Self-pay | Admitting: Cardiology

## 2017-01-26 ENCOUNTER — Telehealth: Payer: Self-pay | Admitting: *Deleted

## 2017-01-26 ENCOUNTER — Ambulatory Visit (INDEPENDENT_AMBULATORY_CARE_PROVIDER_SITE_OTHER)
Admission: RE | Admit: 2017-01-26 | Discharge: 2017-01-26 | Disposition: A | Discharge status: Home / Self Care | Payer: BC Managed Care – PPO | Source: Ambulatory Visit | Attending: Cardiology | Admitting: Cardiology

## 2017-01-26 ENCOUNTER — Ambulatory Visit (INDEPENDENT_AMBULATORY_CARE_PROVIDER_SITE_OTHER): Payer: BC Managed Care – PPO | Admitting: Cardiology

## 2017-01-26 VITALS — BP 128/84 | HR 70 | Ht 65.0 in | Wt 271.1 lb

## 2017-01-26 DIAGNOSIS — D6489 Other specified anemias: Secondary | ICD-10-CM | POA: Diagnosis not present

## 2017-01-26 DIAGNOSIS — I82409 Acute embolism and thrombosis of unspecified deep veins of unspecified lower extremity: Secondary | ICD-10-CM

## 2017-01-26 DIAGNOSIS — I82509 Chronic embolism and thrombosis of unspecified deep veins of unspecified lower extremity: Secondary | ICD-10-CM

## 2017-01-26 DIAGNOSIS — E876 Hypokalemia: Secondary | ICD-10-CM

## 2017-01-26 DIAGNOSIS — Z954 Presence of other heart-valve replacement: Secondary | ICD-10-CM | POA: Diagnosis not present

## 2017-01-26 DIAGNOSIS — I10 Essential (primary) hypertension: Secondary | ICD-10-CM | POA: Diagnosis not present

## 2017-01-26 DIAGNOSIS — I350 Nonrheumatic aortic (valve) stenosis: Secondary | ICD-10-CM | POA: Diagnosis not present

## 2017-01-26 DIAGNOSIS — M545 Low back pain: Secondary | ICD-10-CM

## 2017-01-26 DIAGNOSIS — R0602 Shortness of breath: Secondary | ICD-10-CM

## 2017-01-26 DIAGNOSIS — O2230 Deep phlebothrombosis in pregnancy, unspecified trimester: Secondary | ICD-10-CM

## 2017-01-26 MED ORDER — FUROSEMIDE 40 MG PO TABS: ORAL_TABLET | ORAL | 5 refills | Status: DC

## 2017-01-26 NOTE — Telephone Encounter (Signed)
-----   Message from Leone BrandLaura R Ingold, NP sent at 01/26/2017 12:39 PM EDT ----- Let pt know her CXR shows some fluid so for 2 days take an extra lasix in the morning ie 80 in am and 40 in pm - but for 2 days only then go back to 40 BID.  I will let her know about extra K+ once labs return.

## 2017-01-26 NOTE — Patient Instructions (Signed)
Medication Instructions:  Your physician recommends that you continue on your current medications as directed. Please refer to the Current Medication list given to you today.   Labwork: Your physician recommends that you return for lab work today for BMET, CBC  Testing/Procedures: A chest x-ray takes a picture of the organs and structures inside the chest, including the heart, lungs, and blood vessels. This test can show several things, including, whether the heart is enlarges; whether fluid is building up in the lungs; and whether pacemaker / defibrillator leads are still in place.   Follow-Up: Your physician recommends that you schedule a follow-up appointment in: 4-6 weeks with Dr. Katrinka BlazingSmith   Any Other Special Instructions Will Be Listed Below (If Applicable).     If you need a refill on your cardiac medications before your next appointment, please call your pharmacy.

## 2017-01-27 ENCOUNTER — Other Ambulatory Visit: Payer: Self-pay | Admitting: Thoracic Surgery (Cardiothoracic Vascular Surgery)

## 2017-01-27 LAB — CBC
HEMATOCRIT: 40.9 % (ref 34.0–46.6)
Hemoglobin: 12.9 g/dL (ref 11.1–15.9)
MCH: 31.1 pg (ref 26.6–33.0)
MCHC: 31.5 g/dL (ref 31.5–35.7)
MCV: 99 fL — AB (ref 79–97)
Platelets: 252 10*3/uL (ref 150–379)
RBC: 4.15 x10E6/uL (ref 3.77–5.28)
RDW: 14.9 % (ref 12.3–15.4)
WBC: 7.6 10*3/uL (ref 3.4–10.8)

## 2017-01-27 LAB — BASIC METABOLIC PANEL
BUN / CREAT RATIO: 12 (ref 12–28)
BUN: 9 mg/dL (ref 8–27)
CO2: 22 mmol/L (ref 20–29)
CREATININE: 0.75 mg/dL (ref 0.57–1.00)
Calcium: 9 mg/dL (ref 8.7–10.3)
Chloride: 103 mmol/L (ref 96–106)
GFR, EST AFRICAN AMERICAN: 99 mL/min/{1.73_m2} (ref >59)
GFR, EST NON AFRICAN AMERICAN: 86 mL/min/{1.73_m2} (ref >59)
Glucose: 135 mg/dL — ABNORMAL HIGH (ref 65–99)
Potassium: 4 mmol/L (ref 3.5–5.2)
Sodium: 141 mmol/L (ref 134–144)

## 2017-01-29 LAB — VAS US DOPPLER PRE CABG
LCCAPSYS: 85 cm/s
LEFT ECA DIAS: -6 cm/s
LEFT VERTEBRAL DIAS: -9 cm/s
LICADDIAS: -19 cm/s
LICAPSYS: -52 cm/s
Left CCA dist dias: -18 cm/s
Left CCA dist sys: -71 cm/s
Left CCA prox dias: 14 cm/s
Left ICA dist sys: -55 cm/s
Left ICA prox dias: -17 cm/s
RIGHT ECA DIAS: -6 cm/s
RIGHT VERTEBRAL DIAS: -10 cm/s
Right CCA prox dias: -6 cm/s
Right CCA prox sys: -65 cm/s
Right cca dist sys: -63 cm/s

## 2017-02-06 ENCOUNTER — Other Ambulatory Visit: Payer: Self-pay | Admitting: Thoracic Surgery (Cardiothoracic Vascular Surgery)

## 2017-02-06 DIAGNOSIS — I350 Nonrheumatic aortic (valve) stenosis: Secondary | ICD-10-CM

## 2017-02-09 ENCOUNTER — Ambulatory Visit
Admission: RE | Admit: 2017-02-09 | Discharge: 2017-02-09 | Disposition: A | Discharge status: Home / Self Care | Payer: BC Managed Care – PPO | Source: Ambulatory Visit | Attending: Thoracic Surgery (Cardiothoracic Vascular Surgery) | Admitting: Thoracic Surgery (Cardiothoracic Vascular Surgery)

## 2017-02-09 ENCOUNTER — Encounter: Payer: Self-pay | Admitting: Thoracic Surgery (Cardiothoracic Vascular Surgery)

## 2017-02-09 ENCOUNTER — Ambulatory Visit (INDEPENDENT_AMBULATORY_CARE_PROVIDER_SITE_OTHER): Payer: Self-pay | Admitting: Thoracic Surgery (Cardiothoracic Vascular Surgery)

## 2017-02-09 VITALS — BP 134/80 | HR 70 | Resp 20 | Ht 65.0 in | Wt 264.0 lb

## 2017-02-09 DIAGNOSIS — I350 Nonrheumatic aortic (valve) stenosis: Secondary | ICD-10-CM

## 2017-02-09 DIAGNOSIS — Q230 Congenital stenosis of aortic valve: Secondary | ICD-10-CM

## 2017-02-09 DIAGNOSIS — Z954 Presence of other heart-valve replacement: Secondary | ICD-10-CM

## 2017-02-09 DIAGNOSIS — Q231 Congenital insufficiency of aortic valve: Secondary | ICD-10-CM

## 2017-02-09 DIAGNOSIS — Z952 Presence of prosthetic heart valve: Secondary | ICD-10-CM

## 2017-02-09 NOTE — Patient Instructions (Signed)
Continue to avoid any heavy lifting or strenuous use of your arms or shoulders for at least a total of three months from the time of surgery.  After three months you may gradually increase how much you lift or otherwise use your arms or chest as tolerated, with limits based upon whether or not activities lead to the return of significant discomfort.  Continue all previous medications without any changes at this time  You are encouraged to enroll and participate in the outpatient cardiac rehab program beginning as soon as practical.  You may return to driving an automobile as long as you are no longer requiring oral narcotic pain relievers during the daytime.  It would be wise to start driving only short distances during the daylight and gradually increase from there as you feel comfortable.  Endocarditis is a potentially serious infection of heart valves or inside lining of the heart.  It occurs more commonly in patients with diseased heart valves (such as patient's with aortic or mitral valve disease) and in patients who have undergone heart valve repair or replacement.  Certain surgical and dental procedures may put you at risk, such as dental cleaning, other dental procedures, or any surgery involving the respiratory, urinary, gastrointestinal tract, gallbladder or prostate gland.   To minimize your chances for develooping endocarditis, maintain good oral health and seek prompt medical attention for any infections involving the mouth, teeth, gums, skin or urinary tract.    Always notify your doctor or dentist about your underlying heart valve condition before having any invasive procedures. You will need to take antibiotics before certain procedures, including all routine dental cleanings or other dental procedures.  Your cardiologist or dentist should prescribe these antibiotics for you to be taken ahead of time.       

## 2017-02-09 NOTE — Progress Notes (Signed)
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  Her early postoperative recovery in the hospital was essentially uncomplicated although relatively slow because of her morbid obesity, limited mobility, and chronic medical problems. She eventually was discharged home on the ninth postoperative day with home health physical therapy.  Since then she has been seen in follow-up at [facility] by Nada Boozer on 01/26/2017. Her dose of Lasix was increased transiently at that time because of fluid overload. She returns to our office for routine follow-up today and reports that overall she is doing well and slowly making progress. She is accompanied by her husband and son for her office visit today. She has mild residual soreness in her chest but she has not required any sort of pain medication several weeks.  She states that her breathing continues to gradually improve. She has been working with home health physical therapy and making some progress but she states that she still tires easily. Overall she is pleased with her progress. Weight has been stable for the past 2 weeks. Lower extremity edema is reportedly improved.   Current Outpatient Prescriptions  Medication Sig Dispense Refill  . aspirin EC 81 MG  tablet Take 81 mg by mouth daily.    . chlorhexidine (PERIDEX) 0.12 % solution Rinse with 15 mls twice daily for 30 seconds. Use after breakfast and at bedtime. Spit out excess. Do not swallow. 480 mL prn  . ELIQUIS 5 MG TABS tablet TAKE 1 TABLET BY MOUTH TWICE A DAY 60 tablet 5  . ferrous sulfate 325 (65 FE) MG tablet Take 1 tablet (325 mg total) by mouth 2 (two) times daily with a meal. 60 tablet 0  . furosemide (LASIX) 40 MG tablet Take 2 tablets by mouth in the a.m. And 1 tablet in the p m for 2 days then go back to 1 tablet by mouth twice a day 62 tablet 5  . levothyroxine (SYNTHROID, LEVOTHROID) 150 MCG tablet Take 1 tablet (150 mcg total) by mouth daily before breakfast. 30 tablet 2  . metoprolol tartrate (LOPRESSOR) 25 MG tablet Take 1 tablet (25 mg total) by mouth 2 (two) times daily. 60 tablet 3  . pantoprazole (PROTONIX) 40 MG tablet TAKE 1 TABLET (40 MG TOTAL) BY MOUTH DAILY. 30 tablet 6  . potassium chloride SA (K-DUR,KLOR-CON) 20 MEQ tablet Take 2 tablets (40 mEq total) by mouth daily. 60 tablet 3  . traMADol (ULTRAM) 50 MG tablet TAKE 1 TO 2 TABLETS EVERY 6 HOURS AS NEEDED 30 tablet 0   No current facility-administered medications for this visit.       Physical Exam:   BP 134/80   Pulse 70   Resp 20   Ht 5\' 5"  (1.651 m)   Wt 264 lb (119.7 kg)   LMP  (LMP  Unknown)   SpO2 97% Comment: RA  BMI 43.93 kg/m   General:  Morbidly obese but well appearing  Chest:   Clear to auscultation with slightly diminished breath sounds at lung bases  CV:   Regular rate and rhythm without murmur  Incisions:  Clean and dry healing nicely, sternum is stable  Abdomen:  Soft nontender  Extremities:  Warm and well-perfused with lower extremity edema at the ankle  Diagnostic Tests:  CHEST  2 VIEW  COMPARISON:  01/26/2017 chest x-ray.  FINDINGS: Post aortic valve replacement per history.  Cardiomegaly.  Right-sided pleural effusion, right base atelectasis and central pulmonary  vascular prominence similar to prior exam.  Calcified aorta.  Mild degenerative changes thoracic spine.  IMPRESSION: Overall no significant change.  Post aortic valve replacement.  Cardiomegaly.  Right-sided pleural effusion, right base atelectasis and central pulmonary vascular prominence similar to prior exam.  Aortic atherosclerosis.   Electronically Signed   By: Lacy DuverneySteven  Olson M.D.   On: 02/09/2017 13:54    Impression:  Patient is doing well more than 1 month status post aortic root replacement using a stentless porcine aortic root graft via partial upper mini sternotomy for severe symptomatic aortic stenosis with small sized aortic root.  Plan:  We have not recommended any changes to the patient's current medications. I've encouraged the patient to continue to gradually increase her physical activity as tolerated but to refrain from any sort of heavy lifting or strenuous use of her arms or shoulders for at least another 2 months. When she makes adequate progress with home health physical therapy I've encouraged patient to enroll in outpatient cardiac rehabilitation program.  The patient has been reminded regarding the importance of dental hygiene and the lifelong need for antibiotic prophylaxis for all dental cleanings and other related invasive procedures.  The patient will return to our office in approximately 2 months with repeat chest x-ray to make sure that the small residual right pleural effusion has resolved. During the interim period time the patient will call and return to see us sooner should specific problems or questions arise. All of her questions have been addressed.    Salvatore Decentlarence H. Cornelius Moraswen, MD 02/09/2017 2:16 PM

## 2017-03-03 IMAGING — CT CT ANGIO CHEST
1 of 8 series · 17 of 36 positions shown · IV contrast (Iodine)
Comparison: None.

CLINICAL DATA: Dyspnea, onset 1 week ago. Productive cough for 3
weeks. Bilateral lower extremity swelling, worsened over the past 2
days.

EXAM:
CT ANGIOGRAPHY CHEST WITH CONTRAST
TECHNIQUE: Multidetector CT imaging of the chest was performed using the
standard protocol during bolus administration of intravenous
contrast. Multiplanar CT image reconstructions and MIPs were
obtained to evaluate the vascular anatomy.
CONTRAST:  80 mL Isovue 370 intravenous

[Series 507: thins pacs · axial · 0.64mm/px · z∈[+134,+389]mm · 17 of 287 slices shown]
[im 16/287  lung]
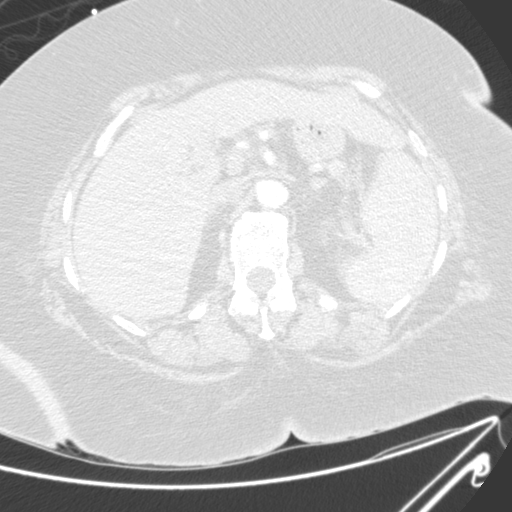
[im 32/287  mediastinal]
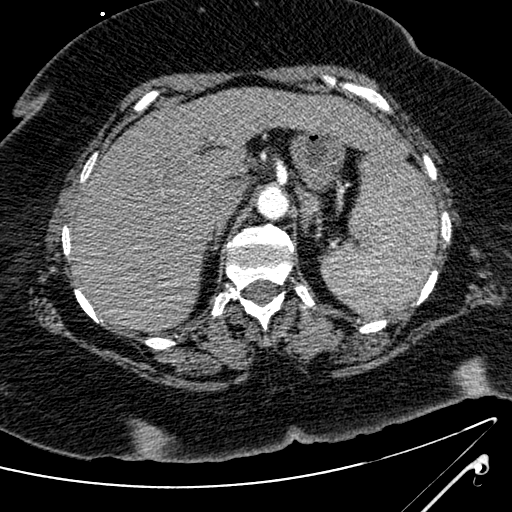
[im 48/287  lung]
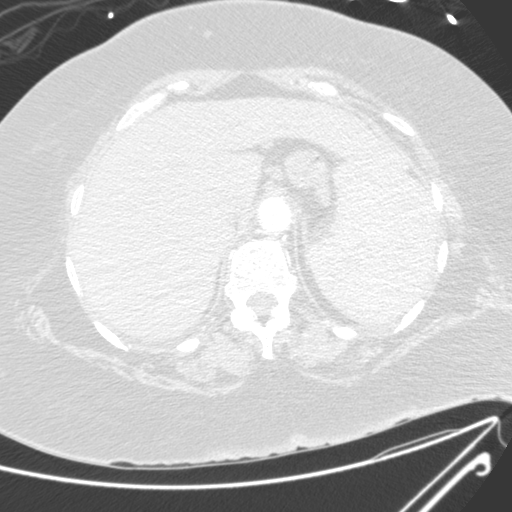
[im 64/287  mediastinal]
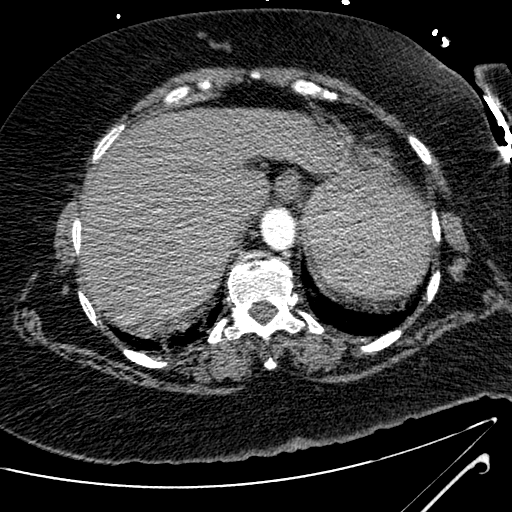
[im 80/287  lung]
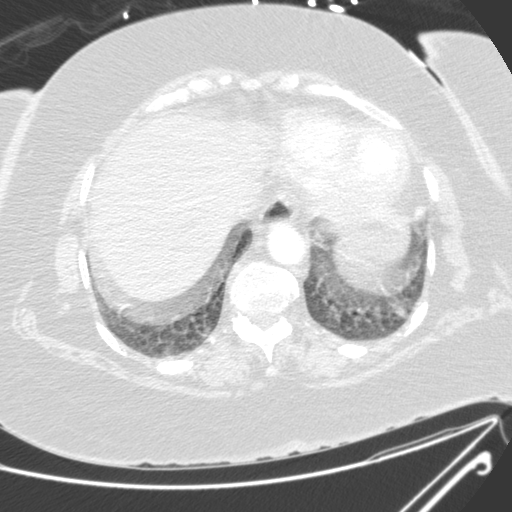
[im 96/287  mediastinal]
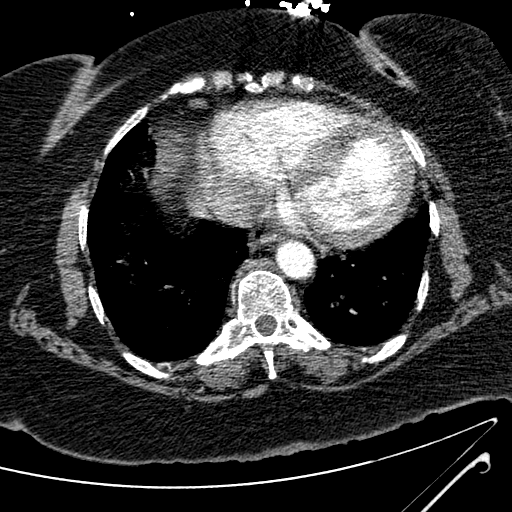
[im 112/287  lung]
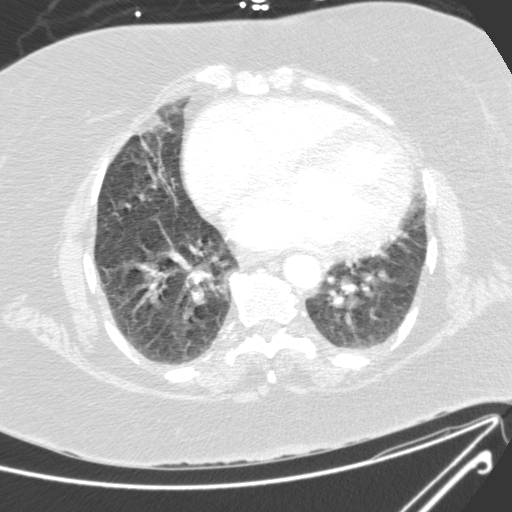
[im 128/287  mediastinal]
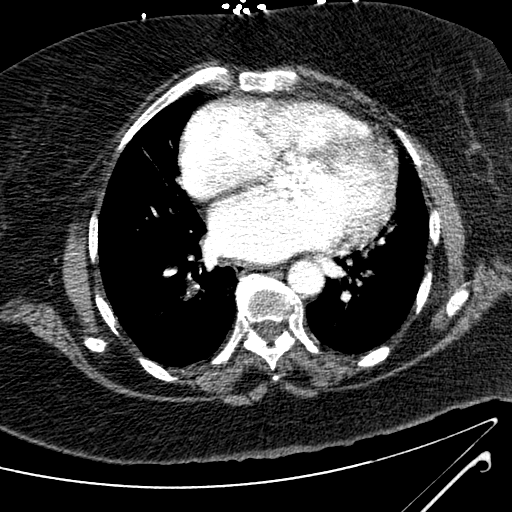
[im 144/287  lung]
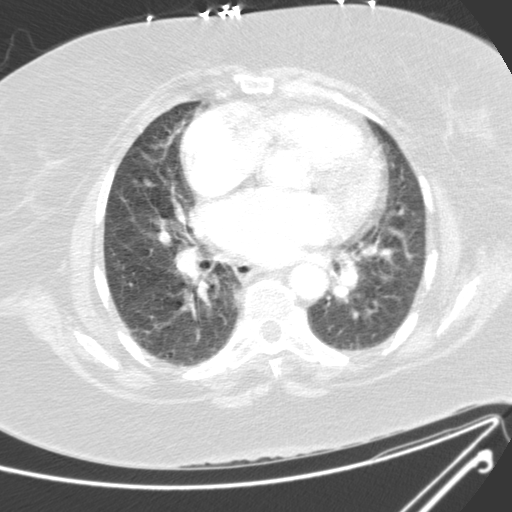
[im 159/287  mediastinal]
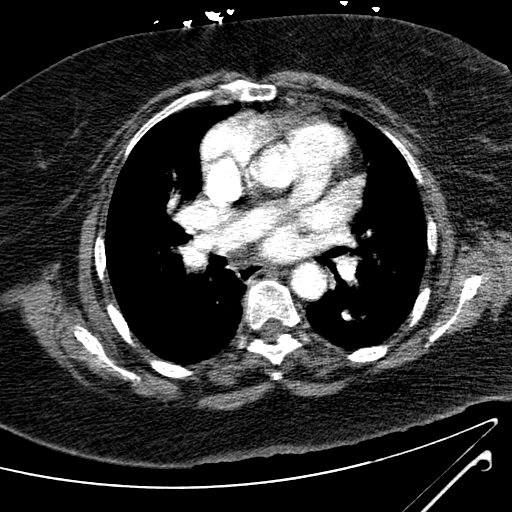
[im 175/287  lung]
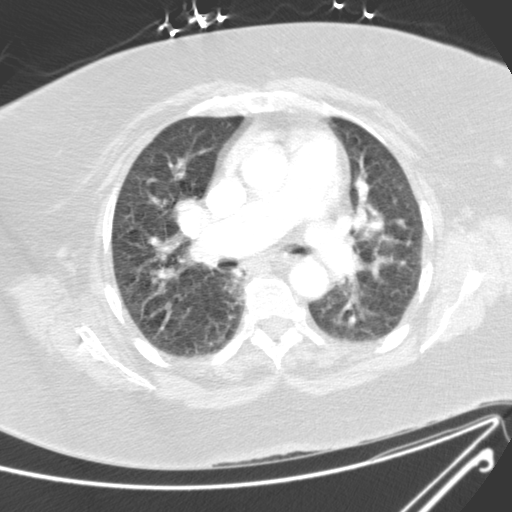
[im 191/287  mediastinal]
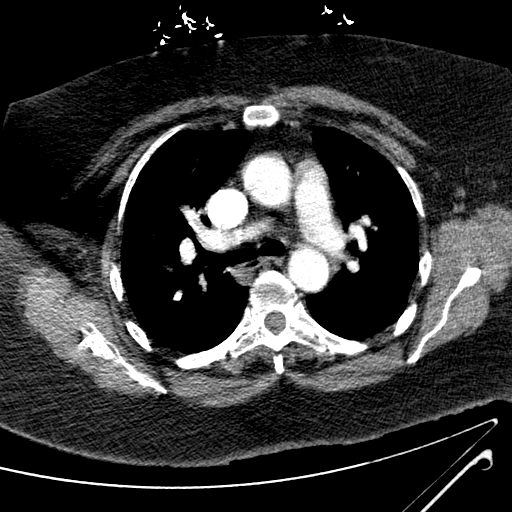
[im 207/287  lung]
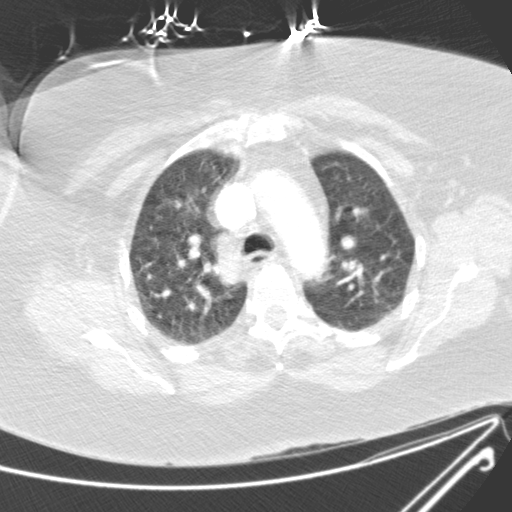
[im 223/287  mediastinal]
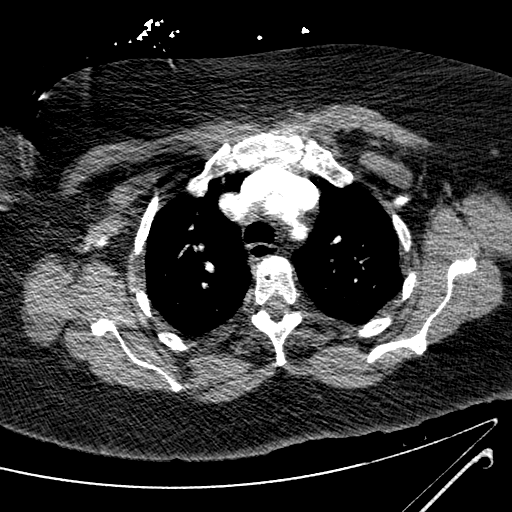
[im 239/287  lung]
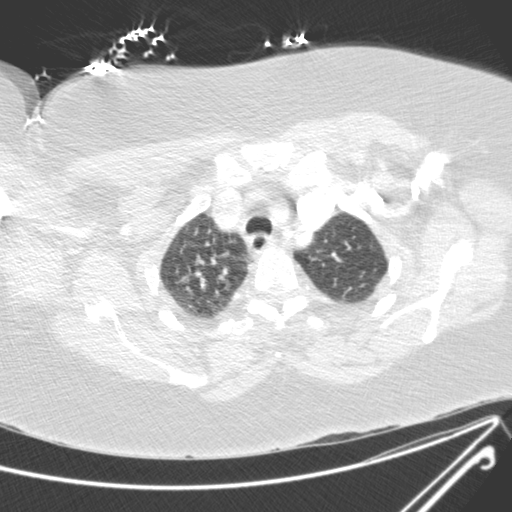
[im 255/287  mediastinal]
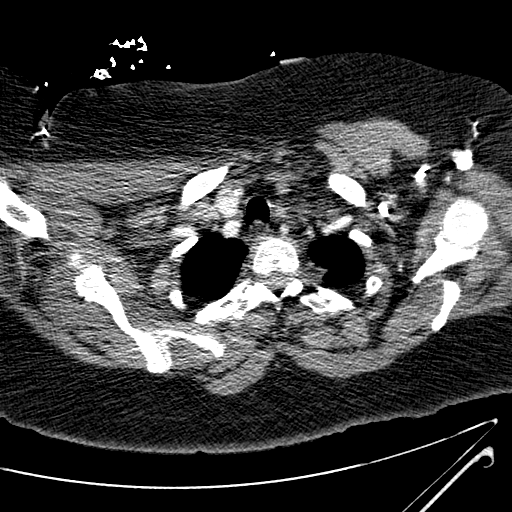
[im 271/287  lung]
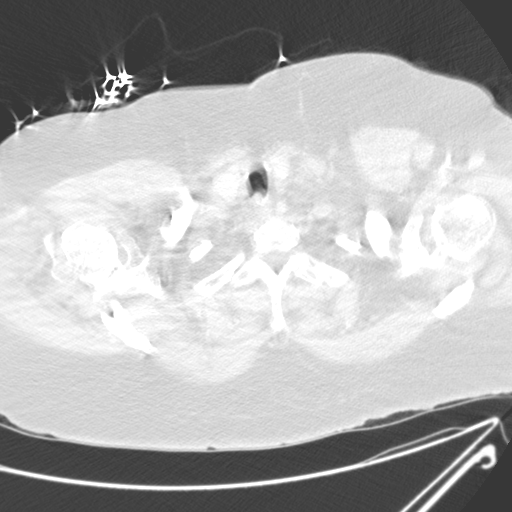

[17 of 36 positions shown; findings below may reference images not displayed]

FINDINGS: Cardiovascular: There is good opacification of the pulmonary
arteries. There is no pulmonary embolism. The thoracic aorta is
normal in caliber and intact. There is enlargement of all cardiac
chambers.

Lungs: Mild linear basilar scarring or atelectasis. Otherwise clear.

Central airways: Patent

Effusions: None

Lymphadenopathy: None

Esophagus: Unremarkable

Upper abdomen: Small hiatal hernia

Musculoskeletal: No significant skeletal lesion.

Review of the MIP images confirms the above findings.

Mild motion degradation of the images.
IMPRESSION: Negative for acute pulmonary embolism. There is a small hiatal
hernia. There is enlargement of all cardiac chambers. Mild motion
degradation of the images.

## 2017-03-11 NOTE — Progress Notes (Signed)
Cardiology Office Note    Date:  03/12/2017   ID:  Christine Hicks, DOB 02-06-1956, MRN [id number]  PCP:  Billee Cashing, MD  Cardiologist: Lesleigh Noe, MD   Chief Complaint  Patient presents with  . Cardiac Valve Problem    History of Present Illness:  Christine Hicks is a 61 y.o. 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  Patient is doing relatively well. She denies dyspnea. She has no orthopnea. Chronic right greater than left lower extremity swelling. Receiving home physical therapy and is able to ambulate at home but has significant reduction in endurance. Able to care for her on personal hygiene needs.  She does note incisional skin lesion that recurred after it there was complete healing. She has no chills or fever. She denies cough. There is no sternal pain and she denies "clicking and popping with movement and breathing".   Past Medical History:  Diagnosis Date  . Acute on chronic diastolic congestive heart failure (HCC)   . Anemia, iron deficiency    negative egd/colonoscopy 11/16/2015  . Aortic stenosis, severe   . Arthritis   . Bicuspid aortic valve   . DVT (deep venous thrombosis) (HCC)    RLE DVT 11/12/15  . GERD (gastroesophageal reflux disease)   . Heart murmur   . Hypertension   . Insomnia   . Morbid obesity (HCC)   . Morbid obesity with BMI of 45.0-49.9, adult (HCC)   . PONV (postoperative nausea and vomiting)    took a long time to wake up  . 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 Thyroid disease   . Wears glasses   . Wears partial  dentures     Past Surgical History:  Procedure Laterality Date  . 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 AORTIC VALVE REPLACEMENT N/A 01/01/2017   Procedure: MINIMALLY INVASIVE AORTIC VALVE REPLACEMENT (AVR);  Surgeon: Purcell Nails, MD;  Location: [facility];  Service: Open Heart Surgery;  Laterality: N/A;  . ASCENDING AORTIC ROOT REPLACEMENT N/A 01/01/2017   Procedure: ASCENDING AORTIC ROOT REPLACEMENT;  Surgeon: Purcell Nails, MD;  Location: [facility] OR;  Service: Open Heart Surgery;  Laterality: N/A;  . CARDIAC CATHETERIZATION     12/16/16  . COLONOSCOPY N/A 11/16/2015   Procedure: COLONOSCOPY;  Surgeon: Iva Boop, MD;  Location: [facility] ENDOSCOPY;  Service: Endoscopy;  Laterality: N/A;  . ESOPHAGOGASTRODUODENOSCOPY N/A 11/16/2015   Procedure: ESOPHAGOGASTRODUODENOSCOPY (EGD);  Surgeon: Iva Boop, MD;  Location: [facility] ENDOSCOPY;  Service: Endoscopy;  Laterality: N/A;  . HERNIA REPAIR    . MULTIPLE TOOTH EXTRACTIONS    . Right leg cellulitis surgery    . TEE WITHOUT CARDIOVERSION N/A 01/01/2017   Procedure: TRANSESOPHAGEAL ECHOCARDIOGRAM (TEE);  Surgeon: Purcell Nails, MD;  Location: [facility] OR;  Service: Open Heart Surgery;  Laterality: N/A;    Current Medications: Outpatient Medications Prior to Visit  Medication Sig Dispense Refill  . aspirin EC 81 MG tablet Take 81 mg by mouth daily.    . chlorhexidine (PERIDEX) 0.12 % solution Rinse with 15 mls twice daily for 30 seconds.  Use after breakfast and at bedtime. Spit out excess. Do not swallow. 480 mL prn  . ELIQUIS 5 MG TABS tablet TAKE 1 TABLET BY MOUTH TWICE A DAY 60 tablet 5  . ferrous sulfate 325 (65 FE) MG tablet Take 1 tablet (325 mg total) by mouth 2 (two) times daily with a meal. 60 tablet 0  . furosemide (LASIX) 40 MG tablet Take 2 tablets by mouth in the a.m. And 1 tablet in the p m for 2 days then go back to 1 tablet by mouth twice a day 62 tablet 5  . levothyroxine (SYNTHROID, LEVOTHROID) 150 MCG  tablet Take 1 tablet (150 mcg total) by mouth daily before breakfast. 30 tablet 2  . metoprolol tartrate (LOPRESSOR) 25 MG tablet Take 1 tablet (25 mg total) by mouth 2 (two) times daily. 60 tablet 3  . pantoprazole (PROTONIX) 40 MG tablet TAKE 1 TABLET (40 MG TOTAL) BY MOUTH DAILY. 30 tablet 6  . potassium chloride SA (K-DUR,KLOR-CON) 20 MEQ tablet Take 2 tablets (40 mEq total) by mouth daily. 60 tablet 3  . traMADol (ULTRAM) 50 MG tablet TAKE 1 TO 2 TABLETS EVERY 6 HOURS AS NEEDED 30 tablet 0   No facility-administered medications prior to visit.      Allergies:   No known allergies   Social History   Social History  . Marital status: Significant Other    Spouse name: N/A  . Number of children: N/A  . Years of education: N/A   Social History Main Topics  . Smoking status: Former Smoker    Types: Cigarettes  . Smokeless tobacco: Never Used     Comment: quit smoking cigarettes > 25 years ago  . Alcohol use No  . Drug use: No  . Sexual activity: Yes   Other Topics Concern  . None   Social History Narrative  . None     Family History:  The patient's family history includes Cardiomyopathy in her son; Emphysema in her father; Heart attack in her mother; Lung cancer in her maternal aunt.   ROS:   Please see the history of present illness.    Appetite is stable. She wonders if lower extremity swallowing get better now that she has had surgery on the valve. Much of her lower extremity edema is postphlebitic and DVT related.  All other systems reviewed and are negative.   PHYSICAL EXAM:   VS:  BP 116/74 (BP Location: Right Arm)   Pulse 98   Ht 5\' 5"  (1.651 m)   Wt 268 lb 6.4 oz (121.7 kg)   LMP  (LMP Unknown)   BMI 44.66 kg/m   MORBIDLY OBESE sitting in a wheelchair GEN: Well developed, in no acute distress  HEENT: normal  Neck: no JVD, carotid bruits, or masses Chest: In the mid sternal incision, there is an eschar related to a skin lesion that developed after healing  from surgery. She states that it was previously " open " and now has complete eschar. No drainage, fluctuance, or soreness is noted. Cardiac: RRR; no murmurs, rubs, or gallops. There is right greater than left ankle edema . Respiratory:  clear to auscultation bilaterally, normal work of breathing GI: soft, nontender, nondistended, + BS MS: no deformity or atrophy  Skin: warm and dry, no rash Neuro:  Alert and Oriented x 3, Strength and sensation are intact Psych: euthymic mood, full affect  Wt Readings from Last 3 Encounters:  03/12/17 268 lb 6.4 oz (121.7 kg)  02/09/17 264  lb (119.7 kg)  01/26/17 271 lb 1.9 oz (123 kg)      Studies/Labs Reviewed:   EKG:  EKG  Not repeated  Recent Labs: 12/11/2016: B Natriuretic Peptide 81.6 12/30/2016: ALT 23 01/08/2017: TSH 3.180 01/10/2017: Magnesium 2.1 01/26/2017: BUN 9; Creatinine, Ser 0.75; Hemoglobin 12.9; Platelets 252; Potassium 4.0; Sodium 141   Lipid Panel    Component Value Date/Time   CHOL  09/11/2009 0555    124        ATP III CLASSIFICATION:  <200     mg/dL   Desirable  161-096  mg/dL   Borderline High  >=045    mg/dL   High          TRIG 409 (H) 09/11/2009 0555   HDL 14 (L) 09/11/2009 0555   CHOLHDL 8.9 09/11/2009 0555   VLDL 40 09/11/2009 0555   LDLCALC  09/11/2009 0555    70        Total Cholesterol/HDL:CHD Risk Coronary Heart Disease Risk Table                     Men   Women  1/2 Average Risk   3.4   3.3  Average Risk       5.0   4.4  2 X Average Risk   9.6   7.1  3 X Average Risk  23.4   11.0        Use the calculated Patient Ratio above and the CHD Risk Table to determine the patient's CHD Risk.        ATP III CLASSIFICATION (LDL):  <100     mg/dL   Optimal  811-914  mg/dL   Near or Above                    Optimal  130-159  mg/dL   Borderline  782-956  mg/dL   High  >213     mg/dL   Very High    Additional studies/ records that were reviewed today include:  None    ASSESSMENT:    1. S/P partial  sternotomy for aortic root replacement with stentless porcine aortic root graft    2. Essential hypertension   3. Acute on chronic diastolic heart failure (HCC)   4. Morbid obesity (HCC)   5. DVT, recurrent, lower extremity, chronic, bilateral (HCC)      PLAN:  In order of problems listed above:  1. By auscultation, valve function is normal. Diastolic heart failure symptoms have completely resolved. To this point she has had a dramatic improvement. 2. There is a small eschar in the mid region of the sternotomy scar. Unsure why this would recur after sternal healing. We'll report this to Dr. Cornelius Moras. 3. Heart failure has resolved. 4. Not addressed other than encouraged to enroll in cardiac rehabilitation. 5. Continue chronic anticoagulation therapy.  Clinical follow-up 3-4 months. Earlier if clinical problems. Report the sternal skin lesion to Dr. Cornelius Moras.    Medication Adjustments/Labs and Tests Ordered: Current medicines are reviewed at length with the patient today.  Concerns regarding medicines are outlined above.  Medication changes, Labs and Tests ordered today are listed in the Patient Instructions below. Patient Instructions  Medication Instructions:  None  Labwork: None  Testing/Procedures: None  Follow-Up: Your physician recommends that you schedule a follow-up appointment in: 4-6 months with Dr. Katrinka Blazing.  You have been referred to Phase 2 Cardiac Rehab.    Any Other Special Instructions Will Be Listed Below (  If Applicable).     If you need a refill on your cardiac medications before your next appointment, please call your pharmacy.      Signed, Lesleigh NoeHenry W Frederic Tones III, MD  03/12/2017 12:44 PM    [facility] [address], [place], [place]  [address] Phone: [phone]; Fax: [phone]

## 2017-03-12 ENCOUNTER — Encounter (INDEPENDENT_AMBULATORY_CARE_PROVIDER_SITE_OTHER): Payer: Self-pay

## 2017-03-12 ENCOUNTER — Ambulatory Visit (INDEPENDENT_AMBULATORY_CARE_PROVIDER_SITE_OTHER): Payer: BC Managed Care – PPO | Admitting: Interventional Cardiology

## 2017-03-12 ENCOUNTER — Encounter: Payer: Self-pay | Admitting: Interventional Cardiology

## 2017-03-12 VITALS — BP 116/74 | HR 98 | Ht 65.0 in | Wt 268.4 lb

## 2017-03-12 DIAGNOSIS — IMO0001 Reserved for inherently not codable concepts without codable children: Secondary | ICD-10-CM

## 2017-03-12 DIAGNOSIS — I5033 Acute on chronic diastolic (congestive) heart failure: Secondary | ICD-10-CM

## 2017-03-12 DIAGNOSIS — I10 Essential (primary) hypertension: Secondary | ICD-10-CM

## 2017-03-12 DIAGNOSIS — Z954 Presence of other heart-valve replacement: Secondary | ICD-10-CM

## 2017-03-12 DIAGNOSIS — S21109A Unspecified open wound of unspecified front wall of thorax without penetration into thoracic cavity, initial encounter: Secondary | ICD-10-CM

## 2017-03-12 DIAGNOSIS — I82503 Chronic embolism and thrombosis of unspecified deep veins of lower extremity, bilateral: Secondary | ICD-10-CM | POA: Diagnosis not present

## 2017-03-12 NOTE — Patient Instructions (Signed)
Medication Instructions:  None  Labwork: None  Testing/Procedures: None  Follow-Up: Your physician recommends that you schedule a follow-up appointment in: 4-6 months with Dr. Katrinka BlazingSmith.  You have been referred to Phase 2 Cardiac Rehab.    Any Other Special Instructions Will Be Listed Below (If Applicable).     If you need a refill on your cardiac medications before your next appointment, please call your pharmacy.

## 2017-04-07 ENCOUNTER — Other Ambulatory Visit: Payer: Self-pay

## 2017-04-07 DIAGNOSIS — J90 Pleural effusion, not elsewhere classified: Secondary | ICD-10-CM

## 2017-04-13 ENCOUNTER — Ambulatory Visit: Payer: BC Managed Care – PPO | Admitting: Thoracic Surgery (Cardiothoracic Vascular Surgery)

## 2017-04-16 ENCOUNTER — Telehealth (HOSPITAL_COMMUNITY): Payer: Self-pay

## 2017-04-16 NOTE — Telephone Encounter (Signed)
I called patient to discuss scheduling for cardiac rehab. Patient informed that the program is 3 days a week.  Patient declined at first due to financial issues. Patient stated she wanted to contact her insurance company [employer]) first and then call me back. Patient given my contact information to return call.

## 2017-04-22 ENCOUNTER — Encounter (HOSPITAL_COMMUNITY): Payer: Self-pay

## 2017-04-26 ENCOUNTER — Other Ambulatory Visit: Payer: Self-pay | Admitting: Physician Assistant

## 2017-04-30 ENCOUNTER — Telehealth (HOSPITAL_COMMUNITY): Payer: Self-pay

## 2017-04-30 ENCOUNTER — Other Ambulatory Visit: Payer: Self-pay | Admitting: Physician Assistant

## 2017-04-30 NOTE — Telephone Encounter (Signed)
I called and left message on voicemail to call office about scheduling for cardiac rehab. I left office contact information on patient voicemail to return call.  ° °

## 2017-05-04 ENCOUNTER — Ambulatory Visit
Admission: RE | Admit: 2017-05-04 | Discharge: 2017-05-04 | Disposition: A | Discharge status: Home / Self Care | Payer: BC Managed Care – PPO | Source: Ambulatory Visit | Attending: Thoracic Surgery (Cardiothoracic Vascular Surgery) | Admitting: Thoracic Surgery (Cardiothoracic Vascular Surgery)

## 2017-05-04 ENCOUNTER — Encounter: Payer: Self-pay | Admitting: Thoracic Surgery (Cardiothoracic Vascular Surgery)

## 2017-05-04 ENCOUNTER — Ambulatory Visit (INDEPENDENT_AMBULATORY_CARE_PROVIDER_SITE_OTHER): Payer: BC Managed Care – PPO | Admitting: Thoracic Surgery (Cardiothoracic Vascular Surgery)

## 2017-05-04 VITALS — BP 139/80 | HR 64 | Resp 20 | Ht 65.0 in | Wt 272.0 lb

## 2017-05-04 DIAGNOSIS — J90 Pleural effusion, not elsewhere classified: Secondary | ICD-10-CM

## 2017-05-04 DIAGNOSIS — Q231 Congenital insufficiency of aortic valve: Secondary | ICD-10-CM

## 2017-05-04 DIAGNOSIS — Q230 Congenital stenosis of aortic valve: Secondary | ICD-10-CM

## 2017-05-04 DIAGNOSIS — Z954 Presence of other heart-valve replacement: Secondary | ICD-10-CM | POA: Diagnosis not present

## 2017-05-04 NOTE — Patient Instructions (Signed)
Continue all previous medications without any changes at this time  You are encouraged to enroll and participate in the outpatient cardiac rehab program beginning as soon as practical.  Continue to avoid any heavy lifting or strenuous use of your arms or shoulders until the soreness has resolved.  You may gradually increase how much you lift or otherwise use your arms or chest as tolerated, with limits based upon whether or not activities lead to the return of significant discomfort.  You have been advised of the numerous benefits associated with regular exercise and weight loss.  The long term benefits for your overall health and wellbeing cannot be overestimated.  Make every effort to stay physically active, get some type of exercise on a regular basis, and stick to a "heart healthy diet".  The long term benefits for regular exercise and a healthy diet are critically important to your overall health and wellbeing.  Endocarditis is a potentially serious infection of heart valves or inside lining of the heart.  It occurs more commonly in patients with diseased heart valves (such as patient's with aortic or mitral valve disease) and in patients who have undergone heart valve repair or replacement.  Certain surgical and dental procedures may put you at risk, such as dental cleaning, other dental procedures, or any surgery involving the respiratory, urinary, gastrointestinal tract, gallbladder or prostate gland.   To minimize your chances for develooping endocarditis, maintain good oral health and seek prompt medical attention for any infections involving the mouth, teeth, gums, skin or urinary tract.    Always notify your doctor or dentist about your underlying heart valve condition before having any invasive procedures. You will need to take antibiotics before certain procedures, including all routine dental cleanings or other dental procedures.  Your cardiologist or dentist should prescribe these  antibiotics for you to be taken ahead of time.

## 2017-05-04 NOTE — Progress Notes (Signed)
[address]       Christine Hicks [address]             [phone]     CARDIOTHORACIC SURGERY OFFICE NOTE  Referring Provider is Lyn Records, MD PCP is Billee Cashing, MD   HPI:  Patient is a 61 year old morbidly obese African-American female with history of bicuspid aortic valve and severe aortic stenosis, chronic diastolic congestive heart failure, hypertension, obstructive sleep apnea, recurrent deep venous thrombosis on chronic anticoagulation, iron deficient anemia, and thyroid disease who returns to the office today for routine follow-up status post minimally invasive aortic valve replacement using a stentless porcine aortic root replacement for root enlargement via partial upper mini sternotomy on 01/01/2017.  Her early postoperative recovery in the hospital was essentially uncomplicated although relatively slow because of her morbid obesity, limited mobility, and chronic medical problems. She eventually was discharged home on the ninth postoperative day with home health physical therapy.  She was last seen here in our office on 02/09/2017 at which time she was doing fairly well.  She was seen in follow-up by Dr. Katrinka Blazing in August and she returns to our office for routine follow-up today. She has not had a follow-up echocardiogram performed since her surgery.  She reports that overall she is doing well and slowly improving. She still has mild soreness across her chest but it does not slow her down to much. She admits that she is still not terribly active physically but she is walking more than she had been previously. Her breathing has improved. She has not had palpitations or dizzy spells. She wanted to participate in outpatient cardiac rehabilitation program but was told that her insurance wouldn't cover it.   Current Outpatient Prescriptions  Medication Sig Dispense Refill  . aspirin EC 81 MG tablet Take 81 mg by mouth daily.    . chlorhexidine (PERIDEX) 0.12 %  solution Rinse with 15 mls twice daily for 30 seconds. Use after breakfast and at bedtime. Spit out excess. Do not swallow. 480 mL prn  . ELIQUIS 5 MG TABS tablet TAKE 1 TABLET BY MOUTH TWICE A DAY 60 tablet 5  . ferrous sulfate 325 (65 FE) MG tablet Take 1 tablet (325 mg total) by mouth 2 (two) times daily with a meal. 60 tablet 0  . furosemide (LASIX) 40 MG tablet Take 2 tablets by mouth in the a.m. And 1 tablet in the p m for 2 days then go back to 1 tablet by mouth twice a day 62 tablet 5  . levothyroxine (SYNTHROID, LEVOTHROID) 150 MCG tablet Take 1 tablet (150 mcg total) by mouth daily before breakfast. 30 tablet 2  . metoprolol tartrate (LOPRESSOR) 25 MG tablet Take 1 tablet (25 mg total) by mouth 2 (two) times daily. 60 tablet 3  . pantoprazole (PROTONIX) 40 MG tablet TAKE 1 TABLET (40 MG TOTAL) BY MOUTH DAILY. 30 tablet 6  . potassium chloride SA (K-DUR,KLOR-CON) 20 MEQ tablet Take 2 tablets (40 mEq total) by mouth daily. 60 tablet 3   No current facility-administered medications for this visit.       Physical Exam:   BP 139/80   Pulse 64   Resp 20   Ht  (1.651 m)   Wt 272 lb (123.4 kg)   LMP  (LMP Unknown)   SpO2 97% Comment: RA  BMI 45.26 kg/m   General:  Morbidly obese but well appearing  Chest:   Clear to auscultation with  symmetrical breath sounds  CV:   Regular rate and rhythm without murmur  Incisions:  Completely healed, sternum is stable  Abdomen:  Soft nontender  Extremities:  Warm and well-perfused  Diagnostic Tests:  CHEST  2 VIEW  COMPARISON:  02/09/2017  FINDINGS: The cardio pericardial silhouette is enlarged. Patient is status post cardiac valve replacement. There is pulmonary vascular congestion without overt pulmonary edema. Trace pleural effusion noted on the right. The visualized bony structures of the thorax are intact.  IMPRESSION: Trace right pleural effusion.   Electronically Signed   By: Kennith Center M.D.   On: 05/04/2017  15:29   Impression:  Patient is doing well approximately 3 months status post aortic root replacement using a stentless porcine aortic root graft for severe symptomatic aortic stenosis with relatively small sized aortic root requiring root enlargement.  Plan:  We have not recommended any changes to patient's current medications. I've encouraged her to continue to gradually increase her physical activity as tolerated. I suggested that she should avoid any heavy lifting or strenuous use of her arms or shoulders until the soreness has abated further. I think she would benefit from outpatient cardiac rehabilitation program and we will try to resubmit paperwork. I don't understand why insurance coverage was denied previously. The patient has been reminded regarding the importance of dental hygiene and the lifelong need for antibiotic prophylaxis for all dental cleanings and other related invasive procedures.  The patient will continue to follow-up with Dr. Katrinka Blazing and return to our office on the should specific problems or questions arise. At some point she needs routine follow-up echocardiogram performed. We will defer timing to Dr. Katrinka Blazing. Otherwise we will plan to see her next summer, approximately 1 year following her original surgery.  I spent in excess of 15 minutes during the conduct of this office consultation and >50% of this time involved direct face-to-face encounter with the patient for counseling and/or coordination of their care.    Salvatore Decent. Cornelius Moras, MD 05/04/2017 3:57 PM

## 2017-05-07 ENCOUNTER — Other Ambulatory Visit: Payer: Self-pay | Admitting: Physician Assistant

## 2017-05-26 ENCOUNTER — Other Ambulatory Visit: Payer: Self-pay | Admitting: Interventional Cardiology

## 2017-05-26 MED ORDER — PANTOPRAZOLE SODIUM 40 MG PO TBEC: 40.0000 mg | DELAYED_RELEASE_TABLET | Freq: Every day | ORAL | 2 refills | Status: DC

## 2017-06-03 ENCOUNTER — Other Ambulatory Visit: Payer: Self-pay | Admitting: Physician Assistant

## 2017-06-03 NOTE — Telephone Encounter (Signed)
Yes-please refill for 6 months 

## 2017-06-04 ENCOUNTER — Other Ambulatory Visit: Payer: Self-pay | Admitting: Physician Assistant

## 2017-06-04 ENCOUNTER — Other Ambulatory Visit: Payer: Self-pay | Admitting: Cardiology

## 2017-07-16 ENCOUNTER — Ambulatory Visit: Payer: Self-pay | Admitting: Interventional Cardiology

## 2017-07-25 ENCOUNTER — Other Ambulatory Visit: Payer: Self-pay | Admitting: Interventional Cardiology

## 2017-09-17 ENCOUNTER — Other Ambulatory Visit: Payer: Self-pay | Admitting: Cardiology

## 2017-09-17 DIAGNOSIS — I10 Essential (primary) hypertension: Secondary | ICD-10-CM

## 2017-11-09 ENCOUNTER — Other Ambulatory Visit: Payer: Self-pay

## 2017-11-09 ENCOUNTER — Inpatient Hospital Stay (HOSPITAL_COMMUNITY)
Admission: EM | Admit: 2017-11-09 | Discharge: 2017-11-20 | DRG: 292 | Disposition: A | Discharge status: Home Health | Payer: Medicare Other | Attending: Family Medicine | Admitting: Family Medicine

## 2017-11-09 ENCOUNTER — Emergency Department (HOSPITAL_COMMUNITY): Payer: Medicare Other

## 2017-11-09 ENCOUNTER — Encounter (HOSPITAL_COMMUNITY): Payer: Self-pay | Admitting: Emergency Medicine

## 2017-11-09 DIAGNOSIS — I071 Rheumatic tricuspid insufficiency: Secondary | ICD-10-CM | POA: Diagnosis present

## 2017-11-09 DIAGNOSIS — J309 Allergic rhinitis, unspecified: Secondary | ICD-10-CM | POA: Diagnosis present

## 2017-11-09 DIAGNOSIS — Z6841 Body Mass Index (BMI) 40.0 and over, adult: Secondary | ICD-10-CM | POA: Diagnosis not present

## 2017-11-09 DIAGNOSIS — R601 Generalized edema: Secondary | ICD-10-CM | POA: Diagnosis present

## 2017-11-09 DIAGNOSIS — I872 Venous insufficiency (chronic) (peripheral): Secondary | ICD-10-CM | POA: Diagnosis present

## 2017-11-09 DIAGNOSIS — G4733 Obstructive sleep apnea (adult) (pediatric): Secondary | ICD-10-CM | POA: Diagnosis present

## 2017-11-09 DIAGNOSIS — Z87891 Personal history of nicotine dependence: Secondary | ICD-10-CM

## 2017-11-09 DIAGNOSIS — I361 Nonrheumatic tricuspid (valve) insufficiency: Secondary | ICD-10-CM | POA: Diagnosis not present

## 2017-11-09 DIAGNOSIS — Z86718 Personal history of other venous thrombosis and embolism: Secondary | ICD-10-CM

## 2017-11-09 DIAGNOSIS — R000 Tachycardia, unspecified: Secondary | ICD-10-CM | POA: Diagnosis present

## 2017-11-09 DIAGNOSIS — K219 Gastro-esophageal reflux disease without esophagitis: Secondary | ICD-10-CM | POA: Diagnosis present

## 2017-11-09 DIAGNOSIS — I5033 Acute on chronic diastolic (congestive) heart failure: Secondary | ICD-10-CM | POA: Diagnosis present

## 2017-11-09 DIAGNOSIS — I4510 Unspecified right bundle-branch block: Secondary | ICD-10-CM | POA: Diagnosis present

## 2017-11-09 DIAGNOSIS — Z953 Presence of xenogenic heart valve: Secondary | ICD-10-CM

## 2017-11-09 DIAGNOSIS — J00 Acute nasopharyngitis [common cold]: Secondary | ICD-10-CM | POA: Diagnosis not present

## 2017-11-09 DIAGNOSIS — Z7982 Long term (current) use of aspirin: Secondary | ICD-10-CM | POA: Diagnosis not present

## 2017-11-09 DIAGNOSIS — Z825 Family history of asthma and other chronic lower respiratory diseases: Secondary | ICD-10-CM

## 2017-11-09 DIAGNOSIS — I272 Pulmonary hypertension, unspecified: Secondary | ICD-10-CM | POA: Diagnosis present

## 2017-11-09 DIAGNOSIS — Z7901 Long term (current) use of anticoagulants: Secondary | ICD-10-CM

## 2017-11-09 DIAGNOSIS — Z954 Presence of other heart-valve replacement: Secondary | ICD-10-CM | POA: Diagnosis not present

## 2017-11-09 DIAGNOSIS — E039 Hypothyroidism, unspecified: Secondary | ICD-10-CM | POA: Diagnosis present

## 2017-11-09 DIAGNOSIS — I110 Hypertensive heart disease with heart failure: Secondary | ICD-10-CM | POA: Diagnosis present

## 2017-11-09 DIAGNOSIS — R609 Edema, unspecified: Secondary | ICD-10-CM

## 2017-11-09 DIAGNOSIS — E876 Hypokalemia: Secondary | ICD-10-CM | POA: Diagnosis present

## 2017-11-09 DIAGNOSIS — I10 Essential (primary) hypertension: Secondary | ICD-10-CM | POA: Diagnosis not present

## 2017-11-09 DIAGNOSIS — Z8249 Family history of ischemic heart disease and other diseases of the circulatory system: Secondary | ICD-10-CM | POA: Diagnosis not present

## 2017-11-09 DIAGNOSIS — D509 Iron deficiency anemia, unspecified: Secondary | ICD-10-CM | POA: Diagnosis present

## 2017-11-09 LAB — CBC
HEMATOCRIT: 40.1 % (ref 36.0–46.0)
HEMOGLOBIN: 12.4 g/dL (ref 12.0–15.0)
MCH: 32.4 pg (ref 26.0–34.0)
MCHC: 30.9 g/dL (ref 30.0–36.0)
MCV: 104.7 fL — ABNORMAL HIGH (ref 78.0–100.0)
Platelets: 173 10*3/uL (ref 150–400)
RBC: 3.83 MIL/uL — ABNORMAL LOW (ref 3.87–5.11)
RDW: 17 % — ABNORMAL HIGH (ref 11.5–15.5)
WBC: 6.1 10*3/uL (ref 4.0–10.5)

## 2017-11-09 LAB — BRAIN NATRIURETIC PEPTIDE: B Natriuretic Peptide: 316 pg/mL — ABNORMAL HIGH (ref 0.0–100.0)

## 2017-11-09 LAB — I-STAT TROPONIN, ED: Troponin i, poc: 0 ng/mL (ref 0.00–0.08)

## 2017-11-09 LAB — BASIC METABOLIC PANEL
ANION GAP: 11 (ref 5–15)
BUN: 16 mg/dL (ref 6–20)
CHLORIDE: 107 mmol/L (ref 101–111)
CO2: 22 mmol/L (ref 22–32)
Calcium: 9 mg/dL (ref 8.9–10.3)
Creatinine, Ser: 1.05 mg/dL — ABNORMAL HIGH (ref 0.44–1.00)
GFR calc Af Amer: >60 mL/min (ref >60)
GFR calc non Af Amer: 55 mL/min — ABNORMAL LOW (ref >60)
GLUCOSE: 145 mg/dL — AB (ref 65–99)
POTASSIUM: 3.8 mmol/L (ref 3.5–5.1)
Sodium: 140 mmol/L (ref 135–145)

## 2017-11-09 LAB — PROTIME-INR
INR: 2.04
Prothrombin Time: 22.9 seconds — ABNORMAL HIGH (ref 11.4–15.2)

## 2017-11-09 MED ORDER — LISINOPRIL 5 MG PO TABS
2.5000 mg | ORAL_TABLET | Freq: Every day | ORAL | Status: DC
  Administered 2017-11-10 – 2017-11-12 (×3): 2.5 mg via ORAL
  Filled 2017-11-09 (×3): qty 1

## 2017-11-09 MED ORDER — SODIUM CHLORIDE 0.9% FLUSH: 3.0000 mL | INTRAVENOUS | Status: DC | PRN

## 2017-11-09 MED ORDER — ONDANSETRON HCL 4 MG/2ML IJ SOLN: 4.0000 mg | Freq: Four times a day (QID) | INTRAMUSCULAR | Status: DC | PRN

## 2017-11-09 MED ORDER — PANTOPRAZOLE SODIUM 40 MG PO TBEC
40.0000 mg | DELAYED_RELEASE_TABLET | Freq: Every day | ORAL | Status: DC
  Administered 2017-11-10 – 2017-11-20 (×11): 40 mg via ORAL
  Filled 2017-11-09 (×5): qty 1
  Filled 2017-11-09: qty 2
  Filled 2017-11-09 (×4): qty 1
  Filled 2017-11-09: qty 2
  Filled 2017-11-09: qty 1

## 2017-11-09 MED ORDER — SODIUM CHLORIDE 0.9 % IV SOLN: 250.0000 mL | INTRAVENOUS | Status: DC | PRN

## 2017-11-09 MED ORDER — APIXABAN 5 MG PO TABS
5.0000 mg | ORAL_TABLET | Freq: Two times a day (BID) | ORAL | Status: DC
  Administered 2017-11-09 – 2017-11-20 (×22): 5 mg via ORAL
  Filled 2017-11-09 (×22): qty 1

## 2017-11-09 MED ORDER — ASPIRIN EC 81 MG PO TBEC
81.0000 mg | DELAYED_RELEASE_TABLET | Freq: Every day | ORAL | Status: DC
  Administered 2017-11-10 – 2017-11-20 (×11): 81 mg via ORAL
  Filled 2017-11-09 (×11): qty 1

## 2017-11-09 MED ORDER — ACETAMINOPHEN 325 MG PO TABS
650.0000 mg | ORAL_TABLET | ORAL | Status: DC | PRN
  Administered 2017-11-09 – 2017-11-20 (×12): 650 mg via ORAL
  Filled 2017-11-09 (×14): qty 2

## 2017-11-09 MED ORDER — METOPROLOL TARTRATE 25 MG PO TABS
25.0000 mg | ORAL_TABLET | Freq: Two times a day (BID) | ORAL | Status: DC
  Administered 2017-11-09 – 2017-11-13 (×6): 25 mg via ORAL
  Filled 2017-11-09 (×8): qty 1

## 2017-11-09 MED ORDER — MORPHINE SULFATE (PF) 4 MG/ML IV SOLN
2.0000 mg | INTRAVENOUS | Status: DC | PRN
  Administered 2017-11-09: 2 mg via INTRAVENOUS
  Filled 2017-11-09: qty 1

## 2017-11-09 MED ORDER — LEVOTHYROXINE SODIUM 75 MCG PO TABS
150.0000 ug | ORAL_TABLET | Freq: Every day | ORAL | Status: DC
  Administered 2017-11-10: 150 ug via ORAL
  Filled 2017-11-09: qty 2

## 2017-11-09 MED ORDER — SODIUM CHLORIDE 0.9% FLUSH
3.0000 mL | Freq: Two times a day (BID) | INTRAVENOUS | Status: DC
  Administered 2017-11-09 – 2017-11-19 (×19): 3 mL via INTRAVENOUS

## 2017-11-09 MED ORDER — FUROSEMIDE 10 MG/ML IJ SOLN
40.0000 mg | Freq: Once | INTRAMUSCULAR | Status: AC
  Administered 2017-11-09: 40 mg via INTRAVENOUS
  Filled 2017-11-09: qty 4

## 2017-11-09 MED ORDER — FUROSEMIDE 10 MG/ML IJ SOLN
60.0000 mg | Freq: Two times a day (BID) | INTRAMUSCULAR | Status: DC
  Administered 2017-11-09 – 2017-11-10 (×3): 60 mg via INTRAVENOUS
  Filled 2017-11-09 (×3): qty 6

## 2017-11-09 NOTE — H&P (Signed)
History and Physical    Christine Hicks XLK:[id number] DOB: [birth year] DOA: 11/09/2017  PCP: Billee Cashing, MD - needs new PCP Consultants:  Katrinka Blazing - cardiology; Barry Dienes - CT surgery Patient coming from:  Home - lives with husband, son; Jackey Loge: son, [phone]; [phone]  Chief Complaint:  swelling  HPI: Christine Hicks is a 62 y.o. female with medical history significant of hypothyroidism; chronic diastolic dysfunction; AVR in mid 2018; and morbid obesity presenting with chest pain.  She has been retaining fluid - it got to the point where she "thought it was coming off with the medication but it was lilke I had a setback.  Couldn't hardly walk, couldn't get up on my own".  The other night, she couldn't turn over in the bed.  She has gained 30-40 pounds in the last 2 months.  She has generalized edema, but especially in her R > L foot.  Her feet and legs are so swollen that she can't close the strap on her shoes.  +SOB, mainly with exertion.  Some cough, nonproductive.  A couple of days, she felt a little dizzy, would lose her her balance a bit.  No significant chest pain.  No similar problems since the surgery - AVR and aortic root surgery in 6/18.     ED Course:   6 weeks of increased SOB, now with anasarca, crackles, CXR with fluid overload.  Usually Lasix 20 mg PO BID, given 40 mg IV.  Noted to have a small amount of rectal bleeding - ?hemorrhoidal.     Review of Systems: As per HPI; otherwise review of systems reviewed and negative.   Ambulatory Status:  Ambulates with a cane, but recently she is having to use her walker  Past Medical History:  Diagnosis Date  . Acute on chronic diastolic congestive heart failure (HCC)   . Anemia, iron deficiency    negative egd/colonoscopy 11/16/2015  . Aortic stenosis, severe   . Arthritis   . Bicuspid aortic valve   . DVT (deep venous thrombosis) (HCC)    RLE DVT 11/12/15  . GERD (gastroesophageal reflux disease)   . Heart murmur   .  Hypertension   . Insomnia   . Morbid obesity with BMI of 45.0-49.9, adult (HCC)   . PONV (postoperative nausea and vomiting)    took a long time to wake up  . 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 Thyroid disease   . Wears glasses   . Wears partial dentures     Past Surgical History:  Procedure Laterality Date  . 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 AORTIC VALVE REPLACEMENT N/A 01/01/2017   Procedure: MINIMALLY INVASIVE AORTIC VALVE REPLACEMENT (AVR);  Surgeon: Purcell Nails, MD;  Location: [facility] OR;  Service: Open Heart Surgery;  Laterality: N/A;  . ASCENDING AORTIC ROOT REPLACEMENT N/A 01/01/2017   Procedure: ASCENDING AORTIC ROOT REPLACEMENT;  Surgeon: Purcell Nails, MD;  Location: [facility] OR;  Service: Open Heart Surgery;  Laterality: N/A;  . CARDIAC CATHETERIZATION     12/16/16  . COLONOSCOPY N/A 11/16/2015   Procedure: COLONOSCOPY;  Surgeon: Iva Boop, MD;  Location: [facility] ENDOSCOPY;  Service: Endoscopy;  Laterality: N/A;  . ESOPHAGOGASTRODUODENOSCOPY N/A 11/16/2015   Procedure: ESOPHAGOGASTRODUODENOSCOPY (EGD);  Surgeon: Iva Boop,  MD;  Location: [facility] ENDOSCOPY;  Service: Endoscopy;  Laterality: N/A;  . HERNIA REPAIR    . MULTIPLE TOOTH EXTRACTIONS    . Right leg cellulitis surgery    . TEE WITHOUT CARDIOVERSION N/A 01/01/2017   Procedure: TRANSESOPHAGEAL ECHOCARDIOGRAM (TEE);  Surgeon: Purcell Nails, MD;  Location: [facility] OR;  Service: Open Heart Surgery;  Laterality: N/A;    Social History   Socioeconomic History  . Marital status: Significant Other    Spouse name: Not on file  . Number of children: Not on file  . Years of education: Not on file  . Highest education level: Not on file  Occupational History  . Occupation: retired    Engineer, production  . Financial resource strain: Not on file  . Food insecurity:    Worry: Not on file    Inability: Not on file  . Transportation needs:    Medical: Not on file    Non-medical: Not on file  Tobacco Use  . Smoking status: Former Smoker    Types: Cigarettes  . Smokeless tobacco: Never Used  . Tobacco comment: quit smoking cigarettes > 25 years ago  Substance and Sexual Activity  . Alcohol use: No  . Drug use: No  . Sexual activity: Yes  Lifestyle  . Physical activity:    Days per week: Not on file    Minutes per session: Not on file  . Stress: Not on file  Relationships  . Social connections:    Talks on phone: Not on file    Gets together: Not on file    Attends religious service: Not on file    Active member of club or organization: Not on file    Attends meetings of clubs or organizations: Not on file    Relationship status: Not on file  . Intimate partner violence:    Fear of current or ex partner: Not on file    Emotionally abused: Not on file    Physically abused: Not on file    Forced sexual activity: Not on file  Other Topics Concern  . Not on file  Social History Narrative  . Not on file    Allergies  Allergen Reactions  . No Known Allergies     Family History  Problem Relation Age of Onset  . Cardiomyopathy Son   . Heart attack Mother   . Emphysema Father   . Lung cancer Maternal Aunt     Prior to Admission medications   Medication Sig Start Date End Date Taking? Authorizing Provider  aspirin EC 81 MG tablet Take 81 mg by mouth daily.    [provider]  chlorhexidine (PERIDEX) 0.12 % solution Rinse with 15 mls twice daily for 30 seconds. Use after breakfast and at bedtime. Spit out excess. Do not swallow. 12/29/16   Charlynne Pander, DDS  ELIQUIS 5 MG TABS tablet TAKE 1 TABLET BY MOUTH TWICE A DAY 07/27/17   Lyn Records, MD  ferrous sulfate 325 (65 FE) MG tablet Take 1 tablet (325 mg total) by mouth 2 (two) times daily with  a meal. 11/18/15   Ghimire, Werner Lean, MD  furosemide (LASIX) 40 MG tablet Take 2 tablets by mouth in the a.m. And 1 tablet in the p m for 2 days then go back to 1 tablet by mouth twice a day 01/26/17   Leone Brand, NP  furosemide (LASIX) 40 MG tablet TAKE 1 TABLET BY MOUTH TWICE A DAY 09/17/17   Annie Paras,  Darcella Gasman, NP  KLOR-CON M20 20 MEQ tablet TAKE 2 TABLETS BY MOUTH DAILY 06/04/17   Leone Brand, NP  levothyroxine (SYNTHROID, LEVOTHROID) 150 MCG tablet Take 1 tablet (150 mcg total) by mouth daily before breakfast. 12/18/16   Berton Bon, NP  levothyroxine (SYNTHROID, LEVOTHROID) 175 MCG tablet Take 175 mcg by mouth daily. 09/23/17   [provider]  metoprolol tartrate (LOPRESSOR) 25 MG tablet Take 1 tablet (25 mg total) by mouth 2 (two) times daily. 01/10/17   Barrett, Erin R, PA-C  metoprolol tartrate (LOPRESSOR) 25 MG tablet TAKE 1 TABLET BY MOUTH TWICE A DAY 06/03/17   Leone Brand, NP  pantoprazole (PROTONIX) 40 MG tablet Take 1 tablet (40 mg total) by mouth daily. 05/26/17   Lyn Records, MD    Physical Exam: Vitals:   11/09/17 1225 11/09/17 1230 11/09/17 1300 11/09/17 1400  BP:  (!) 129/94 136/90 (!) 135/92  Pulse: (!) 101 97 97 (!) 102  Resp: 11 (!) 26 (!) 28 17  Temp:      TempSrc:      SpO2: 100% 100% 100% 97%     General:  Appears calm and comfortable and is NAD Eyes:  PERRL, EOMI, normal lids, iris ENT:  grossly normal hearing, lips & tongue, mmm Neck:  no LAD, masses or thyromegaly Cardiovascular:  RRR, no m/r/g.  Respiratory:   CTA bilaterally with no wheezes/rales/rhonchi - although this is quite limited based on her body habitus.  Normal respiratory effort. Abdomen:  soft, NT, ND, NABS; marked abdominal wall adiposity making it difficult to distinguish the extent of current edema.  She also has marked deformity from remote hernia surgery complications. Skin:  no rash or induration seen on limited exam Musculoskeletal:  grossly normal tone BUE/BLE, good  ROM, no bony abnormality Lower extremity:  Marked LE edema - although again this is difficult to assess given her body habitus.  She has dense scarring of the left calf that chronically causes the left calf to be atrophic and less edematous than the right - but other she has elephantiasis of the LE that is difficult to quantify.  Limited foot exam with no ulcerations.  2+ distal pulses. Psychiatric:  grossly normal mood and affect, speech fluent and appropriate, AOx3 Neurologic:  CN 2-12 grossly intact, moves all extremities in coordinated fashion, sensation intact    Radiological Exams on Admission: Dg Chest 2 View  Result Date: 11/09/2017 CLINICAL DATA:  Chest pain and shortness of breath over the last month. Lower extremity swelling. EXAM: CHEST - 2 VIEW COMPARISON:  05/04/2017 FINDINGS: Previous median sternotomy. Chronic cardiomegaly. Pulmonary venous hypertension with mild interstitial edema. No infiltrate, collapse or large effusion. Small effusions in the posterior costophrenic angles. No acute bone finding. IMPRESSION: Congestive heart failure/fluid overload. Cardiomegaly. Venous hypertension. Mild interstitial edema. Small effusions. Electronically Signed   By: Paulina Fusi M.D.   On: 11/09/2017 11:40    EKG: Independently reviewed.  Sinus tachycardia with rate 104; RBBB with no evidence of acute ischemia   Labs on Admission: I have personally reviewed the available labs and imaging studies at the time of the admission.  Pertinent labs:   Glucose 145 BUN 16/1.05 Troponin 0.00 Essentially normal CBC BNP 316   Assessment/Plan Principal Problem:   Anasarca Active Problems:   Hypothyroidism   Essential hypertension   Acute on chronic diastolic heart failure (HCC)   Morbid obesity (HCC)   S/P partial sternotomy for aortic root replacement with stentless porcine aortic  root graft    Anasarca/acute on chronic diastolic heart failure -Patient with marked morbid obesity  presenting with worsening SOB and anasarca -CXR consistent with pulmonary edema -Elevated BNP -With elevated BNP and abnl CXR, CHF seems most probable as diagnosis  -Will admit with telemetry -Will request echocardiogram - she has not had a follow-up Echo since her AVR in 6/18 -Will continue ASA -Will start Lisinopril 5 mg daily (BP is normal to high and so should support addition of medication) -Continue beta blocker -CHF order set utilized; may need CHF team consult but will hold until Echo results are available -Was given Lasix 40 mg x 1 in ER and will repeat with 60 mg IV BID -Continue [place] O2 for now -Normal kidney function at this time, will follow -Repeat EKG in AM -Consider cardiologist consultation based on Echo results  Morbid obesity -Patient has marked obesity making it difficult to fully ascertain what is her baseline habitus vs. Edema -She also has had decreased mobility as a result of the worsening edema, which may also have worsened her obesity as well as her functional status -She is likely to benefit from PT/OT evaluation once she is able to be more mobile  HTN -Continue Lopressor -Add lisinopril  Hypothyroidism -Check TSH - normal in 6/18 -Continue Synthroid at current dose for now  S/p AVR on Eliquis -Continue Eliquis -May need Dr. Cornelius Moraswen to consult based on Echo results   DVT prophylaxis:  Lovenox  Code Status: Full - confirmed with patient/family Family Communication: Husband and son present throughout evaluation Disposition Plan:  Home once clinically improved Consults called: None  Admission status: Admit - It is my clinical opinion that admission to INPATIENT is reasonable and necessary because of the expectation that this patient will require hospital care that crosses at least 2 midnights to treat this condition based on the medical complexity of the problems presented.  Given the aforementioned information, the predictability of an adverse outcome is  felt to be significant.    Jonah BlueJennifer Neftali Abair MD [facility]  If note is complete, please contact covering daytime or nighttime physician. [email or web address] Password [email or web address]  11/09/2017, 2:23 PM

## 2017-11-09 NOTE — Care Management Note (Signed)
Case Management Note  Patient Details  Name: Christine Hicks MRN: [id number] Date of Birth: 02/11/1956  Subjective/Objective:                  62 y.o. female with medical history significant of hypothyroidism; chronic diastolic dysfunction; AVR in mid 2018; and morbid obesity presenting with chest pain.  From home with significant other.  Action/Plan: Admit status INPATIENT (CHF); anticipate discharge HOME WITH HOME HEALTH.   Expected Discharge Date:  (unknown)               Expected Discharge Plan:  Home w Home Health Services  In-House Referral:  PCP / [facility]  Discharge planning Services  CM Consult  Post Acute Care Choice:    Choice offered to:     DME Arranged:    DME Agency:     HH Arranged:    HH Agency:     Status of Service:  In process, will continue to follow  If discussed at Long Length of Stay Meetings, dates discussed:    Additional Comments: PCP: Billee CashingWAYLAND MCKENZIE (Pt will need PCP referral as her PCP no longer practices).  Oletta CohnWood, [place] Elling, RN 11/09/2017, 2:34 PM

## 2017-11-09 NOTE — ED Triage Notes (Signed)
States cp x 1 mo nth and swelling since march , now  Sob for A COUPLE OF WEEKS,  HAS APPOINTMENT  May 15 but hurting to bad

## 2017-11-09 NOTE — ED Provider Notes (Signed)
[facility] Provider Note   CSN: [id number] Arrival date & time: 11/09/17  1025     History   Chief Complaint Chief Complaint  Patient presents with  . Chest Pain    HPI Christine Hicks is a 62 y.o. female.  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  Patient has had increasing shortness of breath over the last 6 weeks.  Patient has not increased her Lasix.  She had 20 mg twice daily.  Patient has noticed a 30 pound weight gain.  And increasing shortness of breath.  She is noticed that the swelling started her ankles is moved up to her abdomen.    Past Medical History:  Diagnosis Date  . Acute on chronic diastolic congestive heart failure (HCC)   . Anemia, iron deficiency    negative egd/colonoscopy 11/16/2015  . Aortic stenosis, severe   . Arthritis   . Bicuspid aortic valve   . DVT (deep venous thrombosis) (HCC)    RLE DVT 11/12/15  . GERD (gastroesophageal reflux disease)   . Heart murmur   . Hypertension   . Insomnia   . Morbid obesity (HCC)   . Morbid obesity with BMI of 45.0-49.9, adult (HCC)   . PONV (postoperative nausea and vomiting)    took a long time to wake up  . 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 Thyroid disease   . Wears glasses   . Wears partial dentures     Patient Active Problem List   Diagnosis Date Noted  . S/P partial sternotomy for aortic root replacement with stentless  porcine aortic root graft  01/01/2017  . Morbid obesity (HCC)   . Acute on chronic diastolic heart failure (HCC) 12/11/2016  . Dyspnea 11/11/2015  . Hypothyroidism 09/24/2009  . Essential hypertension 09/24/2009  . DVT, recurrent, lower extremity, chronic, bilateral (HCC) 09/24/2009    Past Surgical History:  Procedure Laterality Date  . 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 AORTIC VALVE REPLACEMENT N/A 01/01/2017   Procedure: MINIMALLY INVASIVE AORTIC VALVE REPLACEMENT (AVR);  Surgeon: Purcell Nailswen, Clarence H, MD;  [facility];  Service: Open Heart Surgery;  Laterality: N/A;  . ASCENDING AORTIC ROOT REPLACEMENT N/A 01/01/2017   Procedure: ASCENDING AORTIC ROOT REPLACEMENT;  Surgeon: Purcell Nailswen, Clarence H, MD;  [facility];  Service: Open Heart Surgery;  Laterality: N/A;  . CARDIAC CATHETERIZATION     12/16/16  . COLONOSCOPY N/A 11/16/2015   Procedure: COLONOSCOPY;  Surgeon: Iva Booparl E Gessner, MD;  [facility];  Service: Endoscopy;  Laterality: N/A;  . ESOPHAGOGASTRODUODENOSCOPY N/A 11/16/2015   Procedure: ESOPHAGOGASTRODUODENOSCOPY (EGD);  Surgeon: Iva Booparl E Gessner, MD;  [facility];  Service: Endoscopy;  Laterality: N/A;  . HERNIA REPAIR    . MULTIPLE TOOTH EXTRACTIONS    . Right leg cellulitis surgery    . TEE WITHOUT CARDIOVERSION N/A 01/01/2017   Procedure: TRANSESOPHAGEAL ECHOCARDIOGRAM (TEE);  Surgeon: Purcell Nailswen, Clarence H, MD;  [facility];  Service: Open Heart Surgery;  Laterality: N/A;     OB History   None      Home Medications    Prior to Admission medications   Medication Sig Start Date End Date Taking? Authorizing Provider  aspirin EC 81 MG tablet Take 81 mg by mouth daily.    [provider]  chlorhexidine (PERIDEX) 0.12 % solution Rinse with 15 mls twice daily for 30 seconds. Use after breakfast and at bedtime. Spit out excess. Do not swallow. 12/29/16   Charlynne Pander, DDS  ELIQUIS 5 MG TABS tablet TAKE 1 TABLET  BY MOUTH TWICE A DAY 07/27/17   Lyn Records, MD  ferrous sulfate 325 (65 FE) MG tablet Take 1 tablet (325 mg total) by mouth 2 (two) times daily with a meal. 11/18/15   Ghimire, Werner Lean, MD  furosemide (LASIX) 40 MG tablet Take 2 tablets by mouth in the a.m. And 1 tablet in the p m for 2 days then go back to 1 tablet by mouth twice a day 01/26/17   Leone Brand, NP  furosemide (LASIX) 40 MG tablet TAKE 1 TABLET BY MOUTH TWICE A DAY 09/17/17   Leone Brand, NP  KLOR-CON M20 20 MEQ tablet TAKE 2 TABLETS BY MOUTH DAILY 06/04/17   Leone Brand, NP  levothyroxine (SYNTHROID, LEVOTHROID) 150 MCG tablet Take 1 tablet (150 mcg total) by mouth daily before breakfast. 12/18/16   Berton Bon, NP  levothyroxine (SYNTHROID, LEVOTHROID) 175 MCG tablet Take 175 mcg by mouth daily. 09/23/17   [provider]  metoprolol tartrate (LOPRESSOR) 25 MG tablet Take 1 tablet (25 mg total) by mouth 2 (two) times daily. 01/10/17   Barrett, Erin R, PA-C  metoprolol tartrate (LOPRESSOR) 25 MG tablet TAKE 1 TABLET BY MOUTH TWICE A DAY 06/03/17   Leone Brand, NP  pantoprazole (PROTONIX) 40 MG tablet Take 1 tablet (40 mg total) by mouth daily. 05/26/17   Lyn Records, MD    Family History Family History  Problem Relation Age of Onset  . Cardiomyopathy Son   . Heart attack Mother   . Emphysema Father   . Lung cancer Maternal Aunt     Social History Social History   Tobacco Use  . Smoking status: Former Smoker    Types: Cigarettes  . Smokeless tobacco: Never Used  . Tobacco comment: quit smoking cigarettes > 25 years ago  Substance Use Topics  . Alcohol use: No  . Drug use: No     Allergies   No known allergies   Review of Systems Review of Systems  Constitutional: Positive for fatigue. Negative for activity change.  Respiratory: Positive for cough and shortness of breath.   Cardiovascular: Positive for chest pain and leg swelling.  Gastrointestinal: Negative for abdominal pain.    All other systems reviewed and are negative.    Physical Exam Updated Vital Signs BP (!) 159/97 (BP Location: Right Arm)   Pulse (!) 103   Temp (!) 97.5 F (36.4 C) (Oral)   Resp 16   LMP  (LMP Unknown)   SpO2 100%   Physical Exam  Constitutional: She is oriented to person, place, and time. She appears well-developed and well-nourished.  62 year old morbidly obese female presenting seen with shortness of breath.  HENT:  Head: Normocephalic and atraumatic.  Eyes: Right eye exhibits no discharge.  Cardiovascular: Normal rate, intact distal pulses and normal pulses.  Pulmonary/Chest: Accessory muscle usage present. Tachypnea noted. She has rales.  Genitourinary:  Genitourinary Comments: Rectum  difficult to assess due to physical size.  However bright red blood noted.  No dark tarry stools.  Musculoskeletal:       Right lower leg: She exhibits edema.       Left lower leg: She exhibits edema.  Swelling to mid abdomen.  Swelling to mid abdomen.  Neurological: She is oriented to person, place, and time.  Skin: Skin is warm and dry. She is not diaphoretic.  Psychiatric: She has a normal mood and affect.  Nursing note and vitals reviewed.    ED Treatments / Results  Labs (all labs ordered are listed, but only abnormal results are displayed) Labs Reviewed  BASIC METABOLIC PANEL - Abnormal; Notable for the following components:      Result Value   Glucose, Bld 145 (*)    Creatinine, Ser 1.05 (*)    GFR calc non Af Amer 55 (*)    All other components within normal limits  CBC - Abnormal; Notable for the following components:   RBC 3.83 (*)    MCV 104.7 (*)    RDW 17.0 (*)    All other components within normal limits  BRAIN NATRIURETIC PEPTIDE  I-STAT TROPONIN, ED    EKG EKG Interpretation  Date/Time:  Monday November 09 2017 10:31:47 EDT Ventricular Rate:  104 PR Interval:  210 QRS Duration: 128 QT Interval:  408 QTC Calculation: 536 R Axis:   33 Text  Interpretation:  Sinus tachycardia with 1st degree A-V block Right bundle branch block difficult baseline  Confirmed by Corlis Leak, Leonela Kivi ([id number]) on 11/09/2017 11:35:11 AM   Radiology Dg Chest 2 View  Result Date: 11/09/2017 CLINICAL DATA:  Chest pain and shortness of breath over the last month. Lower extremity swelling. EXAM: CHEST - 2 VIEW COMPARISON:  05/04/2017 FINDINGS: Previous median sternotomy. Chronic cardiomegaly. Pulmonary venous hypertension with mild interstitial edema. No infiltrate, collapse or large effusion. Small effusions in the posterior costophrenic angles. No acute bone finding. IMPRESSION: Congestive heart failure/fluid overload. Cardiomegaly. Venous hypertension. Mild interstitial edema. Small effusions. Electronically Signed   By: Paulina Fusi M.D.   On: 11/09/2017 11:40    Procedures Procedures (including critical care time)  Medications Ordered in ED Medications  furosemide (LASIX) injection 40 mg (has no administration in time range)     Initial Impression / Assessment and Plan / ED Course  I have reviewed the triage vital signs and the nursing notes.  Pertinent labs & imaging results that were available during my care of the patient were reviewed by me and considered in my medical decision making (see chart for details).     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  Patient has had increasing shortness of breath over the last 6 weeks.  Patient has not increased her Lasix.  She had 20 mg twice daily.  Patient has noticed a 30 pound weight gain.  And increasing shortness of breath.  She is noticed that the swelling started her ankles is moved up  to her abdomen.   Will initiate aggressive IV diuresis and admit to medicine.  Final Clinical Impressions(s) / ED Diagnoses   Final diagnoses:  None    ED Discharge Orders    None       Ahmir Bracken, Cindee Salt, MD  11/09/17 1555  

## 2017-11-10 ENCOUNTER — Inpatient Hospital Stay (HOSPITAL_COMMUNITY): Payer: Medicare Other

## 2017-11-10 ENCOUNTER — Other Ambulatory Visit: Payer: Self-pay

## 2017-11-10 ENCOUNTER — Telehealth: Payer: Self-pay

## 2017-11-10 DIAGNOSIS — I361 Nonrheumatic tricuspid (valve) insufficiency: Secondary | ICD-10-CM

## 2017-11-10 LAB — ECHOCARDIOGRAM COMPLETE
Height: 65 in
Weight: 4874.81 oz

## 2017-11-10 LAB — CBC WITH DIFFERENTIAL/PLATELET
Basophils Absolute: 0 10*3/uL (ref 0.0–0.1)
Basophils Relative: 1 %
EOS PCT: 3 %
Eosinophils Absolute: 0.2 10*3/uL (ref 0.0–0.7)
HCT: 38.7 % (ref 36.0–46.0)
Hemoglobin: 12 g/dL (ref 12.0–15.0)
LYMPHS ABS: 1.8 10*3/uL (ref 0.7–4.0)
LYMPHS PCT: 28 %
MCH: 32.2 pg (ref 26.0–34.0)
MCHC: 31 g/dL (ref 30.0–36.0)
MCV: 103.8 fL — AB (ref 78.0–100.0)
MONO ABS: 0.6 10*3/uL (ref 0.1–1.0)
MONOS PCT: 10 %
Neutro Abs: 3.6 10*3/uL (ref 1.7–7.7)
Neutrophils Relative %: 58 %
PLATELETS: 175 10*3/uL (ref 150–400)
RBC: 3.73 MIL/uL — ABNORMAL LOW (ref 3.87–5.11)
RDW: 17.1 % — AB (ref 11.5–15.5)
WBC: 6.2 10*3/uL (ref 4.0–10.5)

## 2017-11-10 LAB — BASIC METABOLIC PANEL
Anion gap: 10 (ref 5–15)
BUN: 13 mg/dL (ref 6–20)
CHLORIDE: 107 mmol/L (ref 101–111)
CO2: 25 mmol/L (ref 22–32)
Calcium: 9 mg/dL (ref 8.9–10.3)
Creatinine, Ser: 0.97 mg/dL (ref 0.44–1.00)
GFR calc Af Amer: >60 mL/min (ref >60)
GFR calc non Af Amer: >60 mL/min (ref >60)
GLUCOSE: 95 mg/dL (ref 65–99)
POTASSIUM: 3.5 mmol/L (ref 3.5–5.1)
Sodium: 142 mmol/L (ref 135–145)

## 2017-11-10 MED ORDER — LEVOTHYROXINE SODIUM 75 MCG PO TABS
150.0000 ug | ORAL_TABLET | Freq: Every day | ORAL | Status: DC
  Administered 2017-11-11 – 2017-11-20 (×10): 150 ug via ORAL
  Filled 2017-11-10 (×11): qty 2

## 2017-11-10 MED ORDER — POTASSIUM CHLORIDE CRYS ER 20 MEQ PO TBCR
20.0000 meq | EXTENDED_RELEASE_TABLET | Freq: Two times a day (BID) | ORAL | Status: DC
  Administered 2017-11-10 – 2017-11-14 (×9): 20 meq via ORAL
  Filled 2017-11-10: qty 2
  Filled 2017-11-10 (×3): qty 1
  Filled 2017-11-10: qty 2
  Filled 2017-11-10 (×5): qty 1

## 2017-11-10 MED ORDER — TRAMADOL HCL 50 MG PO TABS
50.0000 mg | ORAL_TABLET | Freq: Four times a day (QID) | ORAL | Status: AC | PRN
  Administered 2017-11-10 – 2017-11-11 (×2): 50 mg via ORAL
  Filled 2017-11-10 (×2): qty 1

## 2017-11-10 NOTE — Progress Notes (Signed)
Patient unavailable at this time due to patient care; will attempt to check back with patient regarding physical therapy recommendationsof Home Health  PT and DME: Bariatric rolling walker.

## 2017-11-10 NOTE — Evaluation (Signed)
Physical Therapy Evaluation Patient Details Name: Christine Hicks MRN: [id number] DOB: 01/06/1956 Today's Date: 11/10/2017   History of Present Illness  Patient is a 62 y.o. F with significant PMH of hypothyroidism, chronic diastolic dysfunction, AVR in mid 2018, and morbid obesity presenting with chest pain. Has been retaining fluid with 30-40 pound weight gain in recent 3-4 months.    Clinical Impression  Pt admitted with above diagnosis. Pt currently with functional limitations due to the deficits listed below (see PT Problem List). At the time of PT evaluation, patient presenting with decreased functional mobility secondary to generalized weakness, decreased balance, shortness of breath, morbid obesity and limited endurance. Patient requiring increased assist for bed mobility (up to max assist for supine to sit), but is able to ambulate 200 feet with RW and min guard. Pt will benefit from skilled PT to increase their independence and safety with mobility to allow discharge to the venue listed below.       Follow Up Recommendations Home health PT;Supervision for mobility/OOB    Equipment Recommendations  Other (comment)(Bariatric rolling walker )    Recommendations for Other Services       Precautions / Restrictions Precautions Precautions: Fall Restrictions Weight Bearing Restrictions: No      Mobility  Bed Mobility Overal bed mobility: Needs Assistance Bed Mobility: Supine to Sit;Sit to Supine     Supine to sit: Supervision Sit to supine: Max assist   General bed mobility comments: Patient able to perform supine to sit with supervision, however, with sit to supine, requiring maxA for BLE management. Patient unable to elevate BLE's back onto bed.   Transfers Overall transfer level: Needs assistance Equipment used: Rolling walker (2 wheeled) Transfers: Sit to/from Stand Sit to Stand: Min assist;Mod assist         General transfer comment: Patient requiring min-mod  assist to power up from bed to RW. VC's for hand placement  Ambulation/Gait Ambulation/Gait assistance: Min guard Ambulation Distance (Feet): 200 Feet Assistive device: Rolling walker (2 wheeled) Gait Pattern/deviations: Step-through pattern;Wide base of support Gait velocity: decreased   General Gait Details: Patient requiring multiple standing rest breaks. Incorporating a wide BOS and decreased knee flexion secondary to soft tissue restrictions.   Stairs            Wheelchair Mobility    Modified Rankin (Stroke Patients Only)       Balance Overall balance assessment: Needs assistance Sitting-balance support: No upper extremity supported;Feet supported Sitting balance-Leahy Scale: Good     Standing balance support: Bilateral upper extremity supported Standing balance-Leahy Scale: Fair                               Pertinent Vitals/Pain Pain Assessment: Faces Faces Pain Scale: Hurts little more Pain Location: L medial thigh Pain Descriptors / Indicators: Cramping Pain Intervention(s): Limited activity within patient's tolerance;Monitored during session;Repositioned    Home Living Family/patient expects to be discharged to:: Private residence Living Arrangements: Spouse/significant other;Children(Son) Available Help at Discharge: Family;Available 24 hours/day Type of Home: House Home Access: Level entry     Home Layout: One level Home Equipment: Walker - 2 wheels;Cane - single point;Bedside commode;Shower seat Additional Comments: Patient reports RW is very old and unstable    Prior Function Level of Independence: Independent with assistive device(s)         Comments: Uses RW recently; enjoys going on walks with her son     Hand Dominance  Extremity/Trunk Assessment   Upper Extremity Assessment Upper Extremity Assessment: Generalized weakness    Lower Extremity Assessment Lower Extremity Assessment: Generalized weakness(LLE  weaker than RLE)       Communication   Communication: No difficulties  Cognition Arousal/Alertness: Awake/alert Behavior During Therapy: WFL for tasks assessed/performed Overall Cognitive Status: Within Functional Limits for tasks assessed                                        General Comments General comments (skin integrity, edema, etc.): Generalized edema, R foot > L foot. Patient's sons present during treatment.    Exercises     Assessment/Plan    PT Assessment Patient needs continued PT services  PT Problem List Decreased strength;Decreased range of motion;Decreased activity tolerance;Decreased balance;Decreased mobility       PT Treatment Interventions DME instruction;Gait training;Functional mobility training;Therapeutic activities;Therapeutic exercise;Balance training;Patient/family education    PT Goals (Current goals can be found in the Care Plan section)  Acute Rehab PT Goals Patient Stated Goal: go for walks with her son PT Goal Formulation: With patient Time For Goal Achievement: 11/24/17 Potential to Achieve Goals: Good    Frequency Min 3X/week   Barriers to discharge        Co-evaluation               AM-PAC PT "6 Clicks" Daily Activity  Outcome Measure Difficulty turning over in bed (including adjusting bedclothes, sheets and blankets)?: A Lot Difficulty moving from lying on back to sitting on the side of the bed? : A Lot Difficulty sitting down on and standing up from a chair with arms (e.g., wheelchair, bedside commode, etc,.)?: Unable Help needed moving to and from a bed to chair (including a wheelchair)?: A Little Help needed walking in hospital room?: A Little Help needed climbing 3-5 steps with a railing? : A Lot 6 Click Score: 13    End of Session Equipment Utilized During Treatment: Gait belt Activity Tolerance: Patient tolerated treatment well Patient left: in bed;with call bell/phone within reach;with  family/visitor present Nurse Communication: Mobility status PT Visit Diagnosis: Unsteadiness on feet (R26.81);Muscle weakness (generalized) (M62.81);Difficulty in walking, not elsewhere classified (R26.2)    Time: 4098-1191 PT Time Calculation (min) (ACUTE ONLY): 29 min   Charges:   PT Evaluation $PT Eval Moderate Complexity: 1 Mod PT Treatments $Gait Training: 8-22 mins   PT G Codes:        Laurina Bustle, PT, DPT Acute Rehabilitation Services  Pager: [phone]   Vanetta Mulders 11/10/2017, 12:58 PM

## 2017-11-10 NOTE — Progress Notes (Signed)
[facility] Progress Note  Patient: Christine Hicks ZOX:[id number]   PCP: Billee Cashing, MD DOB: 20-May-1956   DOA: 11/09/2017   DOS: 11/10/2017   Date of Service: the patient was seen and examined on 11/10/2017  Subjective: Complains about having muscle cramps in the leg.  Breathing is better but no chest pain no abdominal pain but no nausea no vomiting.  Brief hospital course: Pt. with PMH of morbid obesity, hypothyroidism, chronic diastolic CHF, aortic valve replacement; admitted on 11/09/2017, presented with complaint of shortness of breath and swelling, was found to have acute on chronic diastolic CHF. Currently further plan is to IV diuresis.  Assessment and Plan: Anasarca/acute on chronic diastolic heart failure -Patient with marked morbid obesity presenting with worsening SOB and anasarca -CXR consistent with pulmonary edema -Elevated BNP -With elevated BNP and abnl CXR, CHF seems most probable as diagnosis, echo shows preserved EF no wall motion of normality. -Will continue ASA -Will start Lisinopril 5 mg daily (BP is normal to high and so should support addition of medication) -Continue beta blocker -Was given Lasix 40 mg x 1 in ER and will continue with 60 mg IV BID -Continue [place] O2 for now -Normal kidney function at this time, will follow  Morbid obesity -Patient has marked obesity making it difficult to fully ascertain what is her baseline habitus vs. Edema -She also has had decreased mobility as a result of the worsening edema, which may also have worsened her obesity as well as her functional status Physical therapy evaluation.  HTN -Continue Lopressor -Add lisinopril  Hypothyroidism -Check TSH - normal in 6/18 -Continue Synthroid at current dose for now  S/p AVR  Outpatient follow-up with cardiology. According to him shows no significant worsening of the valve structure at present.  History of recurrent DVT. On chronic long-term medical regulation  with Eliquis. -Continue Eliquis, although patient's weight of 138 kg is significantly higher than study population.  Recommend outpatient follow-up with PCP as well as cardiology to discuss changing to Coumadin.  Diet: Cardiac diet DVT Prophylaxis: on therapeutic anticoagulation.  Advance goals of care discussion: full code  Family Communication: no family was present at bedside, at the time of interview.   Disposition:  Discharge to home.  Consultants: none Procedures: Echocardiogram   Antibiotics: Anti-infectives (From admission, onward)   None       Objective: Physical Exam: Vitals:   11/09/17 2243 11/10/17 0022 11/10/17 0334 11/10/17 0337  BP: 97/68 104/61 117/77 117/71  Pulse: (!) 103 70 89 70  Resp:  18 18 18   Temp:  98 F (36.7 C) 98 F (36.7 C) 98 F (36.7 C)  TempSrc:  Oral Oral Oral  SpO2:  100% 100% 100%  Weight:    (!) 138.2 kg (304 lb 10.8 oz)  Height:        Intake/Output Summary (Last 24 hours) at 11/10/2017 1713 Last data filed at 11/10/2017 1528 Gross per 24 hour  Intake 240 ml  Output 1400 ml  Net -1160 ml   Filed Weights   11/09/17 1656 11/10/17 0337  Weight: (!) 138.6 kg (305 lb 8.9 oz) (!) 138.2 kg (304 lb 10.8 oz)   General: Alert, Awake and Oriented to Time, Place and Person. Appear in no distress, affect appropriateflat Eyes: PERRL, Conjunctiva normal ENT: Oral Mucosa clear moist. Neck: difficult to assess JVD, no Abnormal Mass Or lumps Cardiovascular: S1 and S2 Present, no Murmur, Peripheral Pulses Present Respiratory: normal respiratory effort, Bilateral Air entry equal and Decreased, no  use of accessory muscle, Clear to Auscultation, no Crackles, no wheezes Abdomen: Bowel Sound present, Soft and no tenderness, no hernia Skin: no redness, no Rash, no induration Extremities: bilateral  Pedal edema, no calf tenderness Neurologic: Grossly no focal neuro deficit. Bilaterally Equal motor strength Data Reviewed: CBC: Recent Labs  Lab  11/09/17 1043 11/10/17 0634  WBC 6.1 6.2  NEUTROABS  --  3.6  HGB 12.4 12.0  HCT 40.1 38.7  MCV 104.7* 103.8*  PLT 173 175   Basic Metabolic Panel: Recent Labs  Lab 11/09/17 1043 11/10/17 0634  NA 140 142  K 3.8 3.5  CL 107 107  CO2 22 25  GLUCOSE 145* 95  BUN 16 13  CREATININE 1.05* 0.97  CALCIUM 9.0 9.0    Liver Function Tests: No results for input(s): AST, ALT, ALKPHOS, BILITOT, PROT, ALBUMIN in the last 168 hours. No results for input(s): LIPASE, AMYLASE in the last 168 hours. No results for input(s): AMMONIA in the last 168 hours. Coagulation Profile: Recent Labs  Lab 11/09/17 1248  INR 2.04   Cardiac Enzymes: No results for input(s): CKTOTAL, CKMB, CKMBINDEX, TROPONINI in the last 168 hours. BNP (last 3 results) No results for input(s): PROBNP in the last 8760 hours. CBG: No results for input(s): GLUCAP in the last 168 hours. Studies: No results found.  Scheduled Meds: . apixaban  5 mg Oral BID  . aspirin EC  81 mg Oral Daily  . furosemide  60 mg Intravenous Q12H  . levothyroxine  150 mcg Oral QAC breakfast  . lisinopril  2.5 mg Oral Daily  . metoprolol tartrate  25 mg Oral BID  . pantoprazole  40 mg Oral Daily  . potassium chloride  20 mEq Oral BID  . sodium chloride flush  3 mL Intravenous Q12H   Continuous Infusions: . sodium chloride     PRN Meds: sodium chloride, acetaminophen, ondansetron (ZOFRAN) IV, sodium chloride flush  Time spent: 35 minutes  Author: Lynden OxfordPranav Shevelle Smither, MD [facility] Pager: [phone] 11/10/2017 5:13 PM  If 7PM-7AM, please contact night-coverage at [email or web address], password [email or web address]

## 2017-11-10 NOTE — Progress Notes (Signed)
  Echocardiogram 2D Echocardiogram has been performed.  Christine Hicks 11/10/2017, 2:23 PM

## 2017-11-10 NOTE — Progress Notes (Signed)
Per family, patient ambulates at home with assistance of a walker. Patient refusing to get up during hospital admission. States she is to weak to stand. Weight obtained via bed scale. PT consult placed for patient to be evaluated. Will continue to monitor patient.

## 2017-11-10 NOTE — Telephone Encounter (Signed)
Met with the patient to discuss plans for follow up care after discharge. Explained the services provided at [facility] including primary care, pharmacy, social work and financial counseling as well as the [employer].  She stated that she knows that she will need to find a PCP and she will consider [place]. Provided her with the contact information for [facility].  Update provided to Priscille Heidelberg, RN CM

## 2017-11-11 LAB — BASIC METABOLIC PANEL
Anion gap: 10 (ref 5–15)
BUN: 14 mg/dL (ref 6–20)
CHLORIDE: 106 mmol/L (ref 101–111)
CO2: 26 mmol/L (ref 22–32)
CREATININE: 1.05 mg/dL — AB (ref 0.44–1.00)
Calcium: 8.9 mg/dL (ref 8.9–10.3)
GFR calc Af Amer: >60 mL/min (ref >60)
GFR calc non Af Amer: 55 mL/min — ABNORMAL LOW (ref >60)
Glucose, Bld: 106 mg/dL — ABNORMAL HIGH (ref 65–99)
POTASSIUM: 3.4 mmol/L — AB (ref 3.5–5.1)
SODIUM: 142 mmol/L (ref 135–145)

## 2017-11-11 MED ORDER — FUROSEMIDE 10 MG/ML IJ SOLN: 60.0000 mg | Freq: Two times a day (BID) | INTRAMUSCULAR | Status: DC

## 2017-11-11 MED ORDER — FUROSEMIDE 10 MG/ML IJ SOLN
60.0000 mg | Freq: Every day | INTRAMUSCULAR | Status: DC
  Administered 2017-11-11 – 2017-11-12 (×2): 60 mg via INTRAVENOUS
  Filled 2017-11-11 (×2): qty 6

## 2017-11-11 MED ORDER — TRAMADOL HCL 50 MG PO TABS
50.0000 mg | ORAL_TABLET | Freq: Two times a day (BID) | ORAL | Status: DC | PRN
  Administered 2017-11-11 – 2017-11-19 (×9): 50 mg via ORAL
  Filled 2017-11-11 (×9): qty 1

## 2017-11-11 MED ORDER — FERROUS SULFATE 325 (65 FE) MG PO TABS
325.0000 mg | ORAL_TABLET | Freq: Two times a day (BID) | ORAL | Status: DC
  Administered 2017-11-11: 325 mg via ORAL
  Filled 2017-11-11: qty 1

## 2017-11-11 NOTE — Progress Notes (Signed)
Physical Therapy Treatment Patient Details Name: Christine Hicks MRN: [id number] DOB: 1956/05/28 Today's Date: 11/11/2017    History of Present Illness Patient is a 62 y.o. F with significant PMH of hypothyroidism, chronic diastolic dysfunction, AVR in mid 2018, and morbid obesity presenting with chest pain. Has been retaining fluid with 30-40 pound weight gain in recent 3-4 months.      PT Comments    Patient received sitting up at EOB, pleasant and willing to participate with PT today. She requires ModAx2 today for sit to stand due to B LE pain and weakness, however once up on her feet was able to ambulate approximately 21f today with multiple standing rest breaks. She was left up in the chair with all needs met, son present and RN aware of patient status.     Follow Up Recommendations  Home health PT;Supervision for mobility/OOB     Equipment Recommendations  Other (comment)(bariatric RW )    Recommendations for Other Services       Precautions / Restrictions Precautions Precautions: Fall Restrictions Weight Bearing Restrictions: No    Mobility  Bed Mobility               General bed mobility comments: DNT, received sitting up at side of bed   Transfers Overall transfer level: Needs assistance Equipment used: Rolling walker (2 wheeled) Transfers: Sit to/from Stand Sit to Stand: Mod assist;+2 physical assistance         General transfer comment: patient required ModAx2 for full transfer from sit to stand today   Ambulation/Gait Ambulation/Gait assistance: Supervision Ambulation Distance (Feet): 250 Feet Assistive device: Rolling walker (2 wheeled) Gait Pattern/deviations: Step-through pattern;Wide base of support Gait velocity: decreased   General Gait Details: continues to require mulitple standing rest breaks today, wide BOS and reduced gait speed noted    Stairs             Wheelchair Mobility    Modified Rankin (Stroke Patients Only)        Balance Overall balance assessment: Needs assistance Sitting-balance support: No upper extremity supported;Feet supported Sitting balance-Leahy Scale: Good     Standing balance support: Bilateral upper extremity supported Standing balance-Leahy Scale: Fair                              Cognition Arousal/Alertness: Awake/alert Behavior During Therapy: WFL for tasks assessed/performed Overall Cognitive Status: Within Functional Limits for tasks assessed                                        Exercises      General Comments General comments (skin integrity, edema, etc.): son present throughout treatment       Pertinent Vitals/Pain Pain Assessment: 0-10 Pain Location: B LEs  Pain Descriptors / Indicators: Aching;Discomfort Pain Intervention(s): Limited activity within patient's tolerance;Monitored during session;Repositioned    Home Living                      Prior Function            PT Goals (current goals can now be found in the care plan section) Acute Rehab PT Goals Patient Stated Goal: go for walks with her son PT Goal Formulation: With patient Time For Goal Achievement: 11/24/17 Potential to Achieve Goals: Good Progress towards PT goals: Progressing toward goals  Frequency    Min 3X/week      PT Plan Current plan remains appropriate    Co-evaluation              AM-PAC PT "6 Clicks" Daily Activity  Outcome Measure  Difficulty turning over in bed (including adjusting bedclothes, sheets and blankets)?: Unable Difficulty moving from lying on back to sitting on the side of the bed? : Unable Difficulty sitting down on and standing up from a chair with arms (e.g., wheelchair, bedside commode, etc,.)?: Unable Help needed moving to and from a bed to chair (including a wheelchair)?: A Little Help needed walking in hospital room?: A Little Help needed climbing 3-5 steps with a railing? : A Lot 6 Click  Score: 11    End of Session Equipment Utilized During Treatment: Gait belt Activity Tolerance: Patient tolerated treatment well Patient left: in chair;with call bell/phone within reach;with family/visitor present;with nursing/sitter in room   PT Visit Diagnosis: Unsteadiness on feet (R26.81);Muscle weakness (generalized) (M62.81);Difficulty in walking, not elsewhere classified (R26.2)     Time: 3299-2426 PT Time Calculation (min) (ACUTE ONLY): 23 min  Charges:  $Gait Training: 8-22 mins $Therapeutic Activity: 8-22 mins                    G Codes:       Deniece Ree PT, DPT, CBIS  Supplemental Physical Therapist [place]   Pager [phone]

## 2017-11-11 NOTE — Progress Notes (Signed)
[facility] Progress Note  Patient: Christine Hicks:[id number]   PCP: Billee CashingMcKenzie, [name], MD DOB: 04/03/1956   DOA: 11/09/2017   DOS: 11/11/2017   Date of Service: the patient was seen and examined on 11/11/2017  Subjective: Feeling better, breathing better.  No nausea no vomiting.  Still has swelling of the leg. Brief hospital course: Pt. with PMH of morbid obesity, hypothyroidism, chronic diastolic CHF, aortic valve replacement; admitted on 11/09/2017, presented with complaint of shortness of breath and swelling, was found to have acute on chronic diastolic CHF. Currently further plan is to IV diuresis.  Assessment and Plan: acute on chronic diastolic heart failure -Patient with marked morbid obesity presenting with worsening SOB and anasarca -CXR consistent with pulmonary edema -Elevated BNP -With elevated BNP and abnl CXR, CHF seems most probable as diagnosis, echo shows preserved EF no wall motion of normality. -Will continue ASA -Will start Lisinopril 5 mg daily (BP is normal to high and so should support addition of medication) -Continue beta blocker -Was given Lasix 40 mg x 1 in ER and transition to 60 mg IV BID, I will change it to daily -Continue [place] O2 for now -Monitor renal function  Morbid obesity Body mass index is 49.81 kg/Hicks.  -Patient has marked obesity making it difficult to fully ascertain what is her baseline habitus vs. Edema -She also has had decreased mobility as a result of the worsening edema, which may also have worsened her obesity as well as her functional status Physical therapy evaluation recommends home health  HTN -Continue Lopressor -Add lisinopril  Hypothyroidism -Check TSH - normal in 6/18 -Continue Synthroid at current dose for now  S/p AVR  Outpatient follow-up with cardiology. According to him shows no significant worsening of the valve structure at present.  History of recurrent DVT. On chronic long-term medical regulation with  Eliquis. -Continue Eliquis, although patient's weight of 138 kg is significantly higher than study population.  Recommend outpatient follow-up with PCP as well as cardiology to discuss changing to Coumadin.  Diet: Cardiac diet DVT Prophylaxis: on therapeutic anticoagulation.  Advance goals of care discussion: full code  Family Communication: no family was present at bedside, at the time of interview.   Disposition:  Discharge to home in 1-2 days  Consultants: none Procedures: Echocardiogram   Antibiotics: Anti-infectives (From admission, onward)   None       Objective: Physical Exam: Vitals:   11/11/17 0805 11/11/17 0919 11/11/17 1202 11/11/17 1420  BP: 111/73  (!) 113/57 (!) 110/51  Pulse: 90 73 61 61  Resp: 18  18   Temp: (!) 97.4 F (36.3 C)  98.2 F (36.8 C)   TempSrc: Oral  Oral   SpO2: 96%  100% 96%  Weight:      Height:        Intake/Output Summary (Last 24 hours) at 11/11/2017 1653 Last data filed at 11/11/2017 1503 Gross per 24 hour  Intake 1300 ml  Output 1500 ml  Net -200 ml   Filed Weights   11/09/17 1656 11/10/17 0337 11/11/17 0554  Weight: (!) 138.6 kg (305 lb 8.9 oz) (!) 138.2 kg (304 lb 10.8 oz) 135.8 kg (299 lb 4.8 oz)   General: Alert, Awake and Oriented to Time, Place and Person. Appear in no distress, affect appropriateflat Eyes: PERRL, Conjunctiva normal ENT: Oral Mucosa clear moist. Neck: difficult to assess JVD, no Abnormal Mass Or lumps Cardiovascular: S1 and S2 Present, no Murmur, Peripheral Pulses Present Respiratory: normal respiratory effort, Bilateral Air  entry equal and Decreased, no use of accessory muscle, Clear to Auscultation, no Crackles, no wheezes Abdomen: Bowel Sound present, Soft and no tenderness, no hernia Skin: no redness, no Rash, no induration Extremities: bilateral  Pedal edema, no calf tenderness Neurologic: Grossly no focal neuro deficit. Bilaterally Equal motor strength Data Reviewed: CBC: Recent Labs  Lab  11/09/17 1043 11/10/17 0634  WBC 6.1 6.2  NEUTROABS  --  3.6  HGB 12.4 12.0  HCT 40.1 38.7  MCV 104.7* 103.8*  PLT 173 175   Basic Metabolic Panel: Recent Labs  Lab 11/09/17 1043 11/10/17 0634 11/11/17 0435  NA 140 142 142  K 3.8 3.5 3.4*  CL 107 107 106  CO2 22 25 26   GLUCOSE 145* 95 106*  BUN 16 13 14   CREATININE 1.05* 0.97 1.05*  CALCIUM 9.0 9.0 8.9    Liver Function Tests: No results for input(s): AST, ALT, ALKPHOS, BILITOT, PROT, ALBUMIN in the last 168 hours. No results for input(s): LIPASE, AMYLASE in the last 168 hours. No results for input(s): AMMONIA in the last 168 hours. Coagulation Profile: Recent Labs  Lab 11/09/17 1248  INR 2.04   Cardiac Enzymes: No results for input(s): CKTOTAL, CKMB, CKMBINDEX, TROPONINI in the last 168 hours. BNP (last 3 results) No results for input(s): PROBNP in the last 8760 hours. CBG: No results for input(s): GLUCAP in the last 168 hours. Studies: No results found.  Scheduled Meds: . apixaban  5 mg Oral BID  . aspirin EC  81 mg Oral Daily  . ferrous sulfate  325 mg Oral BID WC  . furosemide  60 mg Intravenous Daily  . levothyroxine  150 mcg Oral QAC breakfast  . lisinopril  2.5 mg Oral Daily  . metoprolol tartrate  25 mg Oral BID  . pantoprazole  40 mg Oral Daily  . potassium chloride  20 mEq Oral BID  . sodium chloride flush  3 mL Intravenous Q12H   Continuous Infusions: . sodium chloride     PRN Meds: sodium chloride, acetaminophen, ondansetron (ZOFRAN) IV, sodium chloride flush  Time spent: 35 minutes  Author: Lynden Oxford, MD [facility] Pager: [phone] 11/11/2017 4:53 PM  If 7PM-7AM, please contact night-coverage at [email or web address], password [email or web address]

## 2017-11-11 NOTE — Progress Notes (Signed)
PT Cancellation Note  Patient Details Name: Christine Hicks MRN: [id number] DOB: 12/31/1955   Cancelled Treatment:    Reason Eval/Treat Not Completed: Patient declined, no reason specified Attempted to work with patient this morning, she is still eating breakfast and requests that PT return later in the day. Will try to return as/if schedule allows.    Nedra HaiKristen Nazier Neyhart PT, DPT, CBIS  Supplemental Physical Therapist [facility]   Pager [phone]

## 2017-11-11 NOTE — Care Management Note (Signed)
Case Management Note  Patient Details  Name: Christine Hicks MRN: [id number] Date of Birth: 04/25/1956  Subjective/Objective:          Spoke to patient at the bedside. She lives at home w her son and spouse. [facility] She does not have a PCP and is not currently active. She has used [facility] in the past but they are unable to start home visits until after patient establishes w a PCP. This may be at least 7-10 days to 30 days out. [facility] and establish care w Dr Crecencio [facility]. Patient also requested RW. Order placed.           Action/Plan:  Will need HH orders, and RW requested for delivery to room prior to DC.  Expected Discharge Date:  (unknown)               Expected Discharge Plan:  Home w Home Health Services  In-House Referral:  PCP / [facility]  Discharge planning Services  CM Consult  Post Acute Care Choice:  Home Health Choice offered to:  Patient  DME Arranged:  [facility] rolling DME Agency:  [facility]  HH Arranged:  PT, Nurse's Aide, [facility]  [facility]  Status of Service:  In process, will continue to follow  If discussed at Long Length of Stay Meetings, dates discussed:    Additional Comments:  Lawerance SabalDebbie Massiel Stipp, RN 11/11/2017, 2:07 PM

## 2017-11-12 LAB — BASIC METABOLIC PANEL
Anion gap: 8 (ref 5–15)
BUN: 14 mg/dL (ref 6–20)
CHLORIDE: 107 mmol/L (ref 101–111)
CO2: 25 mmol/L (ref 22–32)
CREATININE: 1.06 mg/dL — AB (ref 0.44–1.00)
Calcium: 8.6 mg/dL — ABNORMAL LOW (ref 8.9–10.3)
GFR, EST NON AFRICAN AMERICAN: 55 mL/min — AB (ref >60)
Glucose, Bld: 86 mg/dL (ref 65–99)
POTASSIUM: 3.5 mmol/L (ref 3.5–5.1)
SODIUM: 140 mmol/L (ref 135–145)

## 2017-11-12 LAB — CBC
HCT: 37.6 % (ref 36.0–46.0)
Hemoglobin: 11.6 g/dL — ABNORMAL LOW (ref 12.0–15.0)
MCH: 32 pg (ref 26.0–34.0)
MCHC: 30.9 g/dL (ref 30.0–36.0)
MCV: 103.9 fL — AB (ref 78.0–100.0)
Platelets: 169 10*3/uL (ref 150–400)
RBC: 3.62 MIL/uL — AB (ref 3.87–5.11)
RDW: 16.9 % — ABNORMAL HIGH (ref 11.5–15.5)
WBC: 5.5 10*3/uL (ref 4.0–10.5)

## 2017-11-12 MED ORDER — FUROSEMIDE 10 MG/ML IJ SOLN
80.0000 mg | Freq: Two times a day (BID) | INTRAMUSCULAR | Status: DC
  Administered 2017-11-12 – 2017-11-13 (×2): 80 mg via INTRAVENOUS
  Filled 2017-11-12 (×2): qty 8

## 2017-11-12 MED ORDER — FERROUS SULFATE 325 (65 FE) MG PO TABS
325.0000 mg | ORAL_TABLET | Freq: Two times a day (BID) | ORAL | Status: DC
  Administered 2017-11-12 – 2017-11-20 (×17): 325 mg via ORAL
  Filled 2017-11-12 (×17): qty 1

## 2017-11-12 MED ORDER — ZOLPIDEM TARTRATE 5 MG PO TABS
5.0000 mg | ORAL_TABLET | Freq: Every evening | ORAL | Status: DC | PRN
  Administered 2017-11-12 – 2017-11-19 (×8): 5 mg via ORAL
  Filled 2017-11-12 (×8): qty 1

## 2017-11-12 NOTE — Progress Notes (Signed)
Pt request TED hose. Secretary placed order for TED hose.

## 2017-11-12 NOTE — Progress Notes (Signed)
PROGRESS NOTE  Christine Hicks OZH:[id number] DOB: 11/10/1955 DOA: 11/09/2017 PCP: Billee Cashing, MD   LOS: 3 days   Brief Narrative / Interim history: 62 year old female with morbid obesity, hypothyroidism, chronic diastolic CHF, status post aortic valve replacement, admitted on 4/15 with about a 25-30  pound weight gain, shortness of breath and bilateral lower extremity swelling.  She was started on IV diuresis.  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    Acute on chronic diastolic CHF -Patient tells me that she normally weighs around 270, she is currently at 300 suggesting about a 30 pound weight gain in the last weeks -Chest x-ray on admission showed pulmonary edema, BNP was elevated and she has 2+ pitting lower extremity edema -Initially on Lasix 60 twice daily, changed to 60 daily today, patient without much weight loss, will increase to 80 IV twice daily; renal function is stable -Discontinue lisinopril as she is getting aggressive diuresis -Continue beta-blocker  Morbid obesity BMI around 50 -She is actively trying to lose weight, undergoing dietary changes.  Encouraged to continue to do so  Hypertension -Continue Lopressor, hold lisinopril  Hypothyroidism -TSH pending, continue Synthroid  History of recurrent DVTs -Continue Eliquis  Status post aVR -Outpatient follow-up, echo reassuring  Probable obstructive sleep apnea -Patient with symptoms consistent with this, recommend outpatient study  Pulmonary hypertension -diurese, sleep study for OSA   DVT prophylaxis: Eliquis Code Status: Full code Family Communication: son present in the room Disposition Plan: home when ready  Consultants:   None   Procedures:   2D echo:   Impressions: - The right ventricular systolic pressure was increased consistent with moderate pulmonary hypertension.   Antimicrobials:  None    Subjective: -Doing better, improved however still not feeling back to baseline, still somewhat short of breath with activity.  Feels like her legs are not as swollen  Objective: Vitals:   11/11/17 2153 11/12/17 0530 11/12/17 0720 11/12/17 0900  BP: 93/61  (!) 90/52 95/63  Pulse:   63 74  Resp:      Temp:   (!) 97.5 F (36.4 C)   TempSrc:   Oral   SpO2:   96%   Weight:  (!) 136.9 kg (301 lb 14.4 oz)    Height:        Intake/Output Summary (Last 24 hours) at 11/12/2017 1208 Last data filed at 11/12/2017 1039 Gross per 24 hour  Intake 980 ml  Output 1355 ml  Net -375 ml   Filed Weights   11/10/17 0337 11/11/17 0554 11/12/17 0530  Weight: (!) 138.2 kg (304 lb 10.8 oz) 135.8 kg (299 lb 4.8 oz) (!) 136.9 kg (301 lb 14.4 oz)    Examination:  Constitutional: NAD Eyes: PERRL, lids and conjunctivae normal ENMT: Mucous membranes are moist.  Neck: normal, supple Respiratory: Overall decreased breath sounds due to morbid obesity, no wheezing, faint crackles heard at the bases  Cardiovascular: Regular rate and rhythm, no murmurs / rubs / gallops.  2+ pitting LE edema. 2+ pedal pulses. Abdomen: no tenderness. Bowel sounds positive.  Skin: no rashes, chronic venous stasis changes bilateral lower extremities Neurologic: CN 2-12 grossly intact. Strength 5/5 in all 4.  Psychiatric: Normal judgment and insight. Alert and oriented x 3. Normal mood.    Data  Reviewed: I have independently reviewed following labs and imaging studies   CBC: Recent Labs  Lab 11/09/17 1043 11/10/17 0634 11/12/17 0416  WBC 6.1 6.2 5.5  NEUTROABS  --  3.6  --   HGB 12.4 12.0 11.6*  HCT 40.1 38.7 37.6  MCV 104.7* 103.8* 103.9*  PLT 173 175 169   Basic Metabolic Panel: Recent Labs  Lab 11/09/17 1043 11/10/17 0634 11/11/17 0435 11/12/17 0416  NA 140  142 142 140  K 3.8 3.5 3.4* 3.5  CL 107 107 106 107  CO2 22 25 26 25   GLUCOSE 145* 95 106* 86  BUN 16 13 14 14   CREATININE 1.05* 0.97 1.05* 1.06*  CALCIUM 9.0 9.0 8.9 8.6*   GFR: Estimated Creatinine Clearance: 76.3 mL/min (A) (by C-G formula based on SCr of 1.06 mg/dL (H)). Liver Function Tests: No results for input(s): AST, ALT, ALKPHOS, BILITOT, PROT, ALBUMIN in the last 168 hours. No results for input(s): LIPASE, AMYLASE in the last 168 hours. No results for input(s): AMMONIA in the last 168 hours. Coagulation Profile: Recent Labs  Lab 11/09/17 1248  INR 2.04   Cardiac Enzymes: No results for input(s): CKTOTAL, CKMB, CKMBINDEX, TROPONINI in the last 168 hours. BNP (last 3 results) No results for input(s): PROBNP in the last 8760 hours. HbA1C: No results for input(s): HGBA1C in the last 72 hours. CBG: No results for input(s): GLUCAP in the last 168 hours. Lipid Profile: No results for input(s): CHOL, HDL, LDLCALC, TRIG, CHOLHDL, LDLDIRECT in the last 72 hours. Thyroid Function Tests: No results for input(s): TSH, T4TOTAL, FREET4, T3FREE, THYROIDAB in the last 72 hours. Anemia Panel: No results for input(s): VITAMINB12, FOLATE, FERRITIN, TIBC, IRON, RETICCTPCT in the last 72 hours. Urine analysis:    Component Value Date/Time   COLORURINE YELLOW 01/06/2017 2049   APPEARANCEUR CLEAR 01/06/2017 2049   LABSPEC 1.006 01/06/2017 2049   PHURINE 7.0 01/06/2017 2049   GLUCOSEU NEGATIVE 01/06/2017 2049   HGBUR NEGATIVE 01/06/2017 2049   BILIRUBINUR NEGATIVE 01/06/2017 2049   KETONESUR NEGATIVE 01/06/2017 2049   PROTEINUR NEGATIVE 01/06/2017 2049   UROBILINOGEN 0.2 06/22/2010 2055   NITRITE NEGATIVE 01/06/2017 2049   LEUKOCYTESUR NEGATIVE 01/06/2017 2049   Sepsis Labs: Invalid input(s): PROCALCITONIN, LACTICIDVEN  No results found for this or any previous visit (from the past 240 hour(s)).    Radiology Studies: No results found.   Scheduled Meds: . apixaban  5 mg  Oral BID  . aspirin EC  81 mg Oral Daily  . ferrous sulfate  325 mg Oral BID WC  . furosemide  60 mg Intravenous Daily  . levothyroxine  150 mcg Oral QAC breakfast  . lisinopril  2.5 mg Oral Daily  . metoprolol tartrate  25 mg Oral BID  . pantoprazole  40 mg Oral Daily  . potassium chloride  20 mEq Oral BID  . sodium chloride flush  3 mL Intravenous Q12H   Continuous Infusions: . sodium chloride      Pamella Pertostin Gherghe, MD, PhD [facility] Pager [phone]  If 7PM-7AM, please contact night-coverage [email or web address] Password [email or web address] 11/12/2017, 12:08 PM

## 2017-11-13 DIAGNOSIS — I5033 Acute on chronic diastolic (congestive) heart failure: Secondary | ICD-10-CM

## 2017-11-13 DIAGNOSIS — E039 Hypothyroidism, unspecified: Secondary | ICD-10-CM

## 2017-11-13 DIAGNOSIS — Z954 Presence of other heart-valve replacement: Secondary | ICD-10-CM

## 2017-11-13 DIAGNOSIS — I10 Essential (primary) hypertension: Secondary | ICD-10-CM

## 2017-11-13 DIAGNOSIS — R601 Generalized edema: Secondary | ICD-10-CM

## 2017-11-13 LAB — BASIC METABOLIC PANEL
ANION GAP: 9 (ref 5–15)
BUN: 15 mg/dL (ref 6–20)
CHLORIDE: 107 mmol/L (ref 101–111)
CO2: 26 mmol/L (ref 22–32)
Calcium: 8.9 mg/dL (ref 8.9–10.3)
Creatinine, Ser: 1.06 mg/dL — ABNORMAL HIGH (ref 0.44–1.00)
GFR calc non Af Amer: 55 mL/min — ABNORMAL LOW (ref >60)
Glucose, Bld: 82 mg/dL (ref 65–99)
Potassium: 3.8 mmol/L (ref 3.5–5.1)
Sodium: 142 mmol/L (ref 135–145)

## 2017-11-13 LAB — CBC
HEMATOCRIT: 38.6 % (ref 36.0–46.0)
HEMOGLOBIN: 11.7 g/dL — AB (ref 12.0–15.0)
MCH: 31.7 pg (ref 26.0–34.0)
MCHC: 30.3 g/dL (ref 30.0–36.0)
MCV: 104.6 fL — ABNORMAL HIGH (ref 78.0–100.0)
Platelets: 168 10*3/uL (ref 150–400)
RBC: 3.69 MIL/uL — AB (ref 3.87–5.11)
RDW: 16.6 % — ABNORMAL HIGH (ref 11.5–15.5)
WBC: 5.4 10*3/uL (ref 4.0–10.5)

## 2017-11-13 LAB — TSH: TSH: 1.174 u[IU]/mL (ref 0.350–4.500)

## 2017-11-13 LAB — PROTEIN / CREATININE RATIO, URINE
CREATININE, URINE: 98.16 mg/dL
Protein Creatinine Ratio: 0.39 mg/mg{Cre} — ABNORMAL HIGH (ref 0.00–0.15)
TOTAL PROTEIN, URINE: 38 mg/dL

## 2017-11-13 MED ORDER — METOLAZONE 2.5 MG PO TABS
2.5000 mg | ORAL_TABLET | Freq: Every day | ORAL | Status: DC
  Administered 2017-11-13 – 2017-11-14 (×2): 2.5 mg via ORAL
  Filled 2017-11-13 (×3): qty 1

## 2017-11-13 MED ORDER — FUROSEMIDE 10 MG/ML IJ SOLN
80.0000 mg | Freq: Three times a day (TID) | INTRAMUSCULAR | Status: DC
  Administered 2017-11-13 – 2017-11-16 (×8): 80 mg via INTRAVENOUS
  Filled 2017-11-13 (×8): qty 8

## 2017-11-13 NOTE — Care Management Important Message (Signed)
Important Message  Patient Details  Name: Archie PattenJudith M Blaker MRN: [id number] Date of Birth: 11/14/1955   Medicare Important Message Given:  Yes    Dorena BodoIris Mirelle Biskup 11/13/2017, 12:07 PM

## 2017-11-13 NOTE — Progress Notes (Addendum)
PROGRESS NOTE  Christine PattenJudith M Takemoto  WUJ:[id number] DOB: 06/28/1956 DOA: 11/09/2017 PCP: Billee CashingMcKenzie, [name], MD  Outpatient Specialists: Cardiology, Katrinka BlazingSmith Brief Narrative: Christine Hicks is a 62 y.o. female with a history of chronic HFpEF, s/p AVR, hypothyroidism and morbid obesity (BMI ~50) who presented 4/15 with anasarca despite compliance with heart healthy diet and lasix at home. She noted ~30 lbs weight gain, dyspnea at rest, and increasing bilateral lower extremity and abdominal swelling. She was admitted, started on IV lasix. Weight has remained 305 > 302, significantly about EDW (office visit weights 260-270's lbs). Dyspnea and leg swelling continue.   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 Suspect po lasix was no longer being absorbed at home. Chest x-ray on admission showed pulmonary edema, BNP was elevated and she has 2+ pitting lower extremity edema.  - Repeat echo with preserved EF, no mention of diastolic dysfunction; has pulm HTN/increased RV systolic pressure.  - Initially on Lasix 60mg  IV BID, changed to 60 daily today, patient without much weight loss so increased to 80mg  IV BID. Weight not coming off as would be anticipated, and remains grossly volume overloaded so will ask for cardiology assistance.  - Lisinopril held with aggressive diuresis.  - Continue beta blocker - With significant anasarca, will also check LFTs, urine protein  Morbid obesity: BMI ~50.  - Compliant with dietary modifications at home.  - Nutrition consulted  Hypertension - Continuing metoprolol, holding lisinopril  Hypothyroidism: TSH 1.174 -  Continue stable dose synthroid  History of DVTs: Current swelling also involves abdomen, do not suspect LE recurrence with anticoagulation adherence. - Continue eliquis  Status post aVR: Echo reassuring.  - Outpatient follow up  Probable obstructive sleep apnea - Patient with symptoms consistent with this, recommend outpatient study  Pulmonary hypertension - Diurese, sleep study for OSA  DVT prophylaxis: Eliquis Code Status: Full Family Communication: None at bedside this AM Disposition Plan: Home when improved.   Consultants:   Cardiology  Procedures:   Echocardiogram 4/16:  - Left ventricle: The cavity size was normal. There was severe   focal basal and mild concentric hypertrophy. Systolic function   was normal. The estimated ejection fraction was in the range of   60% to 65%. Wall motion was normal; there were no regional wall   motion abnormalities. Left ventricular diastolic function   parameters were normal. - Aortic valve: Trileaflet; normal thickness, mildly calcified   leaflets. Valve area (VTI): 0.87 cm^2. Valve area (Vmax): 0.73   cm^2. Valve area (Vmean): 0.8 cm^2. - Left atrium: The atrium was moderately dilated. - Right atrium: The atrium was severely dilated. - Tricuspid valve: There was severe regurgitation. - Pulmonic valve: There was trivial regurgitation. - Pulmonary arteries: PA peak pressure: 46 mm Hg (S).  Impressions: - The right ventricular systolic pressure was increased consistent   with moderate pulmonary hypertension.  Antimicrobials:  None  Subjective: Dyspneic with exertion which is worse than her baseline. Swelling still significant in abdomen and legs. Reports improved UOP. No chest pain, abd pain, N/V/D.  Objective: Vitals:   11/12/17 0900 11/12/17 2120 11/13/17 0632 11/13/17 0635  BP: 95/63 (!) 91/57 140/63   Pulse:  74 70 68   Resp:  16 19   Temp:  98.2 F (36.8 C) 98.2 F (36.8 C)   TempSrc:  Oral Oral   SpO2:  96%  90%   Weight:    (!) 137.1 kg (302 lb 4.8 oz)  Height:        Intake/Output Summary (Last 24 hours) at 11/13/2017 1051 Last data filed at 11/13/2017 0950 Gross per 24 hour  Intake 480 ml  Output 1700 ml  Net -1220 ml   Filed Weights   11/11/17 0554 11/12/17 0530 11/13/17 0635  Weight: 135.8 kg (299 lb 4.8 oz) (!) 136.9 kg (301 lb 14.4 oz) (!) 137.1 kg (302 lb 4.8 oz)    Gen: Pleasant, obese female in no distress Pulm: Non-labored breathing. Distant but clear breath sounds.  CV: Regular rate and rhythm. No murmur, rub, or gallop. Unable to determine JVD, 2+ pitting LE edema. GI: Abdomen soft, obese, non-tender, non-distended, with normoactive bowel sounds. No organomegaly or masses felt. Ext: Warm, no deformities Skin: No rashes, lesions or ulcers Neuro: Alert and oriented. No focal neurological deficits. Psych: Judgement and insight appear normal. Mood & affect appropriate.   Data Reviewed: I have personally reviewed following labs and imaging studies  CBC: Recent Labs  Lab 11/09/17 1043 11/10/17 0634 11/12/17 0416 11/13/17 0413  WBC 6.1 6.2 5.5 5.4  NEUTROABS  --  3.6  --   --   HGB 12.4 12.0 11.6* 11.7*  HCT 40.1 38.7 37.6 38.6  MCV 104.7* 103.8* 103.9* 104.6*  PLT 173 175 169 168   Basic Metabolic Panel: Recent Labs  Lab 11/09/17 1043 11/10/17 0634 11/11/17 0435 11/12/17 0416 11/13/17 0413  NA 140 142 142 140 142  K 3.8 3.5 3.4* 3.5 3.8  CL 107 107 106 107 107  CO2 22 25 26 25 26   GLUCOSE 145* 95 106* 86 82  BUN 16 13 14 14 15   CREATININE 1.05* 0.97 1.05* 1.06* 1.06*  CALCIUM 9.0 9.0 8.9 8.6* 8.9   GFR: Estimated Creatinine Clearance: 76.3 mL/min (A) (by C-G formula based on SCr of 1.06 mg/dL (H)). Liver Function Tests: No results for input(s): AST, ALT, ALKPHOS, BILITOT, PROT, ALBUMIN in the last 168 hours. No results for input(s): LIPASE, AMYLASE in the last 168 hours. No results for input(s): AMMONIA in the last 168 hours. Coagulation  Profile: Recent Labs  Lab 11/09/17 1248  INR 2.04   Cardiac Enzymes: No results for input(s): CKTOTAL, CKMB, CKMBINDEX, TROPONINI in the last 168 hours. BNP (last 3 results) No results for input(s): PROBNP in the last 8760 hours. HbA1C: No results for input(s): HGBA1C in the last 72 hours. CBG: No results for input(s): GLUCAP in the last 168 hours. Lipid Profile: No results for input(s): CHOL, HDL, LDLCALC, TRIG, CHOLHDL, LDLDIRECT in the last 72 hours. Thyroid Function Tests: Recent Labs    11/13/17 0413  TSH 1.174   Anemia Panel: No results for input(s): VITAMINB12, FOLATE, FERRITIN, TIBC, IRON, RETICCTPCT in the last 72 hours. Urine analysis:    Component Value Date/Time   COLORURINE YELLOW 01/06/2017 2049   APPEARANCEUR CLEAR 01/06/2017 2049   LABSPEC 1.006 01/06/2017 2049   PHURINE 7.0 01/06/2017 2049   GLUCOSEU NEGATIVE 01/06/2017 2049   HGBUR NEGATIVE 01/06/2017 2049   BILIRUBINUR NEGATIVE 01/06/2017 2049   KETONESUR NEGATIVE 01/06/2017 2049   PROTEINUR NEGATIVE 01/06/2017 2049   UROBILINOGEN 0.2 06/22/2010 2055   NITRITE NEGATIVE 01/06/2017 2049   LEUKOCYTESUR NEGATIVE 01/06/2017 2049   No results  found for this or any previous visit (from the past 240 hour(s)).    Radiology Studies: No results found.  Scheduled Meds: . apixaban  5 mg Oral BID  . aspirin EC  81 mg Oral Daily  . ferrous sulfate  325 mg Oral BID WC  . furosemide  80 mg Intravenous Q12H  . levothyroxine  150 mcg Oral QAC breakfast  . metoprolol tartrate  25 mg Oral BID  . pantoprazole  40 mg Oral Daily  . potassium chloride  20 mEq Oral BID  . sodium chloride flush  3 mL Intravenous Q12H   Continuous Infusions: . sodium chloride       LOS: 4 days   Time spent: 25 minutes.  Hazeline Junker, MD [facility] Pager [phone]  If 7PM-7AM, please contact night-coverage [email or web address] Password [facility] 11/13/2017, 10:51 AM

## 2017-11-13 NOTE — Consult Note (Addendum)
Cardiology Consultation:   Patient ID: Christine Hicks; [id number]; 01/02/1956   Admit date: 11/09/2017 Date of Consult: 11/13/2017  Primary Care Provider: Billee CashingMcKenzie, [name], MD Primary Cardiologist: Lesleigh NoeHenry W Smith III, MD    Patient Profile:   Christine Hicks is a 62 y.o. female with a hx of  h/o congenital bicuspid aortic valve s/p porcine AVR + aortic root replacement in 2018, chronic diastolic dysfunction, h/o recurrent DVT, chronic anticoagulation w/ Eliquis, HTN, hypothyroidism, OSA and morbid obesity, who is being seen today for the evaluation of acute on chronic diastolic CHF at the request of Dr. Jarvis NewcomerGrunz, Internal Medicine.  History of Present Illness:   Christine Hicks is a 62 y/o female, followed by Dr. Katrinka BlazingSmith, with a h/o congenital bicuspid aortic valve that progressed to severe stenosis, status post aortic valve replacement with bioprosthesis along with aortic root replacement, chronic diastolic HF, h/o recurrent DVT now on chronic anticoagulation w/ Eliquis, hypothyroidism, OSA, morbid obesity, and HTN. Prior to valve replacement, she underwent a LHC which showed normal coronaries.   [facility] with complaints of weight gain, dyspnea, LEE and orthopnea. She notes progressive weight gain over the last 2 months, of nearly 30-40 lb. Her clothes and shoes have been fitting tighter. Also with orthopnea and PND. Admit BNP was only 316 (likley falsely low due to obesity). CXR showed congestive heart failure/fluid overload, w/ cardiomegaly, venous hypertension, mild interstitial edema and small effusions. EKG showed SR with RBBB. Admit BNP was 305 lb (268 lb at last OV 02/2017).  She was admitted by IM for acute on chronic diastolic HF with anasarca and placed on IV Lasix. BP was high and lisinopril added. BB continued. IV lasix started. 2D echo obtained and showed normal LVEF, 60-65%, normal wall motion, LA moderately dilated, RA severely dilated, RV systolic pressure was  increased consistent with moderate pulmonary HTN at 46 mm Hg. TSH normal. She has diuresed a total of 5.5. L since admit.  SCr 1.06 today. K 3.8. Weight is 302 lb today.   She is comfortable at rest but continues to have exertional dyspnea. She remains massively volume overloaded. She admits to some dietary indiscretion with sodium. She was taking lasix, but poor urinary response. She reports full compliance with Eliquis.     Past Medical History:  Diagnosis Date  . Acute on chronic diastolic congestive heart failure (HCC)   . Anemia, iron deficiency    negative egd/colonoscopy 11/16/2015  . Aortic stenosis, severe   . Arthritis   . Bicuspid aortic valve   . DVT (deep venous thrombosis) (HCC)    RLE DVT 11/12/15  . GERD (gastroesophageal reflux disease)   . Heart murmur   . Hypertension   . Insomnia   . Morbid obesity with BMI of 45.0-49.9, adult (HCC)   . PONV (postoperative nausea and vomiting)    took a long time to wake up  . 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 Thyroid disease   . Wears glasses   . Wears partial dentures     Past Surgical History:  Procedure Laterality Date  . 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 AORTIC VALVE REPLACEMENT N/A 01/01/2017   Procedure: MINIMALLY INVASIVE AORTIC VALVE REPLACEMENT (AVR);  Surgeon: Purcell Nailswen, Clarence H,  MD;  Location: [facility] OR;  Service: Open Heart Surgery;  Laterality: N/A;  . ASCENDING AORTIC ROOT REPLACEMENT N/A 01/01/2017   Procedure: ASCENDING AORTIC ROOT REPLACEMENT;  Surgeon: Purcell Nails, MD;  Location: [facility] OR;  Service: Open Heart Surgery;  Laterality: N/A;  . CARDIAC CATHETERIZATION     12/16/16  . COLONOSCOPY N/A 11/16/2015   Procedure: COLONOSCOPY;  Surgeon: Iva Boop, MD;  Location:  [facility] ENDOSCOPY;  Service: Endoscopy;  Laterality: N/A;  . ESOPHAGOGASTRODUODENOSCOPY N/A 11/16/2015   Procedure: ESOPHAGOGASTRODUODENOSCOPY (EGD);  Surgeon: Iva Boop, MD;  Location: [facility] ENDOSCOPY;  Service: Endoscopy;  Laterality: N/A;  . HERNIA REPAIR    . MULTIPLE TOOTH EXTRACTIONS    . Right leg cellulitis surgery    . TEE WITHOUT CARDIOVERSION N/A 01/01/2017   Procedure: TRANSESOPHAGEAL ECHOCARDIOGRAM (TEE);  Surgeon: Purcell Nails, MD;  Location: [facility] OR;  Service: Open Heart Surgery;  Laterality: N/A;     Home Medications:  Prior to Admission medications   Medication Sig Start Date End Date Taking? Authorizing Provider  aspirin EC 81 MG tablet Take 81 mg by mouth daily.   Yes [provider]  chlorhexidine (PERIDEX) 0.12 % solution Rinse with 15 mls twice daily for 30 seconds. Use after breakfast and at bedtime. Spit out excess. Do not swallow. 12/29/16  Yes Charlynne Pander, DDS  ELIQUIS 5 MG TABS tablet TAKE 1 TABLET BY MOUTH TWICE A DAY Patient taking differently: TAKE 1 TABLET (5mg ) BY MOUTH TWICE A DAY 07/27/17  Yes Lyn Records, MD  ferrous sulfate 325 (65 FE) MG tablet Take 1 tablet (325 mg total) by mouth 2 (two) times daily with a meal. 11/18/15  Yes Ghimire, Werner Lean, MD  furosemide (LASIX) 40 MG tablet Take 2 tablets by mouth in the a.m. And 1 tablet in the p m for 2 days then go back to 1 tablet by mouth twice a day Patient taking differently: Take 80 mg by mouth 2 (two) times daily.  01/26/17  Yes Leone Brand, NP  KLOR-CON M20 20 MEQ tablet TAKE 2 TABLETS BY MOUTH DAILY Patient taking differently: TAKE 2 TABLETS ( ) BY MOUTH DAILY 06/04/17  Yes Leone Brand, NP  levothyroxine (SYNTHROID, LEVOTHROID) 150 MCG tablet Take 1 tablet (150 mcg total) by mouth daily before breakfast. 12/18/16  Yes Berton Bon, NP  metoprolol tartrate (LOPRESSOR) 25 MG tablet Take 1 tablet (25 mg total) by mouth 2 (two) times daily. 01/10/17  Yes Barrett, Erin R, PA-C    pantoprazole (PROTONIX) 40 MG tablet Take 1 tablet (40 mg total) by mouth daily. 05/26/17  Yes Lyn Records, MD    Inpatient Medications: Scheduled Meds: . apixaban  5 mg Oral BID  . aspirin EC  81 mg Oral Daily  . ferrous sulfate  325 mg Oral BID WC  . furosemide  80 mg Intravenous Q12H  . levothyroxine  150 mcg Oral QAC breakfast  . metoprolol tartrate  25 mg Oral BID  . pantoprazole  40 mg Oral Daily  . potassium chloride  20 mEq Oral BID  . sodium chloride flush  3 mL Intravenous Q12H   Continuous Infusions: . sodium chloride     PRN Meds: sodium chloride, acetaminophen, ondansetron (ZOFRAN) IV, sodium chloride flush, traMADol, zolpidem  Allergies:    Allergies  Allergen Reactions  . No Known Allergies     Social History:   Social History   Socioeconomic History  . Marital status: Significant Other  Spouse name: Not on file  . Number of children: Not on file  . Years of education: Not on file  . Highest education level: Not on file  Occupational History  . Occupation: retired  Engineer, production  . Financial resource strain: Not on file  . Food insecurity:    Worry: Not on file    Inability: Not on file  . Transportation needs:    Medical: Not on file    Non-medical: Not on file  Tobacco Use  . Smoking status: Former Smoker    Types: Cigarettes  . Smokeless tobacco: Never Used  . Tobacco comment: quit smoking cigarettes > 25 years ago  Substance and Sexual Activity  . Alcohol use: No  . Drug use: No  . Sexual activity: Yes  Lifestyle  . Physical activity:    Days per week: Not on file    Minutes per session: Not on file  . Stress: Not on file  Relationships  . Social connections:    Talks on phone: Not on file    Gets together: Not on file    Attends religious service: Not on file    Active member of club or organization: Not on file    Attends meetings of clubs or organizations: Not on file    Relationship status: Not on file  . Intimate  partner violence:    Fear of current or ex partner: Not on file    Emotionally abused: Not on file    Physically abused: Not on file    Forced sexual activity: Not on file  Other Topics Concern  . Not on file  Social History Narrative  . Not on file    Family History:    Family History  Problem Relation Age of Onset  . Cardiomyopathy Son   . Heart attack Mother   . Emphysema Father   . Lung cancer Maternal Aunt      ROS:  Please see the history of present illness.   All other ROS reviewed and negative.     Physical Exam/Data:   Vitals:   11/12/17 2120 11/13/17 0632 11/13/17 0635 11/13/17 1206  BP: (!) 91/57 140/63  (!) 92/52  Pulse: 70 68  (!) 58  Resp: 16 19  18   Temp: 98.2 F (36.8 C) 98.2 F (36.8 C)  97.6 F (36.4 C)  TempSrc: Oral Oral  Oral  SpO2: 96% 90%  97%  Weight:   (!) 302 lb 4.8 oz (137.1 kg)   Height:        Intake/Output Summary (Last 24 hours) at 11/13/2017 1414 Last data filed at 11/13/2017 0950 Gross per 24 hour  Intake 240 ml  Output 1700 ml  Net -1460 ml   Filed Weights   11/11/17 0554 11/12/17 0530 11/13/17 0635  Weight: 299 lb 4.8 oz (135.8 kg) (!) 301 lb 14.4 oz (136.9 kg) (!) 302 lb 4.8 oz (137.1 kg)   Body mass index is 50.31 kg/m.  General:  Morbidly obese in no acute distress, mor HEENT: normal Lymph: no adenopathy Neck: no JVD Endocrine:  No thryomegaly Vascular: No carotid bruits; FA pulses 2+ bilaterally without bruits  Cardiac:  Distant heart sounds due to body habitus/ morbid obesity Lungs:  Distant lung sounds bilaterally due to body habitus/ morbid obesity Abd: obese/ anasarca  Ext: 3+ bilateral LEE with bilateral venous stasis dermatitis  Musculoskeletal:  No deformities, BUE and BLE strength normal and equal Skin: warm and dry  Neuro:  CNs 2-12 intact,  no focal abnormalities noted Psych:  Normal affect   EKG:  The EKG was personally reviewed and demonstrates:  NSR w/ RBBB Telemetry:  Telemetry was personally  reviewed and demonstrates:  NSR   Relevant CV Studies:  Procedures   Right Heart Cath and Coronary Angiography 12/16/16  Conclusion    Critical aortic stenosis, on a bicuspid aortic valve with documented greater than 120 mm peak to peak and 85 mm mean gradient by recent echo.  Normal coronary arteries.  Normal pulmonary pressures. The capillary wedge pressure is 14 mmHg mean.   Acute on chronic diastolic heart failure, resolved after inpatient diuresis over the past 4 days.     2D Echo 11/10/17 Study Conclusions  - Left ventricle: The cavity size was normal. There was severe   focal basal and mild concentric hypertrophy. Systolic function   was normal. The estimated ejection fraction was in the range of   60% to 65%. Wall motion was normal; there were no regional wall   motion abnormalities. Left ventricular diastolic function   parameters were normal. - Aortic valve: Trileaflet; normal thickness, mildly calcified   leaflets. Valve area (VTI): 0.87 cm^2. Valve area (Vmax): 0.73   cm^2. Valve area (Vmean): 0.8 cm^2. - Left atrium: The atrium was moderately dilated. - Right atrium: The atrium was severely dilated. - Tricuspid valve: There was severe regurgitation. - Pulmonic valve: There was trivial regurgitation. - Pulmonary arteries: PA peak pressure: 46 mm Hg (S).  Impressions:  - The right ventricular systolic pressure was increased consistent   with moderate pulmonary hypertension.   Laboratory Data:  Chemistry Recent Labs  Lab 11/11/17 0435 11/12/17 0416 11/13/17 0413  NA 142 140 142  K 3.4* 3.5 3.8  CL 106 107 107  CO2 26 25 26   GLUCOSE 106* 86 82  BUN 14 14 15   CREATININE 1.05* 1.06* 1.06*  CALCIUM 8.9 8.6* 8.9  GFRNONAA 55* 55* 55*  GFRAA >60 >60 >60  ANIONGAP 10 8 9     No results for input(s): PROT, ALBUMIN, AST, ALT, ALKPHOS, BILITOT in the last 168 hours. Hematology Recent Labs  Lab 11/10/17 0634 11/12/17 0416 11/13/17 0413  WBC 6.2  5.5 5.4  RBC 3.73* 3.62* 3.69*  HGB 12.0 11.6* 11.7*  HCT 38.7 37.6 38.6  MCV 103.8* 103.9* 104.6*  MCH 32.2 32.0 31.7  MCHC 31.0 30.9 30.3  RDW 17.1* 16.9* 16.6*  PLT 175 169 168   Cardiac EnzymesNo results for input(s): TROPONINI in the last 168 hours.  Recent Labs  Lab 11/09/17 1050  TROPIPOC 0.00    BNP Recent Labs  Lab 11/09/17 1213  BNP 316.0*    DDimer No results for input(s): DDIMER in the last 168 hours.  Radiology/Studies:  No results found.  Assessment and Plan:   Christine Hicks is a 62 y.o. female with a hx of  h/o congenital bicuspid aortic valve s/p porcine AVR + aortic root replacement in 2018, chronic diastolic dysfunction, h/o recurrent DVT, chronic anticoagulation w/ Eliquis, HTN, hypothyroidism, OSA and morbid obesity, who is being seen today for the evaluation of acute on chronic diastolic CHF at the request of Dr. Jarvis Newcomer, Internal Medicine.   1. Acute on Chronic Diastolic HF w/ Anasarca: EF normal by Echo. She is massively volume overloaded. 40 lb + progressive weight gain over the last 2 months, in the setting of dietary indiscretion with sodium. Last office weight 02/2017 was 268 lb. Admit weight 305 lb. She has 3+ bilateral LEE on exam and  abdominal edema. Distant lung sounds due to body habitus/ morbid obesity. BP is soft, in the low 90s systolic. Continue high dose IV Lasix. Currently getting 80 mg IV BID. May consider changing to TID dosing and may need to hold beta blocker to allow more room in BP to push diuretics. Continue to tract daily weights and continue w/ strict I/Os. We discussed importance of salt restriction and strict compliance with daily weights once discharged. Pt instructed to call the office for instruction if > 3 lb weight gain in 24 hrs or > 5lb in the course of 1 week. Once euvolemic, may considering changing PO diuretic regimen from Lasix to Torsemide for better GI absorption. We will continue to follow along closely w/ you.   2. H/o  Bioprosthetic Aortic Valve Replacement w/ Aortic Root Replacement: h/o congenital bicuspid valve w/ severe stenosis. Surgery done 12/2016 per Dr. Cornelius Moras. Echo this admit shows normally functioning valve. EF normal.   3. H/o Recurrent DVT: now on chronic anticoagulation w/ Eliquis. She reports full compliance.   4. HTN: BP is soft, while getting IV diuretics. May consider holding metoprolol to allow more room in BP to push diuretics. Continue to monitor closely.   5. Hypothyroidism: on synthroid. TSH WNL.   6. Morbid Obesity: Body mass index is 50.31 kg/m. Encourage lifestyle modification, healthy diet and exercise for weight loss.   For questions or updates, please contact [facility] Please consult [email or web address] for contact info under Cardiology/STEMI.   Signed, Robbie Lis, PA-C  11/13/2017 2:14 PM    The patient was seen, examined and discussed with Christine Hicks and I agree with the above.   62 y.o. female with a hx of  h/o congenital bicuspid aortic valve s/p porcine AVR + aortic root replacement in June 2018, chronic diastolic dysfunction, h/o recurrent DVT, chronic anticoagulation w/ Eliquis, HTN, hypothyroidism, OSA and morbid obesity She is being followed by Dr. Katrinka Blazing, last seen in August 2018, weight at the time 268 lbs.  Pt presented to [facility] ED on 11/09/17 with complaints of weight gain, dyspnea, LEE and orthopnea. She notes progressive weight gain over the last 2 months, of nearly 30-40 lb. Admit BNP was only 316 (likley falsely low due to obesity). CXR showed congestive heart failure/fluid overload, w/ cardiomegaly, venous hypertension, mild interstitial edema and small effusions. EKG showed SR with RBBB. Admit weigh was 305 lb (268 lb at last OV 02/2017).  Minimal diuresis in the last 3 days of 5 lbs, I would hold metoprolol, increase lasix to 80 mg iv Q8H and add metolazone 2.5 mg po daily. Crea normal, BP soft, we will follow.  I have reviewed her echo from  11/10/17 - LVEF normal and normally functioning aortic valve with normal transaortic gradients.   Tobias Alexander, MD 11/13/2017

## 2017-11-13 NOTE — Progress Notes (Signed)
Physical Therapy Treatment Patient Details Name: Christine Hicks MRN: [id number] DOB: 1956/07/04 Today's Date: 11/13/2017    History of Present Illness Patient is a 62 y.o. F with significant PMH of hypothyroidism, chronic diastolic dysfunction, AVR in mid 2018, and morbid obesity presenting with chest pain. Has been retaining fluid with 30-40 pound weight gain in recent 3-4 months.      PT Comments    Pt making steady progress with functional mobility, requiring less physical assistance this session with transfers. Pt continues to require standing rest breaks with ambulation secondary to fatigue. Pt would continue to benefit from skilled physical therapy services at this time while admitted and after d/c to address the below listed limitations in order to improve overall safety and independence with functional mobility.    Follow Up Recommendations  Home health PT;Supervision for mobility/OOB     Equipment Recommendations  Rolling walker with 5" wheels    Recommendations for Other Services       Precautions / Restrictions Precautions Precautions: Fall Restrictions Weight Bearing Restrictions: No    Mobility  Bed Mobility Overal bed mobility: Needs Assistance Bed Mobility: Supine to Sit;Sit to Supine     Supine to sit: Mod assist Sit to supine: Max assist   General bed mobility comments: increased time and effort, use of bed rails, assist with bilateral LEs onto and off of bed  Transfers Overall transfer level: Needs assistance Equipment used: Rolling walker (2 wheeled) Transfers: Sit to/from Stand Sit to Stand: From elevated surface;Min assist         General transfer comment: cueing for safe hand placement, min A to power into standing and for stability with transitional movement   Ambulation/Gait Ambulation/Gait assistance: Min guard Ambulation Distance (Feet): 200 Feet Assistive device: Rolling walker (2 wheeled) Gait Pattern/deviations: Step-through  pattern;Wide base of support Gait velocity: decreased Gait velocity interpretation: <1.31 ft/sec, indicative of household ambulator General Gait Details: pt required 3 brief standing rest breaks throughout ambulation; no instability or LOB, min guard for safety   Stairs             Wheelchair Mobility    Modified Rankin (Stroke Patients Only)       Balance Overall balance assessment: Needs assistance Sitting-balance support: No upper extremity supported;Feet supported Sitting balance-Leahy Scale: Good     Standing balance support: Bilateral upper extremity supported Standing balance-Leahy Scale: Poor                              Cognition Arousal/Alertness: Awake/alert Behavior During Therapy: WFL for tasks assessed/performed Overall Cognitive Status: Within Functional Limits for tasks assessed                                        Exercises      General Comments        Pertinent Vitals/Pain Pain Assessment: Faces Faces Pain Scale: Hurts little more Pain Location: B LEs  Pain Descriptors / Indicators: Sore Pain Intervention(s): Monitored during session;Repositioned    Home Living                      Prior Function            PT Goals (current goals can now be found in the care plan section) Acute Rehab PT Goals PT Goal Formulation: With patient Time  For Goal Achievement: 11/24/17 Potential to Achieve Goals: Good Progress towards PT goals: Progressing toward goals    Frequency    Min 3X/week      PT Plan Current plan remains appropriate    Co-evaluation              AM-PAC PT "6 Clicks" Daily Activity  Outcome Measure  Difficulty turning over in bed (including adjusting bedclothes, sheets and blankets)?: Unable Difficulty moving from lying on back to sitting on the side of the bed? : Unable Difficulty sitting down on and standing up from a chair with arms (e.g., wheelchair, bedside commode,  etc,.)?: Unable Help needed moving to and from a bed to chair (including a wheelchair)?: A Little Help needed walking in hospital room?: A Little Help needed climbing 3-5 steps with a railing? : A Lot 6 Click Score: 11    End of Session Equipment Utilized During Treatment: Gait belt Activity Tolerance: Patient tolerated treatment well Patient left: in bed;with call bell/phone within reach Nurse Communication: Mobility status PT Visit Diagnosis: Unsteadiness on feet (R26.81);Muscle weakness (generalized) (M62.81);Difficulty in walking, not elsewhere classified (R26.2)     Time: [id number] PT Time Calculation (min) (ACUTE ONLY): 17 min  Charges:  $Gait Training: 8-22 mins                    G Codes:       PitcairnJennifer Liann Spaeth, [place]PT, [place]PT [phone]    Alessandra BevelsJennifer M Shaheim Mahar 11/13/2017, 2:20 PM

## 2017-11-14 LAB — COMPREHENSIVE METABOLIC PANEL
ALT: 9 U/L — AB (ref 14–54)
AST: 20 U/L (ref 15–41)
Albumin: 3.2 g/dL — ABNORMAL LOW (ref 3.5–5.0)
Alkaline Phosphatase: 73 U/L (ref 38–126)
Anion gap: 12 (ref 5–15)
BUN: 16 mg/dL (ref 6–20)
CHLORIDE: 102 mmol/L (ref 101–111)
CO2: 26 mmol/L (ref 22–32)
CREATININE: 1.08 mg/dL — AB (ref 0.44–1.00)
Calcium: 8.9 mg/dL (ref 8.9–10.3)
GFR calc non Af Amer: 53 mL/min — ABNORMAL LOW (ref >60)
Glucose, Bld: 87 mg/dL (ref 65–99)
Potassium: 3.4 mmol/L — ABNORMAL LOW (ref 3.5–5.1)
Sodium: 140 mmol/L (ref 135–145)
Total Bilirubin: 1.3 mg/dL — ABNORMAL HIGH (ref 0.3–1.2)
Total Protein: 6.7 g/dL (ref 6.5–8.1)

## 2017-11-14 NOTE — Progress Notes (Signed)
Subjective:  Denies SSCP, palpitations dyspnea better   Objective:  Vitals:   11/14/17 0518 11/14/17 0533 11/14/17 1010 11/14/17 1223  BP: (!) 96/41  108/60 108/64  Pulse: 75  73 73  Resp:    18  Temp: 98.5 F (36.9 C)   97.8 F (36.6 C)  TempSrc: Oral   Oral  SpO2: 92%   92%  Weight:  293 lb 3.2 oz (133 kg)    Height:        Intake/Output from previous day:  Intake/Output Summary (Last 24 hours) at 11/14/2017 1329 Last data filed at 11/14/2017 1100 Gross per 24 hour  Intake 843 ml  Output 5600 ml  Net -4757 ml    Physical Exam: Affect appropriate Morbidly obese black female  HEENT: normal Neck supple with no adenopathy JVP normal no bruits no thyromegaly Lungs clear with no wheezing and good diaphragmatic motion Heart:  S1/S2SEM  murmur, no rub, gallop or click PMI normal Abdomen: benighn, BS positve, no tenderness, no AAA no bruit.  No HSM or HJR Distal pulses intact with no bruits Anasarca with plus 3 edema to abdomen  Neuro non-focal Skin warm and dry No muscular weakness   Lab Results: Basic Metabolic Panel: Recent Labs    11/13/17 0413 11/14/17 0424  NA 142 140  K 3.8 3.4*  CL 107 102  CO2 26 26  GLUCOSE 82 87  BUN 15 16  CREATININE 1.06* 1.08*  CALCIUM 8.9 8.9   Liver Function Tests: Recent Labs    11/14/17 0424  AST 20  ALT 9*  ALKPHOS 73  BILITOT 1.3*  PROT 6.7  ALBUMIN 3.2*   No results for input(s): LIPASE, AMYLASE in the last 72 hours. CBC: Recent Labs    11/12/17 0416 11/13/17 0413  WBC 5.5 5.4  HGB 11.6* 11.7*  HCT 37.6 38.6  MCV 103.9* 104.6*  PLT 169 168   Thyroid Function Tests: Recent Labs    11/13/17 0413  TSH 1.174   Anemia Panel: No results for input(s): VITAMINB12, FOLATE, FERRITIN, TIBC, IRON, RETICCTPCT in the last 72 hours.  Imaging: No results found.  Cardiac Studies:  ECG: SR RBBB no acute ST changes    Telemetry:  NSR 11/14/2017   Echo: reviewed EF 60-65% dilated atria severe TR    Medications:   . apixaban  5 mg Oral BID  . aspirin EC  81 mg Oral Daily  . ferrous sulfate  325 mg Oral BID WC  . furosemide  80 mg Intravenous Q8H  . levothyroxine  150 mcg Oral QAC breakfast  . metolazone  2.5 mg Oral Daily  . pantoprazole  40 mg Oral Daily  . potassium chloride  20 mEq Oral BID  . sodium chloride flush  3 mL Intravenous Q12H     . sodium chloride      Assessment/Plan:  Christine Hicks is a 62 y.o. female with a hx of  h/o congenital bicuspid aortic valve s/p porcine AVR + aortic root replacement in 2018, chronic diastolic dysfunction, h/o recurrent DVT, chronic anticoagulation w/ Eliquis, HTN, hypothyroidism, OSA and morbid obesity, who is being seen today for the evaluation of acute on chronic diastolic CHF at the request of Dr. Jarvis NewcomerGrunz, Internal Medicine.  CHF:  Diastolic much better diuresis 4L's with addition of metolazone. Continue iv q 8 lasix She will need at least Another 5 days of diuresis beta blocker held 4/19 to make room for additional diuresis   AVR:  Normal function  by echo SBE prophylaxis   DVT:  With elevated PA pressures contributes to right sided CHF on eliquis   Thyroid:  Continue replacement TSH normal   Total time spent reviewing chart, telemetry labs echo and Xrays examining patient and discussing plan of care 35 minutes   [name] 11/14/2017, 1:29 PM

## 2017-11-14 NOTE — Progress Notes (Signed)
PROGRESS NOTE  Christine Hicks  ZOX:[id number] DOB: 24-Jul-1956 DOA: 11/09/2017 PCP: Billee Cashing, MD  Outpatient Specialists: Cardiology, Katrinka Blazing Brief Narrative: Christine Hicks is a 62 y.o. female with a history of chronic HFpEF, s/p AVR, hypothyroidism and morbid obesity (BMI ~50) who presented 4/15 with anasarca despite compliance with heart healthy diet and lasix at home. She noted ~30 lbs weight gain, dyspnea at rest, and increasing bilateral lower extremity and abdominal swelling. She was admitted, started on IV lasix. Weight has remained 305 > 302, significantly about EDW (office visit weights 260-270's lbs). Dyspnea and leg swelling continue.   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 Suspect po lasix was no longer being absorbed at home. Chest x-ray on admission showed pulmonary edema, BNP was elevated and she has 2+ pitting lower extremity edema. Repeat echo with preserved EF, no mention of diastolic dysfunction; has pulm HTN/increased RV systolic pressure.  - Broke through with improved diuresis on lasix 80mg  IV TID + metolazone which we will continue today. Will need to continue diuresis and monitoring for several days. Appreciate cardiology assistance. - Lisinopril, metoprolol held with aggressive diuresis.  - Daily weights, strict I/O, daily BMP  Morbid obesity: BMI ~50.  - Compliant with dietary modifications at home.  - Nutrition consulted  Hypertension: Normotensive, holding BP meds to make room for aggressive diuresis.  - Holding lisinopril, metoprolol - On lasix as above  Hypothyroidism: TSH 1.174 -  Continue stable dose synthroid  History of DVTs: Current swelling also involves abdomen, do not suspect LE recurrence with anticoagulation adherence. - Continue eliquis  Status post aVR: Echo reassuring with normal function.  - Outpatient follow up - SBE prophylaxis   Probable obstructive sleep apnea - Patient with symptoms consistent with this, recommend outpatient study  Pulmonary hypertension - Diurese, sleep study for OSA  DVT prophylaxis: Eliquis Code Status: Full Family Communication: None at bedside this AM Disposition Plan: Home when improved. Will need protracted diuresis.  Consultants:   Cardiology  Procedures:   Echocardiogram 4/16:  - Left ventricle: The cavity size was normal. There was severe   focal basal and mild concentric hypertrophy. Systolic function   was normal. The estimated ejection fraction was in the range of   60% to 65%. Wall motion was normal; there were no regional wall   motion abnormalities. Left ventricular diastolic function   parameters were normal. - Aortic valve: Trileaflet; normal thickness, mildly calcified   leaflets. Valve area (VTI): 0.87 cm^2. Valve area (Vmax): 0.73   cm^2. Valve area (Vmean): 0.8 cm^2. - Left atrium: The atrium was moderately dilated. - Right atrium: The atrium was severely dilated. - Tricuspid valve: There was severe regurgitation. - Pulmonic valve: There was trivial regurgitation. - Pulmonary arteries: PA peak pressure: 46 mm Hg (S).  Impressions: - The right ventricular systolic pressure was increased consistent   with moderate pulmonary hypertension.  Antimicrobials:  None  Subjective: No chest pain or palpitations. Reports significant urination yesterday. Dyspnea improved.   Objective: Vitals:   11/14/17 0518 11/14/17 0533 11/14/17 1010 11/14/17 1223  BP: (!) 96/41  108/60 108/64  Pulse: 75  73 73  Resp:  18  Temp: 98.5 F (36.9 C)   97.8 F (36.6 C)  TempSrc: Oral   Oral  SpO2:  92%   92%  Weight:  133 kg (293 lb 3.2 oz)    Height:        Intake/Output Summary (Last 24 hours) at 11/14/2017 1337 Last data filed at 11/14/2017 1100 Gross per 24 hour  Intake 843 ml  Output 5600 ml  Net -4757 ml   Filed Weights   11/12/17 0530 11/13/17 0635 11/14/17 0533  Weight: (!) 136.9 kg (301 lb 14.4 oz) (!) 137.1 kg (302 lb 4.8 oz) 133 kg (293 lb 3.2 oz)   Gen: Pleasant, obese female in no distress Pulm: Non-labored breathing. Distant but clear breath sounds.  CV: Regular rate and rhythm. No murmur, rub, or gallop. Unable to determine JVD, 2+ pitting LE edema. GI: Abdomen soft, obese, non-tender, non-distended, with normoactive bowel sounds. No organomegaly or masses felt. Ext: Warm, no deformities Skin: Bilateral stasis dermatitis of lower legs. Neuro: Alert and oriented. No focal neurological deficits. Psych: Judgement and insight appear normal. Mood & affect appropriate.   CBC: Recent Labs  Lab 11/09/17 1043 11/10/17 0634 11/12/17 0416 11/13/17 0413  WBC 6.1 6.2 5.5 5.4  NEUTROABS  --  3.6  --   --   HGB 12.4 12.0 11.6* 11.7*  HCT 40.1 38.7 37.6 38.6  MCV 104.7* 103.8* 103.9* 104.6*  PLT 173 175 169 168   Basic Metabolic Panel: Recent Labs  Lab 11/10/17 0634 11/11/17 0435 11/12/17 0416 11/13/17 0413 11/14/17 0424  NA 142 142 140 142 140  K 3.5 3.4* 3.5 3.8 3.4*  CL 107 106 107 107 102  CO2 25 26 25 26 26   GLUCOSE 95 106* 86 82 87  BUN 13 14 14 15 16   CREATININE 0.97 1.05* 1.06* 1.06* 1.08*  CALCIUM 9.0 8.9 8.6* 8.9 8.9   Liver Function Tests: Recent Labs  Lab 11/14/17 0424  AST 20  ALT 9*  ALKPHOS 73  BILITOT 1.3*  PROT 6.7  ALBUMIN 3.2*   Thyroid Function Tests: Recent Labs    11/13/17 0413  TSH 1.174    LOS: 5 days   Time spent: 25 minutes.  Christine Junkeryan Grunz, MD [facility] Pager [phone]  If 7PM-7AM, please contact night-coverage [email or web address] Password [email or web address] 11/14/2017, 1:37 PM

## 2017-11-15 DIAGNOSIS — J00 Acute nasopharyngitis [common cold]: Secondary | ICD-10-CM

## 2017-11-15 LAB — BASIC METABOLIC PANEL
Anion gap: 12 (ref 5–15)
BUN: 15 mg/dL (ref 6–20)
CHLORIDE: 96 mmol/L — AB (ref 101–111)
CO2: 30 mmol/L (ref 22–32)
CREATININE: 1.01 mg/dL — AB (ref 0.44–1.00)
Calcium: 9 mg/dL (ref 8.9–10.3)
GFR calc Af Amer: >60 mL/min (ref >60)
GFR calc non Af Amer: 58 mL/min — ABNORMAL LOW (ref >60)
Glucose, Bld: 90 mg/dL (ref 65–99)
POTASSIUM: 2.6 mmol/L — AB (ref 3.5–5.1)
SODIUM: 138 mmol/L (ref 135–145)

## 2017-11-15 MED ORDER — MAGNESIUM SULFATE 2 GM/50ML IV SOLN
2.0000 g | Freq: Once | INTRAVENOUS | Status: AC
  Administered 2017-11-15: 2 g via INTRAVENOUS
  Filled 2017-11-15: qty 50

## 2017-11-15 MED ORDER — POTASSIUM CHLORIDE CRYS ER 20 MEQ PO TBCR
40.0000 meq | EXTENDED_RELEASE_TABLET | Freq: Three times a day (TID) | ORAL | Status: DC
  Administered 2017-11-15 (×3): 40 meq via ORAL
  Filled 2017-11-15: qty 4
  Filled 2017-11-15 (×3): qty 2

## 2017-11-15 MED ORDER — FLUTICASONE PROPIONATE 50 MCG/ACT NA SUSP
2.0000 | Freq: Every day | NASAL | Status: DC
  Administered 2017-11-15 – 2017-11-20 (×6): 2 via NASAL
  Filled 2017-11-15: qty 16

## 2017-11-15 MED ORDER — MENTHOL 3 MG MT LOZG
1.0000 | LOZENGE | OROMUCOSAL | Status: DC | PRN
Start: 2017-11-15 — End: 2017-11-20
  Filled 2017-11-15: qty 9

## 2017-11-15 NOTE — Progress Notes (Signed)
CRITICAL VALUE ALERT  Critical Value: 2.6 K+ Date & Time Notied:  11/15/17 0849  Provider Notified: Jarvis NewcomerGrunz  Orders Received/Actions taken: K+ 40mEq PO

## 2017-11-15 NOTE — Progress Notes (Signed)
Progress Note  Patient Name: Christine Hicks Date of Encounter: 11/15/2017  Primary Cardiologist: Lesleigh Noe, MD   Subjective   SOB improving.   Inpatient Medications    Scheduled Meds: . apixaban  5 mg Oral BID  . aspirin EC  81 mg Oral Daily  . ferrous sulfate  325 mg Oral BID WC  . fluticasone  2 spray Each Nare Daily  . furosemide  80 mg Intravenous Q8H  . levothyroxine  150 mcg Oral QAC breakfast  . metolazone  2.5 mg Oral Daily  . pantoprazole  40 mg Oral Daily  . potassium chloride  40 mEq Oral TID  . sodium chloride flush  3 mL Intravenous Q12H   Continuous Infusions: . sodium chloride     PRN Meds: sodium chloride, acetaminophen, menthol-cetylpyridinium, ondansetron (ZOFRAN) IV, sodium chloride flush, traMADol, zolpidem   Vital Signs    Vitals:   11/14/17 1010 11/14/17 1223 11/14/17 1941 11/15/17 0418  BP: 108/60 108/64 (!) 118/50 112/66  Pulse: 73 73 86 86  Resp:  18 18 20   Temp:  97.8 F (36.6 C) 97.8 F (36.6 C) 98.1 F (36.7 C)  TempSrc:  Oral Oral Oral  SpO2:  92% 95% 94%  Weight:    282 lb (127.9 kg)  Height:        Intake/Output Summary (Last 24 hours) at 11/15/2017 1111 Last data filed at 11/15/2017 0930 Gross per 24 hour  Intake 1440 ml  Output 7200 ml  Net -5760 ml   Filed Weights   11/13/17 0635 11/14/17 0533 11/15/17 0418  Weight: (!) 302 lb 4.8 oz (137.1 kg) 293 lb 3.2 oz (133 kg) 282 lb (127.9 kg)    Telemetry    SR - Personally Reviewed  ECG    na  Physical Exam   GEN: No acute distress.   Neck: elevated JVD Cardiac: RRR, no murmurs, rubs, or gallops.  Respiratory: Clear to auscultation bilaterally. GI: Soft, nontender, non-distended  MS: 2+ bilateral LE edema; No deformity. Neuro:  Nonfocal  Psych: Normal affect   Labs    Chemistry Recent Labs  Lab 11/13/17 0413 11/14/17 0424 11/15/17 0702  NA 142 140 138  K 3.8 3.4* 2.6*  CL 107 102 96*  CO2 26 26 30   GLUCOSE 82 87 90  BUN 15 16 15     CREATININE 1.06* 1.08* 1.01*  CALCIUM 8.9 8.9 9.0  PROT  --  6.7  --   ALBUMIN  --  3.2*  --   AST  --  20  --   ALT  --  9*  --   ALKPHOS  --  73  --   BILITOT  --  1.3*  --   GFRNONAA 55* 53* 58*  GFRAA >60 >60 >60  ANIONGAP 9 12 12      Hematology Recent Labs  Lab 11/10/17 0634 11/12/17 0416 11/13/17 0413  WBC 6.2 5.5 5.4  RBC 3.73* 3.62* 3.69*  HGB 12.0 11.6* 11.7*  HCT 38.7 37.6 38.6  MCV 103.8* 103.9* 104.6*  MCH 32.2 32.0 31.7  MCHC 31.0 30.9 30.3  RDW 17.1* 16.9* 16.6*  PLT 175 169 168    Cardiac EnzymesNo results for input(s): TROPONINI in the last 168 hours.  Recent Labs  Lab 11/09/17 1050  TROPIPOC 0.00     BNP Recent Labs  Lab 11/09/17 1213  BNP 316.0*     DDimer No results for input(s): DDIMER in the last 168 hours.   Radiology  No results found.  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 Jarvis NewcomerGrunz, Internal Medicine.    Assessment & Plan    1. Acute on chronic diastolic HF - 10/2017 echo LVEF 60-65%, no WMAs, severe TR, PASP 46.  - negative 5.9 liters yesterday, negative 15 L since admission. She is on 80mg  IV tid and metolazone 2.5mg  daily. Cr trending down with diuresis - continue IV diuresis.   - I will hold her metolazone today due to her significant electrolyte abnormalities today.  - remains fluid overloaded, likely several more days of IV diuresis.   2. History of AVR - normal gradients by last echo  3. History of DVT - she is on eliquis.   4. Hypokalemia - per primary team - holding metolazone today  For questions or updates, please contact [facility] Please consult [email or web address] for contact info under Cardiology/STEMI.      Joanie CoddingtonSigned, Estle Huguley, MD  11/15/2017, 11:11 AM

## 2017-11-15 NOTE — Progress Notes (Addendum)
PROGRESS NOTE  Christine PattenJudith M Kaigler  ZOX:[id number] DOB: 06/11/1956 DOA: 11/09/2017 PCP: Billee CashingMcKenzie, [name], MD  Outpatient Specialists: Cardiology, Katrinka BlazingSmith Brief Narrative: Christine Hicks is a 62 y.o. female with a history of chronic HFpEF, s/p AVR, hypothyroidism and morbid obesity (BMI ~50) who presented 4/15 with anasarca despite compliance with heart healthy diet and lasix at home. She noted ~30 lbs weight gain, dyspnea at rest, and increasing bilateral lower extremity and abdominal swelling. She was admitted, started on IV lasix. Weight has remained 305 > 302, significantly about EDW (office visit weights 260-270's lbs). Dyspnea and leg swelling continue.   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 Suspect po lasix was no longer being absorbed at home. Chest x-ray on admission showed pulmonary edema, BNP was elevated and she has 2+ pitting lower extremity edema. Repeat echo with preserved EF, no mention of diastolic dysfunction; has pulm HTN/increased RV systolic pressure.  - Continue lasix 80mg  IV TID (significant diuresis with this and improving renal function. Hold metolazone with electrolyte derangements. Will need to continue diuresis and monitoring for several days. Appreciate cardiology assistance. - Lisinopril, metoprolol held with aggressive diuresis. BP stable.  - Daily weights, strict I/O, daily BMP - Will need continued telemetry monitoring, ordered.   Hypokalemia: Iatrogenic from aggressive diuresis.  - Replace by mouth today with 120mEq total due to need for ongoing high dose IV diuresis and intact/improving renal function,  empirically give magnesium as well - Recheck BMP, Mg in AM - Agree with holding metolazone today  Rhinitis with postnasal drip/upper airway cough:  - Flonase, cepacol. No indication for CXR at this time - Monitor fever curve.   Morbid obesity: BMI ~50.  - Compliant with dietary modifications at home.  - Nutrition consulted  Hypertension: Normotensive, holding BP meds to make room for aggressive diuresis.  - Holding lisinopril, metoprolol - On lasix as above  Hypothyroidism: TSH 1.174 - Continue stable dose synthroid  History of DVTs: Current swelling also involves abdomen, do not suspect LE recurrence with anticoagulation adherence. - Continue eliquis  Status post aVR: Echo reassuring with normal function.  - Outpatient follow up - SBE prophylaxis   Pulmonary hypertension - Diurese, recommend sleep study for suspected OSA based on habitus and symptoms.   DVT prophylaxis: Eliquis Code Status: Full Family Communication: None at bedside this AM Disposition Plan: Home when improved. Will need protracted IV diuresis.  Consultants:   Cardiology  Procedures:   Echocardiogram 4/16:  - Left ventricle: The cavity size was normal. There was severe   focal basal and mild concentric hypertrophy. Systolic function   was normal. The estimated ejection fraction was in the range of   60% to 65%. Wall motion was normal; there were no regional wall   motion abnormalities. Left ventricular diastolic function   parameters were normal. - Aortic valve: Trileaflet; normal thickness, mildly calcified   leaflets. Valve area (VTI): 0.87 cm^2. Valve area (Vmax): 0.73   cm^2. Valve area (Vmean): 0.8 cm^2. - Left atrium: The atrium was moderately dilated. - Right atrium: The atrium was severely dilated. - Tricuspid valve: There was severe regurgitation. - Pulmonic valve: There was trivial regurgitation. -  Pulmonary arteries: PA peak pressure: 46 mm Hg (S).  Impressions: - The right  ventricular systolic pressure was increased consistent   with moderate pulmonary hypertension.  Antimicrobials:  None  Subjective: Breathing continues to improve day over day, still not at baseline. Swelling also going down a bit. No chest pain, palpitations. Reports some nasal congestion developing over past 24 hrs with hacking cough. No fever or sputum or wheezing, and again, breathing is getting better.   Objective: Vitals:   11/14/17 1223 11/14/17 1941 11/15/17 0418 11/15/17 1149  BP: 108/64 (!) 118/50 112/66 (!) 129/58  Pulse: 73 86 86 84  Resp: 18 18 20 18   Temp: 97.8 F (36.6 C) 97.8 F (36.6 C) 98.1 F (36.7 C)   TempSrc: Oral Oral Oral   SpO2: 92% 95% 94% 92%  Weight:   127.9 kg (282 lb)   Height:        Intake/Output Summary (Last 24 hours) at 11/15/2017 1338 Last data filed at 11/15/2017 0930 Gross per 24 hour  Intake 1080 ml  Output 7200 ml  Net -6120 ml   Filed Weights   11/13/17 0635 11/14/17 0533 11/15/17 0418  Weight: (!) 137.1 kg (302 lb 4.8 oz) 133 kg (293 lb 3.2 oz) 127.9 kg (282 lb)   Gen: Pleasant, obese female in no distress resting in bed Pulm: Non-labored breathing. Distant but clear breath sounds without crackles. CV: Regular rate and rhythm. No murmur, rub, or gallop. Unable to determine JVD, 2+ pitting LE edema, improved but still significant. GI: Abdomen soft, obese, non-tender, non-distended, with normoactive bowel sounds. No organomegaly or masses felt. Ext: Warm, no deformities Skin: Bilateral stasis dermatitis changes to lower legs. Neuro: Alert and oriented. No focal neurological deficits. Psych: Judgement and insight appear normal. Mood & affect appropriate.   CBC: Recent Labs  Lab 11/09/17 1043 11/10/17 0634 11/12/17 0416 11/13/17 0413  WBC 6.1 6.2 5.5 5.4  NEUTROABS  --  3.6  --   --   HGB 12.4 12.0 11.6* 11.7*  HCT 40.1 38.7 37.6 38.6  MCV 104.7* 103.8* 103.9* 104.6*  PLT 173 175 169 168   Basic Metabolic Panel: Recent  Labs  Lab 11/11/17 0435 11/12/17 0416 11/13/17 0413 11/14/17 0424 11/15/17 0702  NA 142 140 142 140 138  K 3.4* 3.5 3.8 3.4* 2.6*  CL 106 107 107 102 96*  CO2 26 25 26 26 30   GLUCOSE 106* 86 82 87 90  BUN 14 14 15 16 15   CREATININE 1.05* 1.06* 1.06* 1.08* 1.01*  CALCIUM 8.9 8.6* 8.9 8.9 9.0   Liver Function Tests: Recent Labs  Lab 11/14/17 0424  AST 20  ALT 9*  ALKPHOS 73  BILITOT 1.3*  PROT 6.7  ALBUMIN 3.2*   Thyroid Function Tests: Recent Labs    11/13/17 0413  TSH 1.174    LOS: 6 days   Time spent: 35 minutes.  Hazeline Junker, MD [facility] Pager [phone]  If 7PM-7AM, please contact night-coverage [email or web address] Password [facility] 11/15/2017, 1:38 PM

## 2017-11-16 DIAGNOSIS — E876 Hypokalemia: Secondary | ICD-10-CM

## 2017-11-16 LAB — MAGNESIUM: Magnesium: 1.9 mg/dL (ref 1.7–2.4)

## 2017-11-16 LAB — BASIC METABOLIC PANEL
ANION GAP: 12 (ref 5–15)
BUN: 13 mg/dL (ref 6–20)
CALCIUM: 8.9 mg/dL (ref 8.9–10.3)
CO2: 31 mmol/L (ref 22–32)
Chloride: 91 mmol/L — ABNORMAL LOW (ref 101–111)
Creatinine, Ser: 0.9 mg/dL (ref 0.44–1.00)
GFR calc Af Amer: >60 mL/min (ref >60)
GFR calc non Af Amer: >60 mL/min (ref >60)
GLUCOSE: 96 mg/dL (ref 65–99)
Potassium: 2.9 mmol/L — ABNORMAL LOW (ref 3.5–5.1)
Sodium: 134 mmol/L — ABNORMAL LOW (ref 135–145)

## 2017-11-16 MED ORDER — POTASSIUM CHLORIDE CRYS ER 20 MEQ PO TBCR
40.0000 meq | EXTENDED_RELEASE_TABLET | Freq: Three times a day (TID) | ORAL | Status: AC
  Administered 2017-11-16 (×3): 40 meq via ORAL
  Filled 2017-11-16 (×3): qty 2

## 2017-11-16 MED ORDER — FUROSEMIDE 10 MG/ML IJ SOLN
80.0000 mg | Freq: Two times a day (BID) | INTRAMUSCULAR | Status: DC
  Administered 2017-11-16 – 2017-11-19 (×6): 80 mg via INTRAVENOUS
  Filled 2017-11-16 (×6): qty 8

## 2017-11-16 NOTE — Progress Notes (Signed)
PROGRESS NOTE  Christine Hicks  ZOX:[id number] DOB: Jun 03, 1956 DOA: 11/09/2017 PCP: Billee Cashing, MD  Outpatient Specialists: Cardiology, Katrinka Blazing Brief Narrative: Christine Hicks is a 62 y.o. female with a history of chronic HFpEF, s/p AVR, hypothyroidism and morbid obesity (BMI ~50) who presented 4/15 with anasarca despite compliance with heart healthy diet and lasix at home. She noted ~30 lbs weight gain, dyspnea at rest, and increasing bilateral lower extremity and abdominal swelling. She was admitted, started on IV lasix. This was titrated upward with assistance by cardiology consultant, requiring lasix 80mg  IV TID and metolazone for diuresis which has been significant. Renal function improved with diuresis. Hypokalemia has been monitored and treated daily. Overall breathing and LE edema have improved.   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 Suspect po lasix was no longer being absorbed at home. Chest x-ray on admission showed pulmonary edema, BNP was elevated and she has 2+ pitting lower extremity edema. Repeat echo with preserved EF, no mention of diastolic dysfunction; has pulm HTN/increased RV systolic pressure.  - Diuresis continues to be brisk. 6L yesterday, now down to 270lbs. Defer to cardiology, though EDW thought to be in 263-270lbs range. Renal function continues to improve! - Lisinopril, metoprolol held with aggressive diuresis. BP stable.  - Daily weights, strict I/O, daily BMP - Will need continued telemetry monitoring, ordered. QTc stable wnl, remains in NSR on my personal review today.   Hypokalemia: Iatrogenic  from aggressive diuresis. Improved modestly with aggressive replacement.  - Repeat TID today. Mg 1.9. Recheck both in AM.    Rhinitis with postnasal drip/upper airway cough:  - Flonase, cepacol. No indication for CXR at this time - Monitor fever curve.   Morbid obesity: BMI ~50.  - Compliant with dietary modifications at home.  - Nutrition consulted  Hypertension: Normotensive, holding BP meds to make room for aggressive diuresis.  - Holding lisinopril, metoprolol - On lasix as above  Hypothyroidism: TSH 1.174 - Continue stable dose synthroid  History of DVTs: Current swelling also involves abdomen, do not suspect LE recurrence with anticoagulation adherence. - Continue eliquis  Status post aVR: Echo reassuring with normal function.  - Outpatient follow up - SBE prophylaxis   Pulmonary hypertension - Diurese, recommend sleep study for suspected OSA based on habitus and symptoms.   DVT prophylaxis: Eliquis Code Status: Full Family Communication: None at bedside this AM Disposition Plan: Home when improved. Possibly in next 48 hrs  Consultants:   Cardiology  Procedures:   Echocardiogram 4/16:  - Left ventricle: The cavity size was normal. There was severe   focal basal and mild concentric hypertrophy. Systolic function   was normal. The estimated ejection fraction was in the range of   60% to 65%. Wall motion was normal; there were no regional wall   motion abnormalities. Left ventricular diastolic function   parameters were normal. - Aortic valve: Trileaflet; normal thickness, mildly calcified   leaflets. Valve area (VTI): 0.87 cm^2. Valve area (Vmax): 0.73   cm^2. Valve area (Vmean): 0.8 cm^2. - Left atrium: The atrium was moderately dilated. - Right atrium: The atrium was severely dilated. - Tricuspid valve: There was severe  regurgitation. - Pulmonic valve: There was trivial regurgitation. - Pulmonary arteries: PA peak pressure: 46 mm Hg  (S).  Impressions: - The right ventricular systolic pressure was increased consistent   with moderate pulmonary hypertension.  Antimicrobials:  None  Subjective: Breathing almost at baseline today. No cough. Swelling in legs improved. No chest pain, palpitations.  Objective: Vitals:   11/15/17 0418 11/15/17 1149 11/15/17 1940 11/16/17 0518  BP: 112/66 (!) 129/58 127/62 120/65  Pulse: 86 84 84 87  Resp: 20 18 18 18   Temp: 98.1 F (36.7 C)  98.8 F (37.1 C) 98.3 F (36.8 C)  TempSrc: Oral  Oral Oral  SpO2: 94% 92% 94% 92%  Weight: 127.9 kg (282 lb)   122.6 kg (270 lb 4.8 oz)  Height:        Intake/Output Summary (Last 24 hours) at 11/16/2017 0854 Last data filed at 11/16/2017 [id number] Gross per 24 hour  Intake 1440 ml  Output 6550 ml  Net -5110 ml   Filed Weights   11/14/17 0533 11/15/17 0418 11/16/17 0518  Weight: 133 kg (293 lb 3.2 oz) 127.9 kg (282 lb) 122.6 kg (270 lb 4.8 oz)   Gen: Pleasant, obese female in no distress resting in bed eating breakfast Pulm: Non-labored breathing. Distant but clear. No crackles or wheezes or upper airway sounds CV: Regular rate and rhythm. No murmur, rub, or gallop. LE pitting edema remains 2+ but overall improved worse on R than L which is usual.. GI: Abdomen soft, obese, non-tender, non-distended, with normoactive bowel sounds. No organomegaly or masses felt. Ext: Warm, no deformities Skin: Bilateral stasis dermatitis changes to lower legs. No wounds, fluctuance.  Neuro: Alert and oriented. No focal neurological deficits. Psych: Judgement and insight appear normal. Mood & affect appropriate.   CBC: Recent Labs  Lab 11/09/17 1043 11/10/17 0634 11/12/17 0416 11/13/17 0413  WBC 6.1 6.2 5.5 5.4  NEUTROABS  --  3.6  --   --   HGB 12.4 12.0 11.6* 11.7*  HCT 40.1 38.7 37.6 38.6  MCV 104.7* 103.8* 103.9* 104.6*  PLT 173 175 169 168   Basic Metabolic Panel: Recent Labs  Lab 11/12/17 0416 11/13/17 0413 11/14/17 0424  11/15/17 0702 11/16/17 0734  NA 140 142 140 138 134*  K 3.5 3.8 3.4* 2.6* 2.9*  CL 107 107 102 96* 91*  CO2 25 26 26 30 31   GLUCOSE 86 82 87 90 96  BUN 14 15 16 15 13   CREATININE 1.06* 1.06* 1.08* 1.01* 0.90  CALCIUM 8.6* 8.9 8.9 9.0 8.9  MG  --   --   --   --  1.9   Liver Function Tests: Recent Labs  Lab 11/14/17 0424  AST 20  ALT 9*  ALKPHOS 73  BILITOT 1.3*  PROT 6.7  ALBUMIN 3.2*    Time spent: 25 minutes.  Hazeline Junkeryan Cohl Behrens, MD [facility] Pager [phone]  If 7PM-7AM, please contact night-coverage [email or web address] Password [email or web address] 11/16/2017, 8:54 AM

## 2017-11-16 NOTE — Progress Notes (Addendum)
Progress Note  Patient Name: Christine Hicks Date of Encounter: 11/16/2017  Primary Cardiologist: Lesleigh NoeHenry W Smith III, MD   Subjective   Pt feeling well today. Remains fluid volume overloaded on exam. Reports breathing is improved  Inpatient Medications    Scheduled Meds: . apixaban  5 mg Oral BID  . aspirin EC  81 mg Oral Daily  . ferrous sulfate  325 mg Oral BID WC  . fluticasone  2 spray Each Nare Daily  . furosemide  80 mg Intravenous Q8H  . levothyroxine  150 mcg Oral QAC breakfast  . pantoprazole  40 mg Oral Daily  . potassium chloride  40 mEq Oral TID  . sodium chloride flush  3 mL Intravenous Q12H   Continuous Infusions: . sodium chloride     PRN Meds: sodium chloride, acetaminophen, menthol-cetylpyridinium, ondansetron (ZOFRAN) IV, sodium chloride flush, traMADol, zolpidem   Vital Signs    Vitals:   11/15/17 0418 11/15/17 1149 11/15/17 1940 11/16/17 0518  BP: 112/66 (!) 129/58 127/62 120/65  Pulse: 86 84 84 87  Resp: 20 18 18 18   Temp: 98.1 F (36.7 C)  98.8 F (37.1 C) 98.3 F (36.8 C)  TempSrc: Oral  Oral Oral  SpO2: 94% 92% 94% 92%  Weight: 282 lb (127.9 kg)   270 lb 4.8 oz (122.6 kg)  Height:        Intake/Output Summary (Last 24 hours) at 11/16/2017 1232 Last data filed at 11/16/2017 1106 Gross per 24 hour  Intake 1440 ml  Output 8250 ml  Net -6810 ml   Filed Weights   11/14/17 0533 11/15/17 0418 11/16/17 0518  Weight: 293 lb 3.2 oz (133 kg) 282 lb (127.9 kg) 270 lb 4.8 oz (122.6 kg)    Physical Exam   General: Well developed, well nourished, NAD Skin: Warm, dry, intact  Head: Normocephalic, atraumatic, clear, moist mucus membranes. Neck: Negative for carotid bruits. No JVD Lungs: Crackles in lower bases. No wheezes, rales, or rhonchi. Breathing is unlabored. Cardiovascular: RRR with S1 S2. No murmurs, rubs, gallops, or LV heave appreciated. Abdomen: Soft, non-tender, non-distended with normoactive bowel sounds. No hepatomegaly, No  rebound/guarding. No obvious abdominal masses. MSK: Strength and tone appear normal for age. 5/5 in all extremities Extremities: 4+ LE edema. No clubbing or cyanosis. DP/PT pulses 1+ bilaterally Neuro: Alert and oriented. No focal deficits. No facial asymmetry. MAE spontaneously. Psych: Responds to questions appropriately with normal affect.    Labs    Chemistry Recent Labs  Lab 11/14/17 0424 11/15/17 0702 11/16/17 0734  NA 140 138 134*  K 3.4* 2.6* 2.9*  CL 102 96* 91*  CO2 26 30 31   GLUCOSE 87 90 96  BUN 16 15 13   CREATININE 1.08* 1.01* 0.90  CALCIUM 8.9 9.0 8.9  PROT 6.7  --   --   ALBUMIN 3.2*  --   --   AST 20  --   --   ALT 9*  --   --   ALKPHOS 73  --   --   BILITOT 1.3*  --   --   GFRNONAA 53* 58* >60  GFRAA >60 >60 >60  ANIONGAP 12 12 12      Hematology Recent Labs  Lab 11/10/17 0634 11/12/17 0416 11/13/17 0413  WBC 6.2 5.5 5.4  RBC 3.73* 3.62* 3.69*  HGB 12.0 11.6* 11.7*  HCT 38.7 37.6 38.6  MCV 103.8* 103.9* 104.6*  MCH 32.2 32.0 31.7  MCHC 31.0 30.9 30.3  RDW 17.1*  16.9* 16.6*  PLT 175 169 168    Cardiac EnzymesNo results for input(s): TROPONINI in the last 168 hours. No results for input(s): TROPIPOC in the last 168 hours.   BNPNo results for input(s): BNP, PROBNP in the last 168 hours.   DDimer No results for input(s): DDIMER in the last 168 hours.   Radiology    No results found.  Telemetry    11/16/17 NSR  - Personally Reviewed  ECG    No new tracing as of 11/16/17 - Personally Reviewed  Cardiac Studies   Right Heart Cath and Coronary Angiography 12/16/16  Conclusion    Critical aortic stenosis, on a bicuspid aortic valve with documented greater than 120 mm peak to peak and 85 mm mean gradient by recent echo.  Normal coronary arteries.  Normal pulmonary pressures. The capillary wedge pressure is 14 mmHg mean.   Acute on chronic diastolic heart failure, resolved after inpatient diuresis over the past 4 days.     2D  Echo 11/10/17 Study Conclusions  - Left ventricle: The cavity size was normal. There was severe focal basal and mild concentric hypertrophy. Systolic function was normal. The estimated ejection fraction was in the range of 60% to 65%. Wall motion was normal; there were no regional wall motion abnormalities. Left ventricular diastolic function parameters were normal. - Aortic valve: Trileaflet; normal thickness, mildly calcified leaflets. Valve area (VTI): 0.87 cm^2. Valve area (Vmax): 0.73 cm^2. Valve area (Vmean): 0.8 cm^2. - Left atrium: The atrium was moderately dilated. - Right atrium: The atrium was severely dilated. - Tricuspid valve: There was severe regurgitation. - Pulmonic valve: There was trivial regurgitation. - Pulmonary arteries: PA peak pressure: 46 mm Hg (S).  Impressions:  - The right ventricular systolic pressure was increased consistent with moderate pulmonary hypertension.  Patient Profile     62 y.o. 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 Jarvis Newcomer, Internal Medicine.  Assessment & Plan    1. Acute on chronic diastolic HF: - Echo 10/2017 with LVEF 60-65%, no WMAs, severe TR, PASP 46.  -Weight, 270lb today, 282lb yesterday and 305lb on admission  -I&O, net negative 22L since admission with total 24H output 6L yesterday  -On 80mg  IV tid. 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 Cornelius Moras -Normal gradients by last echo 11/10/17  3. History of recurrent DVT -On chronic Eliquis 5mg  PO BID   4. Hypokalemia: -K+ 2.9 today>>supplemental K given per primary team -Metolazone on hold   5. HTN: -Stable, 120/65>127/62>129/58  6. Hypothyroidism: -On synthroid -TSH WNL  Signed, Georgie Chard NP-C [facility] Pager: [phone] 11/16/2017, 12:32 PM     Patient seen and examined. Agree with assessment and plan. Pt is breathing better.  35 lb weight loss since admission and I/O -22,202. Still with significant edema. No significant rales.  Will change lasix from 80 mg q 8 hrs to q 12 hrs.  K replete to 4;  Mg 1.9. S/P AVR 12/2016; echo with discordant data regarding AVA, with nl LV fxn and mild gradient c/w valve.   Lennette Bihari, MD, [facility] 11/16/2017 1:22 PM  For questions or updates, please contact   Please consult [email or web address] for contact info under Cardiology/STEMI.

## 2017-11-16 NOTE — Plan of Care (Signed)
Nutrition Education Note  RD consulted for nutrition education regarding CHF.  Case discussed with RN prior to visit, who reports pt with good appetite and has had large diuresis since admission. Pt is still fluid overloaded.   Spoke with pt, who reports frustration with her diet. She shares "I feel like I can't eat anything". She estimates her dry weight is around 250#. She reports she weighs herself daily and both her and her son (who assists with care and meal preparation) are diligent regarding food and fluid intake. Typical intake is 3 meals per day (Breakfast: oatmeal or dry cereal with a boiled or fried egg, Lunch: sandwich, Dinner: fruit, or chicken with vegetables. Fluid intake: 2 8 oz bottles of water, 1/2 cup of hot tea, 1 cup of orange juice). Pt shares that she will occasionally consume sherbert and fried chicken (approximately once a week). She is most concerned about what meats she can consume; she always seasons her food with Mrs Sharilyn SitesDash.   Spent most of the visit reviewing fluid restriction and sources of fluids. Encouraged continued close monitoring of weights, diet, and fluids.   RD provided "Low Sodium Nutrition Therapy" and "Sodium Free Seasonings"  handouts from the Academy of [employer]. Reviewed patient's dietary recall. Provided examples on ways to decrease sodium intake in diet. Discouraged intake of processed foods and use of salt shaker. Encouraged fresh fruits and vegetables as well as whole grain sources of carbohydrates to maximize fiber intake.   RD discussed why it is important for patient to adhere to diet recommendations, and emphasized the role of fluids, foods to avoid, and importance of weighing self daily. Teach back method used.  Expect fair to good compliance.  Body mass index is 44.98 kg/m. Pt meets criteria for extreme obesity, class III based on current BMI.  Current diet order is Heart Healthy with 1500 ml fluid restriction, patient is  consuming approximately 100% of meals at this time. Labs and medications reviewed. No further nutrition interventions warranted at this time. RD contact information provided. If additional nutrition issues arise, please re-consult RD.   Christine Hicks, RD, LDN, CDE Pager: [phone] After hours Pager: [phone]

## 2017-11-16 NOTE — Care Management Important Message (Signed)
Important Message  Patient Details  Name: Christine Hicks MRN: [id number] Date of Birth: 01/29/1956   Medicare Important Message Given:  Yes    Oralia RudMegan P Kyley Solow 11/16/2017, 3:49 PM

## 2017-11-16 NOTE — Progress Notes (Signed)
Physical Therapy Treatment Patient Details Name: Christine Hicks MRN: [id number] DOB: 11/10/1955 Today's Date: 11/16/2017    History of Present Illness Patient is a 62 y.o. F with significant PMH of hypothyroidism, chronic diastolic dysfunction, AVR in mid 2018, and morbid obesity presenting with chest pain. Has been retaining fluid with 30-40 pound weight gain in recent 3-4 months.      PT Comments    Pt was able to assist with LE exercises, mainly progressing with ROM to L hip and reducing stiffness and pain of same.  Lower legs are sensitive and so will plan to monitor edema and inflammation there.  Follow acutely and work toward better standing control and safety with movement through better joint quality of range.   Follow Up Recommendations  Home health PT;Supervision for mobility/OOB     Equipment Recommendations  Rolling walker with 5" wheels    Recommendations for Other Services       Precautions / Restrictions Precautions Precautions: Fall Restrictions Weight Bearing Restrictions: No    Mobility  Bed Mobility Overal bed mobility: Needs Assistance                Transfers                    Ambulation/Gait                 Stairs             Wheelchair Mobility    Modified Rankin (Stroke Patients Only)       Balance                                            Cognition Arousal/Alertness: Awake/alert Behavior During Therapy: WFL for tasks assessed/performed Overall Cognitive Status: Within Functional Limits for tasks assessed                                        Exercises General Exercises - Lower Extremity Ankle Circles/Pumps: AROM;Both;10 reps Quad Sets: AROM;Both;10 reps Gluteal Sets: AROM;Both;10 reps Heel Slides: Strengthening;Both;20 reps Hip ABduction/ADduction: Strengthening;Both;20 reps    General Comments        Pertinent Vitals/Pain Pain Assessment: Faces Faces Pain  Scale: Hurts little more Pain Location: B LEs  Pain Descriptors / Indicators: Sore Pain Intervention(s): Limited activity within patient's tolerance;Repositioned;Monitored during session;Premedicated before session    Home Living                      Prior Function            PT Goals (current goals can now be found in the care plan section) Acute Rehab PT Goals Patient Stated Goal: get home and get outside to walk Progress towards PT goals: Progressing toward goals    Frequency    Min 3X/week      PT Plan Current plan remains appropriate    Co-evaluation              AM-PAC PT "6 Clicks" Daily Activity  Outcome Measure  Difficulty turning over in bed (including adjusting bedclothes, sheets and blankets)?: A Little Difficulty moving from lying on back to sitting on the side of the bed? : Unable Difficulty sitting down on and standing up from a chair with  arms (e.g., wheelchair, bedside commode, etc,.)?: Unable Help needed moving to and from a bed to chair (including a wheelchair)?: A Little Help needed walking in hospital room?: A Little Help needed climbing 3-5 steps with a railing? : A Lot 6 Click Score: 13    End of Session         PT Visit Diagnosis: Unsteadiness on feet (R26.81);Muscle weakness (generalized) (M62.81);Difficulty in walking, not elsewhere classified (R26.2)     Time: [id number] PT Time Calculation (min) (ACUTE ONLY): 25 min  Charges:  $Therapeutic Exercise: 23-37 mins                    G Codes:  Functional Assessment Tool Used: AM-PAC 6 Clicks Basic Mobility    Christine Hicks 11/16/2017, 4:21 PM   Christine Hicks, PT MS Acute Rehab Dept. Number: [facility] [phone] and [facility] [phone]

## 2017-11-17 ENCOUNTER — Telehealth: Payer: Self-pay

## 2017-11-17 LAB — BASIC METABOLIC PANEL
Anion gap: 12 (ref 5–15)
BUN: 17 mg/dL (ref 6–20)
CO2: 30 mmol/L (ref 22–32)
CREATININE: 0.94 mg/dL (ref 0.44–1.00)
Calcium: 9.2 mg/dL (ref 8.9–10.3)
Chloride: 94 mmol/L — ABNORMAL LOW (ref 101–111)
GFR calc Af Amer: >60 mL/min (ref >60)
GLUCOSE: 92 mg/dL (ref 65–99)
POTASSIUM: 3.5 mmol/L (ref 3.5–5.1)
SODIUM: 136 mmol/L (ref 135–145)

## 2017-11-17 MED ORDER — POTASSIUM CHLORIDE CRYS ER 20 MEQ PO TBCR
20.0000 meq | EXTENDED_RELEASE_TABLET | Freq: Once | ORAL | Status: AC
  Administered 2017-11-17: 20 meq via ORAL
  Filled 2017-11-17: qty 1

## 2017-11-17 MED ORDER — POTASSIUM CHLORIDE CRYS ER 20 MEQ PO TBCR
40.0000 meq | EXTENDED_RELEASE_TABLET | Freq: Once | ORAL | Status: AC
  Administered 2017-11-17: 40 meq via ORAL
  Filled 2017-11-17: qty 2

## 2017-11-17 NOTE — Telephone Encounter (Signed)
Met with the patient to discuss her plan for medical follow up after discharge. She stated that she has been scheduled to see a new PCP. As per Carles Collet, RN CM the patient has been scheduled with [facility] and will be seen by Dr Rudene Anda.  This information was provided to the patient.  Update provided to Olga Coaster, RN CM

## 2017-11-17 NOTE — Progress Notes (Addendum)
Progress Note  Patient Name: Christine Hicks Date of Encounter: 11/17/2017  Primary Cardiologist: Lesleigh Noe, MD  Subjective   Pt feeling well today. Remains fluid volume overloaded on exam. Reports breathing is improved. Pt has walked with PT without complaint.  Inpatient Medications    Scheduled Meds: . apixaban  5 mg Oral BID  . aspirin EC  81 mg Oral Daily  . ferrous sulfate  325 mg Oral BID WC  . fluticasone  2 spray Each Nare Daily  . furosemide  80 mg Intravenous BID  . levothyroxine  150 mcg Oral QAC breakfast  . pantoprazole  40 mg Oral Daily  . potassium chloride  20 mEq Oral Once  . sodium chloride flush  3 mL Intravenous Q12H   Continuous Infusions: . sodium chloride     PRN Meds: sodium chloride, acetaminophen, menthol-cetylpyridinium, ondansetron (ZOFRAN) IV, sodium chloride flush, traMADol, zolpidem   Vital Signs    Vitals:   11/16/17 1700 11/16/17 2009 11/17/17 0512 11/17/17 0836  BP: 136/72 136/61 122/65 124/68  Pulse:  87 86 100  Resp:  18 18   Temp:  97.7 F (36.5 C) 98.3 F (36.8 C)   TempSrc:  Oral Oral   SpO2: 95% 94% 92% 95%  Weight:   264 lb 9.6 oz (120 kg)   Height:        Intake/Output Summary (Last 24 hours) at 11/17/2017 1052 Last data filed at 11/17/2017 0921 Gross per 24 hour  Intake 600 ml  Output 3650 ml  Net -3050 ml   Filed Weights   11/15/17 0418 11/16/17 0518 11/17/17 0512  Weight: 282 lb (127.9 kg) 270 lb 4.8 oz (122.6 kg) 264 lb 9.6 oz (120 kg)    Physical Exam   General: Well developed, well nourished, NAD Skin: Warm, dry, intact  Head: Normocephalic, atraumatic, clear, moist mucus membranes. Neck: Negative for carotid bruits. No JVD Lungs: Crackles in lower bases. No wheezes, rales, or rhonchi. Breathing is unlabored. Cardiovascular: RRR with S1 S2. No murmurs, rubs gallops, or LV heave appreciated. Abdomen: Soft, non-tender, non-distended with normoactive bowel sounds. No obvious abdominal  masses. MSK: Strength and tone appear normal for age. 5/5 in all extremities Extremities: 3+ LE edema. No clubbing or cyanosis. DP/PT pulses 1+ bilaterally Neuro: Alert and oriented. No focal deficits. No facial asymmetry. MAE spontaneously. Psych: Responds to questions appropriately with normal affect.    Labs    Chemistry Recent Labs  Lab 11/14/17 0424 11/15/17 0702 11/16/17 0734 11/17/17 0531  NA 140 138 134* 136  K 3.4* 2.6* 2.9* 3.5  CL 102 96* 91* 94*  CO2 26 30 31 30   GLUCOSE 87 90 96 92  BUN 16 15 13 17   CREATININE 1.08* 1.01* 0.90 0.94  CALCIUM 8.9 9.0 8.9 9.2  PROT 6.7  --   --   --   ALBUMIN 3.2*  --   --   --   AST 20  --   --   --   ALT 9*  --   --   --   ALKPHOS 73  --   --   --   BILITOT 1.3*  --   --   --   GFRNONAA 53* 58* >60 >60  GFRAA >60 >60 >60 >60  ANIONGAP 12 12 12 12      Hematology Recent Labs  Lab 11/12/17 0416 11/13/17 0413  WBC 5.5 5.4  RBC 3.62* 3.69*  HGB 11.6* 11.7*  HCT  37.6 38.6  MCV 103.9* 104.6*  MCH 32.0 31.7  MCHC 30.9 30.3  RDW 16.9* 16.6*  PLT 169 168    Cardiac EnzymesNo results for input(s): TROPONINI in the last 168 hours. No results for input(s): TROPIPOC in the last 168 hours.   BNPNo results for input(s): BNP, PROBNP in the last 168 hours.   DDimer No results for input(s): DDIMER in the last 168 hours.   Radiology    No results found.  Telemetry    NSR - Personally Reviewed  ECG    No new tracing as of 11/17/17 - Personally Reviewed  Cardiac Studies   Right Heart Cath and Coronary Angiography5/22/18  Conclusion    Critical aortic stenosis, on a bicuspid aortic valve with documented greater than 120 mm peak to peak and 85 mm mean gradient by recent echo.  Normal coronary arteries.  Normal pulmonary pressures. The capillary wedge pressure is 14 mmHg mean.   Acute on chronic diastolic heart failure, resolved after inpatient diuresis over the past 4 days.     2D Echo 11/10/17 Study  Conclusions  - Left ventricle: The cavity size was normal. There was severe focal basal and mild concentric hypertrophy. Systolic function was normal. The estimated ejection fraction was in the range of 60% to 65%. Wall motion was normal; there were no regional wall motion abnormalities. Left ventricular diastolic function parameters were normal. - Aortic valve: Trileaflet; normal thickness, mildly calcified leaflets. Valve area (VTI): 0.87 cm^2. Valve area (Vmax): 0.73 cm^2. Valve area (Vmean): 0.8 cm^2. - Left atrium: The atrium was moderately dilated. - Right atrium: The atrium was severely dilated. - Tricuspid valve: There was severe regurgitation. - Pulmonic valve: There was trivial regurgitation. - Pulmonary arteries: PA peak pressure: 46 mm Hg (S).  Impressions:  - The right ventricular systolic pressure was increased consistent with moderate pulmonary hypertension.  Patient Profile     62 y.o. 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 Jarvis NewcomerGrunz, Internal Medicine.  Assessment & Plan    1. Acute on chronic diastolic HF: - Echo 10/2017 with LVEF 60-65%, no WMAs, severe TR, PASP 46  -Weight, 264lb today, 270lb yesterday and 305lb on admission  -I&O, net negative 24.4L since admission with total 24H output 6L yesterday  -Was on 80mg  IV tid. Metolazone dc'd -Continue lasix to 80mg  BID -Remains with significant LE edema and crackles in lower lung bases -Cr trending down with diuresis, 0.94 today>>>0.90 11/16/17 -Continue IV diuresis for now -Remains fluid overloaded, likely several more days of IV diuresis  2.H/o Bioprosthetic Aortic Valve Replacement w/ Aortic Root  Replacement : -12/2016 per Dr. Cornelius Moraswen -Normal gradients by last echo 11/10/17  3. History of recurrent DVT -On chronic Eliquis 5mg  PO BID   4. Hypokalemia: -K+ 3.5 today -Metolazone on hold   5. HTN: -Stable, 124/68>122/65>136/61  6. Hypothyroidism: -On synthroid -TSH WNL   Signed, Georgie ChardJill McDaniel NP-C [facility] Pager: [phone] 11/17/2017, 10:52 AM      Patient seen and examined. Agree with assessment and plan. Breathing better today. Edema slowly improving.  I/O now -24,452 since admission. Weight 305 >> 264, with an additional 6 lb weight loss since yesterday.  Lasix was reduced to bid yesterday. K 3.5 today. Will change to oral lasix tomorrow. K  replete to 4.    Lennette Bihari, MD, [facility] 11/17/2017 2:40 PM   For questions or updates, please contact   Please consult [email or web address] for contact info under Cardiology/STEMI.

## 2017-11-17 NOTE — Progress Notes (Signed)
PROGRESS NOTE  Christine Hicks  ZOX:[id number] DOB: September 26, 1955 DOA: 11/09/2017 PCP: Billee Cashing, MD  Outpatient Specialists: Cardiology, Katrinka Blazing Brief Narrative: Christine Hicks is a 62 y.o. female with a history of chronic HFpEF, s/p AVR, hypothyroidism and morbid obesity (BMI ~50) who presented 4/15 with anasarca despite compliance with heart healthy diet and lasix at home. She noted ~30 lbs weight gain, dyspnea at rest, and increasing bilateral lower extremity and abdominal swelling. She was admitted, started on IV lasix. This was titrated upward with assistance by cardiology consultant, requiring lasix 80mg  IV TID and metolazone for diuresis which has been significant. Renal function improved with diuresis. Hypokalemia has been monitored and treated daily. Overall breathing and LE edema have improved.   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 Suspect po lasix was no longer being absorbed at home. Chest x-ray on admission showed pulmonary edema, BNP was elevated and she has 2+ pitting lower extremity edema. Repeat echo with preserved EF, no mention of diastolic dysfunction; has pulm HTN/increased RV systolic pressure.  - Continue IV diuresis, possibly deescalating 4/24. Defer to cardiology, though EDW thought previously to be ~265, though pt reports weighing 250lbs at her baseline. Renal function continues to improve! - Lisinopril, metoprolol held with aggressive diuresis. BP stable.  - Daily weights, strict I/O, daily BMP - Telemetry stable.   Hypokalemia: Iatrogenic from aggressive diuresis. Improved modestly with  aggressive replacement.  - Improving, will replace w/goal ~4. Due to ongoing diuresis will give then today and recheck in AM.     Rhinitis with postnasal drip/upper airway cough: Afebrile. - Flonase, cepacol. No indication for CXR at this time - Monitor fever curve.   Morbid obesity: BMI ~50.  - Compliant with dietary modifications at home.  - Nutrition consulted, appreciate their education.  Hypertension: Normotensive, holding BP meds to make room for aggressive diuresis.  - Holding lisinopril, metoprolol - On lasix as above  Hypothyroidism: TSH 1.174 - Continue stable dose synthroid  History of DVTs: Current swelling also involves abdomen, do not suspect LE recurrence with anticoagulation adherence. - Continue eliquis  Status post aVR: Echo reassuring with normal function.  - Outpatient follow up - SBE prophylaxis   Pulmonary hypertension - Diurese, recommend sleep study for suspected OSA based on habitus and symptoms.   DVT prophylaxis: Eliquis Code Status: Full Family Communication: Son at bedside this AM Disposition Plan: Home when improved. Possibly in next 48 hrs  Consultants:   Cardiology  Procedures:   Echocardiogram 4/16:  - Left ventricle: The cavity size was normal. There was severe   focal basal and mild concentric hypertrophy. Systolic function   was normal. The estimated ejection fraction was in the range of   60% to 65%. Wall motion was normal; there were no regional wall   motion abnormalities. Left ventricular diastolic function   parameters were normal. - Aortic valve: Trileaflet; normal thickness, mildly calcified   leaflets. Valve area (VTI): 0.87 cm^2. Valve area (Vmax): 0.73   cm^2. Valve area (Vmean): 0.8 cm^2. - Left atrium: The atrium was moderately dilated. - Right atrium: The atrium was severely dilated. - Tricuspid valve:  There was severe regurgitation. - Pulmonic valve: There was trivial regurgitation. - Pulmonary  arteries: PA peak pressure: 46 mm Hg (S).  Impressions: - The right ventricular systolic pressure was increased consistent   with moderate pulmonary hypertension.  Antimicrobials:  None  Subjective: Continues to feel better day after day. Breathing improved, getting around easier. Speaking with son at bedside. Denies chest pain or palpitations or constipation.  Objective: Vitals:   11/16/17 1700 11/16/17 2009 11/17/17 0512 11/17/17 0836  BP: 136/72 136/61 122/65 124/68  Pulse:  87 86 100  Resp:  18 18   Temp:  97.7 F (36.5 C) 98.3 F (36.8 C)   TempSrc:  Oral Oral   SpO2: 95% 94% 92% 95%  Weight:   120 kg (264 lb 9.6 oz)   Height:        Intake/Output Summary (Last 24 hours) at 11/17/2017 1512 Last data filed at 11/17/2017 [id number] Gross per 24 hour  Intake 600 ml  Output 2850 ml  Net -2250 ml   Filed Weights   11/15/17 0418 11/16/17 0518 11/17/17 0512  Weight: 127.9 kg (282 lb) 122.6 kg (270 lb 4.8 oz) 120 kg (264 lb 9.6 oz)   Gen: Pleasant, obese female in no distress resting in bed eating breakfast Pulm: Non-labored breathing. Distant, scant crackles at bases.  CV: Regular rate and rhythm. No murmur, rub, or gallop. LE pitting edema remains 2+ but overall improved worse on R than L which is usual.. GI: Abdomen soft, obese, non-tender, non-distended, with normoactive bowel sounds. No organomegaly or masses felt. Ext: Warm, no deformities Skin: Bilateral stasis dermatitis changes to lower legs. No wounds, fluctuance.  Neuro: Alert and oriented. No focal neurological deficits. Psych: Judgement and insight appear normal. Mood & affect appropriate.   CBC: Recent Labs  Lab 11/12/17 0416 11/13/17 0413  WBC 5.5 5.4  HGB 11.6* 11.7*  HCT 37.6 38.6  MCV 103.9* 104.6*  PLT 169 168   Basic Metabolic Panel: Recent Labs  Lab 11/13/17 0413 11/14/17 0424 11/15/17 0702 11/16/17 0734 11/17/17 0531  NA 142 140 138 134* 136  K 3.8 3.4* 2.6* 2.9* 3.5  CL 107 102 96* 91*  94*  CO2 26 26 30 31 30   GLUCOSE 82 87 90 96 92  BUN 15 16 15 13 17   CREATININE 1.06* 1.08* 1.01* 0.90 0.94  CALCIUM 8.9 8.9 9.0 8.9 9.2  MG  --   --   --  1.9  --    Liver Function Tests: Recent Labs  Lab 11/14/17 0424  AST 20  ALT 9*  ALKPHOS 73  BILITOT 1.3*  PROT 6.7  ALBUMIN 3.2*    Time spent: 25 minutes.  Hazeline Junkeryan Ebon Ketchum, MD [facility] Pager [phone]  If 7PM-7AM, please contact night-coverage [email or web address] Password [email or web address] 11/17/2017, 3:12 PM

## 2017-11-18 LAB — BASIC METABOLIC PANEL
ANION GAP: 10 (ref 5–15)
BUN: 18 mg/dL (ref 6–20)
CALCIUM: 9 mg/dL (ref 8.9–10.3)
CO2: 29 mmol/L (ref 22–32)
CREATININE: 0.99 mg/dL (ref 0.44–1.00)
Chloride: 96 mmol/L — ABNORMAL LOW (ref 101–111)
GFR, EST NON AFRICAN AMERICAN: 59 mL/min — AB (ref >60)
Glucose, Bld: 99 mg/dL (ref 65–99)
Potassium: 3.4 mmol/L — ABNORMAL LOW (ref 3.5–5.1)
SODIUM: 135 mmol/L (ref 135–145)

## 2017-11-18 LAB — BRAIN NATRIURETIC PEPTIDE: B NATRIURETIC PEPTIDE 5: 191.3 pg/mL — AB (ref 0.0–100.0)

## 2017-11-18 MED ORDER — POTASSIUM CHLORIDE CRYS ER 20 MEQ PO TBCR
40.0000 meq | EXTENDED_RELEASE_TABLET | Freq: Once | ORAL | Status: AC
  Administered 2017-11-18: 40 meq via ORAL
  Filled 2017-11-18: qty 2

## 2017-11-18 NOTE — Progress Notes (Addendum)
Progress Note  Patient Name: Christine Hicks Date of Encounter: 11/18/2017  Primary Cardiologist: Lesleigh Noe, MD   Subjective   Feeling better. Breathing improved. No chest pain.   Inpatient Medications    Scheduled Meds: . apixaban  5 mg Oral BID  . aspirin EC  81 mg Oral Daily  . ferrous sulfate  325 mg Oral BID WC  . fluticasone  2 spray Each Nare Daily  . furosemide  80 mg Intravenous BID  . levothyroxine  150 mcg Oral QAC breakfast  . pantoprazole  40 mg Oral Daily  . sodium chloride flush  3 mL Intravenous Q12H   Continuous Infusions: . sodium chloride     PRN Meds: sodium chloride, acetaminophen, menthol-cetylpyridinium, ondansetron (ZOFRAN) IV, sodium chloride flush, traMADol, zolpidem   Vital Signs    Vitals:   11/17/17 0836 11/17/17 2012 11/18/17 0620 11/18/17 0830  BP: 124/68 (!) 111/54  135/62  Pulse: 100 92    Resp:      Temp:      TempSrc:      SpO2: 95% 92%    Weight:   259 lb 14.4 oz (117.9 kg)   Height:        Intake/Output Summary (Last 24 hours) at 11/18/2017 1244 Last data filed at 11/18/2017 1000 Gross per 24 hour  Intake 240 ml  Output 750 ml  Net -510 ml   Filed Weights   11/16/17 0518 11/17/17 0512 11/18/17 0620  Weight: 270 lb 4.8 oz (122.6 kg) 264 lb 9.6 oz (120 kg) 259 lb 14.4 oz (117.9 kg)    Telemetry    Sinus tach, low 100s - Personally Reviewed  ECG    Sinus tach 104 - Personally Reviewed  Physical Exam   GEN: No acute distress. Morbidly obese   Neck: No JVD Cardiac: regular rhythm, tachy rate no murmurs, rubs, or gallops.  Respiratory: Clear to auscultation bilaterally. GI: Soft, nontender, non-distended  MS: bilateral LEE present, but significantly improved Neuro:  Nonfocal  Psych: Normal affect   Labs    Chemistry Recent Labs  Lab 11/14/17 0424  11/16/17 0734 11/17/17 0531 11/18/17 0454  NA 140   < > 134* 136 135  K 3.4*   < > 2.9* 3.5 3.4*  CL 102   < > 91* 94* 96*  CO2 26   < > 31 30 29     GLUCOSE 87   < > 96 92 99  BUN 16   < > 13 17 18   CREATININE 1.08*   < > 0.90 0.94 0.99  CALCIUM 8.9   < > 8.9 9.2 9.0  PROT 6.7  --   --   --   --   ALBUMIN 3.2*  --   --   --   --   AST 20  --   --   --   --   ALT 9*  --   --   --   --   ALKPHOS 73  --   --   --   --   BILITOT 1.3*  --   --   --   --   GFRNONAA 53*   < > >60 >60 59*  GFRAA >60   < > >60 >60 >60  ANIONGAP 12   < > 12 12 10    < > = values in this interval not displayed.     Hematology Recent Labs  Lab 11/12/17 0416 11/13/17 0413  WBC 5.5 5.4  RBC 3.62* 3.69*  HGB 11.6* 11.7*  HCT 37.6 38.6  MCV 103.9* 104.6*  MCH 32.0 31.7  MCHC 30.9 30.3  RDW 16.9* 16.6*  PLT 169 168    Cardiac EnzymesNo results for input(s): TROPONINI in the last 168 hours. No results for input(s): TROPIPOC in the last 168 hours.   BNP Recent Labs  Lab 11/18/17 0454  BNP 191.3*     DDimer No results for input(s): DDIMER in the last 168 hours.   Radiology    No results found.  Cardiac Studies   Right Heart Cath and Coronary Angiography5/22/18  Conclusion    Critical aortic stenosis, on a bicuspid aortic valve with documented greater than 120 mm peak to peak and 85 mm mean gradient by recent echo.  Normal coronary arteries.  Normal pulmonary pressures. The capillary wedge pressure is 14 mmHg mean.   Acute on chronic diastolic heart failure, resolved after inpatient diuresis over the past 4 days.     2D Echo 11/10/17 Study Conclusions  - Left ventricle: The cavity size was normal. There was severe focal basal and mild concentric hypertrophy. Systolic function was normal. The estimated ejection fraction was in the range of 60% to 65%. Wall motion was normal; there were no regional wall motion abnormalities. Left ventricular diastolic function parameters were normal. - Aortic valve: Trileaflet; normal thickness, mildly calcified leaflets. Valve area (VTI): 0.87 cm^2. Valve area (Vmax):  0.73 cm^2. Valve area (Vmean): 0.8 cm^2. - Left atrium: The atrium was moderately dilated. - Right atrium: The atrium was severely dilated. - Tricuspid valve: There was severe regurgitation. - Pulmonic valve: There was trivial regurgitation. - Pulmonary arteries: PA peak pressure: 46 mm Hg (S).  Impressions:  - The right ventricular systolic pressure was increased consistent with moderate pulmonary hypertension.  Patient Profile     62 y.o. 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 Jarvis NewcomerGrunz, Internal Medicine.   Assessment & Plan    1. Acute on Chronic Diastolic CHF: pt came in with volume overload/ anasarca. Massive diuresis. Net I/Os -24.9 L total. Weight down from 305 lb to 259 lb. Breathing improved. LEE improved but edema is still present bilaterally. Lasix titrated down from 80 mg IV TID>>BID. Plan to continue IV Lasix today. May be able to transition to PO tomorrow. Renal function is stable. K low at 3.4. Will order a dose of Kdur. Recommend low salt diet.   2. H/o Bioprosthetic Aortic Valve Replacement + Aortic Root Replacement: h/o congenital bicuspid valve w/ severe stenosis. Surgery done 12/2016 per Dr. Cornelius Moraswen. Echo this admit shows normally functioning valve. EF normal.   3. HTN: controlled on current regimen.   4. H/o Recurrent DVT: on Eliquis.   5. Hypothyroidism: on synthroid. TSH normal.   6. Morbid Obesity: Body mass index is 43.25 kg/m. needs to modify diet and increase physical activity.    For questions or updates, please contact [facility] Please consult [email or web address] for contact info under Cardiology/STEMI.      Signed, Robbie LisBrittainy Simmons, PA-C  11/18/2017, 12:44 PM      Patient seen and examined. Agree with assessment and plan. Diuresis slowed with lasix frequency reduction. No chest pain. Breathing better. Renal fxn stable with Cr 0.99. BNP improved at 191. Approaching euvolemia. Transition  to oral diuretic tomorrow.   Lennette Bihari, MD, [facility] 11/18/2017 1:55 PM

## 2017-11-18 NOTE — Progress Notes (Signed)
Physical Therapy Treatment Patient Details Name: Christine PattenJudith M Hicks MRN: [id number] DOB: 10/24/1955 Today's Date: 11/18/2017    History of Present Illness Patient is a 62 y.o. F with significant PMH of hypothyroidism, chronic diastolic dysfunction, AVR in mid 2018, and morbid obesity presenting with chest pain. Has been retaining fluid with 30-40 pound weight gain in recent 3-4 months.      PT Comments    Patient is progressing very well towards their physical therapy goals. Increased ambulation distance to 220 feet with RW; no rest breaks required. Patient remains motivated to increase mobility. Rest of session focused on bilateral lower extremity strengthening and encouraging elevation to decrease edema. Will continue to progress functional strengthening and endurance.    Follow Up Recommendations  Home health PT;Supervision for mobility/OOB     Equipment Recommendations  Rolling walker with 5" wheels    Recommendations for Other Services       Precautions / Restrictions Precautions Precautions: Fall Restrictions Weight Bearing Restrictions: No    Mobility  Bed Mobility               General bed mobility comments: OOB in recliner  Transfers Overall transfer level: Needs assistance Equipment used: Rolling walker (2 wheeled) Transfers: Sit to/from Stand Sit to Stand: Supervision         General transfer comment: Supervision for safety  Ambulation/Gait Ambulation/Gait assistance: Supervision Ambulation Distance (Feet): 220 Feet Assistive device: Rolling walker (2 wheeled) Gait Pattern/deviations: Step-through pattern;Wide base of support Gait velocity: 0.81 ft/s Gait velocity interpretation: <1.31 ft/sec, indicative of household ambulator General Gait Details: Min guard for safety but no instability. Patient with slow, steady pace. VC's for RW proximity;  patient not able to maintain   Stairs             Wheelchair Mobility    Modified Rankin (Stroke  Patients Only)       Balance Overall balance assessment: Needs assistance Sitting-balance support: No upper extremity supported;Feet supported Sitting balance-Leahy Scale: Good     Standing balance support: Bilateral upper extremity supported Standing balance-Leahy Scale: Fair                              Cognition Arousal/Alertness: Awake/alert Behavior During Therapy: WFL for tasks assessed/performed Overall Cognitive Status: Within Functional Limits for tasks assessed                                        Exercises General Exercises - Lower Extremity Long Arc Quad: 15 reps;Both;Seated Other Exercises Other Exercises: Seated towel squeeze x 10    General Comments        Pertinent Vitals/Pain Pain Assessment: Faces Faces Pain Scale: Hurts a little bit Pain Location: B LEs  Pain Descriptors / Indicators: Sore    Home Living                      Prior Function            PT Goals (current goals can now be found in the care plan section) Acute Rehab PT Goals Patient Stated Goal: get home and get outside to walk PT Goal Formulation: With patient Time For Goal Achievement: 11/24/17 Potential to Achieve Goals: Good Progress towards PT goals: Progressing toward goals    Frequency    Min 3X/week  PT Plan Current plan remains appropriate    Co-evaluation              AM-PAC PT "6 Clicks" Daily Activity  Outcome Measure  Difficulty turning over in bed (including adjusting bedclothes, sheets and blankets)?: A Little Difficulty moving from lying on back to sitting on the side of the bed? : Unable Difficulty sitting down on and standing up from a chair with arms (e.g., wheelchair, bedside commode, etc,.)?: Unable Help needed moving to and from a bed to chair (including a wheelchair)?: A Little Help needed walking in hospital room?: A Little Help needed climbing 3-5 steps with a railing? : A Lot 6 Click Score:  13    End of Session Equipment Utilized During Treatment: Gait belt Activity Tolerance: Patient tolerated treatment well Patient left: with call bell/phone within reach;in chair Nurse Communication: Mobility status PT Visit Diagnosis: Unsteadiness on feet (R26.81);Muscle weakness (generalized) (M62.81);Difficulty in walking, not elsewhere classified (R26.2)     Time: 0981-1914 PT Time Calculation (min) (ACUTE ONLY): 19 min  Charges:  $Gait Training: 8-22 mins                    G Codes:      Laurina Bustle, PT, DPT Acute Rehabilitation Services  Pager: [phone]   Vanetta Mulders 11/18/2017, 1:22 PM

## 2017-11-18 NOTE — Progress Notes (Signed)
PROGRESS NOTE  Christine PattenJudith M Brevik  [id number] DOB: 10/19/1955 DOA: 11/09/2017 PCP: Billee CashingMcKenzie, [name], MD  Outpatient Specialists: Cardiology, Katrinka BlazingSmith Brief Narrative: Christine Hicks is a 62 y.o. female with a history of chronic HFpEF, s/p AVR, hypothyroidism and morbid obesity (BMI ~50) who presented 4/15 with anasarca despite compliance with heart healthy diet and lasix at home. She noted ~30 lbs weight gain, dyspnea at rest, and increasing bilateral lower extremity and abdominal swelling. She was admitted, started on IV lasix. This was titrated upward with assistance by cardiology consultant, requiring lasix 80mg  IV TID and metolazone for diuresis which has been significant. Renal function improved with diuresis. Hypokalemia has been monitored and treated daily. Overall breathing and LE edema have improved with massive diuresis.   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 Suspect po lasix was no longer being absorbed at home. Chest x-ray on admission showed pulmonary edema, BNP was elevated and she has 2+ pitting lower extremity edema. Repeat echo with preserved EF, no mention of diastolic dysfunction; has pulm HTN/increased RV systolic pressure.  - Continue IV diuresis with no change in renal function. Defer to cardiology, though EDW thought previously to be ~265, though pt reports weighing 250lbs at her baseline. Would likely benefit from torsemide in the place of lasix on discharge.  - Lisinopril, metoprolol held with aggressive diuresis. BP stable.  - Daily weights, strict I/O, daily BMP - Telemetry stable.    Hypokalemia: Iatrogenic from aggressive diuresis. Improving with significant replacement.  - Give 40mEq today and recheck.     Rhinitis with postnasal drip/upper airway cough: Afebrile. - Flonase, cepacol. No indication for CXR. - Monitor fever curve.   Morbid obesity: BMI >40.  - Reports to be compliant with dietary modifications at home.  - Nutrition consulted, appreciate their education.  Hypertension: Normotensive, holding BP meds to make room for aggressive diuresis.  - Holding lisinopril, metoprolol - On lasix as above  Hypothyroidism: TSH 1.174 - Continue stable dose synthroid  History of DVTs: Current swelling also involves abdomen, do not suspect LE recurrence with anticoagulation adherence. - Continue eliquis  Status post aVR: Echo reassuring with normal function.  - Outpatient follow up - SBE prophylaxis as needed  Pulmonary hypertension - Recommend sleep study for suspected OSA based on habitus and symptoms.   DVT prophylaxis: Eliquis Code Status: Full Family Communication: None at bedside this AM Disposition Plan: Home when improved. Possibly 4/25.  Consultants:   Cardiology  Procedures:   Echocardiogram 4/16:  - Left ventricle: The cavity size was normal. There was severe   focal basal and mild concentric hypertrophy. Systolic function   was normal. The estimated ejection fraction was in the range of   60% to 65%. Wall motion was normal; there were no regional wall   motion abnormalities. Left ventricular diastolic function   parameters were normal. - Aortic valve: Trileaflet; normal thickness, mildly calcified   leaflets. Valve area (VTI): 0.87 cm^2. Valve area (Vmax): 0.73   cm^2. Valve area (Vmean): 0.8 cm^2. - Left atrium: The atrium was moderately dilated. - Right atrium: The atrium was severely dilated. - Tricuspid valve: There was severe regurgitation. -  Pulmonic valve: There was trivial regurgitation. - Pulmonary arteries: PA peak  pressure: 46 mm Hg (S).  Impressions: - The right ventricular systolic pressure was increased consistent   with moderate pulmonary hypertension.  Antimicrobials:  None  Subjective: Getting around easier with less fluid in legs. Denies significant shortness of breath. No chest pain. Normal BMs.  Objective: Vitals:   11/17/17 0836 11/17/17 2012 11/18/17 0620 11/18/17 0830  BP: 124/68 (!) 111/54  135/62  Pulse: 100 92    Resp:      Temp:      TempSrc:      SpO2: 95% 92%    Weight:   117.9 kg (259 lb 14.4 oz)   Height:        Intake/Output Summary (Last 24 hours) at 11/18/2017 1328 Last data filed at 11/18/2017 1000 Gross per 24 hour  Intake 240 ml  Output 750 ml  Net -510 ml   Filed Weights   11/16/17 0518 11/17/17 0512 11/18/17 0620  Weight: 122.6 kg (270 lb 4.8 oz) 120 kg (264 lb 9.6 oz) 117.9 kg (259 lb 14.4 oz)   Gen: Pleasant, obese female in no distress sitting in chair after working w/PT Pulm: Non-labored breathing on room air, clear bilaterally CV: Regular rate and rhythm. No murmur, rub, or gallop. PItting edema of the lower extremities about 1+, much improved. R > L still. GI: Abdomen soft, obese, non-tender, non-distended, with normoactive bowel sounds. No organomegaly or masses felt. Ext: Warm, no deformities Skin: Bilateral stasis dermatitis changes to lower legs. No wounds, fluctuance.  Neuro: Alert and oriented. No focal neurological deficits. Psych: Judgement and insight appear normal. Mood & affect appropriate.   CBC: Recent Labs  Lab 11/12/17 0416 11/13/17 0413  WBC 5.5 5.4  HGB 11.6* 11.7*  HCT 37.6 38.6  MCV 103.9* 104.6*  PLT 169 168   Basic Metabolic Panel: Recent Labs  Lab 11/14/17 0424 11/15/17 0702 11/16/17 0734 11/17/17 0531 11/18/17 0454  NA 140 138 134* 136 135  K 3.4* 2.6* 2.9* 3.5 3.4*  CL 102 96* 91* 94* 96*  CO2 26 30 31 30 29   GLUCOSE 87 90 96 92 99  BUN 16 15 13 17 18   CREATININE 1.08* 1.01* 0.90 0.94 0.99  CALCIUM  8.9 9.0 8.9 9.2 9.0  MG  --   --  1.9  --   --    Liver Function Tests: Recent Labs  Lab 11/14/17 0424  AST 20  ALT 9*  ALKPHOS 73  BILITOT 1.3*  PROT 6.7  ALBUMIN 3.2*    Time spent: 25 minutes.  Hazeline Junker, MD [facility] Pager [phone]  If 7PM-7AM, please contact night-coverage [email or web address] Password [email or web address] 11/18/2017, 1:28 PM

## 2017-11-19 LAB — BASIC METABOLIC PANEL
ANION GAP: 11 (ref 5–15)
ANION GAP: 11 (ref 5–15)
BUN: 17 mg/dL (ref 6–20)
BUN: 16 mg/dL (ref 6–20)
CALCIUM: 9 mg/dL (ref 8.9–10.3)
CALCIUM: 9.2 mg/dL (ref 8.9–10.3)
CO2: 28 mmol/L (ref 22–32)
CO2: 28 mmol/L (ref 22–32)
Chloride: 97 mmol/L — ABNORMAL LOW (ref 101–111)
Chloride: 97 mmol/L — ABNORMAL LOW (ref 101–111)
Creatinine, Ser: 0.84 mg/dL (ref 0.44–1.00)
Creatinine, Ser: 0.92 mg/dL (ref 0.44–1.00)
Glucose, Bld: 90 mg/dL (ref 65–99)
Glucose, Bld: 122 mg/dL — ABNORMAL HIGH (ref 65–99)
POTASSIUM: 2.9 mmol/L — AB (ref 3.5–5.1)
POTASSIUM: 3.8 mmol/L (ref 3.5–5.1)
SODIUM: 136 mmol/L (ref 135–145)
Sodium: 136 mmol/L (ref 135–145)

## 2017-11-19 LAB — MAGNESIUM: MAGNESIUM: 2 mg/dL (ref 1.7–2.4)

## 2017-11-19 MED ORDER — TORSEMIDE 20 MG PO TABS
40.0000 mg | ORAL_TABLET | Freq: Two times a day (BID) | ORAL | Status: DC
  Administered 2017-11-19 – 2017-11-20 (×2): 40 mg via ORAL
  Filled 2017-11-19 (×2): qty 2

## 2017-11-19 MED ORDER — FUROSEMIDE 80 MG PO TABS: 80.0000 mg | ORAL_TABLET | Freq: Two times a day (BID) | ORAL | Status: DC

## 2017-11-19 MED ORDER — POTASSIUM CHLORIDE CRYS ER 20 MEQ PO TBCR
30.0000 meq | EXTENDED_RELEASE_TABLET | ORAL | Status: AC
  Administered 2017-11-19 (×2): 30 meq via ORAL
  Filled 2017-11-19 (×2): qty 1

## 2017-11-19 MED ORDER — METOPROLOL TARTRATE 25 MG PO TABS
25.0000 mg | ORAL_TABLET | Freq: Two times a day (BID) | ORAL | Status: DC
  Administered 2017-11-19 – 2017-11-20 (×3): 25 mg via ORAL
  Filled 2017-11-19 (×3): qty 1

## 2017-11-19 NOTE — Progress Notes (Signed)
Devonne DoughtyKaren Kirby,NP notified of patient's potassium of 2.9 this am.

## 2017-11-19 NOTE — Progress Notes (Signed)
PROGRESS NOTE  Christine PattenJudith M Harland  ZOX:[id number] DOB: 12/13/1955 DOA: 11/09/2017 PCP: Billee CashingMcKenzie, [name], MD  Outpatient Specialists: Cardiology, Katrinka BlazingSmith Brief Narrative: Christine Hicks is a 62 y.o. female with a history of chronic HFpEF, s/p AVR, hypothyroidism and morbid obesity (BMI ~50) who presented 4/15 with anasarca despite compliance with heart healthy diet and lasix at home. She noted ~30 lbs weight gain, dyspnea at rest, and increasing bilateral lower extremity and abdominal swelling. She was admitted, started on IV lasix. This was titrated upward with assistance by cardiology consultant, requiring lasix 80mg  IV TID and metolazone for diuresis which has been significant. Renal function improved with diuresis. Hypokalemia has been monitored and treated daily. Overall breathing and LE edema have improved with massive diuresis.   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 Suspect po lasix was no longer being absorbed at home. Chest x-ray on admission showed pulmonary edema, BNP was elevated and she has 2+ pitting lower extremity edema. Repeat echo with preserved EF, no mention of diastolic dysfunction; has pulm HTN/increased RV systolic pressure.  - Now down to 257lbs with diuresis slowing on lasix 80mg  IV BID. Close to po transition. Discussed with cardiology. Would recommend torsemide given her previously failure with lasix. Ordered for 40mg  po BID (first dose tonight). Plan to follow up in AM and DC if this provides adequate diuresis. Feel she's close to EDW, creatinine continues to improve! - Lisinopril, metoprolol held  with aggressive diuresis. BP stable.  - Daily weights, strict I/O, daily BMP - Telemetry stable.   Hypokalemia: Iatrogenic from aggressive diuresis. Improving with significant replacement.  - Will replace po today, checking Mg as well.  - Recheck both in AM.   Rhinitis with postnasal drip/upper airway cough: Afebrile. - Flonase, cepacol. No indication for CXR. - Monitor fever curve.   Morbid obesity: BMI >40.  - Reports to be compliant with dietary modifications at home.  - Nutrition consulted, appreciate their education.  Hypertension: Normotensive, holding BP meds to make room for aggressive diuresis.  - Holding lisinopril, metoprolol - On lasix as above  Hypothyroidism: TSH 1.174 - Continue stable dose synthroid  History of DVTs: Current swelling also involves abdomen, do not suspect LE recurrence with anticoagulation adherence. - Continue eliquis  Status post aVR: Echo reassuring with normal function.  - Outpatient follow up - SBE prophylaxis as needed  Pulmonary hypertension - Recommend sleep study for suspected OSA based on habitus and symptoms.   DVT prophylaxis: Eliquis Code Status: Full Family Communication: None at bedside this AM Disposition Plan: Home when improved. Possibly 4/26 if stable on po torsemide.   Consultants:   Cardiology  Procedures:   Echocardiogram 4/16:  - Left ventricle: The cavity size was normal. There was severe   focal basal and mild concentric hypertrophy. Systolic function   was normal. The estimated ejection fraction was in the range of   60% to 65%. Wall motion was normal; there were no regional wall   motion abnormalities. Left ventricular diastolic function   parameters were normal. - Aortic valve: Trileaflet; normal thickness, mildly calcified   leaflets. Valve area (VTI): 0.87 cm^2. Valve area (Vmax): 0.73  cm^2. Valve area (Vmean): 0.8 cm^2. - Left atrium: The atrium was moderately dilated. - Right atrium: The  atrium was severely dilated. - Tricuspid valve: There was severe regurgitation. - Pulmonic valve: There was trivial regurgitation. - Pulmonary arteries: PA peak pressure: 46 mm Hg (S).  Impressions: - The right ventricular systolic pressure was increased consistent   with moderate pulmonary hypertension.  Antimicrobials:  None  Subjective: Like everyday I've come in to see how she doing she says "better." Breathing better today than yesterday, mobility better today than yesterday due to decreased swelling. No chest pain. Urinating less over past 24 hours  Objective: Vitals:   11/18/17 1441 11/18/17 2056 11/19/17 0623 11/19/17 1152  BP: 126/71 135/69 121/63 131/79  Pulse: (!) 107 (!) 106 86 (!) 105  Resp: 18 18 18    Temp: 98.1 F (36.7 C) 98.9 F (37.2 C) 98.3 F (36.8 C) 98 F (36.7 C)  TempSrc: Oral Oral Oral Oral  SpO2: 95% 95% 94% 96%  Weight:   117 kg (257 lb 14.4 oz)   Height:        Intake/Output Summary (Last 24 hours) at 11/19/2017 1440 Last data filed at 11/19/2017 1200 Gross per 24 hour  Intake 660 ml  Output 2601 ml  Net -1941 ml   Filed Weights   11/17/17 0512 11/18/17 0620 11/19/17 0623  Weight: 120 kg (264 lb 9.6 oz) 117.9 kg (259 lb 14.4 oz) 117 kg (257 lb 14.4 oz)   Gen: Pleasant, obese female in no distress sitting up after breakfast in chair. Pulm: Non-labored breathing on room air, clear without crackles or wheezes bilaterally. CV: Regular rate and rhythm. No murmur, rub, or gallop. LE edema significantly improved, mostly in R leg and foot which is chronic. Still ~1+.  GI: Abdomen soft, obese, non-tender, non-distended, with normoactive bowel sounds. No organomegaly or masses felt. Ext: Warm, no deformities Skin: Bilateral stasis dermatitis changes to lower legs. No wounds or fluctuance.  Neuro: Alert and oriented. No focal neurological deficits. Psych: Judgement and insight appear normal. Mood & affect appropriate.   CBC: Recent Labs  Lab  11/13/17 0413  WBC 5.4  HGB 11.7*  HCT 38.6  MCV 104.6*  PLT 168   Basic Metabolic Panel: Recent Labs  Lab 11/15/17 0702 11/16/17 0734 11/17/17 0531 11/18/17 0454 11/19/17 0346  NA 138 134* 136 135 136  K 2.6* 2.9* 3.5 3.4* 2.9*  CL 96* 91* 94* 96* 97*  CO2 30 31 30 29 28   GLUCOSE 90 96 92 99 90  BUN 15 13 17 18 17   CREATININE 1.01* 0.90 0.94 0.99 0.84  CALCIUM 9.0 8.9 9.2 9.0 9.0  MG  --  1.9  --   --   --    Liver Function Tests: Recent Labs  Lab 11/14/17 0424  AST 20  ALT 9*  ALKPHOS 73  BILITOT 1.3*  PROT 6.7  ALBUMIN 3.2*    Time spent: 25 minutes.  Hazeline Junker, MD [facility] Pager [phone]  If 7PM-7AM, please contact night-coverage [email or web address] Password [email or web address] 11/19/2017, 2:40 PM

## 2017-11-19 NOTE — Care Management Important Message (Signed)
Important Message  Patient Details  Name: Christine Hicks MRN: [id number] Date of Birth: 12/28/1955   Medicare Important Message Given:  Yes    Thomasena Vandenheuvel P Euline Kimbler 11/19/2017, 2:02 PM

## 2017-11-19 NOTE — Progress Notes (Addendum)
Progress Note  Patient Name: Christine Hicks Date of Encounter: 11/19/2017  Primary Cardiologist: Lesleigh Noe, MD   Subjective   Feeling much better. No dyspnea. She worked with PT yesterday and notes that she did well. Ambulating w/o much difficulty.   Inpatient Medications    Scheduled Meds: . apixaban  5 mg Oral BID  . aspirin EC  81 mg Oral Daily  . ferrous sulfate  325 mg Oral BID WC  . fluticasone  2 spray Each Nare Daily  . furosemide  80 mg Intravenous BID  . levothyroxine  150 mcg Oral QAC breakfast  . pantoprazole  40 mg Oral Daily  . sodium chloride flush  3 mL Intravenous Q12H   Continuous Infusions: . sodium chloride     PRN Meds: sodium chloride, acetaminophen, menthol-cetylpyridinium, ondansetron (ZOFRAN) IV, sodium chloride flush, traMADol, zolpidem   Vital Signs    Vitals:   11/18/17 0830 11/18/17 1441 11/18/17 2056 11/19/17 0623  BP: 135/62 126/71 135/69 121/63  Pulse:  (!) 107 (!) 106 86  Resp:  18 18 18   Temp:  98.1 F (36.7 C) 98.9 F (37.2 C) 98.3 F (36.8 C)  TempSrc:  Oral Oral Oral  SpO2:  95% 95% 94%  Weight:    257 lb 14.4 oz (117 kg)  Height:        Intake/Output Summary (Last 24 hours) at 11/19/2017 1152 Last data filed at 11/19/2017 0937 Gross per 24 hour  Intake 700 ml  Output 1601 ml  Net -901 ml   Filed Weights   11/17/17 0512 11/18/17 0620 11/19/17 0623  Weight: 264 lb 9.6 oz (120 kg) 259 lb 14.4 oz (117.9 kg) 257 lb 14.4 oz (117 kg)    Telemetry    Sinus tach - Personally Reviewed  ECG    NSR, RBBB - Personally Reviewed  Physical Exam   GEN: No acute distress.  Obese  Neck: No JVD Cardiac: RRR, no murmurs, rubs, or gallops.  Respiratory: Clear to auscultation bilaterally. GI: Soft, nontender, non-distended  MS: trace bilateral edema Neuro:  Nonfocal  Psych: Normal affect   Labs    Chemistry Recent Labs  Lab 11/14/17 0424  11/17/17 0531 11/18/17 0454 11/19/17 0346  NA 140   < > 136 135 136    K 3.4*   < > 3.5 3.4* 2.9*  CL 102   < > 94* 96* 97*  CO2 26   < > 30 29 28   GLUCOSE 87   < > 92 99 90  BUN 16   < > 17 18 17   CREATININE 1.08*   < > 0.94 0.99 0.84  CALCIUM 8.9   < > 9.2 9.0 9.0  PROT 6.7  --   --   --   --   ALBUMIN 3.2*  --   --   --   --   AST 20  --   --   --   --   ALT 9*  --   --   --   --   ALKPHOS 73  --   --   --   --   BILITOT 1.3*  --   --   --   --   GFRNONAA 53*   < > >60 59* >60  GFRAA >60   < > >60 >60 >60  ANIONGAP 12   < > 12 10 11    < > = values in this interval not displayed.  Hematology Recent Labs  Lab 11/13/17 0413  WBC 5.4  RBC 3.69*  HGB 11.7*  HCT 38.6  MCV 104.6*  MCH 31.7  MCHC 30.3  RDW 16.6*  PLT 168    Cardiac EnzymesNo results for input(s): TROPONINI in the last 168 hours. No results for input(s): TROPIPOC in the last 168 hours.   BNP Recent Labs  Lab 11/18/17 0454  BNP 191.3*     DDimer No results for input(s): DDIMER in the last 168 hours.   Radiology    No results found.  Cardiac Studies   Right Heart Cath and Coronary Angiography5/22/18  Conclusion    Critical aortic stenosis, on a bicuspid aortic valve with documented greater than 120 mm peak to peak and 85 mm mean gradient by recent echo.  Normal coronary arteries.  Normal pulmonary pressures. The capillary wedge pressure is 14 mmHg mean.   Acute on chronic diastolic heart failure, resolved after inpatient diuresis over the past 4 days.     2D Echo 11/10/17 Study Conclusions  - Left ventricle: The cavity size was normal. There was severe focal basal and mild concentric hypertrophy. Systolic function was normal. The estimated ejection fraction was in the range of 60% to 65%. Wall motion was normal; there were no regional wall motion abnormalities. Left ventricular diastolic function parameters were normal. - Aortic valve: Trileaflet; normal thickness, mildly calcified leaflets. Valve area (VTI): 0.87 cm^2. Valve area  (Vmax): 0.73 cm^2. Valve area (Vmean): 0.8 cm^2. - Left atrium: The atrium was moderately dilated. - Right atrium: The atrium was severely dilated. - Tricuspid valve: There was severe regurgitation. - Pulmonic valve: There was trivial regurgitation. - Pulmonary arteries: PA peak pressure: 46 mm Hg (S).  Impressions:  - The right ventricular systolic pressure was increased consistent with moderate pulmonary hypertension.   Patient Profile   62 y.o.femalewith a hx of h/ocongenital bicuspid aorticvalve s/p porcine AVR + aortic root replacement in 2018, chronic diastolic dysfunction, h/o recurrent DVT,chronic anticoagulation w/ Eliquis, HTN,hypothyroidism, OSA and morbid obesity, who is being seen  for the evaluation ofacute on chronic diastolic CHFat the request of Dr. Jarvis Newcomer, Internal Medicine.  Assessment & Plan    1. Acute on Chronic Diastolic CHF: admitted 11/09/17 with anasarca/ massively volume overloaded. She has responded well to IV Lasix. Massive diuresis -26L total since admit. Weight improved from 305 on admit to 257 lb today. Renal function and BP both stable. Supplement K. Will transition to PO diuretic. We will switch from previous lasix to torsemide for better GI absorption. Start torsemide 40 mg BID. Continue to monitor urine output, renal function, electrolytes and daily weights. Possible d/c home tomorrow if stable.   2. History of Congenital Bicuspid Valve w/ Severe Stenosis: s/p bioprosthetic aortic valve replacement with aortic root placement, per Dr. Cornelius Moras June 2018. Echo done this admission shows normally functioning valve. EF is normal.   3. Hypokalemia: K was 2.9 this morning. She was given 2 doses of Kdur for supplementation. 60 mEq total. Repeat BMP + Mg lab ordered for 1600. She will need additional supplementation if still deficient.    For questions or updates, please contact [facility] Please consult [email or web address] for contact info under  Cardiology/STEMI.      Signed, Robbie Lis, PA-C  11/19/2017, 11:52 AM     Patient seen and examined. Agree with assessment and plan. I/O -[phone] since admission with 48 pound weight loss.   No chest pain.  Agree with transition to oral therapy and with previous  reduced efficacy of furosemide initiate torsemide 40 mg twice a day.  Renal function remained stable with a creatinine at 0.84.  Significant hypokalemia.  K replete and recheck magnesium.  Heart rate increased to 130 bpm with minimal walking in the room.  With EF of 60 to 65%, will initiate metoprolol tartrate 25 mg twice a day.     Lennette Biharihomas A. Edelmiro Innocent, MD, [facility]vineFACC 11/19/2017 3:18 PM

## 2017-11-20 LAB — BASIC METABOLIC PANEL
ANION GAP: 13 (ref 5–15)
BUN: 22 mg/dL — AB (ref 6–20)
CHLORIDE: 96 mmol/L — AB (ref 101–111)
CO2: 26 mmol/L (ref 22–32)
Calcium: 9.1 mg/dL (ref 8.9–10.3)
Creatinine, Ser: 1.01 mg/dL — ABNORMAL HIGH (ref 0.44–1.00)
GFR calc non Af Amer: 58 mL/min — ABNORMAL LOW (ref >60)
Glucose, Bld: 118 mg/dL — ABNORMAL HIGH (ref 65–99)
POTASSIUM: 3.3 mmol/L — AB (ref 3.5–5.1)
SODIUM: 135 mmol/L (ref 135–145)

## 2017-11-20 LAB — MAGNESIUM: MAGNESIUM: 1.8 mg/dL (ref 1.7–2.4)

## 2017-11-20 MED ORDER — ZOLPIDEM TARTRATE 5 MG PO TABS: 5.0000 mg | ORAL_TABLET | Freq: Every evening | ORAL | 0 refills | Status: DC | PRN

## 2017-11-20 MED ORDER — TORSEMIDE 20 MG PO TABS: 40.0000 mg | ORAL_TABLET | Freq: Two times a day (BID) | ORAL | 0 refills | Status: DC

## 2017-11-20 MED ORDER — MAGNESIUM 400 MG PO CAPS: 400.0000 mg | ORAL_CAPSULE | Freq: Every day | ORAL | 0 refills | Status: DC

## 2017-11-20 MED ORDER — POTASSIUM CHLORIDE CRYS ER 20 MEQ PO TBCR
20.0000 meq | EXTENDED_RELEASE_TABLET | Freq: Once | ORAL | Status: AC
  Administered 2017-11-20: 20 meq via ORAL
  Filled 2017-11-20: qty 1

## 2017-11-20 MED ORDER — POTASSIUM CHLORIDE CRYS ER 20 MEQ PO TBCR: 40.0000 meq | EXTENDED_RELEASE_TABLET | Freq: Two times a day (BID) | ORAL | 3 refills | Status: DC

## 2017-11-20 NOTE — Progress Notes (Addendum)
[facility], Kathlene NovemberMike with [facility] called and made aware of discharge home today; rolling walker ordered and to be delivered to the room prior to discharging home today. Abelino DerrickB Aviel Davalos [employer] LLCRN,MHA,BSN [phone]

## 2017-11-20 NOTE — Progress Notes (Signed)
Xenia Blount,NP paged to notify of patient's potassium 3.3

## 2017-11-20 NOTE — Discharge Summary (Signed)
Physician Discharge Summary  Christine PattenJudith M Hicks [id number] DOB: 02/26/1956 DOA: 11/09/2017  PCP: [facility] will be doing home visits  Admit date: 11/09/2017 Discharge date: 11/20/2017  Time spent: 45 minutes  Recommendations for Outpatient Follow-up:  1. New medications torsemide, Ambien, magnesium--discontinued Lasix this admission 2. Discharge weight 116 kg BMI 42 3. Will need complete metabolic panel + magnesium done in about 1 week-CC to Dr. Katrinka BlazingSmith of cardiology and transition appointment set up as per Dr. Tresa EndoKelly on discharge 4. Coordination regarding OSA testing needed on discharge 5. 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 with stentless porcine aortic root graft    Hypokalemia   Discharge Condition: Improved  Diet recommendation: Heart healthy low-salt fluid restricted  Filed Weights   11/18/17 0620 11/19/17 0623 11/20/17 0422  Weight: 117.9 kg (259 lb 14.4 oz) 117 kg (257 lb 14.4 oz) 116.6 kg (257 lb)    History of present illness:  62 year old female Diastolic heart failure Porcine AVR valve status post repair Hypothyroid Super  BMI >50 admitted 4/15 with anasarca despite compliance with diet  Cardiology consulted patient placed on IV diuretics metolazone-hypokalemia was complicating her hospital stay  Hospital Course:   Acute predominant right-sided heart failure, suspected pulmonary hypertension-Baseline weight 260 on discharge placed on torsemide and reached, -28 L 257 pounds Cardiology made recommendations regarding medications lisinopril was discontinued metoprolol initially was held but then resumed at home dose heart rate was well controlled during  hospital stay  Hypokalemia iatrogenic-on discharge will be getting 40 mg twice daily potassium and magnesium was 1.8 on discharge therefore replacing with 400 mg daily to recheck in about 1 week at transition appointment  Probable allergic rhinitis continue topical Flonase no fever no chills  Super morbid obesity-life-threatening-outpatient counseling regarding OSA, questionable candidate for gastric restrictive procedure-please counsel as outpatient  History of lower extremity DVTs-continue Eliquis on discharge  Prior porcine AVR-outpatient follow-up SBE prophylaxis as as needed    Procedures:  echo  Study Conclusions  - Left ventricle: The cavity size was normal. There was severe   focal basal and mild concentric hypertrophy. Systolic function   was normal. The estimated ejection fraction was in the range of   60% to 65%. Wall motion was normal; there were no regional wall   motion abnormalities. Left ventricular diastolic function   parameters were normal. - Aortic valve: Trileaflet; normal thickness, mildly calcified   leaflets. Valve area (VTI): 0.87 cm^2. Valve area (Vmax): 0.73   cm^2. Valve area (Vmean): 0.8 cm^2. - Left atrium: The atrium was moderately dilated. - Right atrium: The atrium was severely dilated. - Tricuspid valve: There was severe regurgitation. - Pulmonic valve: There was trivial regurgitation. - Pulmonary arteries: PA peak pressure: 46 mm Hg (S).  Impressions:  - The right ventricular systolic pressure was increased consistent   with moderate pulmonary hypertension.  Consultations:  Cardiology  Discharge Exam: Vitals:   11/20/17 0422 11/20/17 0829  BP: (!) 96/59 98/60  Pulse: 67 97  Resp: 18 16  Temp: 99.3 F (37.4 C) 98.9 F (37.2 C)  SpO2: 94% 94%    General: Awake alert pleasant no distress morbidly obese Cardiovascular: S1-S2 holosystolic murmur Respiratory: Chest clear without added sound Mallampati  4 Mild lower extremity  edema with stasis dermatitis changes from lost fluid Abdomen soft  Discharge Instructions   Discharge Instructions    Diet - low sodium heart healthy   Complete by:  As directed    Discharge instructions   Complete by:  As directed    Please do not eat more than 2 grams of salt daily--limit fluid to 2 liters on very active and hot days and 1.8 liters on colder less active days If you gain >3 pounds in 24 hours, let your MD know Please get labs in 1 week-Cardiology will set you up with a Follow-up appointment Weigh daily on the same weighing scale   Increase activity slowly   Complete by:  As directed      Allergies as of 11/20/2017      Reactions   No Known Allergies       Medication List    STOP taking these medications   chlorhexidine 0.12 % solution Commonly known as:  PERIDEX   furosemide 40 MG tablet Commonly known as:  LASIX     TAKE these medications   aspirin EC 81 MG tablet Take 81 mg by mouth daily.   ELIQUIS 5 MG Tabs tablet Generic drug:  apixaban TAKE 1 TABLET BY MOUTH TWICE A DAY What changed:    how much to take  how to take this  when to take this   ferrous sulfate 325 (65 FE) MG tablet Take 1 tablet (325 mg total) by mouth 2 (two) times daily with a meal.   levothyroxine 150 MCG tablet Commonly known as:  SYNTHROID, LEVOTHROID Take 1 tablet (150 mcg total) by mouth daily before breakfast.   Magnesium 400 MG Caps Take 400 mg by mouth daily.   metoprolol tartrate 25 MG tablet Commonly known as:  LOPRESSOR Take 1 tablet (25 mg total) by mouth 2 (two) times daily.   pantoprazole 40 MG tablet Commonly known as:  PROTONIX Take 1 tablet (40 mg total) by mouth daily.   potassium chloride SA 20 MEQ tablet Commonly known as:  KLOR-CON M20 Take 2 tablets (40 mEq total) by mouth 2 (two) times daily. What changed:    how much to take  when to take this   torsemide 20 MG tablet Commonly known as:  DEMADEX Take 2 tablets (40 mg total) by  mouth 2 (two) times daily.   zolpidem 5 MG tablet Commonly known as:  AMBIEN Take 1 tablet (5 mg total) by mouth at bedtime as needed for sleep.            Durable Medical Equipment  (From admission, onward)        Start     Ordered   11/20/17 1041  For home use only DME Walker wide  [facility])  Once    Question:  Patient needs a walker to treat with the following condition  Answer:  Heart failure (HCC)   11/20/17 1042   11/11/17 1357  For home use only DME 4 wheeled rolling walker with seat  Once    Question:  Patient needs a walker to treat with the following condition  Answer:  Weakness   11/11/17 1357     Allergies  Allergen Reactions  . No Known Allergies    Follow-up Information    [facility] Follow up.   Specialty:  Home Health Services Why:  For Home Health. Dr Westley Hummer will provide primary care services Contact information: [address] [place] [address] [phone]        Lyn Records, MD Follow up on 12/01/2017.   Specialty:  Cardiology Why:  at 11:30 AM with Norma Fredrickson, NP  Contact information: [address] [place] [address] [phone]            The results of significant diagnostics from this hospitalization (including imaging, microbiology, ancillary and laboratory) are listed below for reference.    Significant Diagnostic Studies: Dg Chest 2 View  Result Date: 11/09/2017 CLINICAL DATA:  Chest pain and shortness of breath over the last month. Lower extremity swelling. EXAM: CHEST - 2 VIEW COMPARISON:  05/04/2017 FINDINGS: Previous median sternotomy. Chronic cardiomegaly. Pulmonary venous hypertension with mild interstitial edema. No infiltrate, collapse or large effusion. Small effusions in the posterior costophrenic angles. No acute bone finding. IMPRESSION: Congestive heart failure/fluid overload. Cardiomegaly. Venous hypertension. Mild interstitial edema. Small effusions.  Electronically Signed   By: Paulina Fusi M.D.   On: 11/09/2017 11:40    Microbiology: No results found for this or any previous visit (from the past 240 hour(s)).   Labs: Basic Metabolic Panel: Recent Labs  Lab 11/16/17 0734 11/17/17 0531 11/18/17 0454 11/19/17 0346 11/19/17 1530 11/20/17 0459  NA 134* 136 135 136 136 135  K 2.9* 3.5 3.4* 2.9* 3.8 3.3*  CL 91* 94* 96* 97* 97* 96*  CO2 31 30 29 28 28 26   GLUCOSE 96 92 99 90 122* 118*  BUN 13 17 18 17 16  22*  CREATININE 0.90 0.94 0.99 0.84 0.92 1.01*  CALCIUM 8.9 9.2 9.0 9.0 9.2 9.1  MG 1.9  --   --   --  2.0 1.8   Liver Function Tests: Recent Labs  Lab 11/14/17 0424  AST 20  ALT 9*  ALKPHOS 73  BILITOT 1.3*  PROT 6.7  ALBUMIN 3.2*   No results for input(s): LIPASE, AMYLASE in the last 168 hours. No results for input(s): AMMONIA in the last 168 hours. CBC: No results for input(s): WBC, NEUTROABS, HGB, HCT, MCV, PLT in the last 168 hours. Cardiac Enzymes: No results for input(s): CKTOTAL, CKMB, CKMBINDEX, TROPONINI in the last 168 hours. BNP: BNP (last 3 results) Recent Labs    12/11/16 1434 11/09/17 1213 11/18/17 0454  BNP 81.6 316.0* 191.3*    ProBNP (last 3 results) No results for input(s): PROBNP in the last 8760 hours.  CBG: No results for input(s): GLUCAP in the last 168 hours.     Signed:  Rhetta Mura MD   [facility] 11/20/2017, 10:48 AM

## 2017-11-24 ENCOUNTER — Telehealth: Payer: Self-pay | Admitting: Interventional Cardiology

## 2017-11-24 NOTE — Telephone Encounter (Signed)
NEW Message   Pt's son is calling to check on an FMLA paper work that was suppose to be filled out for work. Please call

## 2017-11-25 NOTE — Telephone Encounter (Signed)
Patient dropped off FMLA paperwork on 11/23/17. He has been made aware it takes 10/14 days for completion.  He will be called when ready for pick up.

## 2017-11-29 ENCOUNTER — Other Ambulatory Visit: Payer: Self-pay | Admitting: Cardiology

## 2017-11-30 ENCOUNTER — Encounter: Payer: Self-pay | Admitting: Nurse Practitioner

## 2017-11-30 NOTE — Progress Notes (Signed)
CARDIOLOGY OFFICE NOTE  Date:  12/01/2017    Christine Hicks Date of Birth: 30-May-1956 Medical Record #[id number]  PCP:  Annita Brod, MD  Cardiologist:  Kyra Manges    Chief Complaint  Patient presents with  . Congestive Heart Failure    Post hospital visit - seen for Dr. Katrinka Blazing    History of Present Illness: Christine Hicks is a 62 y.o. female who presents today for a post hospital visit. Seen for Dr. Katrinka Blazing.   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   Last seen here in August of 2018. At that time her weight was 268#.   Admitted last month with acute on chronic diastolic CHF. She was diuresed. She required metolazone. Hypokalemia was a complicating factor. Massive diuresis - 26 liters.   Comes in today. Here with son and husband. Weight at discharge at 257 - now at 265. She says she is doing better. Breathing has improved. Little cramps in her legs. Needs labs. Swelling has improved - not resolved - never is. She says she is now doing better with diet/fliud intake - this is the trigger for what led to her decompensation. She gets sleepy in the daytime. Some snoring. Never worked up for OSA - she is willing to have a sleep study. Doing better with salt restriction.  She says she "knows what I did wrong".   Past Medical History:  Diagnosis Date  . Acute on chronic diastolic congestive heart failure (HCC)   . Anemia, iron deficiency    negative egd/colonoscopy 11/16/2015  . Aortic stenosis, severe   . Arthritis   . Bicuspid aortic valve   . DVT (deep venous thrombosis) (HCC)    RLE DVT 11/12/15  . GERD (gastroesophageal reflux disease)   . Heart murmur   . Hypertension   . Insomnia   . Morbid obesity with BMI of 45.0-49.9, adult (HCC)   . PONV (postoperative  nausea and vomiting)    took a long time to wake up  . 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 Thyroid disease   . Wears glasses   . Wears partial dentures     Past Surgical History:  Procedure Laterality Date  . 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 AORTIC VALVE REPLACEMENT N/A 01/01/2017   Procedure: MINIMALLY INVASIVE AORTIC VALVE REPLACEMENT (AVR);  Surgeon: Purcell Nails, MD;  Location: [facility] OR;  Service: Open Heart Surgery;  Laterality: N/A;  . ASCENDING AORTIC ROOT REPLACEMENT N/A 01/01/2017   Procedure: ASCENDING AORTIC ROOT REPLACEMENT;  Surgeon: Purcell Nails, MD;  Location: [facility] OR;  Service: Open Heart Surgery;  Laterality: N/A;  . CARDIAC CATHETERIZATION     12/16/16  . COLONOSCOPY N/A 11/16/2015   Procedure: COLONOSCOPY;  Surgeon: Iva Boop, MD;  Location: [facility] ENDOSCOPY;  Service: Endoscopy;  Laterality: N/A;  . ESOPHAGOGASTRODUODENOSCOPY N/A 11/16/2015   Procedure: ESOPHAGOGASTRODUODENOSCOPY (EGD);  Surgeon: Iva Boop, MD;  Location: [facility] ENDOSCOPY;  Service: Endoscopy;  Laterality: N/A;  . HERNIA REPAIR    . MULTIPLE TOOTH EXTRACTIONS    . Right leg cellulitis surgery    .  TEE WITHOUT CARDIOVERSION N/A 01/01/2017   Procedure: TRANSESOPHAGEAL ECHOCARDIOGRAM (TEE);  Surgeon: Purcell Nails, MD;  Location: [facility] OR;  Service: Open Heart Surgery;  Laterality: N/A;     Medications: Current Meds  Medication Sig  . aspirin EC 81 MG tablet Take 81 mg by mouth daily.  Marland Kitchen ELIQUIS 5 MG TABS tablet TAKE 1 TABLET BY MOUTH TWICE A DAY (Patient taking differently: TAKE 1 TABLET ( ) BY MOUTH TWICE A DAY)  . ferrous sulfate 325 (65 FE) MG tablet Take 1 tablet (325 mg total) by mouth 2 (two) times daily with a meal.  . levothyroxine (SYNTHROID,  LEVOTHROID) 150 MCG tablet Take 1 tablet (150 mcg total) by mouth daily before breakfast.  . Magnesium 400 MG CAPS Take 400 mg by mouth daily.  . metoprolol tartrate (LOPRESSOR) 25 MG tablet TAKE 1 TABLET BY MOUTH TWICE A DAY  . pantoprazole (PROTONIX) 40 MG tablet Take 1 tablet (40 mg total) by mouth daily.  . potassium chloride SA (KLOR-CON M20) 20 MEQ tablet Take 2 tablets (40 mEq total) by mouth 2 (two) times daily.  Marland Kitchen torsemide (DEMADEX) 20 MG tablet Take 2 tablets (40 mg total) by mouth 2 (two) times daily.  Marland Kitchen zolpidem (AMBIEN) 5 MG tablet Take 1 tablet (5 mg total) by mouth at bedtime as needed for sleep.     Allergies: Allergies  Allergen Reactions  . No Known Allergies     Social History: The patient  reports that she has quit smoking. Her smoking use included cigarettes. She has never used smokeless tobacco. She reports that she does not drink alcohol or use drugs.   Family History: The patient's family history includes Cardiomyopathy in her son; Emphysema in her father; Heart attack in her mother; Lung cancer in her maternal aunt.   Review of Systems: Please see the history of present illness.   Otherwise, the review of systems is positive for none.   All other systems are reviewed and negative.   Physical Exam: VS:  Pulse (!) 57   Ht  (1.651 m)   Wt 265 lb 12.8 oz (120.6 kg)   LMP  (LMP Unknown)   SpO2 100% Comment: at rest  BMI 44.23 kg/m  .  BMI Body mass index is 44.23 kg/m.  Wt Readings from Last 3 Encounters:  12/01/17 265 lb 12.8 oz (120.6 kg)  11/20/17 257 lb (116.6 kg)  05/04/17 272 lb (123.4 kg)   BP is 94 palpated by me.   General: Chronically ill. Morbidly obese. Alert and in no acute distress.  Using a walker - moving slow.  HEENT: Normal.  Neck: Supple, + JVD - V waves noted.  Cardiac: Regular rate and rhythm. Heart tones are distant. Still with significant edema but reported improvement.  Respiratory:  Lungs are clear to auscultation  bilaterally with normal work of breathing.  GI: Soft and nontender.  MS: No deformity or atrophy. Gait and ROM intact.Moving slow with walker.  Skin: Warm and dry. Color is normal.  Neuro:  Strength and sensation are intact and no gross focal deficits noted.  Psych: Alert, appropriate and with normal affect.   LABORATORY DATA:  EKG:  EKG is not ordered today.  Lab Results  Component Value Date   WBC 5.4 11/13/2017   HGB 11.7 (L) 11/13/2017   HCT 38.6 11/13/2017   PLT 168 11/13/2017   GLUCOSE 118 (H) 11/20/2017   CHOL  09/11/2009    124  ATP III CLASSIFICATION:  <200     mg/dL   Desirable  540-981  mg/dL   Borderline High  >=191    mg/dL   High          TRIG 478 (H) 09/11/2009   HDL 14 (L) 09/11/2009   LDLCALC  09/11/2009    70        Total Cholesterol/HDL:CHD Risk Coronary Heart Disease Risk Table                     Men   Women  1/2 Average Risk   3.4   3.3  Average Risk       5.0   4.4  2 X Average Risk   9.6   7.1  3 X Average Risk  23.4   11.0        Use the calculated Patient Ratio above and the CHD Risk Table to determine the patient's CHD Risk.        ATP III CLASSIFICATION (LDL):  <100     mg/dL   Optimal  295-621  mg/dL   Near or Above                    Optimal  130-159  mg/dL   Borderline  308-657  mg/dL   High  >846     mg/dL   Very High   ALT 9 (L) 11/14/2017   AST 20 11/14/2017   NA 135 11/20/2017   K 3.3 (L) 11/20/2017   CL 96 (L) 11/20/2017   CREATININE 1.01 (H) 11/20/2017   BUN 22 (H) 11/20/2017   CO2 26 11/20/2017   TSH 1.174 11/13/2017   INR 2.04 11/09/2017   HGBA1C 5.5 12/30/2016     BNP (last 3 results) Recent Labs    12/11/16 1434 11/09/17 1213 11/18/17 0454  BNP 81.6 316.0* 191.3*    ProBNP (last 3 results) No results for input(s): PROBNP in the last 8760 hours.   Other Studies Reviewed Today:  2D Echo 11-15-17 Study Conclusions  - Left ventricle: The cavity size was normal. There was severe focal basal  and mild concentric hypertrophy. Systolic function was normal. The estimated ejection fraction was in the range of 60% to 65%. Wall motion was normal; there were no regional wall motion abnormalities. Left ventricular diastolic function parameters were normal. - Aortic valve: Trileaflet; normal thickness, mildly calcified leaflets. Valve area (VTI): 0.87 cm^2. Valve area (Vmax): 0.73 cm^2. Valve area (Vmean): 0.8 cm^2. - Left atrium: The atrium was moderately dilated. - Right atrium: The atrium was severely dilated. - Tricuspid valve: There was severe regurgitation. - Pulmonic valve: There was trivial regurgitation. - Pulmonary arteries: PA peak pressure: 46 mm Hg (S).  Impressions:  - The right ventricular systolic pressure was increased consistent with moderate pulmonary hypertension.   Right Heart Cath and Coronary Angiography5/22/18  Conclusion    Critical aortic stenosis, on a bicuspid aortic valve with documented greater than 120 mm peak to peak and 85 mm mean gradient by recent echo.  Normal coronary arteries.  Normal pulmonary pressures. The capillary wedge pressure is 14 mmHg mean.   Acute on chronic diastolic heart failure, resolved after inpatient diuresis over the past 4 days.       Assessment/Plan:   1. Acute on Chronic Diastolic CHF: admitted 11/09/17 with anasarca/ massively volume overloaded. Now switched over to Torsemide. She had significant dietary indiscretion and more than likely has some degree of OSA playing a  role. Lab today. BP is soft - not really symptomatic. Will follow for now.   2. History of Congenital Bicuspid Valve w/ Severe Stenosis: s/p bioprosthetic aortic valve replacement with aortic root placement, per Dr. Cornelius Moras June 2018. Echo done this admission shows normally functioning valve. EF is normal.   3. Hypokalemia: Needs rechecking lab today.   4. Probable OSA - will try to arrange sleep study.   5. Massive  obesity.   Current medicines are reviewed with the patient today.  The patient does not have concerns regarding medicines other than what has been noted above.  The following changes have been made:  See above.  Labs/ tests ordered today include:    Orders Placed This Encounter  Procedures  . Basic metabolic panel     Disposition:   FU with me in a month - see Dr. Katrinka Blazing in 3 months.  The patient was seen along with Dr. Katrinka Blazing today as well.   Patient is agreeable to this plan and will call if any problems develop in the interim.   SignedNorma Fredrickson, NP  12/01/2017 12:30 PM  [facility] [address] [place], [place]  [address] Phone: [phone] Fax: [phone]

## 2017-11-30 NOTE — Telephone Encounter (Signed)
REFILL 

## 2017-12-01 ENCOUNTER — Encounter: Payer: Self-pay | Admitting: Nurse Practitioner

## 2017-12-01 ENCOUNTER — Ambulatory Visit (INDEPENDENT_AMBULATORY_CARE_PROVIDER_SITE_OTHER): Payer: BC Managed Care – PPO | Admitting: Nurse Practitioner

## 2017-12-01 ENCOUNTER — Other Ambulatory Visit: Payer: Self-pay | Admitting: *Deleted

## 2017-12-01 VITALS — HR 57 | Ht 65.0 in | Wt 265.8 lb

## 2017-12-01 DIAGNOSIS — I5033 Acute on chronic diastolic (congestive) heart failure: Secondary | ICD-10-CM

## 2017-12-01 DIAGNOSIS — Z954 Presence of other heart-valve replacement: Secondary | ICD-10-CM | POA: Diagnosis not present

## 2017-12-01 DIAGNOSIS — R0683 Snoring: Secondary | ICD-10-CM

## 2017-12-01 NOTE — Patient Instructions (Addendum)
We will be checking the following labs today - BMET   Medication Instructions:    Continue with your current medicines.     Testing/Procedures To Be Arranged:  N/A  Follow-Up:   See Vernona Rieger or me in a month  See Dr. Katrinka Blazing in 3 months    Other Special Instructions:   Arrange outpatient sleep study    If you need a refill on your cardiac medications before your next appointment, please call your pharmacy.   Call the [facility] office at [phone] if you have any questions, problems or concerns.

## 2017-12-02 LAB — BASIC METABOLIC PANEL
BUN/Creatinine Ratio: 23 (ref 12–28)
BUN: 26 mg/dL (ref 8–27)
CO2: 23 mmol/L (ref 20–29)
Calcium: 9.2 mg/dL (ref 8.7–10.3)
Chloride: 104 mmol/L (ref 96–106)
Creatinine, Ser: 1.15 mg/dL — ABNORMAL HIGH (ref 0.57–1.00)
GFR calc Af Amer: 59 mL/min/{1.73_m2} — ABNORMAL LOW (ref >59)
GFR calc non Af Amer: 51 mL/min/{1.73_m2} — ABNORMAL LOW (ref >59)
Glucose: 102 mg/dL — ABNORMAL HIGH (ref 65–99)
Potassium: 4.7 mmol/L (ref 3.5–5.2)
Sodium: 143 mmol/L (ref 134–144)

## 2017-12-03 ENCOUNTER — Telehealth: Payer: Self-pay

## 2017-12-03 ENCOUNTER — Telehealth: Payer: Self-pay | Admitting: *Deleted

## 2017-12-03 NOTE — Telephone Encounter (Signed)
Patient is scheduled for lab study on Tuesday 12/29/17. Patient understands her sleep study will be done at [facility] sleep lab. Patient understands she will receive a sleep packet in a week or so. Patient understands to call if she does not receive the sleep packet in a timely manner. Patient agrees with treatment and thanked me for call.

## 2017-12-03 NOTE — Telephone Encounter (Signed)
Call received from the patient stating that she was given a walker but her home health nurse is recommending a different walker.   Informed her that she is not being followed at [facility] and needs to speak to her PCP - Dr Westley Hummer.  She stated that she understood.

## 2017-12-03 NOTE — Telephone Encounter (Signed)
-----   Message from Gaynelle Cage, CMA sent at 12/01/2017  3:10 PM EDT ----- Per [employer] no PA required. Ok to schedule sleep study. ----- Message ----- From: Debbe Bales Sent: 12/01/2017  12:20 PM To: [facility] Sleep Studies  Please arrange for this pt to have sleep study.  Thanks danielle

## 2017-12-04 ENCOUNTER — Other Ambulatory Visit: Payer: Self-pay | Admitting: Interventional Cardiology

## 2017-12-08 ENCOUNTER — Ambulatory Visit: Payer: Self-pay | Admitting: Cardiology

## 2017-12-09 ENCOUNTER — Telehealth: Payer: Self-pay | Admitting: Nurse Practitioner

## 2017-12-09 NOTE — Telephone Encounter (Signed)
New Message   Zollie Beckers, pt's son is calling wanting to know what was stated on the letter so that he can know what to tell his job. Please call

## 2017-12-11 ENCOUNTER — Other Ambulatory Visit: Payer: Self-pay | Admitting: Interventional Cardiology

## 2017-12-28 ENCOUNTER — Other Ambulatory Visit: Payer: Self-pay | Admitting: *Deleted

## 2017-12-28 ENCOUNTER — Telehealth: Payer: Self-pay | Admitting: Physician Assistant

## 2017-12-28 MED ORDER — TORSEMIDE 20 MG PO TABS: 60.0000 mg | ORAL_TABLET | Freq: Two times a day (BID) | ORAL | 3 refills | Status: DC

## 2017-12-28 NOTE — Telephone Encounter (Signed)
The home health nurse had gotten a call from Ms. Kinder about increasing lower extremity edema and dyspnea on exertion.  She stated her weight was up about 12 pounds.  I discussed the patient's medications with  Joni ReiningNicole.  I requested that the patient take an extra 40 mg of torsemide now.  Increase the standing dose of torsemide to 60 mg twice daily.  I will route this to Norma FredricksonLori Gerhardt and Dr. Katrinka BlazingSmith to make sure they are aware.  Her appointment might need to be moved up.  Home health RN is to see the patient Tuesday and check a BMET.  Theodore Demarkhonda Barrett, PA-C

## 2017-12-28 NOTE — Telephone Encounter (Signed)
S/w pt has cut salt way back and has increased torsemide (60 mg ) bid.  Confirmed pt's appt next week.

## 2017-12-28 NOTE — Telephone Encounter (Signed)
Thanks

## 2017-12-28 NOTE — Telephone Encounter (Signed)
Noted and agree with current recommendation.  She is seeing me next week.  Needs strict salt restriction.  If fails to improve will need to be readmitted.

## 2017-12-29 ENCOUNTER — Ambulatory Visit (HOSPITAL_BASED_OUTPATIENT_CLINIC_OR_DEPARTMENT_OTHER): Payer: Medicare Other | Admitting: Cardiology

## 2017-12-29 DIAGNOSIS — I493 Ventricular premature depolarization: Secondary | ICD-10-CM | POA: Insufficient documentation

## 2017-12-29 DIAGNOSIS — Z6841 Body Mass Index (BMI) 40.0 and over, adult: Secondary | ICD-10-CM

## 2017-12-29 DIAGNOSIS — R0683 Snoring: Secondary | ICD-10-CM

## 2017-12-29 DIAGNOSIS — I10 Essential (primary) hypertension: Secondary | ICD-10-CM | POA: Insufficient documentation

## 2017-12-29 DIAGNOSIS — R5383 Other fatigue: Secondary | ICD-10-CM

## 2017-12-29 DIAGNOSIS — I130 Hypertensive heart and chronic kidney disease with heart failure and stage 1 through stage 4 chronic kidney disease, or unspecified chronic kidney disease: Secondary | ICD-10-CM | POA: Diagnosis not present

## 2017-12-29 DIAGNOSIS — I5023 Acute on chronic systolic (congestive) heart failure: Secondary | ICD-10-CM | POA: Diagnosis not present

## 2017-12-31 ENCOUNTER — Inpatient Hospital Stay (HOSPITAL_COMMUNITY)
Admission: EM | Admit: 2017-12-31 | Discharge: 2018-01-14 | DRG: 286 | Disposition: A | Discharge status: Home Health | Payer: Medicare Other | Attending: Family Medicine | Admitting: Family Medicine

## 2017-12-31 ENCOUNTER — Telehealth: Payer: Self-pay | Admitting: Interventional Cardiology

## 2017-12-31 ENCOUNTER — Emergency Department (HOSPITAL_COMMUNITY): Payer: Medicare Other

## 2017-12-31 ENCOUNTER — Other Ambulatory Visit: Payer: Self-pay

## 2017-12-31 ENCOUNTER — Encounter (HOSPITAL_COMMUNITY): Payer: Self-pay | Admitting: Emergency Medicine

## 2017-12-31 DIAGNOSIS — Z825 Family history of asthma and other chronic lower respiratory diseases: Secondary | ICD-10-CM

## 2017-12-31 DIAGNOSIS — D509 Iron deficiency anemia, unspecified: Secondary | ICD-10-CM | POA: Diagnosis present

## 2017-12-31 DIAGNOSIS — N179 Acute kidney failure, unspecified: Secondary | ICD-10-CM | POA: Diagnosis not present

## 2017-12-31 DIAGNOSIS — E876 Hypokalemia: Secondary | ICD-10-CM | POA: Diagnosis not present

## 2017-12-31 DIAGNOSIS — E039 Hypothyroidism, unspecified: Secondary | ICD-10-CM | POA: Diagnosis not present

## 2017-12-31 DIAGNOSIS — E538 Deficiency of other specified B group vitamins: Secondary | ICD-10-CM | POA: Diagnosis present

## 2017-12-31 DIAGNOSIS — I4510 Unspecified right bundle-branch block: Secondary | ICD-10-CM | POA: Diagnosis present

## 2017-12-31 DIAGNOSIS — I5033 Acute on chronic diastolic (congestive) heart failure: Secondary | ICD-10-CM | POA: Diagnosis not present

## 2017-12-31 DIAGNOSIS — Z6841 Body Mass Index (BMI) 40.0 and over, adult: Secondary | ICD-10-CM

## 2017-12-31 DIAGNOSIS — I071 Rheumatic tricuspid insufficiency: Secondary | ICD-10-CM | POA: Diagnosis present

## 2017-12-31 DIAGNOSIS — K219 Gastro-esophageal reflux disease without esophagitis: Secondary | ICD-10-CM | POA: Diagnosis present

## 2017-12-31 DIAGNOSIS — N182 Chronic kidney disease, stage 2 (mild): Secondary | ICD-10-CM | POA: Diagnosis present

## 2017-12-31 DIAGNOSIS — G4733 Obstructive sleep apnea (adult) (pediatric): Secondary | ICD-10-CM | POA: Diagnosis present

## 2017-12-31 DIAGNOSIS — D500 Iron deficiency anemia secondary to blood loss (chronic): Secondary | ICD-10-CM | POA: Diagnosis not present

## 2017-12-31 DIAGNOSIS — G252 Other specified forms of tremor: Secondary | ICD-10-CM | POA: Diagnosis present

## 2017-12-31 DIAGNOSIS — R5381 Other malaise: Secondary | ICD-10-CM | POA: Diagnosis present

## 2017-12-31 DIAGNOSIS — R296 Repeated falls: Secondary | ICD-10-CM | POA: Diagnosis present

## 2017-12-31 DIAGNOSIS — Z8249 Family history of ischemic heart disease and other diseases of the circulatory system: Secondary | ICD-10-CM

## 2017-12-31 DIAGNOSIS — I82503 Chronic embolism and thrombosis of unspecified deep veins of lower extremity, bilateral: Secondary | ICD-10-CM | POA: Diagnosis present

## 2017-12-31 DIAGNOSIS — I82409 Acute embolism and thrombosis of unspecified deep veins of unspecified lower extremity: Secondary | ICD-10-CM | POA: Diagnosis present

## 2017-12-31 DIAGNOSIS — Z86711 Personal history of pulmonary embolism: Secondary | ICD-10-CM

## 2017-12-31 DIAGNOSIS — Z953 Presence of xenogenic heart valve: Secondary | ICD-10-CM | POA: Diagnosis not present

## 2017-12-31 DIAGNOSIS — I2721 Secondary pulmonary arterial hypertension: Secondary | ICD-10-CM | POA: Diagnosis not present

## 2017-12-31 DIAGNOSIS — I50810 Right heart failure, unspecified: Secondary | ICD-10-CM | POA: Diagnosis not present

## 2017-12-31 DIAGNOSIS — Z79890 Hormone replacement therapy (postmenopausal): Secondary | ICD-10-CM

## 2017-12-31 DIAGNOSIS — I10 Essential (primary) hypertension: Secondary | ICD-10-CM | POA: Diagnosis present

## 2017-12-31 DIAGNOSIS — I5023 Acute on chronic systolic (congestive) heart failure: Secondary | ICD-10-CM

## 2017-12-31 DIAGNOSIS — R52 Pain, unspecified: Secondary | ICD-10-CM

## 2017-12-31 DIAGNOSIS — R001 Bradycardia, unspecified: Secondary | ICD-10-CM | POA: Diagnosis present

## 2017-12-31 DIAGNOSIS — Z7901 Long term (current) use of anticoagulants: Secondary | ICD-10-CM

## 2017-12-31 DIAGNOSIS — Z87891 Personal history of nicotine dependence: Secondary | ICD-10-CM

## 2017-12-31 DIAGNOSIS — R609 Edema, unspecified: Secondary | ICD-10-CM | POA: Diagnosis not present

## 2017-12-31 DIAGNOSIS — I872 Venous insufficiency (chronic) (peripheral): Secondary | ICD-10-CM | POA: Diagnosis present

## 2017-12-31 DIAGNOSIS — M810 Age-related osteoporosis without current pathological fracture: Secondary | ICD-10-CM | POA: Diagnosis present

## 2017-12-31 DIAGNOSIS — M109 Gout, unspecified: Secondary | ICD-10-CM | POA: Diagnosis present

## 2017-12-31 DIAGNOSIS — I130 Hypertensive heart and chronic kidney disease with heart failure and stage 1 through stage 4 chronic kidney disease, or unspecified chronic kidney disease: Secondary | ICD-10-CM | POA: Diagnosis not present

## 2017-12-31 DIAGNOSIS — I509 Heart failure, unspecified: Secondary | ICD-10-CM | POA: Diagnosis not present

## 2017-12-31 DIAGNOSIS — Z7982 Long term (current) use of aspirin: Secondary | ICD-10-CM

## 2017-12-31 LAB — CBC
HCT: 34.5 % — ABNORMAL LOW (ref 36.0–46.0)
Hemoglobin: 10.5 g/dL — ABNORMAL LOW (ref 12.0–15.0)
MCH: 32.6 pg (ref 26.0–34.0)
MCHC: 30.4 g/dL (ref 30.0–36.0)
MCV: 107.1 fL — AB (ref 78.0–100.0)
PLATELETS: 239 10*3/uL (ref 150–400)
RBC: 3.22 MIL/uL — AB (ref 3.87–5.11)
RDW: 16.7 % — AB (ref 11.5–15.5)
WBC: 6.1 10*3/uL (ref 4.0–10.5)

## 2017-12-31 LAB — I-STAT TROPONIN, ED: Troponin i, poc: 0 ng/mL (ref 0.00–0.08)

## 2017-12-31 LAB — BRAIN NATRIURETIC PEPTIDE: B NATRIURETIC PEPTIDE 5: 408.6 pg/mL — AB (ref 0.0–100.0)

## 2017-12-31 LAB — BASIC METABOLIC PANEL
Anion gap: 9 (ref 5–15)
BUN: 27 mg/dL — AB (ref 6–20)
CHLORIDE: 107 mmol/L (ref 101–111)
CO2: 25 mmol/L (ref 22–32)
CREATININE: 1.45 mg/dL — AB (ref 0.44–1.00)
Calcium: 8.8 mg/dL — ABNORMAL LOW (ref 8.9–10.3)
GFR calc Af Amer: 43 mL/min — ABNORMAL LOW (ref >60)
GFR calc non Af Amer: 37 mL/min — ABNORMAL LOW (ref >60)
Glucose, Bld: 108 mg/dL — ABNORMAL HIGH (ref 65–99)
POTASSIUM: 3.8 mmol/L (ref 3.5–5.1)
SODIUM: 141 mmol/L (ref 135–145)

## 2017-12-31 MED ORDER — MAGNESIUM OXIDE 400 (241.3 MG) MG PO TABS
400.0000 mg | ORAL_TABLET | Freq: Every day | ORAL | Status: DC
  Administered 2018-01-01 – 2018-01-14 (×14): 400 mg via ORAL
  Filled 2017-12-31 (×14): qty 1

## 2017-12-31 MED ORDER — SODIUM CHLORIDE 0.9% FLUSH
3.0000 mL | INTRAVENOUS | Status: DC | PRN
  Administered 2018-01-03 – 2018-01-05 (×3): 3 mL via INTRAVENOUS
  Filled 2017-12-31 (×3): qty 3

## 2017-12-31 MED ORDER — FUROSEMIDE 10 MG/ML IJ SOLN
60.0000 mg | Freq: Once | INTRAMUSCULAR | Status: AC
  Administered 2017-12-31: 60 mg via INTRAVENOUS
  Filled 2017-12-31: qty 6

## 2017-12-31 MED ORDER — ACETAMINOPHEN 325 MG PO TABS
650.0000 mg | ORAL_TABLET | Freq: Four times a day (QID) | ORAL | Status: DC | PRN
  Administered 2017-12-31 – 2018-01-10 (×11): 650 mg via ORAL
  Filled 2017-12-31 (×11): qty 2

## 2017-12-31 MED ORDER — HYDROXYZINE HCL 10 MG PO TABS: 10.0000 mg | ORAL_TABLET | Freq: Three times a day (TID) | ORAL | Status: DC | PRN

## 2017-12-31 MED ORDER — ASPIRIN EC 81 MG PO TBEC: 81.0000 mg | DELAYED_RELEASE_TABLET | Freq: Every day | ORAL | Status: DC

## 2017-12-31 MED ORDER — SODIUM CHLORIDE 0.9 % IV SOLN: 250.0000 mL | INTRAVENOUS | Status: DC | PRN

## 2017-12-31 MED ORDER — ASPIRIN EC 81 MG PO TBEC
81.0000 mg | DELAYED_RELEASE_TABLET | Freq: Every day | ORAL | Status: DC
  Administered 2018-01-01 – 2018-01-14 (×14): 81 mg via ORAL
  Filled 2017-12-31 (×15): qty 1

## 2017-12-31 MED ORDER — ZOLPIDEM TARTRATE 5 MG PO TABS
5.0000 mg | ORAL_TABLET | Freq: Every evening | ORAL | Status: DC | PRN
  Administered 2018-01-01 – 2018-01-13 (×10): 5 mg via ORAL
  Filled 2017-12-31 (×12): qty 1

## 2017-12-31 MED ORDER — LEVOTHYROXINE SODIUM 175 MCG PO TABS: 175.0000 ug | ORAL_TABLET | Freq: Every day | ORAL | Status: DC

## 2017-12-31 MED ORDER — SODIUM CHLORIDE 0.9% FLUSH
3.0000 mL | Freq: Two times a day (BID) | INTRAVENOUS | Status: DC
  Administered 2018-01-01 – 2018-01-10 (×18): 3 mL via INTRAVENOUS

## 2017-12-31 MED ORDER — METOPROLOL TARTRATE 25 MG PO TABS
25.0000 mg | ORAL_TABLET | Freq: Two times a day (BID) | ORAL | Status: DC
  Administered 2018-01-01 (×2): 25 mg via ORAL
  Filled 2017-12-31 (×2): qty 1

## 2017-12-31 MED ORDER — FERROUS SULFATE 325 (65 FE) MG PO TABS
325.0000 mg | ORAL_TABLET | Freq: Two times a day (BID) | ORAL | Status: DC
  Administered 2018-01-01 – 2018-01-14 (×27): 325 mg via ORAL
  Filled 2017-12-31 (×27): qty 1

## 2017-12-31 MED ORDER — PANTOPRAZOLE SODIUM 40 MG PO TBEC
40.0000 mg | DELAYED_RELEASE_TABLET | Freq: Every day | ORAL | Status: DC
  Administered 2018-01-01 – 2018-01-14 (×14): 40 mg via ORAL
  Filled 2017-12-31 (×15): qty 1

## 2017-12-31 MED ORDER — LEVOTHYROXINE SODIUM 75 MCG PO TABS
175.0000 ug | ORAL_TABLET | Freq: Every day | ORAL | Status: DC
  Administered 2018-01-01 – 2018-01-14 (×14): 175 ug via ORAL
  Filled 2017-12-31 (×15): qty 1

## 2017-12-31 MED ORDER — APIXABAN 5 MG PO TABS
5.0000 mg | ORAL_TABLET | Freq: Two times a day (BID) | ORAL | Status: DC
  Administered 2018-01-01 (×2): 5 mg via ORAL
  Filled 2017-12-31 (×2): qty 1

## 2017-12-31 MED ORDER — HYDRALAZINE HCL 20 MG/ML IJ SOLN: 5.0000 mg | INTRAMUSCULAR | Status: DC | PRN

## 2017-12-31 MED ORDER — HYDROCODONE-ACETAMINOPHEN 5-325 MG PO TABS
1.0000 | ORAL_TABLET | Freq: Four times a day (QID) | ORAL | Status: DC | PRN
  Administered 2018-01-01 (×2): 2 via ORAL
  Administered 2018-01-04 – 2018-01-12 (×10): 1 via ORAL
  Administered 2018-01-13 – 2018-01-14 (×2): 2 via ORAL
  Filled 2017-12-31: qty 2
  Filled 2017-12-31 (×2): qty 1
  Filled 2017-12-31: qty 2
  Filled 2017-12-31: qty 1
  Filled 2017-12-31: qty 2
  Filled 2017-12-31 (×5): qty 1
  Filled 2017-12-31: qty 2
  Filled 2017-12-31 (×2): qty 1

## 2017-12-31 MED ORDER — FUROSEMIDE 10 MG/ML IJ SOLN: 60.0000 mg | Freq: Two times a day (BID) | INTRAMUSCULAR | Status: DC

## 2017-12-31 MED ORDER — FUROSEMIDE 10 MG/ML IJ SOLN
60.0000 mg | Freq: Two times a day (BID) | INTRAMUSCULAR | Status: DC
  Administered 2018-01-01: 60 mg via INTRAVENOUS
  Filled 2017-12-31: qty 6

## 2017-12-31 NOTE — ED Notes (Signed)
purewick in place.

## 2017-12-31 NOTE — Telephone Encounter (Signed)
NEW MESSAGE     1) How much weight have you gained and in what time span? ?  2) If swelling, where is the swelling located? LEGS  3) Are you currently taking a fluid pill? YES  4) Are you currently SOB? YES  5) Do you have a log of your daily weights (if so, list)? 277 today, 276, 274  6) Have you gained 3 pounds in a day or 5 pounds in a week? -  7) Have you traveled recently? NO

## 2017-12-31 NOTE — ED Provider Notes (Signed)
[facility] EMERGENCY DEPARTMENT Provider Note   CSN: [id number] Arrival date & time: 12/31/17  1406     History   Chief Complaint Chief Complaint  Patient presents with  . Leg Swelling    HPI Christine Hicks is a 62 y.o. female patient with past medical history of CHF, aortic stenosis, CKD, hypothyroid, hypertension, presenting to the ED with worsening lower extremity swelling and shortness of breath.  Patient states her primary care recently instructed her to increase her torsemide dose from 40 to 60 mg 3 times daily with hopes of improving lower extremity swelling and dyspnea on exertion.  She states she made this dose increase last week which initially provided some improvement though she states she continued to gain weight.  She states she has significant dyspnea on any exertion as well as with laying flat.  Lower extremity swelling is not improved with compression stockings or elevation.  She denies chest pain though states her chest feels tight.  Denies fever, chills, upper respiratory symptoms. Her cardiologist is Dr. Katrinka Blazing.  The history is provided by the patient.    Past Medical History:  Diagnosis Date  . Acute on chronic diastolic congestive heart failure (HCC)   . Anemia, iron deficiency    negative egd/colonoscopy 11/16/2015  . Aortic stenosis, severe   . Arthritis   . Bicuspid aortic valve   . DVT (deep venous thrombosis) (HCC)    RLE DVT 11/12/15  . GERD (gastroesophageal reflux disease)   . Heart murmur   . Hypertension   . Insomnia   . Morbid obesity with BMI of 45.0-49.9, adult (HCC)   . PONV (postoperative nausea and vomiting)    took a long time to wake up  . 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 Thyroid disease   . Wears glasses     . Wears partial dentures     Patient Active Problem List   Diagnosis Date Noted  . GERD (gastroesophageal reflux disease) 12/31/2017  . Acute renal failure superimposed on stage 2 chronic kidney disease (HCC) 12/31/2017  . Hypokalemia   . Anasarca 11/09/2017  . 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 Morbid obesity (HCC)   . Acute on chronic diastolic heart failure (HCC) 12/11/2016  . Iron deficiency anemia   . Dyspnea 11/11/2015  . Hypothyroidism 09/24/2009  . Essential hypertension 09/24/2009  . DVT, recurrent, lower extremity, chronic, bilateral (HCC) 09/24/2009    Past Surgical History:  Procedure Laterality Date  . 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 AORTIC VALVE REPLACEMENT N/A 01/01/2017   Procedure: MINIMALLY INVASIVE AORTIC VALVE REPLACEMENT (AVR);  Surgeon: Purcell Nails, MD;  Location: [facility] OR;  Service: Open Heart Surgery;  Laterality: N/A;  . ASCENDING AORTIC ROOT REPLACEMENT N/A 01/01/2017   Procedure: ASCENDING AORTIC ROOT REPLACEMENT;  Surgeon: Purcell Nails, MD;  Location: [facility] OR;  Service: Open Heart Surgery;  Laterality: N/A;  . CARDIAC CATHETERIZATION     12/16/16  . COLONOSCOPY N/A 11/16/2015   Procedure: COLONOSCOPY;  Surgeon: Iva Boop, MD;  Location: [facility] ENDOSCOPY;  Service: Endoscopy;  Laterality: N/A;  . ESOPHAGOGASTRODUODENOSCOPY N/A 11/16/2015   Procedure: ESOPHAGOGASTRODUODENOSCOPY (EGD);  Surgeon: Iva Boop, MD;  Location: [facility] ENDOSCOPY;  Service: Endoscopy;  Laterality: N/A;  . HERNIA REPAIR    . MULTIPLE TOOTH EXTRACTIONS    . Right leg cellulitis surgery    . TEE WITHOUT CARDIOVERSION N/A 01/01/2017   Procedure: TRANSESOPHAGEAL ECHOCARDIOGRAM (TEE);  Surgeon: Purcell Nails, MD;  Location: [facility] OR;  Service: Open Heart Surgery;  Laterality: N/A;     OB History   None      Home Medications    Prior to Admission medications    Medication Sig Start Date End Date Taking? Authorizing Provider  acetaminophen (TYLENOL) 500 MG tablet Take 1,000 mg by mouth every 6 (six) hours as needed (arthritis).   Yes [provider]  aspirin EC 81 MG tablet Take 81 mg by mouth daily.   Yes [provider]  ELIQUIS 5 MG TABS tablet TAKE 1 TABLET BY MOUTH TWICE A DAY Patient taking differently: TAKE 1 TABLET (5mg ) BY MOUTH TWICE A DAY 07/27/17  Yes Lyn Records, MD  ferrous sulfate 325 (65 FE) MG tablet Take 1 tablet (325 mg total) by mouth 2 (two) times daily with a meal. 11/18/15  Yes Ghimire, Werner Lean, MD  levothyroxine (SYNTHROID, LEVOTHROID) 175 MCG tablet Take 175 mcg by mouth daily. 12/19/17  Yes [provider]  Magnesium 400 MG CAPS Take 400 mg by mouth daily. 11/20/17  Yes Rhetta Mura, MD  metoprolol tartrate (LOPRESSOR) 25 MG tablet TAKE 1 TABLET BY MOUTH TWICE A DAY 11/30/17  Yes Leone Brand, NP  pantoprazole (PROTONIX) 40 MG tablet Take 1 tablet (40 mg total) by mouth daily. 12/14/17  Yes Lyn Records, MD  potassium chloride SA (KLOR-CON M20) 20 MEQ tablet Take 2 tablets (40 mEq total) by mouth 2 (two) times daily. Patient taking differently: Take 60 mEq by mouth 2 (two) times daily.  11/20/17  Yes Rhetta Mura, MD  torsemide (DEMADEX) 20 MG tablet Take 3 tablets (60 mg total) by mouth 2 (two) times daily. 12/28/17  Yes Rosalio Macadamia, NP  zolpidem (AMBIEN) 5 MG tablet Take 1 tablet (5 mg total) by mouth at bedtime as needed for sleep. 11/20/17  Yes Rhetta Mura, MD    Family History Family History  Problem Relation Age of Onset  . Cardiomyopathy Son   . Heart attack Mother   . Emphysema Father   . Lung cancer Maternal Aunt     Social History Social History   Tobacco Use  . Smoking status: Former Smoker    Types: Cigarettes  . Smokeless tobacco: Never Used  . Tobacco comment: quit smoking cigarettes > 25 years ago  Substance Use Topics  . Alcohol use: No  .  Drug use: No     Allergies   No known allergies   Review of Systems Review of Systems  Constitutional: Negative for fever.  Respiratory: Positive for shortness of breath.   Cardiovascular: Positive for leg swelling. Negative for chest pain and palpitations.  All other systems reviewed and are negative.    Physical Exam Updated Vital Signs BP 113/80   Pulse 92   Temp 97.7 F (36.5 C) (Oral)   Resp (!) 28   LMP  (LMP Unknown)   SpO2 (!) 87%   Physical Exam  Constitutional: She appears well-developed and well-nourished. No distress.  Morbidly obese  HENT:  Head: Normocephalic and atraumatic.  Eyes: Conjunctivae are normal.  Neck: Normal range of motion. Neck supple.  Cardiovascular: Normal rate, regular rhythm and intact distal pulses.  Pulmonary/Chest: Effort  normal. No respiratory distress.  Lung sounds diminished bilaterally  Abdominal: Soft. Bowel sounds are normal. There is no tenderness.  Musculoskeletal:  Significant bilateral lower extremity pitting edema.  Neurological: She is alert.  Skin: Skin is warm.  Psychiatric: She has a normal mood and affect. Her behavior is normal.  Nursing note and vitals reviewed.    ED Treatments / Results  Labs (all labs ordered are listed, but only abnormal results are displayed) Labs Reviewed  BASIC METABOLIC PANEL - Abnormal; Notable for the following components:      Result Value   Glucose, Bld 108 (*)    BUN 27 (*)    Creatinine, Ser 1.45 (*)    Calcium 8.8 (*)    GFR calc non Af Amer 37 (*)    GFR calc Af Amer 43 (*)    All other components within normal limits  CBC - Abnormal; Notable for the following components:   RBC 3.22 (*)    Hemoglobin 10.5 (*)    HCT 34.5 (*)    MCV 107.1 (*)    RDW 16.7 (*)    All other components within normal limits  BRAIN NATRIURETIC PEPTIDE - Abnormal; Notable for the following components:   B Natriuretic Peptide 408.6 (*)    All other components within normal limits  I-STAT  TROPONIN, ED    EKG EKG Interpretation  Date/Time:  Thursday December 31 2017 15:01:46 EDT Ventricular Rate:  90 PR Interval:  226 QRS Duration: 126 QT Interval:  424 QTC Calculation: 518 R Axis:   30 Text Interpretation:  Sinus rhythm with 1st degree A-V block Right bundle branch block Abnormal ECG old RBBB compared to previous. Rythm possible flutter. Confirmed by Arby Barrette [phone]) on 12/31/2017 9:31:47 PM   Radiology Dg Chest 2 View  Result Date: 12/31/2017 CLINICAL DATA:  Cough and shortness of breath for 1 week. EXAM: CHEST - 2 VIEW COMPARISON:  11/09/2017 FINDINGS: Cardiomegaly and pulmonary vascular congestion noted with trace bilateral pleural effusions. Median sternotomy again identified. There is no evidence of focal airspace disease, pulmonary edema, suspicious pulmonary nodule/mass or pneumothorax. No acute bony abnormalities are identified. IMPRESSION: Cardiomegaly, pulmonary vascular congestion and trace bilateral pleural effusions. Electronically Signed   By: Harmon Pier M.D.   On: 12/31/2017 16:06   Ct Head Wo Contrast  Result Date: 12/31/2017 CLINICAL DATA:  62 year old female with fall and head trauma. Patient on anticoagulation. EXAM: CT HEAD WITHOUT CONTRAST TECHNIQUE: Contiguous axial images were obtained from the base of the skull through the vertex without intravenous contrast. COMPARISON:  None. FINDINGS: Brain: No evidence of acute infarction, hemorrhage, hydrocephalus, extra-axial collection or mass lesion/mass effect. Vascular: No hyperdense vessel or unexpected calcification. Skull: Normal. Negative for fracture or focal lesion. Sinuses/Orbits: No acute finding. Other: None. IMPRESSION: Unremarkable noncontrast head CT Electronically Signed   By: Harmon Pier M.D.   On: 12/31/2017 17:07    Procedures Procedures (including critical care time)  Medications Ordered in ED Medications  furosemide (LASIX) injection 60 mg (60 mg Intravenous Given 12/31/17 2002)      Initial Impression / Assessment and Plan / ED Course  I have reviewed the triage vital signs and the nursing notes.  Pertinent labs & imaging results that were available during my care of the patient were reviewed by me and considered in my medical decision making (see chart for details).  Clinical Course as of Jan 01 2116  Thu Dec 31, 2017  2033 Dr. Clyde Lundborg accepting admission.   [  JR]    Clinical Course User Index [JR] Ellason Segar, [place] N, PA-C    Patient presenting to the ED with symptoms consistent with CHF exacerbation.  Dyspnea on exertion, significant lower extremity edema, not improved with increased dose of torsemide at home.  On exam, lung sounds diminished bilaterally, O2 saturation low 90s%.  Chest x-ray with pulmonary vascular congestion and trace pleural effusions.  Mild elevation in creatinine 1.45.  BNP elevated 408.6.  Patient treated in the ED with IV Lasix.  Dr. Clyde LundborgNiu with [facility] hospital[facility] accepting admission for IV diuresis.  Discussed results, findings, treatment and follow up. Patient advised of return precautions. Patient verbalized understanding and agreed with plan.  Final Clinical Impressions(s) / ED Diagnoses   Final diagnoses:  Acute on chronic systolic congestive heart failure [facility](HCC)    ED Discharge Orders    None       Jamirah Zelaya, [place], PA-C 12/31/17 2157    Arby BarrettePfeiffer, Marcy, MD 01/01/18 1402

## 2017-12-31 NOTE — Telephone Encounter (Signed)
Spoke with the patients grandson. He reports that the patients weight is up 10lbs over the past 6-7 days. Her legs are so swollen and painful that she cannot walk and go to the bathroom on her own. He says that she fell twice when trying to go to the bathroom with his assistance. She is very short of breath and does not fell well at all. He wants to take her to the ER and I advised him that he would be doing what is best for the patient. I advised him that he could call EMS for assistance or to have the ER staff bring out a wheel chair to help her get out of the car. He reports he will check and see if she wants EMS and I advised him to let us know if there is anything he needs to please call us and he was thankful for the information.

## 2017-12-31 NOTE — ED Triage Notes (Signed)
Patient complains of lower extremity edema and decreased mobility after two falls in the passed few days. Patient alert and oriented and in no apparent distress at this time.

## 2017-12-31 NOTE — H&P (Signed)
History and Physical    Christine Hicks:[id number] DOB: 1956-05-30 DOA: 12/31/2017  Referring MD/NP/PA:   PCP: Annita Brod, MD   Patient coming from:  The patient is coming from home.  At baseline, pt is independent for most of ADL.  Chief Complaint: Shortness of breath, leg edema  HPI: Christine Hicks is a 62 y.o. female with medical history significant of morbid obesity, hypertension, dCHF, DVT on Eliquis, GERD, CKD-2, aortic stenosis, s/p of aortic root replacement and valve replacement, iron deficiency anemia, who presents with shortness breath and leg edema.  Patient states that she has shortness of breath in the past several days, which has been progressively getting worse.  She does not have chest pain. She has dry cough.  She also noted worsening bilateral leg edema.  Her primary care doctor increased her torsemide dose to 60 mg twice a day without significant improvement.  Patient states that she has gained weight by approximately 20 pounds since April.  She has generalized weakness.  She states that she fell twice accidentally without significant injury.  She has bruise on the right arm.  No unilateral weakness, numbness or tingling his extremities.  No facial droop or slurred speech.  Denies nausea, vomiting, diarrhea, abdominal pain, symptoms of UTI.  ED Course: pt was found to have BNP 408, WBC 6.1, worsening renal function, oxygen saturation 89% on room air, temperature normal, heart rate in 90s, tachypnea, chest x-ray showed pulmonary vascular congestion, negative CT head for acute intracranial abnormalities.  Patient is admitted to telemetry bed as inpatient.  Review of Systems:   General: no fevers, chills, has body weight gain, has fatigue HEENT: no blurry vision, hearing changes or sore throat Respiratory: has dyspnea, coughing, no wheezing CV: no chest pain, no palpitations GI: no nausea, vomiting, abdominal pain, diarrhea, constipation GU: no dysuria, burning on  urination, increased urinary frequency, hematuria  Ext: has leg edema Neuro: no unilateral weakness, numbness, or tingling, no vision change or hearing loss. Had fall. Skin: no rash, no skin tear. MSK: No muscle spasm, no deformity, no limitation of range of movement in spin Heme: No easy bruising.  Travel history: No recent long distant travel.  Allergy:  Allergies  Allergen Reactions  . No Known Allergies     Past Medical History:  Diagnosis Date  . Acute on chronic diastolic congestive heart failure (HCC)   . Anemia, iron deficiency    negative egd/colonoscopy 11/16/2015  . Aortic stenosis, severe   . Arthritis   . Bicuspid aortic valve   . DVT (deep venous thrombosis) (HCC)    RLE DVT 11/12/15  . GERD (gastroesophageal reflux disease)   . Heart murmur   . Hypertension   . Insomnia   . Morbid obesity with BMI of 45.0-49.9, adult (HCC)   . PONV (postoperative nausea and vomiting)    took a long time to wake up  . 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 Thyroid disease   . Wears glasses   . Wears partial dentures     Past Surgical History:  Procedure Laterality Date  . 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 AORTIC VALVE REPLACEMENT N/A 01/01/2017   Procedure: MINIMALLY INVASIVE AORTIC VALVE REPLACEMENT (AVR);  Surgeon: Purcell Nails, MD;  Location: [facility] OR;  Service: Open Heart Surgery;  Laterality: N/A;  . ASCENDING AORTIC ROOT REPLACEMENT N/A 01/01/2017   Procedure: ASCENDING AORTIC ROOT REPLACEMENT;  Surgeon: Purcell Nails, MD;  Location: [facility] OR;  Service: Open Heart Surgery;  Laterality: N/A;  . CARDIAC CATHETERIZATION     12/16/16  . COLONOSCOPY N/A 11/16/2015   Procedure: COLONOSCOPY;  Surgeon: Iva Boop, MD;  Location: [facility]  ENDOSCOPY;  Service: Endoscopy;  Laterality: N/A;  . ESOPHAGOGASTRODUODENOSCOPY N/A 11/16/2015   Procedure: ESOPHAGOGASTRODUODENOSCOPY (EGD);  Surgeon: Iva Boop, MD;  Location: [facility] ENDOSCOPY;  Service: Endoscopy;  Laterality: N/A;  . HERNIA REPAIR    . MULTIPLE TOOTH EXTRACTIONS    . Right leg cellulitis surgery    . TEE WITHOUT CARDIOVERSION N/A 01/01/2017   Procedure: TRANSESOPHAGEAL ECHOCARDIOGRAM (TEE);  Surgeon: Purcell Nails, MD;  Location: [facility] OR;  Service: Open Heart Surgery;  Laterality: N/A;    Social History:  reports that she has quit smoking. Her smoking use included cigarettes. She has never used smokeless tobacco. She reports that she does not drink alcohol or use drugs.  Family History:  Family History  Problem Relation Age of Onset  . Cardiomyopathy Son   . Heart attack Mother   . Emphysema Father   . Lung cancer Maternal Aunt      Prior to Admission medications   Medication Sig Start Date End Date Taking? Authorizing Provider  acetaminophen (TYLENOL) 500 MG tablet Take 1,000 mg by mouth every 6 (six) hours as needed (arthritis).   Yes [provider]  aspirin EC 81 MG tablet Take 81 mg by mouth daily.   Yes [provider]  ELIQUIS 5 MG TABS tablet TAKE 1 TABLET BY MOUTH TWICE A DAY Patient taking differently: TAKE 1 TABLET (5mg ) BY MOUTH TWICE A DAY 07/27/17  Yes Lyn Records, MD  ferrous sulfate 325 (65 FE) MG tablet Take 1 tablet (325 mg total) by mouth 2 (two) times daily with a meal. 11/18/15  Yes Ghimire, Werner Lean, MD  levothyroxine (SYNTHROID, LEVOTHROID) 175 MCG tablet Take 175 mcg by mouth daily. 12/19/17  Yes [provider]  Magnesium 400 MG CAPS Take 400 mg by mouth daily. 11/20/17  Yes Rhetta Mura, MD  metoprolol tartrate (LOPRESSOR) 25 MG tablet TAKE 1 TABLET BY MOUTH TWICE A DAY 11/30/17  Yes Leone Brand, NP  pantoprazole (PROTONIX) 40 MG tablet Take 1 tablet (40 mg total) by mouth daily. 12/14/17  Yes Lyn Records, MD  potassium chloride SA (KLOR-CON M20) 20 MEQ tablet Take 2 tablets (40 mEq total) by mouth 2 (two) times daily. Patient taking differently: Take 60 mEq by mouth 2 (two) times daily.  11/20/17  Yes Rhetta Mura, MD  torsemide (DEMADEX) 20 MG tablet Take 3 tablets (60 mg total) by mouth 2 (two) times daily. 12/28/17  Yes Rosalio Macadamia, NP  zolpidem (AMBIEN) 5 MG tablet Take 1 tablet (5 mg total) by mouth at bedtime as needed for sleep. 11/20/17  Yes Rhetta Mura, MD    Physical Exam: Vitals:   12/31/17 2115 12/31/17 2200 12/31/17 2230 12/31/17 2306  BP: 115/68 112/63 109/65 129/72  Pulse: 95 96 96 92  Resp: 16 16 (!) 22 16  Temp:    (!) 97.4 F (36.3 C)  TempSrc:    Oral  SpO2: 97% 95% 95% 100%  Weight:    128.2 kg (282 lb 10.1 oz)  Height:    5\' 5"  (1.651 m)  General: Not in acute distress HEENT:       Eyes: PERRL, EOMI, no scleral icterus.       ENT: No discharge from the ears and nose, no pharynx injection, no tonsillar enlargement.        Neck: Difficult to assess JVD due to morbid obesity, no bruit, no mass felt. Heme: No neck lymph node enlargement. Cardiac: S1/S2, RRR, No gallops or rubs. Respiratory: No rales, wheezing, rhonchi or rubs. GI: Soft, nondistended, nontender, no rebound pain, no organomegaly, BS present. GU: No hematuria Ext: 2+ pitting leg edema bilaterally. 2+DP/PT pulse bilaterally. Musculoskeletal: No joint deformities, No joint redness or warmth, no limitation of ROM in spin. Skin: No rashes. Has bruises in right arm. Neuro: Alert, oriented X3, cranial nerves II-XII grossly intact, moves all extremities normally Psych: Patient is not psychotic, no suicidal or hemocidal ideation.  Labs on Admission: I have personally reviewed following labs and imaging studies  CBC: Recent Labs  Lab 12/31/17 1524  WBC 6.1  HGB 10.5*  HCT 34.5*  MCV 107.1*  PLT 239   Basic Metabolic Panel: Recent Labs  Lab 12/31/17 1524  NA 141  K  3.8  CL 107  CO2 25  GLUCOSE 108*  BUN 27*  CREATININE 1.45*  CALCIUM 8.8*   GFR: Estimated Creatinine Clearance: 53.6 mL/min (A) (by C-G formula based on SCr of 1.45 mg/dL (H)). Liver Function Tests: No results for input(s): AST, ALT, ALKPHOS, BILITOT, PROT, ALBUMIN in the last 168 hours. No results for input(s): LIPASE, AMYLASE in the last 168 hours. No results for input(s): AMMONIA in the last 168 hours. Coagulation Profile: No results for input(s): INR, PROTIME in the last 168 hours. Cardiac Enzymes: No results for input(s): CKTOTAL, CKMB, CKMBINDEX, TROPONINI in the last 168 hours. BNP (last 3 results) No results for input(s): PROBNP in the last 8760 hours. HbA1C: No results for input(s): HGBA1C in the last 72 hours. CBG: No results for input(s): GLUCAP in the last 168 hours. Lipid Profile: No results for input(s): CHOL, HDL, LDLCALC, TRIG, CHOLHDL, LDLDIRECT in the last 72 hours. Thyroid Function Tests: No results for input(s): TSH, T4TOTAL, FREET4, T3FREE, THYROIDAB in the last 72 hours. Anemia Panel: No results for input(s): VITAMINB12, FOLATE, FERRITIN, TIBC, IRON, RETICCTPCT in the last 72 hours. Urine analysis:    Component Value Date/Time   COLORURINE YELLOW 01/06/2017 2049   APPEARANCEUR CLEAR 01/06/2017 2049   LABSPEC 1.006 01/06/2017 2049   PHURINE 7.0 01/06/2017 2049   GLUCOSEU NEGATIVE 01/06/2017 2049   HGBUR NEGATIVE 01/06/2017 2049   BILIRUBINUR NEGATIVE 01/06/2017 2049   KETONESUR NEGATIVE 01/06/2017 2049   PROTEINUR NEGATIVE 01/06/2017 2049   UROBILINOGEN 0.2 06/22/2010 2055   NITRITE NEGATIVE 01/06/2017 2049   LEUKOCYTESUR NEGATIVE 01/06/2017 2049   Sepsis Labs: @LABRCNTIP (procalcitonin:4,lacticidven:4) )No results found for this or any previous visit (from the past 240 hour(s)).   Radiological Exams on Admission: Dg Chest 2 View  Result Date: 12/31/2017 CLINICAL DATA:  Cough and shortness of breath for 1 week. EXAM: CHEST - 2 VIEW COMPARISON:   11/09/2017 FINDINGS: Cardiomegaly and pulmonary vascular congestion noted with trace bilateral pleural effusions. Median sternotomy again identified. There is no evidence of focal airspace disease, pulmonary edema, suspicious pulmonary nodule/mass or pneumothorax. No acute bony abnormalities are identified. IMPRESSION: Cardiomegaly, pulmonary vascular congestion and trace bilateral pleural effusions. Electronically Signed   By: Harmon Pier M.D.   On: 12/31/2017 16:06   Ct Head Wo Contrast  Result Date: 12/31/2017 CLINICAL DATA:  62 year old female with fall and head trauma. Patient on anticoagulation. EXAM: CT HEAD WITHOUT CONTRAST TECHNIQUE: Contiguous axial images were obtained from the base of the skull through the vertex without intravenous contrast. COMPARISON:  None. FINDINGS: Brain: No evidence of acute infarction, hemorrhage, hydrocephalus, extra-axial collection or mass lesion/mass effect. Vascular: No hyperdense vessel or unexpected calcification. Skull: Normal. Negative for fracture or focal lesion. Sinuses/Orbits: No acute finding. Other: None. IMPRESSION: Unremarkable noncontrast head CT Electronically Signed   By: Harmon PierJeffrey  Hu M.D.   On: 12/31/2017 17:07     EKG: Independently reviewed.  QT 518, low voltage, early R wave progression, bifascicular block  Assessment/Plan Principal Problem:   Acute on chronic diastolic heart failure (HCC) Active Problems:   Essential hypertension   DVT, recurrent, lower extremity, chronic, bilateral (HCC)   Iron deficiency anemia   GERD (gastroesophageal reflux disease)   Acute renal failure superimposed on stage 2 chronic kidney disease (HCC)   Acute on chronic diastolic heart failure [facility](HCC): Patient has elevated BNP 408, lateral leg edema, pulmonary vascular congestion on chest x-ray, consistent with CHF exacerbation. -will admit to tele bed as inpt. -Lasix 60 mg bid by IV -trop x 3 -2d echo -will continue home metoprolol, ASA -Daily  weights -strict I/O's -Low salt diet  HTN:  -Continue home medications: Metoprolol -IV hydralazine prn  DVT, recurrent, lower extremity, chronic, bilateral (HCC): -on eliquis  Iron deficiency anemia: -Continue iron supplement  GERD: -Protonix  Acute renal failure superimposed on stage 2 chronic kidney disease (HCC): Baseline creatinine 1.15 on 12/01/2017, her creatinine is 1.45, BUN 27.  Likely due to cardiorenal syndrome - monitoring renal function closely by BMP while pt is on IV diuretics   DVT ppx: on eliquis Code Status: Full code Family Communication: None at bed side. Disposition Plan:  Anticipate discharge back to previous home environment Consults called:  none Admission status:   Inpatient/tele      Date of Service 01/01/2018    Lorretta HarpXilin Daianna Vasques [facility] Pager [phone]  If 7PM-7AM, please contact night-coverage [email or web address] Password [email or web address] 01/01/2018, 1:39 AM

## 2017-12-31 NOTE — Telephone Encounter (Signed)
Agree with sending to the ER.

## 2017-12-31 NOTE — ED Provider Notes (Signed)
Patient placed in Quick Look pathway, seen and evaluated   Chief Complaint: leg swelling, SOB  HPI:   Pt with hx of heart failure currently on 60 mg lasix BID presents for worsening lower extremity edema and and exertional dyspnea over the past few days. Swelling worse on right than left put pt reports this is typical for her. Pt denies any associated chest pain with exertional dyspnea.  Home health team came out to evaluate her and recommended she come to ED given persistent fluid retention. Pt also reports decreased mobility 2/2 to swelling and that she has fallen twice in the past 2 days and sone reports she hit her head in the shower, no LOC, but pt is on eliquis.   ROS: + leg swelling, SOB, weakness - fevers, CP, N/V, abd pain  Physical Exam:   Gen: No distress  Neuro: Awake and Alert  Skin: Warm  Blood pressure (!) 119/98, pulse 91, temperature 97.8 F (36.6 C), temperature source Oral, resp. rate 16, SpO2 100 %.    Focused Exam: Bilateral lower extremity edema, R > L, DP pulses intact, lungs slightly diminished at bilateral bases, no wheezes or rhonchi, no hypoxia or evidence of respiratory distress  Initiation of care has begun. The patient has been counseled on the process, plan, and necessity for staying for the completion/evaluation, and the remainder of the medical screening examination    Legrand RamsFord, Selwyn Reason N, PA-C 12/31/17 1629    Eber HongMiller, Brian, MD 01/01/18 [phone]

## 2018-01-01 ENCOUNTER — Encounter (HOSPITAL_COMMUNITY): Payer: Self-pay | Admitting: *Deleted

## 2018-01-01 ENCOUNTER — Inpatient Hospital Stay (HOSPITAL_COMMUNITY): Payer: Medicare Other

## 2018-01-01 DIAGNOSIS — I509 Heart failure, unspecified: Secondary | ICD-10-CM

## 2018-01-01 LAB — MAGNESIUM: MAGNESIUM: 2.4 mg/dL (ref 1.7–2.4)

## 2018-01-01 LAB — URINALYSIS, ROUTINE W REFLEX MICROSCOPIC
BILIRUBIN URINE: NEGATIVE
Glucose, UA: NEGATIVE mg/dL
KETONES UR: NEGATIVE mg/dL
NITRITE: NEGATIVE
Protein, ur: NEGATIVE mg/dL
SPECIFIC GRAVITY, URINE: 1.009 (ref 1.005–1.030)
pH: 6 (ref 5.0–8.0)

## 2018-01-01 LAB — BASIC METABOLIC PANEL
Anion gap: 10 (ref 5–15)
BUN: 29 mg/dL — ABNORMAL HIGH (ref 6–20)
CHLORIDE: 108 mmol/L (ref 101–111)
CO2: 24 mmol/L (ref 22–32)
Calcium: 8.6 mg/dL — ABNORMAL LOW (ref 8.9–10.3)
Creatinine, Ser: 1.56 mg/dL — ABNORMAL HIGH (ref 0.44–1.00)
GFR calc Af Amer: 40 mL/min — ABNORMAL LOW (ref >60)
GFR calc non Af Amer: 34 mL/min — ABNORMAL LOW (ref >60)
GLUCOSE: 101 mg/dL — AB (ref 65–99)
Potassium: 3.2 mmol/L — ABNORMAL LOW (ref 3.5–5.1)
Sodium: 142 mmol/L (ref 135–145)

## 2018-01-01 LAB — ECHOCARDIOGRAM LIMITED
Height: 65 in
WEIGHTICAEL: 4518.55 [oz_av]

## 2018-01-01 LAB — TROPONIN I: Troponin I: <0.03 ng/mL (ref <0.03)

## 2018-01-01 LAB — HIV ANTIBODY (ROUTINE TESTING W REFLEX): HIV Screen 4th Generation wRfx: NONREACTIVE

## 2018-01-01 MED ORDER — POTASSIUM CHLORIDE CRYS ER 20 MEQ PO TBCR
40.0000 meq | EXTENDED_RELEASE_TABLET | Freq: Once | ORAL | Status: AC
  Administered 2018-01-01: 40 meq via ORAL
  Filled 2018-01-01: qty 2

## 2018-01-01 MED ORDER — FUROSEMIDE 10 MG/ML IJ SOLN
80.0000 mg | Freq: Two times a day (BID) | INTRAMUSCULAR | Status: DC
  Administered 2018-01-01 – 2018-01-03 (×4): 80 mg via INTRAVENOUS
  Filled 2018-01-01 (×4): qty 8

## 2018-01-01 MED ORDER — ALBUTEROL SULFATE (2.5 MG/3ML) 0.083% IN NEBU
2.5000 mg | INHALATION_SOLUTION | RESPIRATORY_TRACT | Status: DC | PRN
Start: 2018-01-01 — End: 2018-01-14

## 2018-01-01 MED ORDER — HEPARIN (PORCINE) IN NACL 100-0.45 UNIT/ML-% IJ SOLN
1150.0000 [IU]/h | INTRAMUSCULAR | Status: DC
  Administered 2018-01-01: 1250 [IU]/h via INTRAVENOUS
  Filled 2018-01-01 (×2): qty 250

## 2018-01-01 MED ORDER — METOPROLOL TARTRATE 12.5 MG HALF TABLET
12.5000 mg | ORAL_TABLET | Freq: Two times a day (BID) | ORAL | Status: DC
  Administered 2018-01-01 – 2018-01-10 (×17): 12.5 mg via ORAL
  Filled 2018-01-01 (×18): qty 1

## 2018-01-01 NOTE — Care Management Note (Signed)
Case Management Note  Patient Details  Name: Christine Hicks MRN: [id number] Date of Birth: 11/16/1955  Subjective/Objective:   CHF               Action/Plan: Admitted x 2 in 6 months; PCP is Dr Crecencio McAsenzo; has private insurance with Medicare; [facility]; rolling walker at home; CM will continue to follow for progression of care.  Expected Discharge Date:  01/04/18               Expected Discharge Plan:  Home w Home Health Services  Discharge planning Services  CM Consult  Choice offered to:  Patient  HH Arranged:  RN, Disease Management, PT [facility]  Status of Service:  In process, will continue to follow  Christine MosherChandler, Christine Sherrin L, RN,MHA,BSN [phone] 01/01/2018, 3:22 PM

## 2018-01-01 NOTE — Progress Notes (Signed)
ANTICOAGULATION CONSULT NOTE - Initial Consult  Pharmacy Consult:  Heparin Indication:  History of DVT  Allergies  Allergen Reactions  . No Known Allergies     Patient Measurements: Height: 5\' 5"  (165.1 cm) Weight: 282 lb 6.6 oz (128.1 kg) IBW/kg (Calculated) : 57 Heparin Dosing Weight: 88 kg  Vital Signs: Temp: 97.9 F (36.6 C) (06/07 0500) Temp Source: Oral (06/07 0500) BP: 102/71 (06/07 0835) Pulse Rate: 88 (06/07 0835)  Labs: Recent Labs    12/31/17 1524 01/01/18 0353 01/01/18 0944  HGB 10.5*  --   --   HCT 34.5*  --   --   PLT 239  --   --   CREATININE 1.45* 1.56*  --   TROPONINI  --  <0.03 <0.03    Estimated Creatinine Clearance: 49.8 mL/min (A) (by C-G formula based on SCr of 1.56 mg/dL (H)).   Medical History: Past Medical History:  Diagnosis Date  . Acute on chronic diastolic congestive heart failure (HCC)   . Anemia, iron deficiency    negative egd/colonoscopy 11/16/2015  . Aortic stenosis, severe   . Arthritis   . Bicuspid aortic valve   . DVT (deep venous thrombosis) (HCC)    RLE DVT 11/12/15  . GERD (gastroesophageal reflux disease)   . Heart murmur   . Hypertension   . Insomnia   . Morbid obesity with BMI of 45.0-49.9, adult (HCC)   . PONV (postoperative nausea and vomiting)    took a long time to wake up  . 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 Thyroid disease   . Wears glasses   . Wears partial dentures       Assessment: Christine Hicks presented with SOB and leg edema currently getting IV Lasix.  Patient was on Xarelto for history of DVT and now to transition to IV heparin given worsening renal function.    Last Eliquis dose was today around 0830.  No bleeding reported.   Goal of Therapy:  Heparin level 0.3-0.7 units/ml aPTT 66 -  102 seconds Monitor platelets by anticoagulation protocol: Yes    Plan:  At 2045, start IV heparin at 1250 units/hr, no bolus Check 6 hr heparin level and aPTT Daily heparin level, aPTT and CBC    Shalaunda Weatherholtz D. Laney Potashang, PharmD, BCPS, BCCCP Pager:  [phone] 01/01/2018, 11:20 AM

## 2018-01-01 NOTE — Progress Notes (Signed)
  Echocardiogram 2D Echocardiogram has been performed.  Delcie RochENNINGTON, Teonia Yager 01/01/2018, 2:42 PM

## 2018-01-01 NOTE — Evaluation (Signed)
Physical Therapy Evaluation Patient Details Name: Christine Hicks MRN: [id number] DOB: 12/03/1955 Today's Date: 01/01/2018   History of Present Illness  62 y.o. female c/o SOB and LE edema with CHF. PMHx: multiple falls, morbid obesity, HTN, dCHF, DVT on Eliquis, GERD, CKD-2, aortic stenosis, previous aortic root replacement and valve replacement (2018),  iron deficiency anemia  Clinical Impression  Pt pleasant but hesitant to participate with PT due to fear of incontinence from recent medications. Pt encouraged to mobilize and was motivated by goal of returning home. Pt demonstrates mobility deficits related to bed mobility, transfers from low surfaces, and fatigue with ambulation. Pt would benefit from skilled PT to address mobility deficits to increase level of independence and reduce risk of falling.    Follow Up Recommendations Home health PT;Supervision/Assistance - 24 hour(Pt has HHPT currently)    Equipment Recommendations  None recommended by PT    Recommendations for Other Services       Precautions / Restrictions Precautions Precautions: Fall Restrictions Weight Bearing Restrictions: No      Mobility  Bed Mobility Overal bed mobility: Needs Assistance Bed Mobility: Supine to Sit     Supine to sit: Min assist;HOB elevated     General bed mobility comments: Pt requires min A for trunk and use of bed rail to perform supine>sit with HOB slightly elevated  Transfers Overall transfer level: Needs assistance Equipment used: Rolling walker (2 wheeled) Transfers: Sit to/from Stand Sit to Stand: Min guard;From elevated surface         General transfer comment: Pt attempted sit>stand x 2 from low bed to simulate home setup but was unable. Pt min guard for safety from elevated bed surface. PT min guard with sit>stand from [facility]. Cues for hand placement  Ambulation/Gait Ambulation/Gait assistance: Min guard Ambulation Distance (Feet): 175 Feet Assistive device: Rolling  walker (2 wheeled);1 person hand held assist Gait Pattern/deviations: Decreased stride length;Step-through pattern;Wide base of support Gait velocity: Decreased   General Gait Details: Pt demonstrates gait with increased postural sway over stance leg. PT ambulated 170 ft with RW with no instability noted. PT trialed L [facility]HA to simulate [facility] which pt reportedly uses at times at home. PT stability decreased with HHA with 5 ft ambulation. RW use recommended  Stairs            Wheelchair Mobility    Modified Rankin (Stroke Patients Only)       Balance Overall balance assessment: Mild deficits observed, not formally tested                                           Pertinent Vitals/Pain Pain Assessment: No/denies pain    Home Living Family/patient expects to be discharged to:: Private residence Living Arrangements: Spouse/significant other;Children Available Help at Discharge: Family;Available 24 hours/day Type of Home: House Home Access: Level entry     Home Layout: One level Home Equipment: Walker - 2 wheels;Cane - single point;Bedside commode;Shower seat      Prior Function Level of Independence: Independent with assistive device(s)         Comments: Uses RW recently; enjoys going on walks with her son; Mainly limited to household distances since April; Enjoys shopping. Pt reports 2 falls last week (1 in shower and other from sliding out of recliner chair)     Hand Dominance        Extremity/Trunk Assessment  Upper Extremity Assessment Upper Extremity Assessment: Generalized weakness    Lower Extremity Assessment Lower Extremity Assessment: Generalized weakness       Communication   Communication: No difficulties  Cognition Arousal/Alertness: Awake/alert Behavior During Therapy: WFL for tasks assessed/performed Overall Cognitive Status: Within Functional Limits for tasks assessed                                 General  Comments: Pt concerned with incontinence due to meds but willing to participate with PT with encouragement      General Comments General comments (skin integrity, edema, etc.): Bilateral LE edema    Exercises     Assessment/Plan    PT Assessment Patient needs continued PT services  PT Problem List Decreased strength;Decreased activity tolerance;Decreased mobility;Decreased balance;Decreased knowledge of use of DME;Obesity       PT Treatment Interventions DME instruction;Gait training;Functional mobility training;Therapeutic activities;Therapeutic exercise;Balance training;Neuromuscular re-education;Patient/family education    PT Goals (Current goals can be found in the Care Plan section)  Acute Rehab PT Goals Patient Stated Goal: To return home PT Goal Formulation: With patient Time For Goal Achievement: 01/15/18 Potential to Achieve Goals: Good    Frequency Min 3X/week   Barriers to discharge   Safety as evident by Previous falls (2 in last week)    Co-evaluation               AM-PAC PT "6 Clicks" Daily Activity  Outcome Measure Difficulty turning over in bed (including adjusting bedclothes, sheets and blankets)?: A Little Difficulty moving from lying on back to sitting on the side of the bed? : Unable Difficulty sitting down on and standing up from a chair with arms (e.g., wheelchair, bedside commode, etc,.)?: A Lot Help needed moving to and from a bed to chair (including a wheelchair)?: A Little Help needed walking in hospital room?: A Little Help needed climbing 3-5 steps with a railing? : A Lot 6 Click Score: 14    End of Session Equipment Utilized During Treatment: Gait belt Activity Tolerance: Patient tolerated treatment well Patient left: with call bell/phone within reach;with chair alarm set Nurse Communication: Mobility status;Other (comment)([place] and pt ability to mobilize to [facility]) PT Visit Diagnosis: Unsteadiness on feet (R26.81);Other  abnormalities of gait and mobility (R26.89);History of falling (Z91.81)    Time: 1610-9604 PT Time Calculation (min) (ACUTE ONLY): 32 min   Charges:   PT Evaluation $PT Eval Moderate Complexity: 1 Mod PT Treatments $Therapeutic Activity: 8-22 mins   PT G Codes:        Gabe Kimberle Stanfill, SPT  [employer] 01/01/2018, 12:26 PM

## 2018-01-01 NOTE — Discharge Instructions (Addendum)
1)Very low-salt diet advised 2)Weigh yourself daily, call if you gain more than 3 pounds in 1 day or more than 5 pounds in 1 week as your diuretic medications may need to be adjusted 3)Limit your Fluid  intake to no more than 60 ounces (1.8 Liters) per day 4)Home Health Nurse will wrap lower extremities with Unna booths twice a week 5) take metolazone 2.5 mg if your weight is more than 255 pounds 6) repeat CBC and BMP blood test with your primary care doctor within a week 7)Please  note that there has been several medication changes to your regimen 8)Avoid ibuprofen/Advil/Aleve/Motrin/Goody Powders/Naproxen/BC powders/Meloxicam/Diclofenac/Indomethacin and other Nonsteroidal anti-inflammatory medications as these will make you more likely to bleed and can cause stomach ulcers, can also cause Kidney problems.     Information on my medicine - ELIQUIS (apixaban)  This medication education was reviewed with me or my healthcare representative as part of my discharge preparation.  The pharmacist that spoke with me during my hospital stay was:  Lennon Alstrom, [facility]  Why was Eliquis prescribed for you? Eliquis was prescribed to treat blood clots that may have been found in the veins of your legs (deep vein thrombosis) or in your lungs (pulmonary embolism) and to reduce the risk of them occurring again.  What do You need to know about Eliquis ? Since the blood clot was >6 months ago and you received adequate treatment, the dose is ONE 2.5 mg tablet taken TWICE daily.  Eliquis may be taken with or without food.   Try to take the dose about the same time in the morning and in the evening. If you have difficulty swallowing the tablet whole please discuss with your pharmacist how to take the medication safely.  Take Eliquis exactly as prescribed and DO NOT stop taking Eliquis without talking to the doctor who prescribed the medication.  Stopping may increase your risk of developing a new blood clot.   Refill your prescription before you run out.  After discharge, you should have regular check-up appointments with your healthcare provider that is prescribing your Eliquis.    What do you do if you miss a dose? If a dose of ELIQUIS is not taken at the scheduled time, take it as soon as possible on the same day and twice-daily administration should be resumed. The dose should not be doubled to make up for a missed dose.  Important Safety Information A possible side effect of Eliquis is bleeding. You should call your healthcare provider right away if you experience any of the following: ? Bleeding from an injury or your nose that does not stop. ? Unusual colored urine (red or dark brown) or unusual colored stools (red or black). ? Unusual bruising for unknown reasons. ? A serious fall or if you hit your head (even if there is no bleeding).  Some medicines may interact with Eliquis and might increase your risk of bleeding or clotting while on Eliquis. To help avoid this, consult your healthcare provider or pharmacist prior to using any new prescription or non-prescription medications, including herbals, vitamins, non-steroidal anti-inflammatory drugs (NSAIDs) and supplements.  This website has more information on Eliquis (apixaban): http://www.eliquis.com/eliquis/home  1)Very low-salt diet advised 2)Weigh yourself daily, call if you gain more than 3 pounds in 1 day or more than 5 pounds in 1 week as your diuretic medications may need to be adjusted 3)Limit your Fluid  intake to no more than 60 ounces (1.8 Liters) per day 4)Home Health Nurse  will wrap lower extremities with Unna booths twice a week 5) take metolazone 2.5 mg if your weight is more than 255 pounds 6) repeat CBC and BMP blood test with your primary care doctor within a week 7)Please  note that there has been several medication changes to your regimen 8)Avoid ibuprofen/Advil/Aleve/Motrin/Goody Powders/Naproxen/BC  powders/Meloxicam/Diclofenac/Indomethacin and other Nonsteroidal anti-inflammatory medications as these will make you more likely to bleed and can cause stomach ulcers, can also cause Kidney problems.

## 2018-01-01 NOTE — Evaluation (Signed)
Occupational Therapy Evaluation Patient Details Name: Christine Hicks MRN: [id number] DOB: 1956/05/13 Today's Date: 01/01/2018    History of Present Illness 62 y.o. female c/o SOB and LE edema with CHF. PMHx: multiple falls, morbid obesity, HTN, dCHF, DVT on Eliquis, GERD, CKD-2, aortic stenosis, previous aortic root replacement and valve replacement (2018),  iron deficiency anemia   Clinical Impression   Pt admitted with above. She demonstrates the below listed deficits and will benefit from continued OT to maximize safety and independence with BADLs.  She currently requires min guard assist to min A for ADLs.  She lives with her spouse who is very supportive.  Anticipate she will progress well with therapies.        Follow Up Recommendations  No OT follow up;Supervision - Intermittent    Equipment Recommendations  None recommended by OT    Recommendations for Other Services       Precautions / Restrictions Precautions Precautions: Fall Restrictions Weight Bearing Restrictions: No      Mobility Bed Mobility Overal bed mobility: Needs Assistance Bed Mobility: Supine to Sit     Supine to sit: Min assist;HOB elevated     General bed mobility comments: Pt up in chair   Transfers Overall transfer level: Needs assistance Equipment used: Rolling walker (2 wheeled) Transfers: Sit to/from [employer] Sit to Stand: Min guard Stand pivot transfers: Min guard       General transfer comment: Pt attempted sit>stand x 2 from low bed to simulate home setup but was unable. Pt min guard for safety from elevated bed surface. PT min guard with sit>stand from [facility]. Cues for hand placement    Balance Overall balance assessment: Mild deficits observed, not formally tested                                         ADL either performed or assessed with clinical judgement   ADL Overall ADL's : Needs assistance/impaired Eating/Feeding: Independent    Grooming: Wash/dry hands;Wash/dry face;Oral care;Brushing hair;Min guard;Standing   Upper Body Bathing: Min guard;Standing   Lower Body Bathing: Min guard;Sit to/from stand   Upper Body Dressing : Set up;Sitting   Lower Body Dressing: Minimal assistance;Sit to/from stand   Toilet Transfer: Min guard;Ambulation;Comfort height toilet;Grab bars;RW   Toileting- Architect and Hygiene: Min guard;Sit to/from stand       Functional mobility during ADLs: Min guard;Rolling walker General ADL Comments: Pt able to simulate shower in standing with min guard assist.  Discussed use of tub transfer bench for home use.  Pt reports her spouse has one, and it is in the closet.       Vision         Perception     Praxis      Pertinent Vitals/Pain Pain Assessment: No/denies pain     Hand Dominance     Extremity/Trunk Assessment Upper Extremity Assessment Upper Extremity Assessment: Generalized weakness   Lower Extremity Assessment Lower Extremity Assessment: Defer to PT evaluation       Communication Communication Communication: No difficulties   Cognition Arousal/Alertness: Awake/alert Behavior During Therapy: WFL for tasks assessed/performed Overall Cognitive Status: Within Functional Limits for tasks assessed                                 General Comments: Pt concerned with  incontinence due to meds but willing to participate with PT with encouragement   General Comments  Bilateral LE edema    Exercises     Shoulder Instructions      Home Living Family/patient expects to be discharged to:: Private residence Living Arrangements: Spouse/significant other;Children Available Help at Discharge: Family;Available 24 hours/day Type of Home: House Home Access: Level entry     Home Layout: One level     Bathroom Shower/Tub: Chief Strategy OfficerTub/shower unit   Bathroom Toilet: Standard     Home Equipment: Environmental consultantWalker - 2 wheels;Cane - single point;Bedside  commode;Shower seat          Prior Functioning/Environment Level of Independence: Needs assistance  Gait / Transfers Assistance Needed: Uses RW recently; enjoys going on walks with her son; Mainly limited to household distances since April; Enjoys shopping. Pt reports 2 falls last week (1 in shower and other from sliding out of recliner chair) ADL's / Homemaking Assistance Needed: Spouse assists with compression stockings and shower transfers.  Pt reports recent fall while stepping over the tub    Comments: Uses RW recently; enjoys going on walks with her son; Mainly limited to household distances since April; Enjoys shopping. Pt reports 2 falls last week (1 in shower and other from sliding out of recliner chair)        OT Problem List: Decreased strength;Decreased activity tolerance;Cardiopulmonary status limiting activity;Obesity      OT Treatment/Interventions: Self-care/ADL training;Energy conservation;DME and/or AE instruction;Therapeutic activities;Patient/family education;Balance training    OT Goals(Current goals can be found in the care plan section) Acute Rehab OT Goals Patient Stated Goal: to get strength back and get back to normal  OT Goal Formulation: With patient Time For Goal Achievement: 01/15/18 Potential to Achieve Goals: Good ADL Goals Pt Will Perform Grooming: with modified independence;standing Pt Will Perform Upper Body Bathing: with modified independence;standing Pt Will Perform Lower Body Bathing: with modified independence;sit to/from stand Pt Will Perform Upper Body Dressing: with modified independence;standing Pt Will Perform Lower Body Dressing: with modified independence;sit to/from stand Pt Will Transfer to Toilet: with modified independence;ambulating;bedside commode;grab bars Pt Will Perform Toileting - Clothing Manipulation and hygiene: with modified independence;sit to/from stand Pt Will Perform Tub/Shower Transfer: Tub transfer;with  supervision;ambulating;tub bench;rolling walker  OT Frequency: Min 2X/week   Barriers to D/C:            Co-evaluation              AM-PAC PT "6 Clicks" Daily Activity     Outcome Measure Help from another person eating meals?: None Help from another person taking care of personal grooming?: A Little Help from another person toileting, which includes using toliet, bedpan, or urinal?: A Little Help from another person bathing (including washing, rinsing, drying)?: A Little Help from another person to put on and taking off regular upper body clothing?: A Little Help from another person to put on and taking off regular lower body clothing?: A Little 6 Click Score: 19   End of Session Equipment Utilized During Treatment: Rolling walker Nurse Communication: Mobility status  Activity Tolerance: Patient tolerated treatment well Patient left: in chair;with call bell/phone within reach  OT Visit Diagnosis: Unsteadiness on feet (R26.81)                Time: [id number] OT Time Calculation (min): 23 min Charges:  OT General Charges $OT Visit: 1 Visit OT Evaluation $OT Eval Moderate Complexity: 1 Mod OT Treatments $Self Care/Home Management : 8-22 mins G-Codes:  Christine Hicks, OTR/L [phone]   Christine Hicks 01/01/2018, 2:42 PM

## 2018-01-01 NOTE — Progress Notes (Addendum)
PROGRESS NOTE    Christine Hicks  HYQ:[id number] DOB: 06/28/1956 DOA: 12/31/2017 PCP: Annita BrodAsenso, Philip, MD   Brief Narrative:   Christine Hicks is a 62 y.o. female with medical history significant of morbid obesity, hypertension, dCHF, DVT on Eliquis, GERD, CKD-2, aortic stenosis, s/p of aortic root replacement and valve replacement, iron deficiency anemia, who presents with shortness breath and leg edema.  Patient states that she has shortness of breath in the past several days, which has been progressively getting worse.  She does not have chest pain. She has dry cough.  She also noted worsening bilateral leg edema.  Her primary care doctor increased her torsemide dose to 60 mg twice a day without significant improvement.  Patient states that she has gained weight by approximately 20 pounds since April.  She has generalized weakness.  She states that she fell twice accidentally without significant injury.  She has bruise on the right arm.  No unilateral weakness, numbness or tingling his extremities.  No facial droop or slurred speech.  Denies nausea, vomiting, diarrhea, abdominal pain, symptoms of UTI.  Assessment & Plan:   Principal Problem:   Acute on chronic diastolic heart failure (HCC) Active Problems:   Essential hypertension   DVT, recurrent, lower extremity, chronic, bilateral (HCC)   Iron deficiency anemia   GERD (gastroesophageal reflux disease)   Acute renal failure superimposed on stage 2 chronic kidney disease (HCC)   Acute on chronic diastolic heart failure [facility](HCC): Patient has elevated BNP 408, lateral leg edema, pulmonary vascular congestion on chest x-ray, consistent with CHF exacerbation. - On torsemide 40 BID as outpatient, increased to 60 BID prior to coming in for admission - She notes she feels good around 255 lbs (weight day prior to discharge last admission was ~257) - 282 lbs on hospital day 1  - increase lasix to 80 mg BID - trop x 3 - 2d echo - will continue  home metoprolol, ASA - Daily weights - strict I/O's - Low salt diet - may need to consult cards if difficulty with diuresis  Wt Readings from Last 3 Encounters:  01/01/18 128.1 kg (282 lb 6.6 oz)  12/29/17 119.3 kg (263 lb)  12/01/17 120.6 kg (265 lb 12.8 oz)   HTN:  -Continue home medications: Metoprolol (decrease to 12.5 mg BID) -IV hydralazine prn  Bradycardia: sinus brady noted on tele, will decrease metop, continue to monitor  Falls: sounds like she had 2 mechanical falls (1 where she slipped and 1 where grab bar broke).  On exam, symmetric strength and no focal deficits.  Will diurese as noted above.  PT consult.  Negative head CT.  Tremor: describes tremor in bilateral hands that started after torsemide dose increased.  Not obvious on my exam.  Will follow TSH and B12.  Continue to monitor.  Hypothyroidism: synthroid  DVT, recurrent, lower extremity, chronic, bilateral (HCC): -on eliquis, but with AKI, will transition to heparin until renal function clearly improving  Right greater than Left Lower Extremity Edema: likely due to above, follow up US.    Iron deficiency anemia: -Continue iron supplement  GERD: -Protonix  Acute renal failure superimposed on stage 2 chronic kidney disease (HCC): Baseline creatinine 1.15 on 12/01/2017, her creatinine is 1.45, BUN 27. Suspected 2/2 hypervolemia.   - Follow urinalysis - follow renal function while diuresing - further workup as indicated  Hypokalemia: replete, follow mag  Prolonged QT: replete K and follow mag.  Limit QT prolonging meds. Repeat AM ekg.  DVT  prophylaxis: heparin Code Status: full  Family Communication: none at bedside Disposition Plan: pending improvement   Consultants:   none  Procedures:   none  Antimicrobials:   none    Subjective: Presented with LE swelling, couldn't get in her shoes. Had worsening shortness of breath as well.  Objective: Vitals:   12/31/17 2230 12/31/17 2306  01/01/18 0500 01/01/18 0835  BP: 109/65 129/72 107/76 102/71  Pulse: 96 92 89 88  Resp: (!) 22 16 18    Temp:  (!) 97.4 F (36.3 C) 97.9 F (36.6 C)   TempSrc:  Oral Oral   SpO2: 95% 100% 100%   Weight:  128.2 kg (282 lb 10.1 oz) 128.1 kg (282 lb 6.6 oz)   Height:  5\' 5"  (1.651 m)      Intake/Output Summary (Last 24 hours) at 01/01/2018 0946 Last data filed at 01/01/2018 1610 Gross per 24 hour  Intake 660 ml  Output 600 ml  Net 60 ml   Filed Weights   12/31/17 2306 01/01/18 0500  Weight: 128.2 kg (282 lb 10.1 oz) 128.1 kg (282 lb 6.6 oz)    Examination:  General exam: Appears calm and comfortable  Respiratory system: crackles appreciated most on the R Cardiovascular system: S1 & S2 heard, RRR. No JVD, murmurs, rubs, gallops or clicks. No pedal edema. Gastrointestinal system: Abdomen is nondistended, soft and nontender. No organomegaly or masses felt. Normal bowel sounds heard. Central nervous system: Alert and oriented. No focal neurological deficits. Extremities: R>L LEE (she notes this is chronic).  Dependent edema.  Skin: No rashes, lesions or ulcers Psychiatry: Judgement and insight appear normal. Mood & affect appropriate.     Data Reviewed: I have personally reviewed following labs and imaging studies  CBC: Recent Labs  Lab 12/31/17 1524  WBC 6.1  HGB 10.5*  HCT 34.5*  MCV 107.1*  PLT 239   Basic Metabolic Panel: Recent Labs  Lab 12/31/17 1524 01/01/18 0353  NA 141 142  K 3.8 3.2*  CL 107 108  CO2 25 24  GLUCOSE 108* 101*  BUN 27* 29*  CREATININE 1.45* 1.56*  CALCIUM 8.8* 8.6*  MG  --  2.4   GFR: Estimated Creatinine Clearance: 49.8 mL/min (A) (by C-G formula based on SCr of 1.56 mg/dL (H)). Liver Function Tests: No results for input(s): AST, ALT, ALKPHOS, BILITOT, PROT, ALBUMIN in the last 168 hours. No results for input(s): LIPASE, AMYLASE in the last 168 hours. No results for input(s): AMMONIA in the last 168 hours. Coagulation Profile: No  results for input(s): INR, PROTIME in the last 168 hours. Cardiac Enzymes: Recent Labs  Lab 01/01/18 0353  TROPONINI <0.03   BNP (last 3 results) No results for input(s): PROBNP in the last 8760 hours. HbA1C: No results for input(s): HGBA1C in the last 72 hours. CBG: No results for input(s): GLUCAP in the last 168 hours. Lipid Profile: No results for input(s): CHOL, HDL, LDLCALC, TRIG, CHOLHDL, LDLDIRECT in the last 72 hours. Thyroid Function Tests: No results for input(s): TSH, T4TOTAL, FREET4, T3FREE, THYROIDAB in the last 72 hours. Anemia Panel: No results for input(s): VITAMINB12, FOLATE, FERRITIN, TIBC, IRON, RETICCTPCT in the last 72 hours. Sepsis Labs: No results for input(s): PROCALCITON, LATICACIDVEN in the last 168 hours.  No results found for this or any previous visit (from the past 240 hour(s)).       Radiology Studies: Dg Chest 2 View  Result Date: 12/31/2017 CLINICAL DATA:  Cough and shortness of breath for 1 week. EXAM:  CHEST - 2 VIEW COMPARISON:  11/09/2017 FINDINGS: Cardiomegaly and pulmonary vascular congestion noted with trace bilateral pleural effusions. Median sternotomy again identified. There is no evidence of focal airspace disease, pulmonary edema, suspicious pulmonary nodule/mass or pneumothorax. No acute bony abnormalities are identified. IMPRESSION: Cardiomegaly, pulmonary vascular congestion and trace bilateral pleural effusions. Electronically Signed   By: Harmon Pier M.D.   On: 12/31/2017 16:06   Ct Head Wo Contrast  Result Date: 12/31/2017 CLINICAL DATA:  62 year old female with fall and head trauma. Patient on anticoagulation. EXAM: CT HEAD WITHOUT CONTRAST TECHNIQUE: Contiguous axial images were obtained from the base of the skull through the vertex without intravenous contrast. COMPARISON:  None. FINDINGS: Brain: No evidence of acute infarction, hemorrhage, hydrocephalus, extra-axial collection or mass lesion/mass effect. Vascular: No hyperdense  vessel or unexpected calcification. Skull: Normal. Negative for fracture or focal lesion. Sinuses/Orbits: No acute finding. Other: None. IMPRESSION: Unremarkable noncontrast head CT Electronically Signed   By: Harmon Pier M.D.   On: 12/31/2017 17:07        Scheduled Meds: . apixaban  5 mg Oral BID  . aspirin EC  81 mg Oral Daily  . ferrous sulfate  325 mg Oral BID WC  . furosemide  80 mg Intravenous BID  . levothyroxine  175 mcg Oral QAC breakfast  . magnesium oxide  400 mg Oral Daily  . metoprolol tartrate  25 mg Oral BID  . pantoprazole  40 mg Oral Daily  . potassium chloride  40 mEq Oral Once  . sodium chloride flush  3 mL Intravenous Q12H   Continuous Infusions: . sodium chloride       LOS: 1 day    Time spent: over 30 min    Lacretia Nicks, MD [facility] Pager [phone]  If 7PM-7AM, please contact night-coverage [email or web address] Password [facility] 01/01/2018, 9:46 AM

## 2018-01-02 ENCOUNTER — Inpatient Hospital Stay (HOSPITAL_COMMUNITY): Payer: Medicare Other

## 2018-01-02 DIAGNOSIS — R609 Edema, unspecified: Secondary | ICD-10-CM

## 2018-01-02 LAB — CBC
HCT: 34.4 % — ABNORMAL LOW (ref 36.0–46.0)
Hemoglobin: 10.2 g/dL — ABNORMAL LOW (ref 12.0–15.0)
MCH: 31.3 pg (ref 26.0–34.0)
MCHC: 29.7 g/dL — AB (ref 30.0–36.0)
MCV: 105.5 fL — ABNORMAL HIGH (ref 78.0–100.0)
PLATELETS: 247 10*3/uL (ref 150–400)
RBC: 3.26 MIL/uL — AB (ref 3.87–5.11)
RDW: 16.6 % — ABNORMAL HIGH (ref 11.5–15.5)
WBC: 6.7 10*3/uL (ref 4.0–10.5)

## 2018-01-02 LAB — BASIC METABOLIC PANEL
ANION GAP: 9 (ref 5–15)
BUN: 29 mg/dL — AB (ref 6–20)
CO2: 26 mmol/L (ref 22–32)
Calcium: 8.7 mg/dL — ABNORMAL LOW (ref 8.9–10.3)
Chloride: 106 mmol/L (ref 101–111)
Creatinine, Ser: 1.43 mg/dL — ABNORMAL HIGH (ref 0.44–1.00)
GFR calc non Af Amer: 38 mL/min — ABNORMAL LOW (ref >60)
GFR, EST AFRICAN AMERICAN: 44 mL/min — AB (ref >60)
Glucose, Bld: 102 mg/dL — ABNORMAL HIGH (ref 65–99)
POTASSIUM: 4 mmol/L (ref 3.5–5.1)
SODIUM: 141 mmol/L (ref 135–145)

## 2018-01-02 LAB — APTT
APTT: 125 s — AB (ref 24–36)
aPTT: 108 seconds — ABNORMAL HIGH (ref 24–36)

## 2018-01-02 LAB — VITAMIN B12: Vitamin B-12: 155 pg/mL — ABNORMAL LOW (ref 180–914)

## 2018-01-02 LAB — FOLATE: Folate: 7 ng/mL (ref >5.9)

## 2018-01-02 LAB — TSH: TSH: 0.487 u[IU]/mL (ref 0.350–4.500)

## 2018-01-02 LAB — MAGNESIUM: Magnesium: 2.5 mg/dL — ABNORMAL HIGH (ref 1.7–2.4)

## 2018-01-02 LAB — HEPARIN LEVEL (UNFRACTIONATED): Heparin Unfractionated: >2.2 IU/mL — ABNORMAL HIGH (ref 0.30–0.70)

## 2018-01-02 MED ORDER — CYANOCOBALAMIN 1000 MCG/ML IJ SOLN
1000.0000 ug | Freq: Every day | INTRAMUSCULAR | Status: AC
  Administered 2018-01-03 – 2018-01-08 (×6): 1000 ug via INTRAMUSCULAR
  Filled 2018-01-02 (×7): qty 1

## 2018-01-02 MED ORDER — HEPARIN (PORCINE) IN NACL 100-0.45 UNIT/ML-% IJ SOLN: 1100.0000 [IU]/h | INTRAMUSCULAR | Status: DC

## 2018-01-02 MED ORDER — POTASSIUM CHLORIDE CRYS ER 20 MEQ PO TBCR
40.0000 meq | EXTENDED_RELEASE_TABLET | Freq: Every day | ORAL | Status: DC
  Administered 2018-01-02 – 2018-01-03 (×2): 40 meq via ORAL
  Filled 2018-01-02: qty 2

## 2018-01-02 NOTE — Progress Notes (Signed)
LE venous duplex prelim: limited visualization due to body habitus. No obvious DVT noted. Farrel DemarkJill Eunice, RDMS, RVT

## 2018-01-02 NOTE — Progress Notes (Signed)
ANTICOAGULATION CONSULT NOTE  Pharmacy Consult:  Heparin (Apixaban on hold) Indication:  History of DVT  Allergies  Allergen Reactions  . No Known Allergies     Patient Measurements: Height: 5\' 5"  (165.1 cm) Weight: 280 lb 9.6 oz (127.3 kg) IBW/kg (Calculated) : 57 Heparin Dosing Weight: 88 kg  Vital Signs: Temp: 97.9 F (36.6 C) (06/08 1155) Temp Source: Oral (06/08 1155) BP: 99/73 (06/08 1156) Pulse Rate: 74 (06/08 1156)  Labs: Recent Labs    12/31/17 1524 01/01/18 0353 01/01/18 0944 01/02/18 0434 01/02/18 1320  HGB 10.5*  --   --  10.2*  --   HCT 34.5*  --   --  34.4*  --   PLT 239  --   --  247  --   APTT  --   --   --  108* 125*  HEPARINUNFRC  --   --   --  >2.20*  --   CREATININE 1.45* 1.56*  --  1.43*  --   TROPONINI  --  <0.03 <0.03  --   --     Estimated Creatinine Clearance: 54.1 mL/min (A) (by C-G formula based on SCr of 1.43 mg/dL (H)).   Medical History: Past Medical History:  Diagnosis Date  . Acute on chronic diastolic congestive heart failure (HCC)   . Anemia, iron deficiency    negative egd/colonoscopy 11/16/2015  . Aortic stenosis, severe   . Arthritis   . Bicuspid aortic valve   . DVT (deep venous thrombosis) (HCC)    RLE DVT 11/12/15  . GERD (gastroesophageal reflux disease)   . Heart murmur   . Hypertension   . Insomnia   . Morbid obesity with BMI of 45.0-49.9, adult (HCC)   . PONV (postoperative nausea and vomiting)    took a long time to wake up  . 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 Thyroid disease   . Wears glasses   . Wears partial dentures     Assessment: Christine Hicks presented with SOB and leg edema currently getting IV Lasix.  Patient was on Apixaban for history of DVT and transitioned to IV heparin given worsening  renal function.   -aPTT = 125 after decrease to 1150 units/hr  Goal of Therapy:  Heparin level 0.3-0.7 units/ml aPTT 66 - 102 seconds Monitor platelets by anticoagulation protocol: Yes   Plan:   -Hold heparin for 30 minutes then decrease to 950 units/hr -aPTT, heparin level and CBC daily  Harland GermanAndrew Thom Ollinger, PharmD Clinical Pharmacist  01/02/2018 6:37 PM

## 2018-01-02 NOTE — Progress Notes (Signed)
PROGRESS NOTE    Christine Hicks  NWG:[id number] DOB: 03/04/1956 DOA: 12/31/2017 PCP: Annita BrodAsenso, Philip, MD   Brief Narrative:   Christine Hicks is Christine Hicks 62 y.o. female with medical history significant of morbid obesity, hypertension, dCHF, DVT on Eliquis, GERD, CKD-2, aortic stenosis, s/p of aortic root replacement and valve replacement, iron deficiency anemia, who presents with shortness breath and leg edema.  Patient states that she has shortness of breath in the past several days, which has been progressively getting worse.  She does not have chest pain. She has dry cough.  She also noted worsening bilateral leg edema.  Her primary care doctor increased her torsemide dose to 60 mg twice Christine Hicks without significant improvement.  Patient states that she has gained weight by approximately 20 pounds since April.  She has generalized weakness.  She states that she fell twice accidentally without significant injury.  She has bruise on the right arm.  No unilateral weakness, numbness or tingling his extremities.  No facial droop or slurred speech.  Denies nausea, vomiting, diarrhea, abdominal pain, symptoms of UTI.  Assessment & Plan:   Principal Problem:   Acute on chronic diastolic heart failure (HCC) Active Problems:   Essential hypertension   DVT, recurrent, lower extremity, chronic, bilateral (HCC)   Iron deficiency anemia   GERD (gastroesophageal reflux disease)   Acute renal failure superimposed on stage 2 chronic kidney disease (HCC)   Acute on chronic diastolic heart failure Christine Hicks(HCC): Patient has elevated BNP 408, lateral leg edema, pulmonary vascular congestion on chest x-ray, consistent with CHF exacerbation. - On torsemide 40 BID as outpatient, increased to 60 BID prior to coming in for admission - She notes she feels good around 255 lbs (weight Hicks prior to discharge last admission was ~257) - 282 lbs on hospital Hicks 1, down to 280 today, will continue to watch on 80 mg BID dosing (increased  yesterday afternoon) - increase lasix to 80 mg BID - trop x 3 - 2d echo (EF 65-70%, severe TV regurgitation, increased PASP - see report) - will continue home metoprolol, ASA - Daily weights - strict I/O's - Low salt diet - may need to consult cards if difficulty with diuresis  Wt Readings from Last 3 Encounters:  01/02/18 127.3 kg (280 lb 9.6 oz)  12/29/17 119.3 kg (263 lb)  12/01/17 120.6 kg (265 lb 12.8 oz)   HTN:  -Continue home medications: Metoprolol (decrease to 12.5 mg BID) -IV hydralazine prn  Bradycardia: sinus brady noted on tele, will decrease metop, continue to monitor  Falls: sounds like she had 2 mechanical falls (1 where she slipped and 1 where grab bar broke).  On exam, symmetric strength and no focal deficits.  Will diurese as noted above.  PT consult.  Negative head CT. - home health PT recommended as well as 24 hour supervision/assistance  Tremor: describes tremor in bilateral hands that started after torsemide dose increased.  Notes this for past few weeks.  TSH wnl.   - continue to monitor, follow up outpatient  Vitamin B12 deficiency: Vitamin B12 IM x 7 days.  Follow folate.   Hypothyroidism: synthroid  DVT, recurrent, lower extremity, chronic, bilateral (HCC): -on eliquis, but with AKI, will transition to heparin until renal function clearly improving  Right greater than Left Lower Extremity Edema: likely due to above, follow up US.    Iron deficiency anemia: -Continue iron supplement  GERD: -Protonix  Acute renal failure superimposed on stage 2 chronic kidney disease (HCC):  Baseline creatinine 1.15 on 12/01/2017.  Creatinine peaked to 1.56, seems to be improving with diuresis.  Follow. - Follow urinalysis (no protein or RBC's) - follow renal function while diuresing - further workup as indicated  Hypokalemia: replete, follow mag.  Stable, follow.  Prolonged QT: replete K and follow mag.  Limit QT prolonging meds. ~490 today.  Repeat AM  EKG.  DVT prophylaxis: heparin Code Status: full  Family Communication: sister at bedside.  Lots of questions about what was causing her swelling and why she's readmitted.  Discussed heart failure exacerbation with plan for diuresis and ctm renal function, etc.  Will consult cards if needed.  Disposition Plan: pending improvement   Consultants:   none  Procedures:   none  Antimicrobials:   none    Subjective: Feeling Christine Hicks.   Objective: Vitals:   01/02/18 0437 01/02/18 0442 01/02/18 1155 01/02/18 1156  BP: 98/66  (!) 89/67 99/73  Pulse: 74  64 74  Resp: 20  20   Temp: 98.1 F (36.7 C)  97.9 F (36.6 C)   TempSrc: Oral  Oral   SpO2: 100%  99% 99%  Weight:  127.3 kg (280 lb 9.6 oz)    Height:        Intake/Output Summary (Last 24 hours) at 01/02/2018 1244 Last data filed at 01/02/2018 1212 Gross per 24 hour  Intake 1519.17 ml  Output 2800 ml  Net -1280.83 ml   Filed Weights   12/31/17 2306 01/01/18 0500 01/02/18 0442  Weight: 128.2 kg (282 lb 10.1 oz) 128.1 kg (282 lb 6.6 oz) 127.3 kg (280 lb 9.6 oz)    Examination:  General: No acute distress. Cardiovascular: Heart sounds show Christine Hicks regular rate, and rhythm. No gallops or rubs. No murmurs. No JVD. Lungs: Clear to auscultation bilaterally with good air movement. No rales, rhonchi or wheezes. Abdomen: Soft, nontender, nondistended with normal active bowel sounds. No masses. No hepatosplenomegaly. Neurological: Alert and oriented 3. Moves all extremities 4. Cranial nerves II through XII grossly intact.   Skin: Warm and dry. No rashes or lesions. Extremities: R>L lower extremity edema Psychiatric: Mood and affect are normal. Insight and judgment are appropriate.    Data Reviewed: I have personally reviewed following labs and imaging studies  CBC: Recent Labs  Lab 12/31/17 1524 01/02/18 0434  WBC 6.1 6.7  HGB 10.5* 10.2*  HCT 34.5* 34.4*  MCV 107.1* 105.5*  PLT 239 247   Basic Metabolic  Panel: Recent Labs  Lab 12/31/17 1524 01/01/18 0353 01/02/18 0434  NA 141 142 141  K 3.8 3.2* 4.0  CL 107 108 106  CO2 25 24 26   GLUCOSE 108* 101* 102*  BUN 27* 29* 29*  CREATININE 1.45* 1.56* 1.43*  CALCIUM 8.8* 8.6* 8.7*  MG  --  2.4 2.5*   GFR: Estimated Creatinine Clearance: 54.1 mL/min (Shayan Bramhall) (by C-G formula based on SCr of 1.43 mg/dL (H)). Liver Function Tests: No results for input(s): AST, ALT, ALKPHOS, BILITOT, PROT, ALBUMIN in the last 168 hours. No results for input(s): LIPASE, AMYLASE in the last 168 hours. No results for input(s): AMMONIA in the last 168 hours. Coagulation Profile: No results for input(s): INR, PROTIME in the last 168 hours. Cardiac Enzymes: Recent Labs  Lab 01/01/18 0353 01/01/18 0944  TROPONINI <0.03 <0.03   BNP (last 3 results) No results for input(s): PROBNP in the last 8760 hours. HbA1C: No results for input(s): HGBA1C in the last 72 hours. CBG: No results for input(s): GLUCAP in  the last 168 hours. Lipid Profile: No results for input(s): CHOL, HDL, LDLCALC, TRIG, CHOLHDL, LDLDIRECT in the last 72 hours. Thyroid Function Tests: Recent Labs    01/02/18 0434  TSH 0.487   Anemia Panel: Recent Labs    01/02/18 0434  VITAMINB12 155*   Sepsis Labs: No results for input(s): PROCALCITON, LATICACIDVEN in the last 168 hours.  No results found for this or any previous visit (from the past 240 hour(s)).       Radiology Studies: Dg Chest 2 View  Result Date: 12/31/2017 CLINICAL DATA:  Cough and shortness of breath for 1 week. EXAM: CHEST - 2 VIEW COMPARISON:  11/09/2017 FINDINGS: Cardiomegaly and pulmonary vascular congestion noted with trace bilateral pleural effusions. Median sternotomy again identified. There is no evidence of focal airspace disease, pulmonary edema, suspicious pulmonary nodule/mass or pneumothorax. No acute bony abnormalities are identified. IMPRESSION: Cardiomegaly, pulmonary vascular congestion and trace bilateral  pleural effusions. Electronically Signed   By: Harmon Pier M.D.   On: 12/31/2017 16:06   Ct Head Wo Contrast  Result Date: 12/31/2017 CLINICAL DATA:  62 year old female with fall and head trauma. Patient on anticoagulation. EXAM: CT HEAD WITHOUT CONTRAST TECHNIQUE: Contiguous axial images were obtained from the base of the skull through the vertex without intravenous contrast. COMPARISON:  None. FINDINGS: Brain: No evidence of acute infarction, hemorrhage, hydrocephalus, extra-axial collection or mass lesion/mass effect. Vascular: No hyperdense vessel or unexpected calcification. Skull: Normal. Negative for fracture or focal lesion. Sinuses/Orbits: No acute finding. Other: None. IMPRESSION: Unremarkable noncontrast head CT Electronically Signed   By: Harmon Pier M.D.   On: 12/31/2017 17:07        Scheduled Meds: . aspirin EC  81 mg Oral Daily  . cyanocobalamin  1,000 mcg Intramuscular Daily  . ferrous sulfate  325 mg Oral BID WC  . furosemide  80 mg Intravenous BID  . levothyroxine  175 mcg Oral QAC breakfast  . magnesium oxide  400 mg Oral Daily  . metoprolol tartrate  12.5 mg Oral BID  . pantoprazole  40 mg Oral Daily  . sodium chloride flush  3 mL Intravenous Q12H   Continuous Infusions: . sodium chloride    . heparin 1,150 Units/hr (01/02/18 0629)     LOS: 2 days    Time spent: over 30 min    Lacretia Nicks, MD [facility] Pager [phone]  If 7PM-7AM, please contact night-coverage [email or web address] Password [facility] 01/02/2018, 12:44 PM

## 2018-01-02 NOTE — Progress Notes (Signed)
ANTICOAGULATION CONSULT NOTE  Pharmacy Consult:  Heparin (Apixaban on hold) Indication:  History of DVT  Allergies  Allergen Reactions  . No Known Allergies     Patient Measurements: Height: 5\' 5"  (165.1 cm) Weight: 280 lb 9.6 oz (127.3 kg) IBW/kg (Calculated) : 57 Heparin Dosing Weight: 88 kg  Vital Signs: Temp: 98.1 F (36.7 C) (06/08 0437) Temp Source: Oral (06/08 0437) BP: 98/66 (06/08 0437) Pulse Rate: 74 (06/08 0437)  Labs: Recent Labs    12/31/17 1524 01/01/18 0353 01/01/18 0944 01/02/18 0434  HGB 10.5*  --   --  10.2*  HCT 34.5*  --   --  34.4*  PLT 239  --   --  247  APTT  --   --   --  108*  HEPARINUNFRC  --   --   --  >2.20*  CREATININE 1.45* 1.56*  --  1.43*  TROPONINI  --  <0.03 <0.03  --     Estimated Creatinine Clearance: 54.1 mL/min (A) (by C-G formula based on SCr of 1.43 mg/dL (H)).   Medical History: Past Medical History:  Diagnosis Date  . Acute on chronic diastolic congestive heart failure (HCC)   . Anemia, iron deficiency    negative egd/colonoscopy 11/16/2015  . Aortic stenosis, severe   . Arthritis   . Bicuspid aortic valve   . DVT (deep venous thrombosis) (HCC)    RLE DVT 11/12/15  . GERD (gastroesophageal reflux disease)   . Heart murmur   . Hypertension   . Insomnia   . Morbid obesity with BMI of 45.0-49.9, adult (HCC)   . PONV (postoperative nausea and vomiting)    took a long time to wake up  . 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 Thyroid disease   . Wears glasses   . Wears partial dentures     Assessment: [age] YOF presented with SOB and leg edema currently getting IV Lasix.  Patient was on Apixaban for history of DVT and now to transition to IV heparin given worsening renal function.    6/8 AM update: aPTT is just  above goal at 108, using aPTT given apixaban influence on anti-Xa levels, no issues per RN.   Goal of Therapy:  Heparin level 0.3-0.7 units/ml aPTT 66 - 102 seconds Monitor platelets by anticoagulation protocol: Yes   Plan:  -Dec heparin to 1150 units/hr -1400 aPTT  Abran DukeJames Gustavia Carie, PharmD, BCPS Clinical Pharmacist Phone: [phone]

## 2018-01-03 ENCOUNTER — Encounter (HOSPITAL_COMMUNITY): Payer: Self-pay | Admitting: Nurse Practitioner

## 2018-01-03 ENCOUNTER — Other Ambulatory Visit: Payer: Self-pay

## 2018-01-03 LAB — BASIC METABOLIC PANEL
Anion gap: 11 (ref 5–15)
BUN: 26 mg/dL — AB (ref 6–20)
CALCIUM: 8.6 mg/dL — AB (ref 8.9–10.3)
CHLORIDE: 105 mmol/L (ref 101–111)
CO2: 26 mmol/L (ref 22–32)
CREATININE: 1.35 mg/dL — AB (ref 0.44–1.00)
GFR calc Af Amer: 47 mL/min — ABNORMAL LOW (ref >60)
GFR calc non Af Amer: 41 mL/min — ABNORMAL LOW (ref >60)
Glucose, Bld: 80 mg/dL (ref 65–99)
Potassium: 3.4 mmol/L — ABNORMAL LOW (ref 3.5–5.1)
SODIUM: 142 mmol/L (ref 135–145)

## 2018-01-03 LAB — HEPATIC FUNCTION PANEL
ALT: 11 U/L — ABNORMAL LOW (ref 14–54)
AST: 17 U/L (ref 15–41)
Albumin: 2.6 g/dL — ABNORMAL LOW (ref 3.5–5.0)
Alkaline Phosphatase: 76 U/L (ref 38–126)
BILIRUBIN DIRECT: 0.4 mg/dL (ref 0.1–0.5)
BILIRUBIN INDIRECT: 0.7 mg/dL (ref 0.3–0.9)
BILIRUBIN TOTAL: 1.1 mg/dL (ref 0.3–1.2)
Total Protein: 6.1 g/dL — ABNORMAL LOW (ref 6.5–8.1)

## 2018-01-03 LAB — HEPARIN LEVEL (UNFRACTIONATED): HEPARIN UNFRACTIONATED: 2.2 [IU]/mL — AB (ref 0.30–0.70)

## 2018-01-03 LAB — CBC
HCT: 32.9 % — ABNORMAL LOW (ref 36.0–46.0)
Hemoglobin: 10.1 g/dL — ABNORMAL LOW (ref 12.0–15.0)
MCH: 32.7 pg (ref 26.0–34.0)
MCHC: 30.7 g/dL (ref 30.0–36.0)
MCV: 106.5 fL — ABNORMAL HIGH (ref 78.0–100.0)
PLATELETS: 236 10*3/uL (ref 150–400)
RBC: 3.09 MIL/uL — ABNORMAL LOW (ref 3.87–5.11)
RDW: 16.7 % — AB (ref 11.5–15.5)
WBC: 6 10*3/uL (ref 4.0–10.5)

## 2018-01-03 LAB — APTT: APTT: 44 s — AB (ref 24–36)

## 2018-01-03 LAB — MAGNESIUM: Magnesium: 2.3 mg/dL (ref 1.7–2.4)

## 2018-01-03 MED ORDER — APIXABAN 5 MG PO TABS
5.0000 mg | ORAL_TABLET | Freq: Two times a day (BID) | ORAL | Status: AC
  Administered 2018-01-03 – 2018-01-07 (×10): 5 mg via ORAL
  Filled 2018-01-03 (×10): qty 1

## 2018-01-03 MED ORDER — CEPHALEXIN 500 MG PO CAPS
500.0000 mg | ORAL_CAPSULE | Freq: Four times a day (QID) | ORAL | Status: DC
  Administered 2018-01-03 – 2018-01-05 (×8): 500 mg via ORAL
  Filled 2018-01-03 (×9): qty 1

## 2018-01-03 MED ORDER — POTASSIUM CHLORIDE CRYS ER 20 MEQ PO TBCR
40.0000 meq | EXTENDED_RELEASE_TABLET | Freq: Two times a day (BID) | ORAL | Status: DC
  Administered 2018-01-03 – 2018-01-04 (×3): 40 meq via ORAL
  Filled 2018-01-03 (×3): qty 2

## 2018-01-03 MED ORDER — FUROSEMIDE 10 MG/ML IJ SOLN
80.0000 mg | Freq: Three times a day (TID) | INTRAMUSCULAR | Status: DC
Start: 2018-01-03 — End: 2018-01-05
  Administered 2018-01-03 – 2018-01-05 (×7): 80 mg via INTRAVENOUS
  Filled 2018-01-03 (×7): qty 8

## 2018-01-03 NOTE — Progress Notes (Addendum)
ANTICOAGULATION CONSULT NOTE  Pharmacy Consult:  Heparin (Apixaban on hold) Indication:  History of DVT  Allergies  Allergen Reactions  . No Known Allergies     Patient Measurements: Height: 5\' 5"  (165.1 cm) Weight: 280 lb 11.2 oz (127.3 kg) IBW/kg (Calculated) : 57 Heparin Dosing Weight: 88 kg  Vital Signs: Temp: 98.6 F (37 C) (06/09 0549) Temp Source: Oral (06/09 0549) BP: 101/63 (06/09 0549) Pulse Rate: 90 (06/09 0549)  Labs: Recent Labs    12/31/17 1524 01/01/18 0353 01/01/18 0944 01/02/18 0434 01/02/18 1320 01/03/18 0634  HGB 10.5*  --   --  10.2*  --   --   HCT 34.5*  --   --  34.4*  --   --   PLT 239  --   --  247  --   --   APTT  --   --   --  108* 125* 44*  HEPARINUNFRC  --   --   --  >2.20*  --  2.20*  CREATININE 1.45* 1.56*  --  1.43*  --  1.35*  TROPONINI  --  <0.03 <0.03  --   --   --     Estimated Creatinine Clearance: 57.3 mL/min (A) (by C-G formula based on SCr of 1.35 mg/dL (H)).   Medical History: Past Medical History:  Diagnosis Date  . Acute on chronic diastolic congestive heart failure (HCC)   . Anemia, iron deficiency    negative egd/colonoscopy 11/16/2015  . Aortic stenosis, severe   . Arthritis   . Bicuspid aortic valve   . DVT (deep venous thrombosis) (HCC)    RLE DVT 11/12/15  . GERD (gastroesophageal reflux disease)   . Heart murmur   . Hypertension   . Insomnia   . Morbid obesity with BMI of 45.0-49.9, adult (HCC)   . PONV (postoperative nausea and vomiting)    took a long time to wake up  . 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 Thyroid disease   . Wears glasses   . Wears partial dentures     Assessment: 89 YOF presented with SOB and leg edema currently getting IV Lasix.  Patient was on Apixaban (last reported  dose 6/6 at 8am) for history of DVT and transitioned to IV heparin given worsening renal function.   -aPTT = 44 this morning, hemoglobin and platelets are stable. Noted no concerns with bleeding or heparin drip overnight.   Goal of Therapy:  Heparin level 0.3-0.7 units/ml aPTT 66 - 102 seconds Monitor platelets by anticoagulation protocol: Yes   Plan:   Increase heparin drip to 1100 Check 6h aPTT/ HL  Monitor for S/sx of bleeding  Blake DivineShannon Vernon Ariel, Pharm.D. PGY1 Pharmacy Resident 01/03/2018 9:37 AM Phone: [phone]  ADDENDUM:  Consulted to resume home apixaban this afternoon and discontinue heparin drip. Will resume apixaban 5mg  PO BID for history of DVT.

## 2018-01-03 NOTE — Progress Notes (Signed)
PROGRESS NOTE    Christine PattenJudith M Donson  ONG:[id number] DOB: 05/22/1956 DOA: 12/31/2017 PCP: Annita BrodAsenso, Philip, MD   Brief Narrative:   Christine Hicks is Griffith Santilli 62 y.o. female with medical history significant of morbid obesity, hypertension, dCHF, DVT on Eliquis, GERD, CKD-2, aortic stenosis, s/p of aortic root replacement and valve replacement, iron deficiency anemia, who presents with shortness breath and leg edema.  Patient states that she has shortness of breath in the past several days, which has been progressively getting worse.  She does not have chest pain. She has dry cough.  She also noted worsening bilateral leg edema.  Her primary care doctor increased her torsemide dose to 60 mg twice [place] day without significant improvement.  Patient states that she has gained weight by approximately 20 pounds since April.  She has generalized weakness.  She states that she fell twice accidentally without significant injury.  She has bruise on the right arm.  No unilateral weakness, numbness or tingling his extremities.  No facial droop or slurred speech.  Denies nausea, vomiting, diarrhea, abdominal pain, symptoms of UTI.  Assessment & Plan:   Principal Problem:   Acute on chronic diastolic heart failure (HCC) Active Problems:   Essential hypertension   DVT, recurrent, lower extremity, chronic, bilateral (HCC)   Iron deficiency anemia   GERD (gastroesophageal reflux disease)   Acute renal failure superimposed on stage 2 chronic kidney disease (HCC)   Acute on chronic diastolic heart failure [facility](HCC): Patient has elevated BNP 408, lateral leg edema, pulmonary vascular congestion on chest x-ray, consistent with CHF exacerbation. - On torsemide 40 BID as outpatient, increased to 60 BID prior to coming in for admission - She notes she feels good around 255 lbs (weight day prior to discharge last admission was ~257) - 282 lbs on hospital day 1, but weight stable today - increase lasix to 80 mg TID - trop x 3 - 2d  echo (EF 65-70%, severe TV regurgitation, increased PASP - see report) - will continue home metoprolol, ASA - Daily weights - strict I/O's - Low salt diet - may need to consult cards if difficulty with diuresis  Wt Readings from Last 3 Encounters:  01/03/18 127.3 kg (280 lb 11.2 oz)  12/29/17 119.3 kg (263 lb)  12/01/17 120.6 kg (265 lb 12.8 oz)   HTN:  -Continue home medications: Metoprolol (decrease to 12.5 mg BID) - BP's actually soft, but stable today, ctm with diuresis  Bradycardia: sinus brady noted on tele, will decrease metop, continue to monitor  Falls: sounds like she had 2 mechanical falls (1 where she slipped and 1 where grab bar broke).  On exam, symmetric strength and no focal deficits.  Will diurese as noted above.  PT consult.  Negative head CT. - home health PT recommended as well as 24 hour supervision/assistance  Tremor: describes tremor in bilateral hands that started after torsemide dose increased.  Notes this for past few weeks.  TSH wnl.   - continue to monitor, follow up outpatient  Vitamin B12 deficiency: Vitamin B12 IM x 7 days.  Follow folate.   Hypothyroidism: synthroid  DVT, recurrent, lower extremity, chronic, bilateral (HCC): -on eliquis, will resume home eliquis with improving kidney function  Right greater than Left Lower Extremity Edema: likely due to above, follow up US.    Cellulitis vs venous stasis changes: RLE with redness which seems Alsha Meland bit worse today.  Will start on keflex x5 days.  Iron deficiency anemia: -Continue iron supplement  GERD: -  Protonix  Acute renal failure superimposed on stage 2 chronic kidney disease (HCC): Baseline creatinine 1.15 on 12/01/2017.  Creatinine peaked to 1.56, seems to be improving with diuresis.  Follow. - Follow urinalysis (no protein or RBC's) - follow renal function while diuresing - further workup as indicated  Hypokalemia: replete, follow mag.  Stable, follow.  BID potassium.  Prolonged QT:  replete K and follow mag.  Limit QT prolonging meds.   DVT prophylaxis: heparin -> home eliquis Code Status: full  Family Communication: son at bedside Disposition Plan: pending improvement   Consultants:   none  Procedures:   none  Antimicrobials:   none    Subjective: Feeling ok. No complaints, some LE pain. Asking about diet.    Objective: Vitals:   01/02/18 1156 01/02/18 1951 01/03/18 0549 01/03/18 1100  BP: 99/73 (!) 101/58 101/63 98/63  Pulse: 74 73 90 75  Resp:  20 20 20   Temp:  99.3 F (37.4 C) 98.6 F (37 C) 98.7 F (37.1 C)  TempSrc:  Oral Oral Oral  SpO2: 99% 92% 93% 100%  Weight:   127.3 kg (280 lb 11.2 oz)   Height:        Intake/Output Summary (Last 24 hours) at 01/03/2018 1309 Last data filed at 01/03/2018 1000 Gross per 24 hour  Intake 1817.74 ml  Output 1751 ml  Net 66.74 ml   Filed Weights   01/01/18 0500 01/02/18 0442 01/03/18 0549  Weight: 128.1 kg (282 lb 6.6 oz) 127.3 kg (280 lb 9.6 oz) 127.3 kg (280 lb 11.2 oz)    Examination:  General: No acute distress. Cardiovascular: Heart sounds show Shakir Petrosino regular rate, and rhythm. No gallops or rubs. No murmurs. No JVD. Lungs: Clear to auscultation bilaterally with good air movement. No rales, rhonchi or wheezes. Abdomen: Soft, nontender, nondistended with normal active bowel sounds. No masses. No hepatosplenomegaly. Neurological: Alert and oriented 3. Moves all extremities 4. Cranial nerves II through XII grossly intact. Skin: Warm and dry. No rashes or lesions. Extremities: R>L LEE.  ? Erythema more than previous days on R Psychiatric: Mood and affect are normal. Insight and judgment are appropriate.   Data Reviewed: I have personally reviewed following labs and imaging studies  CBC: Recent Labs  Lab 12/31/17 1524 01/02/18 0434 01/03/18 0923  WBC 6.1 6.7 6.0  HGB 10.5* 10.2* 10.1*  HCT 34.5* 34.4* 32.9*  MCV 107.1* 105.5* 106.5*  PLT 239 247 236   Basic Metabolic Panel: Recent  Labs  Lab 12/31/17 1524 01/01/18 0353 01/02/18 0434 01/03/18 0634  NA 141 142 141 142  K 3.8 3.2* 4.0 3.4*  CL 107 108 106 105  CO2 25 24 26 26   GLUCOSE 108* 101* 102* 80  BUN 27* 29* 29* 26*  CREATININE 1.45* 1.56* 1.43* 1.35*  CALCIUM 8.8* 8.6* 8.7* 8.6*  MG  --  2.4 2.5* 2.3   GFR: Estimated Creatinine Clearance: 57.3 mL/min (Saisha Hogue) (by C-G formula based on SCr of 1.35 mg/dL (H)). Liver Function Tests: Recent Labs  Lab 01/03/18 0634  AST 17  ALT 11*  ALKPHOS 76  BILITOT 1.1  PROT 6.1*  ALBUMIN 2.6*   No results for input(s): LIPASE, AMYLASE in the last 168 hours. No results for input(s): AMMONIA in the last 168 hours. Coagulation Profile: No results for input(s): INR, PROTIME in the last 168 hours. Cardiac Enzymes: Recent Labs  Lab 01/01/18 0353 01/01/18 0944  TROPONINI <0.03 <0.03   BNP (last 3 results) No results for input(s): PROBNP in  the last 8760 hours. HbA1C: No results for input(s): HGBA1C in the last 72 hours. CBG: No results for input(s): GLUCAP in the last 168 hours. Lipid Profile: No results for input(s): CHOL, HDL, LDLCALC, TRIG, CHOLHDL, LDLDIRECT in the last 72 hours. Thyroid Function Tests: Recent Labs    01/02/18 0434  TSH 0.487   Anemia Panel: Recent Labs    01/02/18 0434  VITAMINB12 155*  FOLATE 7.0   Sepsis Labs: No results for input(s): PROCALCITON, LATICACIDVEN in the last 168 hours.  No results found for this or any previous visit (from the past 240 hour(s)).       Radiology Studies: No results found.      Scheduled Meds: . aspirin EC  81 mg Oral Daily  . cyanocobalamin  1,000 mcg Intramuscular Daily  . ferrous sulfate  325 mg Oral BID WC  . furosemide  80 mg Intravenous TID  . levothyroxine  175 mcg Oral QAC breakfast  . magnesium oxide  400 mg Oral Daily  . metoprolol tartrate  12.5 mg Oral BID  . pantoprazole  40 mg Oral Daily  . potassium chloride  40 mEq Oral BID  . sodium chloride flush  3 mL  Intravenous Q12H   Continuous Infusions: . sodium chloride    . heparin 1,100 Units/hr (01/03/18 0956)     LOS: 3 days    Time spent: over 30 min    Lacretia Nicks, MD [facility] Pager [phone]  If 7PM-7AM, please contact night-coverage [email or web address] Password [email or web address] 01/03/2018, 1:09 PM

## 2018-01-04 DIAGNOSIS — E876 Hypokalemia: Secondary | ICD-10-CM

## 2018-01-04 DIAGNOSIS — I5033 Acute on chronic diastolic (congestive) heart failure: Secondary | ICD-10-CM

## 2018-01-04 LAB — BASIC METABOLIC PANEL
Anion gap: 10 (ref 5–15)
BUN: 23 mg/dL — AB (ref 6–20)
CALCIUM: 8.4 mg/dL — AB (ref 8.9–10.3)
CHLORIDE: 104 mmol/L (ref 101–111)
CO2: 25 mmol/L (ref 22–32)
CREATININE: 1.19 mg/dL — AB (ref 0.44–1.00)
GFR calc Af Amer: 55 mL/min — ABNORMAL LOW (ref >60)
GFR calc non Af Amer: 48 mL/min — ABNORMAL LOW (ref >60)
Glucose, Bld: 134 mg/dL — ABNORMAL HIGH (ref 65–99)
Potassium: 3.1 mmol/L — ABNORMAL LOW (ref 3.5–5.1)
SODIUM: 139 mmol/L (ref 135–145)

## 2018-01-04 LAB — CBC
HCT: 32.3 % — ABNORMAL LOW (ref 36.0–46.0)
Hemoglobin: 9.8 g/dL — ABNORMAL LOW (ref 12.0–15.0)
MCH: 32.3 pg (ref 26.0–34.0)
MCHC: 30.3 g/dL (ref 30.0–36.0)
MCV: 106.6 fL — AB (ref 78.0–100.0)
PLATELETS: 221 10*3/uL (ref 150–400)
RBC: 3.03 MIL/uL — ABNORMAL LOW (ref 3.87–5.11)
RDW: 16.8 % — AB (ref 11.5–15.5)
WBC: 5.7 10*3/uL (ref 4.0–10.5)

## 2018-01-04 LAB — MAGNESIUM: MAGNESIUM: 2.2 mg/dL (ref 1.7–2.4)

## 2018-01-04 MED ORDER — METOLAZONE 2.5 MG PO TABS
2.5000 mg | ORAL_TABLET | Freq: Every day | ORAL | Status: DC
  Administered 2018-01-04 – 2018-01-07 (×4): 2.5 mg via ORAL
  Filled 2018-01-04 (×4): qty 1

## 2018-01-04 MED ORDER — POTASSIUM CHLORIDE CRYS ER 20 MEQ PO TBCR
40.0000 meq | EXTENDED_RELEASE_TABLET | Freq: Once | ORAL | Status: AC
  Administered 2018-01-04: 40 meq via ORAL
  Filled 2018-01-04: qty 2

## 2018-01-04 MED ORDER — SPIRONOLACTONE 12.5 MG HALF TABLET
12.5000 mg | ORAL_TABLET | Freq: Every day | ORAL | Status: DC
  Administered 2018-01-04: 12.5 mg via ORAL
  Filled 2018-01-04 (×2): qty 1

## 2018-01-04 NOTE — Progress Notes (Signed)
Occupational Therapy Treatment Patient Details Name: Christine Hicks MRN: [id number] DOB: [birth year] Today's Date: 01/04/2018    History of present illness 62 y.o. female c/o SOB and LE edema with CHF. PMHx: multiple falls, morbid obesity, HTN, dCHF, DVT on Eliquis, GERD, CKD-2, aortic stenosis, previous aortic root replacement and valve replacement (2018),  iron deficiency anemia   OT comments  Pt educated in energy conservation strategies and performed toileting, UB dressing and standing grooming with supervision. Pt unaware of sodium content of food when questioned about how she could avoid readmission to the hospital. Pt stating she believed she was doing everything right. Could benefit from further education in how to manage her disease. Pt has AE for LB dressing, but relies on her family's assistance at home, not interested in education.  Follow Up Recommendations  No OT follow up;Supervision - Intermittent    Equipment Recommendations  None recommended by OT    Recommendations for Other Services      Precautions / Restrictions Precautions Precautions: Fall       Mobility Bed Mobility Overal bed mobility: Needs Assistance Bed Mobility: Supine to Sit     Supine to sit: Supervision     General bed mobility comments: increased time, HOB up, + rail  Transfers Overall transfer level: Needs assistance Equipment used: Rolling walker (2 wheeled) Transfers: Sit to/from Stand Sit to Stand: Supervision         General transfer comment: increased time, cues to scoot hips to EOB and use momentum    Balance                                           ADL either performed or assessed with clinical judgement   ADL Overall ADL's : Needs assistance/impaired     Grooming: Oral care;Standing;Supervision/safety           Upper Body Dressing : Set up;Sitting   Lower Body Dressing: Minimal assistance;Sit to/from stand Lower Body Dressing Details (indicate  cue type and reason): pt has a reacher and long shoe horn at home, but relies on family to assist her with LB dressing, not likely to use a sock aide Toilet Transfer: Supervision/safety;Ambulation;RW   Toileting- Clothing Manipulation and Hygiene: Sit to/from stand;Supervision/safety         General ADL Comments: Reinforced use of tub transfer bench for safety and energy conservation.     Vision       Perception     Praxis      Cognition Arousal/Alertness: Awake/alert Behavior During Therapy: WFL for tasks assessed/performed Overall Cognitive Status: Within Functional Limits for tasks assessed                                 General Comments: Pt concerned with incontinence due to meds but willing to participate with OT with encouragement        Exercises     Shoulder Instructions       General Comments      Pertinent Vitals/ Pain       Pain Assessment: No/denies pain  Home Living                                          Prior  Functioning/Environment              Frequency  Min 2X/week        Progress Toward Goals  OT Goals(current goals can now be found in the care plan section)  Progress towards OT goals: Progressing toward goals  Acute Rehab OT Goals Patient Stated Goal: to get strength back and get back to normal  OT Goal Formulation: With patient Time For Goal Achievement: 01/15/18 Potential to Achieve Goals: Good  Plan Discharge plan remains appropriate    Co-evaluation                 AM-PAC PT "6 Clicks" Daily Activity     Outcome Measure   Help from another person eating meals?: None Help from another person taking care of personal grooming?: A Little Help from another person toileting, which includes using toliet, bedpan, or urinal?: A Little Help from another person bathing (including washing, rinsing, drying)?: A Little Help from another person to put on and taking off regular upper body  clothing?: A Little Help from another person to put on and taking off regular lower body clothing?: A Little 6 Click Score: 19    End of Session Equipment Utilized During Treatment: Gait belt;Rolling walker  OT Visit Diagnosis: Unsteadiness on feet (R26.81)   Activity Tolerance Patient tolerated treatment well   Patient Left in bed;with call bell/phone within reach   Nurse Communication          Time: [id number] OT Time Calculation (min): 17 min  Charges: OT General Charges $OT Visit: 1 Visit OT Treatments $Self Care/Home Management : 8-22 mins  01/04/2018 Martie RoundJulie Jaydon Avina, OTR/L Pager: [phone]   Evern BioMayberry, Reighan Hipolito Lynn 01/04/2018, 9:42 AM

## 2018-01-04 NOTE — Progress Notes (Signed)
PROGRESS NOTE    [name]  UEA:[id number] DOB: [birth year] DOA: 12/31/2017 PCP: Annita Brod, MD   Brief Narrative:   Christine Hicks is Christine Hicks 62 y.o. female with medical history significant of morbid obesity, hypertension, dCHF, DVT on Eliquis, GERD, CKD-2, aortic stenosis, s/p of aortic root replacement and valve replacement, iron deficiency anemia, who presents with shortness breath and leg edema.  Patient states that she has shortness of breath in the past several days, which has been progressively getting worse.  She does not have chest pain. She has dry cough.  She also noted worsening bilateral leg edema.  Her primary care doctor increased her torsemide dose to 60 mg twice Yvonne Petite day without significant improvement.  Patient states that she has gained weight by approximately 20 pounds since April.  She has generalized weakness.  She states that she fell twice accidentally without significant injury.  She has bruise on the right arm.  No unilateral weakness, numbness or tingling his extremities.  No facial droop or slurred speech.  Denies nausea, vomiting, diarrhea, abdominal pain, symptoms of UTI.  Assessment & Plan:   Principal Problem:   Acute on chronic diastolic heart failure (HCC) Active Problems:   Essential hypertension   DVT, recurrent, lower extremity, chronic, bilateral (HCC)   Iron deficiency anemia   GERD (gastroesophageal reflux disease)   Acute renal failure superimposed on stage 2 chronic kidney disease (HCC)   Acute on chronic diastolic heart failure [facility]): Patient has elevated BNP 408, lateral leg edema, pulmonary vascular congestion on chest x-ray, consistent with CHF exacerbation. - On torsemide 40 BID as outpatient, increased to 60 BID prior to coming in for admission - She notes she feels good around 255 lbs (weight day prior to discharge last admission was ~257) - 282 lbs on hospital day 1, weight continues to be stable with poor UOP recorded - lasix to 80 mg  TID - cardiology consult, appreciate recommendations - 2d echo (EF 65-70%, severe TV regurgitation, increased PASP - see report) - will continue home metoprolol, ASA - Daily weights - strict I/O's - Low salt diet  Wt Readings from Last 3 Encounters:  01/04/18 127.9 kg (281 lb 14.4 oz)  12/29/17 119.3 kg (263 lb)  12/01/17 120.6 kg (265 lb 12.8 oz)   HTN:  -Continue home medications: Metoprolol (decrease to 12.5 mg BID) - BP's actually soft, but stable today, ctm with diuresis  Bradycardia: sinus brady noted on tele, will decrease metop, continue to monitor  Falls: sounds like she had 2 mechanical falls (1 where she slipped and 1 where grab bar broke).  On exam, symmetric strength and no focal deficits.  Will diurese as noted above.  PT consult.  Negative head CT. - home health PT recommended as well as 24 hour supervision/assistance  Tremor: describes tremor in bilateral hands (but worse on L) for past few weeks.  Worse with activity.  TSH wnl.   - continue to monitor, follow up outpatient  Vitamin B12 deficiency: Vitamin B12 IM x 7 days, then weekly x 4 weeks.  After that will need monthly.  Follow folate (wnl).   Hypothyroidism: synthroid  DVT, recurrent, lower extremity, chronic, bilateral (HCC): -on eliquis continue eliquis  Right greater than Left Lower Extremity Edema: likely due to above, follow up US.    Cellulitis vs venous stasis changes: RLE with redness which seemed Prateek Knipple bit worse 6/9.  Stable 6/10.  Follow on keflex.   Iron deficiency anemia: -Continue iron supplement  GERD: -Protonix  Acute renal failure superimposed on stage 2 chronic kidney disease (HCC): Baseline creatinine 1.15 on 12/01/2017.  Creatinine peaked to 1.56, continues to improve with diuresis. - Follow urinalysis (no protein or RBC's) - follow renal function while diuresing - further workup as indicated  Hypokalemia: replete, follow mag.  Stable, follow.  BID potassium.  Prolonged QT:  replete K and follow mag.  Limit QT prolonging meds.   DVT prophylaxis: home eliquis Code Status: full  Family Communication: non at bedside today Disposition Plan: pending improvement   Consultants:   none  Procedures:   none  Antimicrobials:   none    Subjective: Feels about the same. No complaints at this time.   Objective: Vitals:   01/03/18 2022 01/04/18 0300 01/04/18 0515 01/04/18 0900  BP: (!) 95/54  92/67 106/64  Pulse: 77  89   Resp: 18  18   Temp: 98.9 F (37.2 C)  97.6 F (36.4 C)   TempSrc: Oral  Oral   SpO2: 97%  99%   Weight:  127.9 kg (281 lb 14.4 oz)    Height:        Intake/Output Summary (Last 24 hours) at 01/04/2018 1037 Last data filed at 01/04/2018 0853 Gross per 24 hour  Intake 1435.59 ml  Output 1500 ml  Net -64.41 ml   Filed Weights   01/02/18 0442 01/03/18 0549 01/04/18 0300  Weight: 127.3 kg (280 lb 9.6 oz) 127.3 kg (280 lb 11.2 oz) 127.9 kg (281 lb 14.4 oz)    Examination:  General: No acute distress. Cardiovascular: Heart sounds show Rainelle Sulewski regular rate, and rhythm. No gallops or rubs. No murmurs. No JVD. Lungs: Clear to auscultation bilaterally with good air movement. No rales, rhonchi or wheezes. Abdomen: Soft, nontender, nondistended with normal active bowel sounds. No masses. No hepatosplenomegaly. Neurological: Alert and oriented 3. Moves all extremities 4. Cranial nerves II through XII grossly intact. Skin: Warm and dry. No rashes or lesions. Extremities: R>L LEE.  ? Erythema more than previous days on R Psychiatric: Mood and affect are normal. Insight and judgment are appropriate.   Data Reviewed: I have personally reviewed following labs and imaging studies  CBC: Recent Labs  Lab 12/31/17 1524 01/02/18 0434 01/03/18 0923 01/04/18 0514  WBC 6.1 6.7 6.0 5.7  HGB 10.5* 10.2* 10.1* 9.8*  HCT 34.5* 34.4* 32.9* 32.3*  MCV 107.1* 105.5* 106.5* 106.6*  PLT 239 247 236 221   Basic Metabolic Panel: Recent Labs   Lab 12/31/17 1524 01/01/18 0353 01/02/18 0434 01/03/18 0634 01/04/18 0514  NA 141 142 141 142 139  K 3.8 3.2* 4.0 3.4* 3.1*  CL 107 108 106 105 104  CO2 25 24 26 26 25   GLUCOSE 108* 101* 102* 80 134*  BUN 27* 29* 29* 26* 23*  CREATININE 1.45* 1.56* 1.43* 1.35* 1.19*  CALCIUM 8.8* 8.6* 8.7* 8.6* 8.4*  MG  --  2.4 2.5* 2.3 2.2   GFR: Estimated Creatinine Clearance: 65.2 mL/min (Makenli Derstine) (by C-G formula based on SCr of 1.19 mg/dL (H)). Liver Function Tests: Recent Labs  Lab 01/03/18 0634  AST 17  ALT 11*  ALKPHOS 76  BILITOT 1.1  PROT 6.1*  ALBUMIN 2.6*   No results for input(s): LIPASE, AMYLASE in the last 168 hours. No results for input(s): AMMONIA in the last 168 hours. Coagulation Profile: No results for input(s): INR, PROTIME in the last 168 hours. Cardiac Enzymes: Recent Labs  Lab 01/01/18 0353 01/01/18 0944  TROPONINI <0.03 <0.03  BNP (last 3 results) No results for input(s): PROBNP in the last 8760 hours. HbA1C: No results for input(s): HGBA1C in the last 72 hours. CBG: No results for input(s): GLUCAP in the last 168 hours. Lipid Profile: No results for input(s): CHOL, HDL, LDLCALC, TRIG, CHOLHDL, LDLDIRECT in the last 72 hours. Thyroid Function Tests: Recent Labs    01/02/18 0434  TSH 0.487   Anemia Panel: Recent Labs    01/02/18 0434  VITAMINB12 155*  FOLATE 7.0   Sepsis Labs: No results for input(s): PROCALCITON, LATICACIDVEN in the last 168 hours.  No results found for this or any previous visit (from the past 240 hour(s)).       Radiology Studies: No results found.      Scheduled Meds: . apixaban  5 mg Oral BID  . aspirin EC  81 mg Oral Daily  . cephALEXin  500 mg Oral Q6H  . cyanocobalamin  1,000 mcg Intramuscular Daily  . ferrous sulfate  325 mg Oral BID WC  . furosemide  80 mg Intravenous TID  . levothyroxine  175 mcg Oral QAC breakfast  . magnesium oxide  400 mg Oral Daily  . metoprolol tartrate  12.5 mg Oral BID  .  pantoprazole  40 mg Oral Daily  . potassium chloride  40 mEq Oral BID  . potassium chloride  40 mEq Oral Once  . sodium chloride flush  3 mL Intravenous Q12H   Continuous Infusions: . sodium chloride       LOS: 4 days    Time spent: over 30 min    Lacretia Nicks, MD [facility] Pager [phone]  If 7PM-7AM, please contact night-coverage [email or web address] Password [facility] 01/04/2018, 10:37 AM

## 2018-01-04 NOTE — Consult Note (Addendum)
Advanced Heart Failure Team Consult Note   Primary Physician: Annita BrodAsenso, Philip, MD PCP-Cardiologist:  Lesleigh NoeHenry W Smith III, MD  Reason for Consultation: HF  HPI:    Christine Hicks is seen today for evaluation of heart failure at the request of Dr Katrinka BlazingSmith.    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   Admitted 4/15 through 11/20/17 with a/c diastolic heart failure. Prior to admit she was n Diuresed 28 liters. Discharged on torsemide 40 mg twice a day. Discharge weight 257 pounds.   She was evaluated by Norma FredricksonLori Gerhardt on 12/01/2017. At that time she was stable and continued on torsemide 40 mg twice a day.   Over the last 2 weeks she has gained 20 pounds. Weight went up from 255-->275 pounds. PCP increased torsemide to 60 mg twice a day but it has had little effect Says she has not missed any medications. She has been trying to limit fluid intake and follow low salt diet however she snacks on peanuts and likes to drink water. Has very limited mobility. Uses walker in the house and an electric scooter in the grocery store. [facility]   Sleep study completed 12/29/2017.   Presented to [facility] ED on 6/6 with increased dyspnea and leg edema. Weight up to 282.  CXR with vascular congestion. Pertinent admission labs include: BNP 408, K 3.8, creatinine 1.45, troponin < 0.03, Hgb 10.5. She has been diuresing with IV lasix 80 mg twice a day but weight is trending up.     ECHO 01/01/2018  Left ventricle: The cavity size was normal. Systolic function was   vigorous. The estimated ejection fraction was in the range of 65%   to 70%. Wall motion was normal; there were no regional wall   motion abnormalities. There was no evidence of elevated   ventricular filling pressure by Doppler parameters. - Right atrium:  The atrium was severely dilated. - Tricuspid valve: There was severe regurgitation. - Pulmonary arteries: Systolic pressure was mildly increased. PA   peak pressure: 39 mm Hg (S).   Review of Systems: [y] = yes, [ ]  = no   General: Weight gain [Y ]; Weight loss [ ] ; Anorexia [ ] ; Fatigue [ Y]; Fever [ ] ; Chills [ ] ; Weakness Cove.Etienne[y ]  Cardiac: Chest pain/pressure [ ] ; Resting SOB Cove.Etienne[y ]; Exertional SOB [Y ]; Orthopnea [Y]; Pedal Edema [ Y]; Palpitations [ ] ; Syncope [ ] ; Presyncope [ ] ; Paroxysmal nocturnal dyspnea[ ]   Pulmonary: Cough [ ] ; Wheezing[ ] ; Hemoptysis[ ] ; Sputum [ ] ; Snoring [ ]   GI: Vomiting[ ] ; Dysphagia[ ] ; Melena[ ] ; Hematochezia [ ] ; Heartburn[ ] ; Abdominal pain [ ] ; Constipation [ ] ; Diarrhea [ ] ; BRBPR [ ]   GU: Hematuria[ ] ; Dysuria [ ] ; Nocturia[ ]   Vascular: Pain in legs with walking [ ] ; Pain in feet with lying flat [ ] ; Non-healing sores [ ] ; Stroke [ ] ; TIA [ ] ; Slurred speech [ ] ;  Neuro: Headaches[ ] ; Vertigo[ ] ; Seizures[ ] ; Paresthesias[ ] ;Blurred vision [ ] ; Diplopia [ ] ; Vision changes [ ]   Ortho/Skin: Arthritis [ y]; Joint pain [ Y]; Muscle pain [ ] ; Joint swelling [ ] ; Back Pain [ Y]; Rash [ ]   Psych: Depression[ ] ; Anxiety[ ]   Heme: Bleeding problems [ ] ; Clotting disorders [ ] ; Anemia [ ]   Endocrine: Diabetes [ ] ; Thyroid dysfunction[y ]  Home Medications Prior to Admission medications   Medication Sig Start Date End Date Taking? Authorizing Provider  acetaminophen (TYLENOL) 500 MG tablet Take 1,000 mg by mouth every 6 (six) hours as needed (arthritis).   Yes [provider]  aspirin EC 81 MG tablet Take 81 mg by mouth daily.   Yes [provider]  ELIQUIS 5 MG TABS tablet TAKE 1 TABLET BY MOUTH TWICE A DAY Patient taking differently: TAKE 1 TABLET (5mg ) BY MOUTH TWICE A DAY 07/27/17  Yes Lyn Records, MD  ferrous sulfate 325 (65 FE) MG tablet Take 1 tablet (325 mg total) by mouth 2 (two) times daily with a meal. 11/18/15  Yes Ghimire,  Werner Lean, MD  levothyroxine (SYNTHROID, LEVOTHROID) 175 MCG tablet Take 175 mcg by mouth daily. 12/19/17  Yes [provider]  Magnesium 400 MG CAPS Take 400 mg by mouth daily. 11/20/17  Yes Rhetta Mura, MD  metoprolol tartrate (LOPRESSOR) 25 MG tablet TAKE 1 TABLET BY MOUTH TWICE A DAY 11/30/17  Yes Leone Brand, NP  pantoprazole (PROTONIX) 40 MG tablet Take 1 tablet (40 mg total) by mouth daily. 12/14/17  Yes Lyn Records, MD  potassium chloride SA (KLOR-CON M20) 20 MEQ tablet Take 2 tablets (40 mEq total) by mouth 2 (two) times daily. Patient taking differently: Take 60 mEq by mouth 2 (two) times daily.  11/20/17  Yes Rhetta Mura, MD  torsemide (DEMADEX) 20 MG tablet Take 3 tablets (60 mg total) by mouth 2 (two) times daily. 12/28/17  Yes Rosalio Macadamia, NP  zolpidem (AMBIEN) 5 MG tablet Take 1 tablet (5 mg total) by mouth at bedtime as needed for sleep. 11/20/17  Yes Rhetta Mura, MD    Past Medical History: Past Medical History:  Diagnosis Date  . Acute on chronic diastolic congestive heart failure (HCC)   . Anemia, iron deficiency    negative egd/colonoscopy 11/16/2015  . Aortic stenosis, severe   . Arthritis   . Bicuspid aortic valve   . DVT (deep venous thrombosis) (HCC)    RLE DVT 11/12/15  . GERD (gastroesophageal reflux disease)   . Heart murmur   . Hypertension   . Insomnia   . Morbid obesity with BMI of 45.0-49.9, adult (HCC)   . PONV (postoperative nausea and vomiting)    took a long time to wake up  . 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 Thyroid disease   . Wears glasses   . Wears partial dentures     Past Surgical History: Past Surgical History:  Procedure Laterality Date  . ABDOMINAL SURGERY     Fistula formation,  complication after hernia  . AORTIC VALVE REPLACEMENT N/A 01/01/2017   Procedure: MINIMALLY INVASIVE AORTIC VALVE REPLACEMENT (AVR);  Surgeon: Purcell Nails, MD;  Location: [facility] OR;  Service: Open Heart Surgery;  Laterality: N/A;  . ASCENDING AORTIC ROOT REPLACEMENT N/A 01/01/2017   Procedure: ASCENDING AORTIC ROOT REPLACEMENT;  Surgeon: Purcell Nails, MD;  Location: [facility] OR;  Service: Open Heart Surgery;  Laterality: N/A;  . CARDIAC CATHETERIZATION     12/16/16  . COLONOSCOPY N/A 11/16/2015   Procedure: COLONOSCOPY;  Surgeon: Iva Boop, MD;  Location: [facility] ENDOSCOPY;  Service: Endoscopy;  Laterality: N/A;  . ESOPHAGOGASTRODUODENOSCOPY  N/A 11/16/2015   Procedure: ESOPHAGOGASTRODUODENOSCOPY (EGD);  Surgeon: Iva Boop, MD;  Location: [facility] ENDOSCOPY;  Service: Endoscopy;  Laterality: N/A;  . HERNIA REPAIR    . MULTIPLE TOOTH EXTRACTIONS    . Right leg cellulitis surgery    . TEE WITHOUT CARDIOVERSION N/A 01/01/2017   Procedure: TRANSESOPHAGEAL ECHOCARDIOGRAM (TEE);  Surgeon: Purcell Nails, MD;  Location: [facility] OR;  Service: Open Heart Surgery;  Laterality: N/A;    Family History: Family History  Problem Relation Age of Onset  . Cardiomyopathy Son   . Heart attack Mother   . Emphysema Father   . Lung cancer Maternal Aunt     Social History: Social History   Socioeconomic History  . Marital status: Significant Other    Spouse name: Not on file  . Number of children: Not on file  . Years of education: Not on file  . Highest education level: Not on file  Occupational History  . Occupation: retired  Engineer, production  . Financial resource strain: Not on file  . Food insecurity:    Worry: Not on file    Inability: Not on file  . Transportation needs:    Medical: Not on file    Non-medical: Not on file  Tobacco Use  . Smoking status: Former Smoker    Types: Cigarettes  . Smokeless tobacco: Never Used  . Tobacco comment: quit smoking cigarettes > 25 years ago  Substance and Sexual  Activity  . Alcohol use: No  . Drug use: No  . Sexual activity: Yes  Lifestyle  . Physical activity:    Days per week: Not on file    Minutes per session: Not on file  . Stress: Not on file  Relationships  . Social connections:    Talks on phone: Not on file    Gets together: Not on file    Attends religious service: Not on file    Active member of club or organization: Not on file    Attends meetings of clubs or organizations: Not on file    Relationship status: Not on file  Other Topics Concern  . Not on file  Social History Narrative  . Not on file    Allergies:  Allergies  Allergen Reactions  . No Known Allergies     Objective:    Vital Signs:   Temp:  [97.6 F (36.4 C)-98.9 F (37.2 C)] 97.6 F (36.4 C) (06/10 0515) Pulse Rate:  [75-89] 89 (06/10 0515) Resp:  [18-20] 18 (06/10 0515) BP: (92-106)/(54-67) 106/64 (06/10 0900) SpO2:  [95 %-100 %] 99 % (06/10 0515) Weight:  [281 lb 14.4 oz (127.9 kg)] 281 lb 14.4 oz (127.9 kg) (06/10 0300) Last BM Date: 01/03/18  Weight change: Filed Weights   01/02/18 0442 01/03/18 0549 01/04/18 0300  Weight: 280 lb 9.6 oz (127.3 kg) 280 lb 11.2 oz (127.3 kg) 281 lb 14.4 oz (127.9 kg)    Intake/Output:   Intake/Output Summary (Last 24 hours) at 01/04/2018 1032 Last data filed at 01/04/2018 0853 Gross per 24 hour  Intake 1435.59 ml  Output 1500 ml  Net -64.41 ml      Physical Exam    General:   No resp difficulty. In bed.  HEENT: normal anicteric  Neck: supple. JVP to jaw. Carotids 2+ bilat; no bruits. No lymphadenopathy or thyromegaly appreciated. Cor: PMI nondisplaced. Regular rate & rhythm. 2/6 SEM LUSB pronounced P2 Lungs: clear Abdomen: markedly obese, soft, nontender, nondistended. No hepatosplenomegaly. No bruits or masses. Good  bowel sounds. Extremities: no cyanosis, clubbing, rash, R and LLE 2+  Edema. RUE ecchymotic Neuro: alert & oriented x 3, cranial nerves grossly intact. moves all 4 extremities w/o  difficulty. Affect pleasant    Telemetry   SR 80-90s Personally reviewed   EKG    SR RBBB QRS 136 Christine Personally reviewed  Labs   Basic Metabolic Panel: Recent Labs  Lab 12/31/17 1524 01/01/18 0353 01/02/18 0434 01/03/18 0634 01/04/18 0514  NA 141 142 141 142 139  K 3.8 3.2* 4.0 3.4* 3.1*  CL 107 108 106 105 104  CO2 25 24 26 26 25   GLUCOSE 108* 101* 102* 80 134*  BUN 27* 29* 29* 26* 23*  CREATININE 1.45* 1.56* 1.43* 1.35* 1.19*  CALCIUM 8.8* 8.6* 8.7* 8.6* 8.4*  MG  --  2.4 2.5* 2.3 2.2    Liver Function Tests: Recent Labs  Lab 01/03/18 0634  AST 17  ALT 11*  ALKPHOS 76  BILITOT 1.1  PROT 6.1*  ALBUMIN 2.6*   No results for input(s): LIPASE, AMYLASE in the last 168 hours. No results for input(s): AMMONIA in the last 168 hours.  CBC: Recent Labs  Lab 12/31/17 1524 01/02/18 0434 01/03/18 0923 01/04/18 0514  WBC 6.1 6.7 6.0 5.7  HGB 10.5* 10.2* 10.1* 9.8*  HCT 34.5* 34.4* 32.9* 32.3*  MCV 107.1* 105.5* 106.5* 106.6*  PLT 239 247 236 221    Cardiac Enzymes: Recent Labs  Lab 01/01/18 0353 01/01/18 0944  TROPONINI <0.03 <0.03    BNP: BNP (last 3 results) Recent Labs    11/09/17 1213 11/18/17 0454 12/31/17 1524  BNP 316.0* 191.3* 408.6*    ProBNP (last 3 results) No results for input(s): PROBNP in the last 8760 hours.   CBG: No results for input(s): GLUCAP in the last 168 hours.  Coagulation Studies: No results for input(s): LABPROT, INR in the last 72 hours.   Imaging    No results found.   Medications:     Current Medications: . apixaban  5 mg Oral BID  . aspirin EC  81 mg Oral Daily  . cephALEXin  500 mg Oral Q6H  . cyanocobalamin  1,000 mcg Intramuscular Daily  . ferrous sulfate  325 mg Oral BID WC  . furosemide  80 mg Intravenous TID  . levothyroxine  175 mcg Oral QAC breakfast  . magnesium oxide  400 mg Oral Daily  . metoprolol tartrate  12.5 mg Oral BID  . pantoprazole  40 mg Oral Daily  . potassium  chloride  40 mEq Oral BID  . potassium chloride  40 mEq Oral Once  . sodium chloride flush  3 mL Intravenous Q12H     Infusions: . 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  Admitted with marked volume overload.   Assessment/Plan   1. A/C Diastolic Heart Failure -ECHO 01/01/2018 EF 65-70%. Marked volume overload. Weight  up >20 pounds from recent discharge (discharge weight 257 pounds)  -She has had sluggish diuresis with IV lasix 80 mg twice a day.  - Lasix increased to 80 mg three times a day this morning. Will start 2.5 mg metolazone.  -Renal function stable. K low. Add 12.5 mg spiro daily.  -Will need RHC once fully diuresed.  2. Suspected OSA -Sleep study completed 12/29/2017   3. Morbid Obesity  Body mass index is 46.91 kg/m. Needs to lose weight.   4. H/O DVT/PE On eliquis   5. H/O of Congenital Bicuspid Valve w/ Severe Stenosis: s/p bioprosthetic aortic valve replacement with aortic root placement, per Dr. Cornelius Moras June 2018.   6. Hypokalemia.  K low. Supp K. Add spiro 12.5 mg daily. Check BMET in am .    Medication concerns reviewed with patient and pharmacy team. Barriers identified: last admit she was noncompliant with medications. May need HF Paramedicine when she is discharged.   Length of Stay: 4  [name] Clegg, NP  01/04/2018, 10:32 AM  Advanced Heart Failure Team Pager [phone] (M-F; 7a - 4p)  Please contact [facility] for night-coverage after hours (4p -7a ) and weekends on [email or web address]  Patient seen and examined with Tonye Becket, NP. We discussed all aspects of the encounter. I agree with the assessment and plan as stated above.   62 y/o woman as above with marked obesity, bicuspid AoV s/p replacement, CKD and h/o DVT/PE.  Now presents with recurrent R>>L HF with nearly 30 pound weight gain in setting of dietary indiscretion. She seems to be responding to IV lasix but weight unchanged. I have reviewed echo and LV is normal but RV is dilated and there is severe TR. Her PA  Pressures aren't that high on echo but I suspect they may be underestimated. She recently has a PSG which was negative for OSA (results reviewed personally). 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 Will plan to increase diuresis and once euvolemic will plan RHC to further assess. Will also likely need VQ at some point. We discussed the need for weight loss and to limit fluids. She will need Paramedicine to follow on discharge.   Arvilla Meres, MD  10:44 PM

## 2018-01-05 ENCOUNTER — Inpatient Hospital Stay (HOSPITAL_COMMUNITY): Payer: Medicare Other

## 2018-01-05 DIAGNOSIS — I50810 Right heart failure, unspecified: Secondary | ICD-10-CM

## 2018-01-05 LAB — FOLATE: FOLATE: 6.8 ng/mL (ref >5.9)

## 2018-01-05 LAB — CBC
HCT: 33.6 % — ABNORMAL LOW (ref 36.0–46.0)
Hemoglobin: 10.2 g/dL — ABNORMAL LOW (ref 12.0–15.0)
MCH: 32.3 pg (ref 26.0–34.0)
MCHC: 30.4 g/dL (ref 30.0–36.0)
MCV: 106.3 fL — ABNORMAL HIGH (ref 78.0–100.0)
PLATELETS: 232 10*3/uL (ref 150–400)
RBC: 3.16 MIL/uL — ABNORMAL LOW (ref 3.87–5.11)
RDW: 16.9 % — ABNORMAL HIGH (ref 11.5–15.5)
WBC: 5.7 10*3/uL (ref 4.0–10.5)

## 2018-01-05 LAB — BASIC METABOLIC PANEL
Anion gap: 10 (ref 5–15)
BUN: 21 mg/dL — AB (ref 6–20)
CO2: 29 mmol/L (ref 22–32)
CREATININE: 1.04 mg/dL — AB (ref 0.44–1.00)
Calcium: 8.8 mg/dL — ABNORMAL LOW (ref 8.9–10.3)
Chloride: 100 mmol/L — ABNORMAL LOW (ref 101–111)
GFR calc Af Amer: >60 mL/min (ref >60)
GFR, EST NON AFRICAN AMERICAN: 56 mL/min — AB (ref >60)
Glucose, Bld: 98 mg/dL (ref 65–99)
POTASSIUM: 3 mmol/L — AB (ref 3.5–5.1)
SODIUM: 139 mmol/L (ref 135–145)

## 2018-01-05 LAB — VITAMIN B12

## 2018-01-05 LAB — IRON AND TIBC
IRON: 31 ug/dL (ref 28–170)
Saturation Ratios: 8 % — ABNORMAL LOW (ref 10.4–31.8)
TIBC: 406 ug/dL (ref 250–450)
UIBC: 375 ug/dL

## 2018-01-05 LAB — RETICULOCYTES
RBC.: 3.16 MIL/uL — ABNORMAL LOW (ref 3.87–5.11)
Retic Count, Absolute: 123.2 10*3/uL (ref 19.0–186.0)
Retic Ct Pct: 3.9 % — ABNORMAL HIGH (ref 0.4–3.1)

## 2018-01-05 LAB — MAGNESIUM: MAGNESIUM: 2.2 mg/dL (ref 1.7–2.4)

## 2018-01-05 LAB — FERRITIN: Ferritin: 37 ng/mL (ref 11–307)

## 2018-01-05 MED ORDER — SODIUM CHLORIDE 0.9 % IV SOLN
510.0000 mg | Freq: Once | INTRAVENOUS | Status: AC
  Administered 2018-01-05: 510 mg via INTRAVENOUS
  Filled 2018-01-05: qty 17

## 2018-01-05 MED ORDER — POTASSIUM CHLORIDE CRYS ER 20 MEQ PO TBCR: 40.0000 meq | EXTENDED_RELEASE_TABLET | Freq: Three times a day (TID) | ORAL | Status: DC

## 2018-01-05 MED ORDER — DICLOFENAC SODIUM 1 % TD GEL
2.0000 g | Freq: Four times a day (QID) | TRANSDERMAL | Status: DC
  Administered 2018-01-05 – 2018-01-14 (×21): 2 g via TOPICAL
  Filled 2018-01-05: qty 100

## 2018-01-05 MED ORDER — POTASSIUM CHLORIDE CRYS ER 20 MEQ PO TBCR
40.0000 meq | EXTENDED_RELEASE_TABLET | Freq: Three times a day (TID) | ORAL | Status: DC
  Administered 2018-01-05 – 2018-01-07 (×9): 40 meq via ORAL
  Filled 2018-01-05 (×10): qty 2

## 2018-01-05 MED ORDER — FUROSEMIDE 10 MG/ML IJ SOLN
80.0000 mg | Freq: Three times a day (TID) | INTRAMUSCULAR | Status: DC
  Administered 2018-01-05 – 2018-01-12 (×20): 80 mg via INTRAVENOUS
  Filled 2018-01-05 (×21): qty 8

## 2018-01-05 MED ORDER — SPIRONOLACTONE 25 MG PO TABS
25.0000 mg | ORAL_TABLET | Freq: Every day | ORAL | Status: DC
  Administered 2018-01-05 – 2018-01-14 (×10): 25 mg via ORAL
  Filled 2018-01-05 (×10): qty 1

## 2018-01-05 NOTE — Care Management Important Message (Signed)
Important Message  Patient Details  Name: Christine PattenJudith M Pevehouse MRN: [id number] Date of Birth: 01/06/1956   Medicare Important Message Given:  Yes    Oralia RudMegan P Eliyahu Bille 01/05/2018, 3:44 PM

## 2018-01-05 NOTE — Progress Notes (Signed)
Physical Therapy Treatment Patient Details Name: Christine PattenJudith M Hicks MRN: [id number] DOB: 12/09/1955 Today's Date: 01/05/2018    History of Present Illness Pt is a 62 y.o. female admitted 12/31/17 with c/o SOB and LE edema; worked up for CHF. PMHx: multiple falls, morbid obesity, HTN, dCHF, DVT on Eliquis, GERD, CKD-2, aortic stenosis, previous aortic root replacement and valve replacement (2018), iron deficiency anemia.   PT Comments    Pt progressing with mobility. Amb 200' with RW and supervision for safety; required 1x standing rest break secondary to fatigue. Recommend continued HHPT services upon return home. Will continue to follow acutely.  Follow Up Recommendations  Home health PT;Supervision/Assistance - 24 hour     Equipment Recommendations  None recommended by PT    Recommendations for Other Services       Precautions / Restrictions Precautions Precautions: Fall Restrictions Weight Bearing Restrictions: No    Mobility  Bed Mobility Overal bed mobility: Needs Assistance Bed Mobility: Supine to Sit     Supine to sit: Supervision;HOB elevated        Transfers Overall transfer level: Needs assistance Equipment used: Rolling walker (2 wheeled) Transfers: Sit to/from Stand Sit to Stand: Supervision         General transfer comment: Increased time and effort  Ambulation/Gait Ambulation/Gait assistance: Supervision Ambulation Distance (Feet): 200 Feet Assistive device: Rolling walker (2 wheeled) Gait Pattern/deviations: Decreased stride length;Step-through pattern;Wide base of support Gait velocity: Decreased Gait velocity interpretation: <1.8 ft/sec, indicate of risk for recurrent falls General Gait Details: Slow, steady amb with RW and supervision for safety. 1x standing rest break secondary to fatigue   Stairs             Wheelchair Mobility    Modified Rankin (Stroke Patients Only)       Balance Overall balance assessment: Mild deficits  observed, not formally tested                                          Cognition Arousal/Alertness: Awake/alert Behavior During Therapy: WFL for tasks assessed/performed Overall Cognitive Status: Within Functional Limits for tasks assessed                                        Exercises      General Comments General comments (skin integrity, edema, etc.): BLE edema      Pertinent Vitals/Pain Pain Assessment: No/denies pain    Home Living                      Prior Function            PT Goals (current goals can now be found in the care plan section) Acute Rehab PT Goals Patient Stated Goal: to get strength back and get back to normal  PT Goal Formulation: With patient Time For Goal Achievement: 01/15/18 Potential to Achieve Goals: Good Progress towards PT goals: Progressing toward goals    Frequency    Min 3X/week      PT Plan Current plan remains appropriate    Co-evaluation              AM-PAC PT "6 Clicks" Daily Activity  Outcome Measure  Difficulty turning over in bed (including adjusting bedclothes, sheets and blankets)?: A Little Difficulty moving from lying  on back to sitting on the side of the bed? : A Little Difficulty sitting down on and standing up from a chair with arms (e.g., wheelchair, bedside commode, etc,.)?: A Little Help needed moving to and from a bed to chair (including a wheelchair)?: A Little Help needed walking in hospital room?: A Little Help needed climbing 3-5 steps with a railing? : A Lot 6 Click Score: 17    End of Session Equipment Utilized During Treatment: Gait belt Activity Tolerance: Patient tolerated treatment well Patient left: in chair;with call bell/phone within reach Nurse Communication: Mobility status PT Visit Diagnosis: Unsteadiness on feet (R26.81);Other abnormalities of gait and mobility (R26.89);History of falling (Z91.81)     Time: 2956-2130 PT Time  Calculation (min) (ACUTE ONLY): 28 min  Charges:  $Gait Training: 8-22 mins $Therapeutic Activity: 8-22 mins                    G Codes:      Ina Homes, PT, DPT Acute Rehab Services  Pager: [phone]  Malachy Chamber 01/05/2018, 11:26 AM

## 2018-01-05 NOTE — Progress Notes (Signed)
Pt k 3.0, paged Dr Lowell GuitarPowell with results

## 2018-01-05 NOTE — Progress Notes (Addendum)
Advanced Heart Failure Rounding Note  PCP-Cardiologist: Lyn Records III, MD   Subjective:    Brisk UOP with 80 mg IV lasix TID + metolazone. -2.4 L. Weight is down 3 lbs overnight. Creatinine stable 1.04. K 3.0.  Denies CP. SOB much improved. Denies orthopnea or PND. No dizziness.  Walked in hallways last night.    Objective:   Weight Range: 278 lb 8 oz (126.3 kg) Body mass index is 46.34 kg/m.   Vital Signs:   Temp:  [97.7 F (36.5 C)-97.9 F (36.6 C)] 97.8 F (36.6 C) (06/11 0601) Pulse Rate:  [73-94] 94 (06/11 0601) Resp:  [17-20] 17 (06/11 0601) BP: (98-109)/(52-64) 109/55 (06/11 0601) SpO2:  [92 %-100 %] 92 % (06/11 0601) Weight:  [278 lb 8 oz (126.3 kg)] 278 lb 8 oz (126.3 kg) (06/11 0601) Last BM Date: 01/04/18  Weight change: Filed Weights   01/03/18 0549 01/04/18 0300 01/05/18 0601  Weight: 280 lb 11.2 oz (127.3 kg) 281 lb 14.4 oz (127.9 kg) 278 lb 8 oz (126.3 kg)    Intake/Output:   Intake/Output Summary (Last 24 hours) at 01/05/2018 0742 Last data filed at 01/05/2018 0500 Gross per 24 hour  Intake 1323 ml  Output 3750 ml  Net -2427 ml      Physical Exam    General:  Lying flat in bed. No resp difficulty HEENT: Normal anicteric Neck: Supple. JVP to ear. Carotids 2+ bilat; no bruits. No lymphadenopathy or thyromegaly appreciated. Cor: PMI nondisplaced. Regular rate & rhythm. 2/6 SEM LUSB pronounced P2 Lungs: Clear no wheeze Abdomen: Obese Soft, nontender, nondistended. No hepatosplenomegaly. No bruits or masses. Good bowel sounds. Extremities: No cyanosis, clubbing, rash, BLE 2+ edema Neuro: alert & oriented x 3, cranial nerves grossly intact. moves all 4 extremities w/o difficulty. Affect pleasant   Telemetry   NSR 70s. Personally reviewed.   EKG    No new tracings.   Labs    CBC Recent Labs    01/04/18 0514 01/05/18 0624  WBC 5.7 5.7  HGB 9.8* 10.2*  HCT 32.3* 33.6*  MCV 106.6* 106.3*  PLT 221 232   Basic Metabolic  Panel Recent Labs    01/04/18 0514 01/05/18 0624  NA 139 139  K 3.1* 3.0*  CL 104 100*  CO2 25 29  GLUCOSE 134* 98  BUN 23* 21*  CREATININE 1.19* 1.04*  CALCIUM 8.4* 8.8*  MG 2.2 2.2   Liver Function Tests Recent Labs    01/03/18 0634  AST 17  ALT 11*  ALKPHOS 76  BILITOT 1.1  PROT 6.1*  ALBUMIN 2.6*   No results for input(s): LIPASE, AMYLASE in the last 72 hours. Cardiac Enzymes No results for input(s): CKTOTAL, CKMB, CKMBINDEX, TROPONINI in the last 72 hours.  BNP: BNP (last 3 results) Recent Labs    11/09/17 1213 11/18/17 0454 12/31/17 1524  BNP 316.0* 191.3* 408.6*    ProBNP (last 3 results) No results for input(s): PROBNP in the last 8760 hours.   D-Dimer No results for input(s): DDIMER in the last 72 hours. Hemoglobin A1C No results for input(s): HGBA1C in the last 72 hours. Fasting Lipid Panel No results for input(s): CHOL, HDL, LDLCALC, TRIG, CHOLHDL, LDLDIRECT in the last 72 hours. Thyroid Function Tests No results for input(s): TSH, T4TOTAL, T3FREE, THYROIDAB in the last 72 hours.  Invalid input(s): FREET3  Other results:   Imaging     No results found.   Medications:     Scheduled Medications: . apixaban  5 mg Oral BID  . aspirin EC  81 mg Oral Daily  . cephALEXin  500 mg Oral Q6H  . cyanocobalamin  1,000 mcg Intramuscular Daily  . ferrous sulfate  325 mg Oral BID WC  . furosemide  80 mg Intravenous TID  . levothyroxine  175 mcg Oral QAC breakfast  . magnesium oxide  400 mg Oral Daily  . metolazone  2.5 mg Oral Daily  . metoprolol tartrate  12.5 mg Oral BID  . pantoprazole  40 mg Oral Daily  . potassium chloride  40 mEq Oral BID  . sodium chloride flush  3 mL Intravenous Q12H  . spironolactone  12.5 mg Oral Daily     Infusions: . 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  Admitted with marked volume overload.   Assessment/Plan   1. A/C Diastolic Heart Failure -ECHO 01/01/2018 EF 65-70%. Marked volume overload. Weight  up >20 pounds from recent discharge (discharge weight 257 pounds)  - Volume status remains elevated - Continue 80 mg IV lasix TID + 2.5 mg metolazone daily - Increase spiro to 25 mg daily. K 3.0 - Will need RHC once fully diuresed.    2. Suspected OSA - Sleep study completed 12/29/2017. No change  3. Morbid Obesity  - Body mass index is 46.34 kg/m. Needs to lose weight.   4. H/O DVT/PE - On eliquis. No change.   5. H/O of Congenital Bicuspid Valve w/ Severe Stenosis: s/p bioprosthetic aortic valve replacement with aortic root placement, per Dr. Cornelius Moraswen June 2018. No change  6. Hypokalemia.  - 3.0. Increase spiro to 25 mg daily.  - K supplemented by primary team.   Medication concerns reviewed with patient and pharmacy team. Barriers identified: last admit she was noncompliant with medications. May need HF Paramedicine when she is discharged.   Length of Stay: 5  Alford HighlandAshley M Smith, NP  01/05/2018, 7:42 AM  Advanced Heart Failure Team Pager [phone] (M-F; 7a - 4p)  Please contact [facility] for night-coverage after hours (4p -7a ) and weekends on [email or web address]  Patient seen and examined with the above-signed Advanced Practice Provider and/or Housestaff. I personally reviewed laboratory data, imaging studies and relevant notes. I independently examined the patient and formulated the important aspects of the plan. I have edited the note to reflect any of my changes or salient points. I have personally discussed the plan with the patient and/or family.  She is diuresing well but still markedly volume  overloaded. Renal function stable. Continue IV diuresis. Will supp K. Increase spiro. I reviewed echo findings with her again. 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 Will plan RHC when more fully diuresed. Continue to ambulate. Stressed need for fluid restriction and weight loss.   Arvilla Meresaniel Maureena Dabbs, MD  8:18 AM

## 2018-01-05 NOTE — Progress Notes (Addendum)
PROGRESS NOTE    Christine Hicks  ZOX:[id number] DOB: Jun 27, 1956 DOA: 12/31/2017 PCP: Annita Brod, MD   Brief Narrative:   Christine Hicks is Christine Hicks 62 y.o. female with medical history significant of morbid obesity, hypertension, dCHF, DVT on Eliquis, GERD, CKD-2, aortic stenosis, s/p of aortic root replacement and valve replacement, iron deficiency anemia, who presents with shortness breath and leg edema.  Patient states that she has shortness of breath in the past several days, which has been progressively getting worse.  She does not have chest pain. She has dry cough.  She also noted worsening bilateral leg edema.  Her primary care doctor increased her torsemide dose to 60 mg twice Shekira Drummer day without significant improvement.  Patient states that she has gained weight by approximately 20 pounds since April.  She has generalized weakness.  She states that she fell twice accidentally without significant injury.  She has bruise on the right arm.  No unilateral weakness, numbness or tingling his extremities.  No facial droop or slurred speech.  Denies nausea, vomiting, diarrhea, abdominal pain, symptoms of UTI.  Assessment & Plan:   Principal Problem:   Acute on chronic diastolic heart failure (HCC) Active Problems:   Essential hypertension   DVT, recurrent, lower extremity, chronic, bilateral (HCC)   Iron deficiency anemia   GERD (gastroesophageal reflux disease)   Acute renal failure superimposed on stage 2 chronic kidney disease (HCC)   Acute on chronic diastolic heart failure [facility]): Patient has elevated BNP 408, lateral leg edema, pulmonary vascular congestion on chest x-ray, consistent with CHF exacerbation. - On torsemide 40 BID as outpatient, increased to 60 BID prior to coming in for admission - She notes she feels good around 255 lbs (weight day prior to discharge last admission was ~257) - 282 lbs on hospital day 1 - lasix to 80 mg TID.  Metolazone 2.5 mg daily.  Spironolactone 25 mg  daily. - cardiology consult, appreciate recommendations - suspect PAH/RV failure 2/2 OHS (of note, she recently had sleep study for OSA on 6/4, she notes she was told these results were negative - Planning for RHC when more fully diuresed) - 2d echo (EF 65-70%, severe TV regurgitation, increased PASP - see report) - will continue home metoprolol, ASA - Daily weights - strict I/O's - Low salt diet - Good UOP, weight downtrending  Wt Readings from Last 3 Encounters:  01/05/18 126.3 kg (278 lb 8 oz)  12/29/17 119.3 kg (263 lb)  12/01/17 120.6 kg (265 lb 12.8 oz)   HTN:  - Metoprolol (decrease to 12.5 mg BID) - spironolactone added  Bradycardia: sinus brady noted on tele, will decrease metop, continue to monitor  Falls: sounds like she had 2 mechanical falls (1 where she slipped and 1 where grab bar broke).  On exam, symmetric strength and no focal deficits.  Will diurese as noted above.  PT consult.  Negative head CT. - home health PT recommended as well as 24 hour supervision/assistance  Left Foot Pain: unclear etiology, she did fall sas noted above, but seems like it started in hospital.  Follow plain films, ctm.  Voltaren.  Tremor: describes tremor in bilateral hands (but worse on L) for past few weeks.  Worse with activity.  TSH wnl.   - continue to monitor, follow up outpatient  Vitamin B12 deficiency: Vitamin B12 IM x 7 days (ordered). then will need weekly x 4 weeks.  After that will need monthly.  Follow folate (wnl).   Hypothyroidism: synthroid  DVT, recurrent, lower extremity, chronic, bilateral (HCC): -on eliquis   Right greater than Left Lower Extremity Edema: likely due to above, follow up US (negative for DVT, but limited study).    Cellulitis vs venous stasis changes: RLE with redness which seemed Naasia Weilbacher bit worse 6/9.  Stable 6/10 and 6/11.  At this point, with lack of change with abx and pt afebrile and with normal abx, suspect this may be chronic changes from edema  or venous stasis.  Will stop abx.  Iron deficiency anemia: -Continue iron supplement  GERD: -Protonix  Acute renal failure superimposed on stage 2 chronic kidney disease (HCC): Baseline creatinine 1.15 on 12/01/2017.  Creatinine peaked to 1.56, continues to improve with diuresis. - Follow urinalysis (no protein or RBC's) - follow renal function while diuresing - further workup as indicated  Hypokalemia: replete, follow mag.  Stable, follow.  TID potassium, spiron increased.  Prolonged QT: replete K and follow mag.  Limit QT prolonging meds.   DVT prophylaxis: home eliquis Code Status: full  Family Communication: son and husband at bedside. Disposition Plan: pending improvement   Consultants:   none  Procedures:   none  Antimicrobials:   none    Subjective: L foot pain. Feeling better.  Objective: Vitals:   01/04/18 2050 01/05/18 0601 01/05/18 1029 01/05/18 1122  BP: (!) 105/52 (!) 109/55 103/66 (!) 119/92  Pulse: 85 94 78 79  Resp: 17 17    Temp: 97.7 F (36.5 C) 97.8 F (36.6 C)  97.6 F (36.4 C)  TempSrc: Oral Oral  Oral  SpO2: 100% 92%  100%  Weight:  126.3 kg (278 lb 8 oz)    Height:        Intake/Output Summary (Last 24 hours) at 01/05/2018 1224 Last data filed at 01/05/2018 1120 Gross per 24 hour  Intake 1443 ml  Output 4650 ml  Net -3207 ml   Filed Weights   01/03/18 0549 01/04/18 0300 01/05/18 0601  Weight: 127.3 kg (280 lb 11.2 oz) 127.9 kg (281 lb 14.4 oz) 126.3 kg (278 lb 8 oz)    Examination:  General: No acute distress. Cardiovascular: Heart sounds show [name] regular rate, and rhythm. No gallops or rubs. No murmurs. No JVD. Lungs: Clear to auscultation bilaterally with good air movement. No rales, rhonchi or wheezes. Abdomen: Soft, nontender, nondistended with normal active bowel sounds. No masses. No hepatosplenomegaly. Neurological: Alert and oriented 3. Moves all extremities 4. Cranial nerves II through XII grossly intact. Skin:  Warm and dry. No rashes or lesions. Extremities: R>L LEE.  Mild erythema to R leg.  TTP of L foot. Psychiatric: Mood and affect are normal. Insight and judgment are appropriate.  Data Reviewed: I have personally reviewed following labs and imaging studies  CBC: Recent Labs  Lab 12/31/17 1524 01/02/18 0434 01/03/18 0923 01/04/18 0514 01/05/18 0624  WBC 6.1 6.7 6.0 5.7 5.7  HGB 10.5* 10.2* 10.1* 9.8* 10.2*  HCT 34.5* 34.4* 32.9* 32.3* 33.6*  MCV 107.1* 105.5* 106.5* 106.6* 106.3*  PLT 239 247 236 221 232   Basic Metabolic Panel: Recent Labs  Lab 01/01/18 0353 01/02/18 0434 01/03/18 0634 01/04/18 0514 01/05/18 0624  NA 142 141 142 139 139  K 3.2* 4.0 3.4* 3.1* 3.0*  CL 108 106 105 104 100*  CO2 24 26 26 25 29   GLUCOSE 101* 102* 80 134* 98  BUN 29* 29* 26* 23* 21*  CREATININE 1.56* 1.43* 1.35* 1.19* 1.04*  CALCIUM 8.6* 8.7* 8.6* 8.4* 8.8*  MG 2.4  2.5* 2.3 2.2 2.2   GFR: Estimated Creatinine Clearance: 74 mL/min (Asya Derryberry) (by C-G formula based on SCr of 1.04 mg/dL (H)). Liver Function Tests: Recent Labs  Lab 01/03/18 0634  AST 17  ALT 11*  ALKPHOS 76  BILITOT 1.1  PROT 6.1*  ALBUMIN 2.6*   No results for input(s): LIPASE, AMYLASE in the last 168 hours. No results for input(s): AMMONIA in the last 168 hours. Coagulation Profile: No results for input(s): INR, PROTIME in the last 168 hours. Cardiac Enzymes: Recent Labs  Lab 01/01/18 0353 01/01/18 0944  TROPONINI <0.03 <0.03   BNP (last 3 results) No results for input(s): PROBNP in the last 8760 hours. HbA1C: No results for input(s): HGBA1C in the last 72 hours. CBG: No results for input(s): GLUCAP in the last 168 hours. Lipid Profile: No results for input(s): CHOL, HDL, LDLCALC, TRIG, CHOLHDL, LDLDIRECT in the last 72 hours. Thyroid Function Tests: No results for input(s): TSH, T4TOTAL, FREET4, T3FREE, THYROIDAB in the last 72 hours. Anemia Panel: Recent Labs    01/05/18 0624  VITAMINB12 >7,500*  FOLATE  6.8  FERRITIN 37  TIBC 406  IRON 31  RETICCTPCT 3.9*   Sepsis Labs: No results for input(s): PROCALCITON, LATICACIDVEN in the last 168 hours.  No results found for this or any previous visit (from the past 240 hour(s)).       Radiology Studies: No results found.      Scheduled Meds: . apixaban  5 mg Oral BID  . aspirin EC  81 mg Oral Daily  . cephALEXin  500 mg Oral Q6H  . cyanocobalamin  1,000 mcg Intramuscular Daily  . ferrous sulfate  325 mg Oral BID WC  . furosemide  80 mg Intravenous TID  . levothyroxine  175 mcg Oral QAC breakfast  . magnesium oxide  400 mg Oral Daily  . metolazone  2.5 mg Oral Daily  . metoprolol tartrate  12.5 mg Oral BID  . pantoprazole  40 mg Oral Daily  . potassium chloride  40 mEq Oral TID  . sodium chloride flush  3 mL Intravenous Q12H  . spironolactone  25 mg Oral Daily   Continuous Infusions: . sodium chloride       LOS: 5 days    Time spent: over 30 min    Lacretia Nicks, MD [facility] Pager [phone]  If 7PM-7AM, please contact night-coverage [email or web address] Password [facility] 01/05/2018, 12:24 PM

## 2018-01-05 NOTE — Progress Notes (Signed)
   Appreciate advanced heart failure team recommendations and management.

## 2018-01-05 NOTE — Progress Notes (Signed)
Occupational Therapy Treatment Patient Details Name: Archie PattenJudith M Killough MRN: [id number] DOB: 09/03/1955 Today's Date: 01/05/2018    History of present illness Pt is a 62 y.o. female admitted 12/31/17 with c/o SOB and LE edema; worked up for CHF. PMHx: multiple falls, morbid obesity, HTN, dCHF, DVT on Eliquis, GERD, CKD-2, aortic stenosis, previous aortic root replacement and valve replacement (2018), iron deficiency anemia.   OT comments  Pt is demonstrates improving activity tolerance as needed for ADLs.  She is able to perform ADLs with min A - as family has been assisting with LB ADLs for some time, and are very eager to assist her.  She does have AE, but chooses to have family's assistance.  Will continue to follow.   Follow Up Recommendations  No OT follow up;Supervision - Intermittent    Equipment Recommendations  None recommended by OT    Recommendations for Other Services      Precautions / Restrictions Precautions Precautions: Fall Restrictions Weight Bearing Restrictions: No       Mobility Bed Mobility Overal bed mobility: Needs Assistance Bed Mobility: Supine to Sit     Supine to sit: Supervision;HOB elevated     General bed mobility comments: Pt sitting up in chair   Transfers Overall transfer level: Needs assistance Equipment used: Rolling walker (2 wheeled) Transfers: Sit to/from [employer] CorporationStand;Stand Pivot Transfers Sit to Stand: Supervision Stand pivot transfers: Min guard       General transfer comment: son attempted to provide assistance, however, pt did not require it     Balance Overall balance assessment: Mild deficits observed, not formally tested                                         ADL either performed or assessed with clinical judgement   ADL Overall ADL's : Needs assistance/impaired     Grooming: Wash/dry hands;Wash/dry face;Oral care;Brushing hair;Supervision/safety;Standing                   Toilet Transfer:  Supervision/safety;Ambulation;Comfort height toilet;Grab bars;RW   Toileting- Clothing Manipulation and Hygiene: Sit to/from stand;Supervision/safety       Functional mobility during ADLs: Supervision/safety;Min guard;Rolling walker General ADL Comments: family very supportive and quickly provide assist, often when pt doesn't need it      Vision       Perception     Praxis      Cognition Arousal/Alertness: Awake/alert Behavior During Therapy: WFL for tasks assessed/performed Overall Cognitive Status: Within Functional Limits for tasks assessed                                          Exercises     Shoulder Instructions       General Comments bil LE edema     Pertinent Vitals/ Pain       Pain Assessment: No/denies pain  Home Living                                          Prior Functioning/Environment              Frequency  Min 2X/week        Progress Toward Goals  OT Goals(current goals  can now be found in the care plan section)  Progress towards OT goals: Not progressing toward goals - comment  Acute Rehab OT Goals Patient Stated Goal: to get strength back and get back to normal   Plan Discharge plan remains appropriate    Co-evaluation                 AM-PAC PT "6 Clicks" Daily Activity     Outcome Measure   Help from another person eating meals?: None Help from another person taking care of personal grooming?: A Little Help from another person toileting, which includes using toliet, bedpan, or urinal?: A Little Help from another person bathing (including washing, rinsing, drying)?: A Little Help from another person to put on and taking off regular upper body clothing?: A Little Help from another person to put on and taking off regular lower body clothing?: A Little 6 Click Score: 19    End of Session Equipment Utilized During Treatment: Gait belt;Rolling walker  OT Visit Diagnosis: Unsteadiness  on feet (R26.81)   Activity Tolerance Patient tolerated treatment well   Patient Left in chair;with call bell/phone within reach;with family/visitor present   Nurse Communication Mobility status        Time: 1610-9604 OT Time Calculation (min): 25 min  Charges: OT General Charges $OT Visit: 1 Visit OT Treatments $Therapeutic Activity: 23-37 mins  [employer], OTR/L [phone]    Jeani Hawking M 01/05/2018, 3:14 PM

## 2018-01-06 ENCOUNTER — Ambulatory Visit: Payer: Self-pay | Admitting: Nurse Practitioner

## 2018-01-06 LAB — BASIC METABOLIC PANEL
Anion gap: 11 (ref 5–15)
Anion gap: 10 (ref 5–15)
BUN: 23 mg/dL — AB (ref 6–20)
BUN: 22 mg/dL — AB (ref 6–20)
CALCIUM: 8.9 mg/dL (ref 8.9–10.3)
CALCIUM: 8.6 mg/dL — AB (ref 8.9–10.3)
CO2: 30 mmol/L (ref 22–32)
CO2: 30 mmol/L (ref 22–32)
CREATININE: 1.13 mg/dL — AB (ref 0.44–1.00)
CREATININE: 1.18 mg/dL — AB (ref 0.44–1.00)
Chloride: 97 mmol/L — ABNORMAL LOW (ref 101–111)
Chloride: 95 mmol/L — ABNORMAL LOW (ref 101–111)
GFR calc Af Amer: 59 mL/min — ABNORMAL LOW (ref >60)
GFR calc Af Amer: 56 mL/min — ABNORMAL LOW (ref >60)
GFR, EST NON AFRICAN AMERICAN: 51 mL/min — AB (ref >60)
GFR, EST NON AFRICAN AMERICAN: 48 mL/min — AB (ref >60)
GLUCOSE: 101 mg/dL — AB (ref 65–99)
GLUCOSE: 128 mg/dL — AB (ref 65–99)
POTASSIUM: 2.8 mmol/L — AB (ref 3.5–5.1)
POTASSIUM: 3.2 mmol/L — AB (ref 3.5–5.1)
SODIUM: 135 mmol/L (ref 135–145)
Sodium: 138 mmol/L (ref 135–145)

## 2018-01-06 LAB — MAGNESIUM: MAGNESIUM: 2.1 mg/dL (ref 1.7–2.4)

## 2018-01-06 LAB — CBC
HCT: 31.2 % — ABNORMAL LOW (ref 36.0–46.0)
HEMOGLOBIN: 9.5 g/dL — AB (ref 12.0–15.0)
MCH: 31.7 pg (ref 26.0–34.0)
MCHC: 30.4 g/dL (ref 30.0–36.0)
MCV: 104 fL — ABNORMAL HIGH (ref 78.0–100.0)
PLATELETS: 231 10*3/uL (ref 150–400)
RBC: 3 MIL/uL — ABNORMAL LOW (ref 3.87–5.11)
RDW: 16.3 % — ABNORMAL HIGH (ref 11.5–15.5)
WBC: 4.8 10*3/uL (ref 4.0–10.5)

## 2018-01-06 MED ORDER — POTASSIUM CHLORIDE 10 MEQ/100ML IV SOLN
10.0000 meq | INTRAVENOUS | Status: AC
  Administered 2018-01-06 (×4): 10 meq via INTRAVENOUS
  Filled 2018-01-06 (×4): qty 100

## 2018-01-06 NOTE — Progress Notes (Signed)
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  Admitted with marked volume overload.  Past Medical History:  Diagnosis Date  . Acute on chronic diastolic congestive heart failure (HCC)   . Anemia, iron deficiency    negative egd/colonoscopy 11/16/2015  . Aortic stenosis, severe   . Arthritis   . Bicuspid aortic valve   . DVT (deep venous thrombosis) (HCC)    RLE DVT 11/12/15  . GERD (gastroesophageal reflux disease)   . Heart murmur   . Hypertension   . Insomnia   . Morbid obesity with BMI of 45.0-49.9, adult (HCC)   . PONV (postoperative nausea and vomiting)    took a long time to wake up  . 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 Thyroid disease   . Wears glasses   . Wears partial dentures     Social History   Socioeconomic History  . Marital status: Significant Other    Spouse name: Not on file  . Number of children: Not on file  . Years of education: Not on file  . Highest education level: Not on file  Occupational History  . Occupation: retired  Engineer, productionocial Needs  . Financial resource strain: Not on file  . Food insecurity:    Worry: Not on file    Inability: Not on file  . Transportation needs:    Medical: Not on file    Non-medical: Not on file  Tobacco Use  . Smoking status: Former Smoker    Types: Cigarettes  . Smokeless tobacco: Never Used  . Tobacco comment: quit smoking cigarettes > 25 years ago  Substance and Sexual Activity  . Alcohol use: No  . Drug use: No  . Sexual activity: Yes   Lifestyle  . Physical activity:    Days per week: Not on file    Minutes per session: Not on file  . Stress: Not on file  Relationships  . Social connections:    Talks on phone: Not on file    Gets together: Not on file    Attends religious service: Not on file    Active member of club or organization: Not on file    Attends meetings of clubs or organizations: Not on file    Relationship status: Not on file  Other Topics Concern  . Not on file  Social History Narrative  . Not on file    ECHO:Study Conclusions:  01/01/18  - Left ventricle: The cavity size was normal. Systolic function was   vigorous. The estimated ejection fraction was in the range of 65%   to 70%. Wall motion was normal; there were no regional wall   motion abnormalities. There was no evidence of elevated   ventricular filling pressure by Doppler parameters. - Right atrium: The atrium was severely dilated. - Tricuspid valve: There was severe regurgitation. - Pulmonary arteries: Systolic pressure was mildly increased. PA   peak pressure: 39 mm Hg (S).  ------------------------------------------------------------------- Study data:  Comparison was made to the study of 11/10/2017.  Study status:  Routine.  Procedure:  The patient reported no pain  pre or post test. Transthoracic echocardiography. Image quality was adequate. The study was technically difficult, as a result of body habitus.          Transthoracic echocardiography.  M-mode, limited 2D, limited spectral Doppler, and color Doppler.  Birthdate: Patient birthdate: 07/04/1956.  Age:  Patient is 62 yr old.  Sex: Gender: female.    BMI: 47 kg/m^2.  Blood pressure:     100/61 Patient status:  Inpatient.  Study date:  Study date: 01/01/2018. Study time: 02:11 PM.  Location:  Echo laboratory.  BNP    Component Value Date/Time   BNP 408.6 (H) 12/31/2017 1524    ProBNP No results found for: PROBNP   Education Assessment and Provision:  Detailed  education and instructions provided on heart failure disease management including the following:  Signs and symptoms of Heart Failure When to call the physician Importance of daily weights Low sodium diet Fluid restriction Medication management Anticipated future follow-up appointments  Patient education given on each of the above topics.  Patient acknowledges understanding and acceptance of all instructions.  I spoke with Ms. Wartman regarding her HF and current hospitalization.  She tells me that she has a scale and weighs daily.  We discussed the importance of daily weights and when to contact the physician.  I reviewed briefly a low sodium diet and high sodium foods to avoid.  She denies any issues with getting or taking prescribed medications.  She inquired about outpatient cardiac rehab and I told her that I would investigate for her.  She will follow with the AHF clinic after discharge.   Education Materials:  "Living Better With Heart Failure" Booklet, Daily Weight Tracker Tool    High Risk Criteria for Readmission and/or Poor Patient Outcomes:  (Recommend Follow-up with Advanced Heart Failure Clinic)--yes   EF <30%- No 65-70%  2 or more admissions in 6 months-Yes  Difficult social situation- No  Demonstrates medication noncompliance-denies  Barriers of Care:  Knowledge and compliance  Discharge Planning:  Plans to discharge to home with husband and son.  She would benefit from and I will refer her to the HF [facility] for HF education, symptom recognition and compliance reinforcement.

## 2018-01-06 NOTE — Progress Notes (Addendum)
Advanced Heart Failure Rounding Note  PCP-Cardiologist: Lyn Records III, MD   Subjective:    Brisk UOP with 80 mg IV lasix TID + metolazone. -3.5 L and down another 7 lbs. BMET pending this am.   Denies CP or dizziness. SOB continues to improve. No orthopnea or PND. Feeling much better. Walked in the hallways twice yesterday. She has left ankle pain from a recent fall at home.   Xray left foot 01/05/18: Marked soft tissue swelling dorsally. Bones osteoporotic. No fracture or dislocation. Spurring dorsal midfoot. Small inferior calcaneal spur. No appreciable joint space narrowing or erosion.  Objective:   Weight Range: 271 lb 1.6 oz (123 kg) Body mass index is 45.11 kg/m.   Vital Signs:   Temp:  [97.6 F (36.4 C)-98 F (36.7 C)] 98 F (36.7 C) (06/12 1610) Pulse Rate:  [78-97] 93 (06/12 0610) Resp:  [17] 17 (06/12 0608) BP: (101-119)/(57-92) 101/57 (06/12 0608) SpO2:  [90 %-100 %] 94 % (06/12 0610) Weight:  [271 lb 1.6 oz (123 kg)] 271 lb 1.6 oz (123 kg) (06/12 0610) Last BM Date: 01/04/18(6/20)  Weight change: Filed Weights   01/04/18 0300 01/05/18 0601 01/06/18 0610  Weight: 281 lb 14.4 oz (127.9 kg) 278 lb 8 oz (126.3 kg) 271 lb 1.6 oz (123 kg)    Intake/Output:   Intake/Output Summary (Last 24 hours) at 01/06/2018 0737 Last data filed at 01/06/2018 0600 Gross per 24 hour  Intake 1566 ml  Output 5050 ml  Net -3484 ml      Physical Exam    General:  No resp difficulty. HEENT: Normal anicteric Neck: Supple. JVP to ear. Carotids 2+ bilat; no bruits. No thyromegaly or nodule noted. Cor: PMI nondisplaced. RRR, 2/6 SEM LUSB pronounced P2 Lungs: CTAB, normal effort. No wheeze Abdomen: Soft, non-tender, non-distended, no HSM. No bruits or masses. +BS  Extremities: No cyanosis, clubbing, or rash. R and LLE 2+ edema.  Neuro: alert & oriented x 3, cranial nerves grossly intact. moves all 4 extremities w/o difficulty. Affect pleasant   Telemetry   NSR 70s  with IVCD. QTC 490. Personally reviewed.   EKG    No new tracings.   Labs    CBC Recent Labs    01/05/18 0624 01/06/18 0454  WBC 5.7 4.8  HGB 10.2* 9.5*  HCT 33.6* 31.2*  MCV 106.3* 104.0*  PLT 232 231   Basic Metabolic Panel Recent Labs    96/04/54 0514 01/05/18 0624 01/06/18 0454  NA 139 139  --   K 3.1* 3.0*  --   CL 104 100*  --   CO2 25 29  --   GLUCOSE 134* 98  --   BUN 23* 21*  --   CREATININE 1.19* 1.04*  --   CALCIUM 8.4* 8.8*  --   MG 2.2 2.2 2.1   Liver Function Tests No results for input(s): AST, ALT, ALKPHOS, BILITOT, PROT, ALBUMIN in the last 72 hours. No results for input(s): LIPASE, AMYLASE in the last 72 hours. Cardiac Enzymes No results for input(s): CKTOTAL, CKMB, CKMBINDEX, TROPONINI in the last 72 hours.  BNP: BNP (last 3 results) Recent Labs    11/09/17 1213 11/18/17 0454 12/31/17 1524  BNP 316.0* 191.3* 408.6*    ProBNP (last 3 results) No results for input(s): PROBNP in the last 8760 hours.   D-Dimer No results for input(s): DDIMER in the last 72 hours. Hemoglobin A1C No results for input(s): HGBA1C in the last 72 hours. Fasting Lipid Panel  No results for input(s): CHOL, HDL, LDLCALC, TRIG, CHOLHDL, LDLDIRECT in the last 72 hours. Thyroid Function Tests No results for input(s): TSH, T4TOTAL, T3FREE, THYROIDAB in the last 72 hours.  Invalid input(s): FREET3  Other results:   Imaging    Dg Foot 2 Views Left  Result Date: 01/05/2018 CLINICAL DATA:  Pain following fall EXAM: LEFT FOOT - 2 VIEW COMPARISON:  None. FINDINGS: Frontal and lateral views were obtained. There is marked soft tissue swelling throughout the dorsum of the foot. Bones are diffusely osteoporotic. There is no evident fracture or dislocation. Joint spaces appear unremarkable. There is spurring in the dorsal midfoot. There is an inferior calcaneal spur. IMPRESSION: Marked soft tissue swelling dorsally. Bones osteoporotic. No fracture or dislocation.  Spurring dorsal midfoot. Small inferior calcaneal spur. No appreciable joint space narrowing or erosion. Electronically Signed   By: Bretta BangWilliam  Woodruff III M.D.   On: 01/05/2018 13:29     Medications:     Scheduled Medications: . apixaban  5 mg Oral BID  . aspirin EC  81 mg Oral Daily  . cyanocobalamin  1,000 mcg Intramuscular Daily  . diclofenac sodium  2 g Topical QID  . ferrous sulfate  325 mg Oral BID WC  . furosemide  80 mg Intravenous TID  . levothyroxine  175 mcg Oral QAC breakfast  . magnesium oxide  400 mg Oral Daily  . metolazone  2.5 mg Oral Daily  . metoprolol tartrate  12.5 mg Oral BID  . pantoprazole  40 mg Oral Daily  . potassium chloride  40 mEq Oral TID  . sodium chloride flush  3 mL Intravenous Q12H  . spironolactone  25 mg Oral Daily    Infusions: . sodium chloride      PRN Medications: sodium chloride, acetaminophen, albuterol, HYDROcodone-acetaminophen, hydrOXYzine, sodium chloride flush, zolpidem    Patient Profile   [name] is a 62 year old with a history of congenital bicuspid aortic valve s/p porcine AVR + aortic root replacement 2018, HTN, hypothyroidism, OSA, morbid obesity, DVT/PE  on chronic eliquis, and chronic diastolic heart failure.  Admitted with marked volume overload.   Assessment/Plan   1. A/C Diastolic Heart Failure. Suspect PAH/RV failure due to OHS -ECHO 01/01/2018 EF 65-70%. Marked volume overload. Weight  up >20 pounds from recent discharge (discharge weight 257 pounds)  - Volume status remains elevated - Continue 80 mg IV lasix TID + 2.5 mg metolazone daily - Continue spiro 25 mg daily. BMET pending.  - Will need RHC once fully diuresed.    2. Suspected OSA - Sleep study completed 12/29/2017. No change.   3. Morbid Obesity  - Body mass index is 45.11 kg/m. Needs to lose weight.   4. H/O DVT/PE - On eliquis. No change.    5. H/O of Congenital Bicuspid Valve w/ Severe Stenosis: s/p bioprosthetic aortic valve  replacement with aortic root placement, per Dr. Cornelius Moraswen June 2018. No change.   6. Hypokalemia.  - BMET pending  Medication concerns reviewed with patient and pharmacy team. Barriers identified: last admit she was noncompliant with medications. May need HF Paramedicine when she is discharged.   Length of Stay: 6  Christine HighlandAshley M Smith, NP  01/06/2018, 7:37 AM  Advanced Heart Failure Team Pager [phone] (M-F; 7a - 4p)  Please contact [facility] for night-coverage after hours (4p -7a ) and weekends on [email or web address]  Patient seen and examined with the above-signed Advanced Practice Provider and/or Housestaff. I personally reviewed laboratory data, imaging studies and relevant  notes. I independently examined the patient and formulated the important aspects of the plan. I have edited the note to reflect any of my changes or salient points. I have personally discussed the plan with the patient and/or family.  Volume status improving nicely but still volume overloaded. Continue IV lasix. K low. Will supp. Continue spiro. Will need RHC when fully diuresed. Possibly Firday.   Arvilla Meres, MD  6:08 PM

## 2018-01-06 NOTE — Progress Notes (Signed)
PROGRESS NOTE  Christine Hicks  ZOX:[id number] DOB: Jul 07, 1956 DOA: 12/31/2017 PCP: Annita Brod, MD   Brief Narrative: Christine Hicks a 62 y.o.femalewith medical history significant ofmorbid obesity, hypertension,dCHF, DVT on Eliquis, GERD, CKD-2, aortic stenosis,s/p ofaortic root replacement and valve replacement, iron deficiency anemia, who presents with shortness breath and leg edema.  Patient states that she has shortness of breath in the past several days, which has been progressively getting worse. She does not have chest pain. She has dry cough. She also noted worsening bilateral leg edema. Her primary care doctor increased her torsemide dose to 60 mg twice a day without significant improvement. Patient states that she has gained weight by approximately 20 pounds since April. She has generalized weakness.She states that she fell twice accidentally without significant injury. She has bruise on the right arm.No unilateral weakness, numbness or tingling his extremities. No facial droop or slurred speech. Denies nausea, vomiting, diarrhea, abdominal pain, symptoms of UTI.  Assessment & Plan: Principal Problem:   Acute on chronic diastolic heart failure (HCC) Active Problems:   Essential hypertension   DVT, recurrent, lower extremity, chronic, bilateral (HCC)   Iron deficiency anemia   GERD (gastroesophageal reflux disease)   Acute renal failure superimposed on stage 2 chronic kidney disease (HCC)  Acute on chronic HFpEF, RV failure, PAH: EF 65-70%, severe TV regurgitation, increased PASP. - Continue diuresis per HF team, lasix 80mg  IV TID and daily metolazone. Increased spironolactone.   - Continue daily weights, I/O. Down 282lbs >> 271lbs. Cr with slight bump today.  - Plan for RHC once improved volume status per AHF team.  Hypokalemia: Resistant to replacement, has been recurring problem. No GI losses.  - Continue TID today, Mg ok.   Frequent falls: No  focal deficits on exam. Negative head CT.  - Continue PT/OT efforts to address deconditioning.   Left foot pain: With tissue swelling on exam/XR without fracture/dislocation.  - Symptomatic management  Venous insufficiency with stasis dermatitis: Tender erythema worse R > L. Without fever, warmth or leukocytosis, empiric abx were stopped.  - Elevate legs, diurese as above.   AKI on stage II CKD: Cr returned to baseline with diuresis. No proteinuria noted  HTN:  - Metoprolol dose reduced while aggressively diuresing and with bradycardia.  - Continue to monitor   Tremor: subacute worse with activity, not associated with anxiety. TSH wnl.  - Monitor, follow up as outpatient.   Recurrent chronic bilateral LE DVT: U/S was limited but showed no DVTs. - Continue eliquis  Hypothyroidism: TSH 0.487 - continue synthroid  Vitamin B12 deficiency:  - Continue replacement  History of congenital bicuspid AoV w/severe stenosis s/p bioprosthetic AVR June 2018 by Dr. Cornelius Moras.   Morbid obesity, OHS:  - Nutrition counseling provided  QT prolongation:  - Monitor telemetry, limit provocative medications - Keep K and Mg replaced  DVT prophylaxis: Eliquis Code Status: Full Family Communication: None at bedside Disposition Plan: Home once closer to dry weight.   Consultants:   HF team  Procedures:   Echocardiogram 01/01/2018: - Left ventricle: The cavity size was normal. Systolic function was   vigorous. The estimated ejection fraction was in the range of 65%   to 70%. Wall motion was normal; there were no regional wall   motion abnormalities. There was no evidence of elevated   ventricular filling pressure by Doppler parameters. - Right atrium: The atrium was severely dilated. - Tricuspid valve: There was severe regurgitation. - Pulmonary arteries: Systolic pressure was mildly  increased. PA   peak pressure: 39 mm Hg (S).  Antimicrobials:  Keflex 6/9 - 6/11  Subjective: Breathing  modestly improved, no chest pain.   Objective: Vitals:   01/05/18 2246 01/06/18 0608 01/06/18 0609 01/06/18 0610  BP: (!) 107/59 (!) 101/57    Pulse: 82 95  93  Resp:  17    Temp:  98 F (36.7 C)    TempSrc:  Oral    SpO2:  90% 93% 94%  Weight:    123 kg (271 lb 1.6 oz)  Height:        Intake/Output Summary (Last 24 hours) at 01/06/2018 1711 Last data filed at 01/06/2018 1629 Gross per 24 hour  Intake 1243 ml  Output 5500 ml  Net -4257 ml   Filed Weights   01/04/18 0300 01/05/18 0601 01/06/18 0610  Weight: 127.9 kg (281 lb 14.4 oz) 126.3 kg (278 lb 8 oz) 123 kg (271 lb 1.6 oz)    Gen: Pleasant, obese female in no distress Pulm: Non-labored breathing. Clear to auscultation bilaterally.  CV: Regular rate and rhythm. II/VI RUSB SEM. + JVD, 2+ pedal edema. GI: Abdomen soft, non-tender, non-distended, with normoactive bowel sounds. No organomegaly or masses felt. Ext: Warm, no deformities Skin: No rashes, lesions or ulcers Neuro: Alert and oriented. No focal neurological deficits. Psych: Judgement and insight appear normal. Mood & affect appropriate.   Data Reviewed: I have personally reviewed following labs and imaging studies  CBC: Recent Labs  Lab 01/02/18 0434 01/03/18 0923 01/04/18 0514 01/05/18 0624 01/06/18 0454  WBC 6.7 6.0 5.7 5.7 4.8  HGB 10.2* 10.1* 9.8* 10.2* 9.5*  HCT 34.4* 32.9* 32.3* 33.6* 31.2*  MCV 105.5* 106.5* 106.6* 106.3* 104.0*  PLT 247 236 221 232 231   Basic Metabolic Panel: Recent Labs  Lab 01/02/18 0434 01/03/18 0634 01/04/18 0514 01/05/18 0624 01/06/18 0454 01/06/18 0741 01/06/18 1414  NA 141 142 139 139  --  138 135  K 4.0 3.4* 3.1* 3.0*  --  2.8* 3.2*  CL 106 105 104 100*  --  97* 95*  CO2 26 26 25 29   --  30 30  GLUCOSE 102* 80 134* 98  --  101* 128*  BUN 29* 26* 23* 21*  --  23* 22*  CREATININE 1.43* 1.35* 1.19* 1.04*  --  1.13* 1.18*  CALCIUM 8.7* 8.6* 8.4* 8.8*  --  8.9 8.6*  MG 2.5* 2.3 2.2 2.2 2.1  --   --     GFR: Estimated Creatinine Clearance: 64.2 mL/min (A) (by C-G formula based on SCr of 1.18 mg/dL (H)). Liver Function Tests: Recent Labs  Lab 01/03/18 0634  AST 17  ALT 11*  ALKPHOS 76  BILITOT 1.1  PROT 6.1*  ALBUMIN 2.6*   No results for input(s): LIPASE, AMYLASE in the last 168 hours. No results for input(s): AMMONIA in the last 168 hours. Coagulation Profile: No results for input(s): INR, PROTIME in the last 168 hours. Cardiac Enzymes: Recent Labs  Lab 01/01/18 0353 01/01/18 0944  TROPONINI <0.03 <0.03   BNP (last 3 results) No results for input(s): PROBNP in the last 8760 hours. HbA1C: No results for input(s): HGBA1C in the last 72 hours. CBG: No results for input(s): GLUCAP in the last 168 hours. Lipid Profile: No results for input(s): CHOL, HDL, LDLCALC, TRIG, CHOLHDL, LDLDIRECT in the last 72 hours. Thyroid Function Tests: No results for input(s): TSH, T4TOTAL, FREET4, T3FREE, THYROIDAB in the last 72 hours. Anemia Panel: Recent Labs  01/05/18 0624  VITAMINB12 >7,500*  FOLATE 6.8  FERRITIN 37  TIBC 406  IRON 31  RETICCTPCT 3.9*   Urine analysis:    Component Value Date/Time   COLORURINE YELLOW 01/01/2018 1222   APPEARANCEUR CLEAR 01/01/2018 1222   LABSPEC 1.009 01/01/2018 1222   PHURINE 6.0 01/01/2018 1222   GLUCOSEU NEGATIVE 01/01/2018 1222   HGBUR SMALL (A) 01/01/2018 1222   BILIRUBINUR NEGATIVE 01/01/2018 1222   KETONESUR NEGATIVE 01/01/2018 1222   PROTEINUR NEGATIVE 01/01/2018 1222   UROBILINOGEN 0.2 06/22/2010 2055   NITRITE NEGATIVE 01/01/2018 1222   LEUKOCYTESUR SMALL (A) 01/01/2018 1222   No results found for this or any previous visit (from the past 240 hour(s)).    Radiology Studies: Dg Foot 2 Views Left  Result Date: 01/05/2018 CLINICAL DATA:  Pain following fall EXAM: LEFT FOOT - 2 VIEW COMPARISON:  None. FINDINGS: Frontal and lateral views were obtained. There is marked soft tissue swelling throughout the dorsum of the foot.  Bones are diffusely osteoporotic. There is no evident fracture or dislocation. Joint spaces appear unremarkable. There is spurring in the dorsal midfoot. There is an inferior calcaneal spur. IMPRESSION: Marked soft tissue swelling dorsally. Bones osteoporotic. No fracture or dislocation. Spurring dorsal midfoot. Small inferior calcaneal spur. No appreciable joint space narrowing or erosion. Electronically Signed   By: Bretta BangWilliam  Woodruff III M.D.   On: 01/05/2018 13:29    Scheduled Meds: . apixaban  5 mg Oral BID  . aspirin EC  81 mg Oral Daily  . cyanocobalamin  1,000 mcg Intramuscular Daily  . diclofenac sodium  2 g Topical QID  . ferrous sulfate  325 mg Oral BID WC  . furosemide  80 mg Intravenous TID  . levothyroxine  175 mcg Oral QAC breakfast  . magnesium oxide  400 mg Oral Daily  . metolazone  2.5 mg Oral Daily  . metoprolol tartrate  12.5 mg Oral BID  . pantoprazole  40 mg Oral Daily  . potassium chloride  40 mEq Oral TID  . sodium chloride flush  3 mL Intravenous Q12H  . spironolactone  25 mg Oral Daily   Continuous Infusions: . sodium chloride       LOS: 6 days   Time spent: 25 minutes.  Tyrone Nineyan B Damion Kant, MD [facility] [email or web address] Password [email or web address] 01/06/2018, 5:11 PM

## 2018-01-07 ENCOUNTER — Telehealth (HOSPITAL_COMMUNITY): Payer: Self-pay

## 2018-01-07 ENCOUNTER — Other Ambulatory Visit (HOSPITAL_COMMUNITY): Payer: Self-pay

## 2018-01-07 DIAGNOSIS — K219 Gastro-esophageal reflux disease without esophagitis: Secondary | ICD-10-CM

## 2018-01-07 DIAGNOSIS — N182 Chronic kidney disease, stage 2 (mild): Secondary | ICD-10-CM

## 2018-01-07 DIAGNOSIS — I10 Essential (primary) hypertension: Secondary | ICD-10-CM

## 2018-01-07 DIAGNOSIS — N179 Acute kidney failure, unspecified: Secondary | ICD-10-CM

## 2018-01-07 DIAGNOSIS — I82503 Chronic embolism and thrombosis of unspecified deep veins of lower extremity, bilateral: Secondary | ICD-10-CM

## 2018-01-07 DIAGNOSIS — D500 Iron deficiency anemia secondary to blood loss (chronic): Secondary | ICD-10-CM

## 2018-01-07 LAB — BASIC METABOLIC PANEL
Anion gap: 11 (ref 5–15)
BUN: 25 mg/dL — AB (ref 6–20)
CHLORIDE: 93 mmol/L — AB (ref 101–111)
CO2: 32 mmol/L (ref 22–32)
Calcium: 8.8 mg/dL — ABNORMAL LOW (ref 8.9–10.3)
Creatinine, Ser: 1.21 mg/dL — ABNORMAL HIGH (ref 0.44–1.00)
GFR calc Af Amer: 54 mL/min — ABNORMAL LOW (ref >60)
GFR calc non Af Amer: 47 mL/min — ABNORMAL LOW (ref >60)
Glucose, Bld: 95 mg/dL (ref 65–99)
POTASSIUM: 3.2 mmol/L — AB (ref 3.5–5.1)
Sodium: 136 mmol/L (ref 135–145)

## 2018-01-07 LAB — CBC
HCT: 32.3 % — ABNORMAL LOW (ref 36.0–46.0)
Hemoglobin: 9.9 g/dL — ABNORMAL LOW (ref 12.0–15.0)
MCH: 32.2 pg (ref 26.0–34.0)
MCHC: 30.7 g/dL (ref 30.0–36.0)
MCV: 105.2 fL — AB (ref 78.0–100.0)
PLATELETS: 224 10*3/uL (ref 150–400)
RBC: 3.07 MIL/uL — AB (ref 3.87–5.11)
RDW: 16.6 % — ABNORMAL HIGH (ref 11.5–15.5)
WBC: 5.5 10*3/uL (ref 4.0–10.5)

## 2018-01-07 MED ORDER — POTASSIUM CHLORIDE 10 MEQ/100ML IV SOLN
10.0000 meq | INTRAVENOUS | Status: AC
  Administered 2018-01-07 (×2): 10 meq via INTRAVENOUS
  Filled 2018-01-07 (×2): qty 100

## 2018-01-07 MED ORDER — POTASSIUM CHLORIDE 10 MEQ/100ML IV SOLN
INTRAVENOUS | Status: AC
  Administered 2018-01-07: 10 meq
  Filled 2018-01-07: qty 100

## 2018-01-07 MED ORDER — SODIUM CHLORIDE 0.9% FLUSH: 3.0000 mL | INTRAVENOUS | Status: DC | PRN

## 2018-01-07 MED ORDER — SODIUM CHLORIDE 0.9% FLUSH
3.0000 mL | Freq: Two times a day (BID) | INTRAVENOUS | Status: DC
  Administered 2018-01-08 – 2018-01-11 (×6): 3 mL via INTRAVENOUS

## 2018-01-07 MED ORDER — SODIUM CHLORIDE 0.9 % IV SOLN: 250.0000 mL | INTRAVENOUS | Status: DC | PRN

## 2018-01-07 MED ORDER — SODIUM CHLORIDE 0.9 % IV SOLN
INTRAVENOUS | Status: DC
  Administered 2018-01-08: 06:00:00 via INTRAVENOUS

## 2018-01-07 NOTE — Plan of Care (Signed)
  Problem: Coping: Goal: Level of anxiety will decrease Outcome: Completed/Met   Problem: Elimination: Goal: Will not experience complications related to bowel motility Outcome: Completed/Met   Problem: Safety: Goal: Ability to remain free from injury will improve Outcome: Completed/Met   

## 2018-01-07 NOTE — Progress Notes (Signed)
Pt reports having already bathed and dressed earlier with nursing staff. Worked on toileting and standing grooming. Pt progressing steadily.    01/07/18 1300  OT Visit Information  Last OT Received On 01/07/18  Assistance Needed +1  History of Present Illness Pt is a 62 y.o. female admitted 12/31/17 with c/o SOB and LE edema; worked up for CHF. PMHx: multiple falls, morbid obesity, HTN, dCHF, DVT on Eliquis, GERD, CKD-2, aortic stenosis, previous aortic root replacement and valve replacement (2018), iron deficiency anemia.  Precautions  Precautions Fall  Pain Assessment  Pain Assessment Faces  Faces Pain Scale 4  Pain Location L arm IV site likely due to potassium  Pain Descriptors / Indicators Burning  Pain Intervention(s) Repositioned  Cognition  Arousal/Alertness Awake/alert  Behavior During Therapy WFL for tasks assessed/performed  Overall Cognitive Status Within Functional Limits for tasks assessed  General Comments Pt wanting purewick back, [facility]  ADL  Overall ADL's  Needs assistance/impaired  Grooming Wash/dry hands;Standing;Supervision/safety  Psychologist, clinicalToilet Transfer Supervision/safety;RW;Ambulation;BSC  Toileting- Clothing Manipulation and Hygiene Sit to/from stand;Supervision/safety  Bed Mobility  General bed mobility comments Pt sitting up in chair   Transfers  Overall transfer level Needs assistance  Equipment used Rolling walker (2 wheeled)  Transfers Sit to/from Stand  Sit to Stand Supervision  General transfer comment no physical assist, supervision for safety and IV line  OT - End of Session  Equipment Utilized During Treatment Rolling walker;Gait belt  Activity Tolerance Patient tolerated treatment well  Patient left in chair;with call bell/phone within reach  OT Assessment/Plan  OT Plan Discharge plan remains appropriate  OT Visit Diagnosis Unsteadiness on feet (R26.81)  OT Frequency (ACUTE ONLY) Min 2X/week  Follow Up Recommendations No OT  follow up;Supervision - Intermittent  OT Equipment None recommended by OT  AM-PAC OT "6 Clicks" Daily Activity Outcome Measure  Help from another person eating meals? 4  Help from another person taking care of personal grooming? 3  Help from another person toileting, which includes using toliet, bedpan, or urinal? 3  Help from another person bathing (including washing, rinsing, drying)? 3  Help from another person to put on and taking off regular upper body clothing? 3  Help from another person to put on and taking off regular lower body clothing? 3  6 Click Score 19  ADL G Code Conversion CK  OT Goal Progression  Progress towards OT goals Progressing toward goals  Acute Rehab OT Goals  Patient Stated Goal to get strength back and get back to normal   OT Goal Formulation With patient  Time For Goal Achievement 01/15/18  Potential to Achieve Goals Good  OT Time Calculation  OT Start Time (ACUTE ONLY) 1213  OT Stop Time (ACUTE ONLY) 1230  OT Time Calculation (min) 17 min  OT General Charges  $OT Visit 1 Visit  OT Treatments  $Self Care/Home Management  8-22 mins  01/07/2018 Martie RoundJulie Dhruv Christina, OTR/L Pager: [phone]

## 2018-01-07 NOTE — Progress Notes (Addendum)
Advanced Heart Failure Rounding Note  PCP-Cardiologist: Lyn Records III, MD   Subjective:    Brisk UOP with 80 mg IV lasix TID + metolazone. -4 L and down another 5 lbs. BMET pending this am.   SOB is improving. Denies CP or dizziness. Did not walk yesterday, but plans to today. Says she is peeing non-stop and hard to do anything else. Swelling much improved. No orthopnea or PND.   Prolonged QT alarms on tele - Will check EKG. My measurement was 470 Christine  Xray left foot 01/05/18: Marked soft tissue swelling dorsally. Bones osteoporotic. No fracture or dislocation. Spurring dorsal midfoot. Small inferior calcaneal spur. No appreciable joint space narrowing or erosion.  Objective:   Weight Range: 266 lb 9.6 oz (120.9 kg) Body mass index is 44.36 kg/m.   Vital Signs:   Temp:  [98.3 F (36.8 C)-99.4 F (37.4 C)] 98.3 F (36.8 C) (06/13 0419) Pulse Rate:  [95-100] 95 (06/13 0419) Resp:  [18-20] 20 (06/13 0419) BP: (91-105)/(58-67) 91/64 (06/13 0419) SpO2:  [91 %-92 %] 91 % (06/13 0419) Weight:  [266 lb 9.6 oz (120.9 kg)] 266 lb 9.6 oz (120.9 kg) (06/13 0419) Last BM Date: 01/04/18  Weight change: Filed Weights   01/05/18 0601 01/06/18 0610 01/07/18 0419  Weight: 278 lb 8 oz (126.3 kg) 271 lb 1.6 oz (123 kg) 266 lb 9.6 oz (120.9 kg)    Intake/Output:   Intake/Output Summary (Last 24 hours) at 01/07/2018 0743 Last data filed at 01/07/2018 0449 Gross per 24 hour  Intake 1603 ml  Output 5600 ml  Net -3997 ml      Physical Exam    General: No resp difficulty. HEENT: Normal anicteric  Neck: Supple. JVP to ear. Carotids 2+ bilat; no bruits. No thyromegaly or nodule noted. Cor: PMI nondisplaced. RRR, 2/6 SEM LUSB, pronounced P2 Lungs: CTAB, normal effort. Abdomen: Obese Soft, non-tender, non-distended, no HSM. No bruits or masses. +BS  Extremities: No cyanosis, clubbing, or rash. R and LLE 1-2+ edema mildly tender to touch Neuro: alert & oriented x 3, cranial  nerves grossly intact. moves all 4 extremities w/o difficulty. Affect pleasant   Telemetry   NSR 70-90s. Several alarms for prolonged QT. My measurement was 470. Personally reviewed.   EKG    No new tracings.   Labs    CBC Recent Labs    01/06/18 0454 01/07/18 0611  WBC 4.8 5.5  HGB 9.5* 9.9*  HCT 31.2* 32.3*  MCV 104.0* 105.2*  PLT 231 224   Basic Metabolic Panel Recent Labs    16/10/96 0624 01/06/18 0454 01/06/18 0741 01/06/18 1414  NA 139  --  138 135  K 3.0*  --  2.8* 3.2*  CL 100*  --  97* 95*  CO2 29  --  30 30  GLUCOSE 98  --  101* 128*  BUN 21*  --  23* 22*  CREATININE 1.04*  --  1.13* 1.18*  CALCIUM 8.8*  --  8.9 8.6*  MG 2.2 2.1  --   --    Liver Function Tests No results for input(s): AST, ALT, ALKPHOS, BILITOT, PROT, ALBUMIN in the last 72 hours. No results for input(s): LIPASE, AMYLASE in the last 72 hours. Cardiac Enzymes No results for input(s): CKTOTAL, CKMB, CKMBINDEX, TROPONINI in the last 72 hours.  BNP: BNP (last 3 results) Recent Labs    11/09/17 1213 11/18/17 0454 12/31/17 1524  BNP 316.0* 191.3* 408.6*    ProBNP (last 3 results)  No results for input(s): PROBNP in the last 8760 hours.   D-Dimer No results for input(s): DDIMER in the last 72 hours. Hemoglobin A1C No results for input(s): HGBA1C in the last 72 hours. Fasting Lipid Panel No results for input(s): CHOL, HDL, LDLCALC, TRIG, CHOLHDL, LDLDIRECT in the last 72 hours. Thyroid Function Tests No results for input(s): TSH, T4TOTAL, T3FREE, THYROIDAB in the last 72 hours.  Invalid input(s): FREET3  Other results:   Imaging    No results found.   Medications:     Scheduled Medications: . apixaban  5 mg Oral BID  . aspirin EC  81 mg Oral Daily  . cyanocobalamin  1,000 mcg Intramuscular Daily  . diclofenac sodium  2 g Topical QID  . ferrous sulfate  325 mg Oral BID WC  . furosemide  80 mg Intravenous TID  . levothyroxine  175 mcg Oral QAC breakfast  .  magnesium oxide  400 mg Oral Daily  . metolazone  2.5 mg Oral Daily  . metoprolol tartrate  12.5 mg Oral BID  . pantoprazole  40 mg Oral Daily  . potassium chloride  40 mEq Oral TID  . sodium chloride flush  3 mL Intravenous Q12H  . spironolactone  25 mg Oral Daily    Infusions: . 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  Admitted with marked volume overload.   Assessment/Plan   1. A/C Diastolic Heart Failure. Suspect PAH/RV failure due to OHS -ECHO 01/01/2018 EF 65-70%. Marked volume overload. Weight  up >20 pounds from recent discharge (discharge weight 257 pounds)  - Volume status improving, but remains elevated. Still 10 lbs over recent DC weight.   - Continue 80 mg IV lasix TID + 2.5 mg metolazone daily - Continue spiro 25 mg daily. BMET pending this am.  - Will schedule for RHC tomorrow.  - Apply TED hose   2. Suspected OSA - Sleep study completed 12/29/2017. No change.    3. Morbid Obesity  - Body mass index is 44.36 kg/m. - Needs to lose weight.   4. H/O DVT/PE - On eliquis. No change.   5. H/O of Congenital Bicuspid Valve w/ Severe Stenosis: s/p bioprosthetic aortic valve replacement with aortic root placement, per Dr. Cornelius Moraswen June 2018. No change.   6. Hypokalemia.  - K 2.8 -> 3.2 yesterday. Received IV and PO supp. BMET pending this am.   7. Prolonged QT - I measured 470, but several alarms on tele for >500 Christine. QTc on 6/9 was 520. Check EKG.  Medication concerns reviewed with patient and pharmacy team. Barriers identified: last admit she was noncompliant with medications. May need HF Paramedicine  when she is discharged.   Length of Stay: 7  Alford HighlandAshley M Smith, NP  01/07/2018, 7:43 AM  Advanced Heart Failure Team Pager [phone] (M-F; 7a - 4p)  Please contact [facility] for night-coverage after hours (4p -7a ) and weekends on [email or web address]  Patient seen and examined with the above-signed Advanced Practice Provider and/or Housestaff. I personally reviewed laboratory data, imaging studies and relevant notes. I independently examined the patient and formulated the important aspects of the plan. I have edited the note to  reflect any of my changes or salient points. I have personally discussed the plan with the patient and/or family.  Volume status continues to improve with high-dose Iv lasix but still with much more fluid on board. Renal function stable. k low. Will supp. Will need RHC once fully diuresed. Possibly tomorrow or Monday. Eliquis dosing discussed with PharmD.   Arvilla Meres, MD  11:31 AM

## 2018-01-07 NOTE — Progress Notes (Signed)
Pt called to make an appointment for our initial CHP visit but no answer/vm left. I will f/u with pt tomorrow.  vm left on pt's home phone/cell phone is off.

## 2018-01-07 NOTE — Procedures (Signed)
   Patient Name: Christine Hicks, Annamay Study Date:07/14/2017 12/29/2017 Gender: Female D.O.B: 07-02-1956 Age (years): 62 Referring Provider: Norma FredricksonLori Gerhardt Height (inches): 65 Interpreting Physician: Armanda Magicraci Turner MD, ABSM Weight (lbs): 263 RPSGT: Lowry RamMckinney, Takeya BMI: 44 MRN: [id number] Neck Size: 14.50  CLINICAL INFORMATION Sleep Study Type: NPSG  Indication for sleep study: Daytime Fatigue, Hypertension, Morbid Obesity, Snoring  Epworth Sleepiness Score: 3  SLEEP STUDY TECHNIQUE As per the AASM Manual for the Scoring of Sleep and Associated Events v2.3 (April 2016) with a hypopnea requiring 4% desaturations.  The channels recorded and monitored were frontal, central and occipital EEG, electrooculogram (EOG), submentalis EMG (chin), nasal and oral airflow, thoracic and abdominal wall motion, anterior tibialis EMG, snore microphone, electrocardiogram, and pulse oximetry.  MEDICATIONS Medications self-administered by patient taken the night of the study : N/A  SLEEP ARCHITECTURE The study was initiated at 10:23:09 PM and ended at 5:56:22 AM.  Sleep onset time was 7.9 minutes and the sleep efficiency was 70.2%%. The total sleep time was 318 minutes.  Stage REM latency was 278.0 minutes.  The patient spent 5.2%% of the night in stage N1 sleep, 85.5%% in stage N2 sleep, 0.0%% in stage N3 and 9.28% in REM.  Alpha intrusion was absent.  Supine sleep was 74.21%.  RESPIRATORY PARAMETERS The overall apnea/hypopnea index (AHI) was 0.2 per hour. There were 0 total apneas, including 0 obstructive, 0 central and 0 mixed apneas. There were 1 hypopneas and 0 RERAs.  The AHI during Stage REM sleep was 0.0 per hour.  AHI while supine was 0.0 per hour.  The mean oxygen saturation was 92.9%. The minimum SpO2 during sleep was 88.0%.  soft snoring was noted during this study.  CARDIAC DATA The 2 lead EKG demonstrated sinus rhythm. The mean heart rate was 89.6 beats per minute. Other EKG  findings include: PVCs.  LEG MOVEMENT DATA The total PLMS were 0 with a resulting PLMS index of 0.0. Associated arousal with leg movement index was 0.0 .  IMPRESSIONS - No significant obstructive sleep apnea occurred during this study (AHI = 0.2/h). - No significant central sleep apnea occurred during this study (CAI = 0.0/h). - The patient had minimal or no oxygen desaturation during the study (Min O2 = 88.0%) - The patient snored with soft snoring volume. - EKG findings include PVCs. - Clinically significant periodic limb movements did not occur during sleep. No significant associated arousals.  DIAGNOSIS - PVCs  RECOMMENDATIONS - Avoid alcohol, sedatives and other CNS depressants that may worsen sleep apnea and disrupt normal sleep architecture. - Sleep hygiene should be reviewed to assess factors that may improve sleep quality. - Weight management and regular exercise should be initiated or continued if appropriate.  [Electronically signed] 01/07/2018 06:41 PM  Armanda Magicraci Turner MD, ABSM Diplomate, American Board of Sleep Medicine

## 2018-01-07 NOTE — Progress Notes (Signed)
PROGRESS NOTE  [name]  WUJ:[id number] DOB: 07/22/1956 DOA: 12/31/2017 PCP: Annita BrodAsenso, Philip, MD   Brief Narrative: Christine DowningJudith M Staleyis a 62 y.o.femalewith medical history significant ofmorbid obesity, hypertension,dCHF, DVT on Eliquis, GERD, CKD-2, aortic stenosis,s/p ofaortic root replacement and valve replacement, iron deficiency anemia, who presents with shortness breath and leg edema.  Patient states that she has shortness of breath in the past several days, which has been progressively getting worse. She does not have chest pain. She has dry cough. She also noted worsening bilateral leg edema. Her primary care doctor increased her torsemide dose to 60 mg twice a day without significant improvement. Patient states that she has gained weight by approximately 20 pounds since April. She has generalized weakness.She states that she fell twice accidentally without significant injury. She has bruise on the right arm.No unilateral weakness, numbness or tingling his extremities. No facial droop or slurred speech. Denies nausea, vomiting, diarrhea, abdominal pain, symptoms of UTI.  Assessment & Plan: Principal Problem:   Acute on chronic diastolic heart failure (HCC) Active Problems:   Essential hypertension   DVT, recurrent, lower extremity, chronic, bilateral (HCC)   Iron deficiency anemia   GERD (gastroesophageal reflux disease)   Acute renal failure superimposed on stage 2 chronic kidney disease (HCC)  Acute on chronic HFpEF, RV failure, PAH: EF 65-70%, severe TV regurgitation, increased PASP. - Continue diuresis per HF team, lasix 80mg  IV TID and daily metolazone. Increased spironolactone. BP soft but tolerating. Not orthostatic w/PT today. - Continue daily weights, I/O. Down 282lbs >> 266lbs (another 5lbs down) with last DC weight 257lbs. Cr with slight bump today.  - Plan for RHC 6/14 per AHF team.  Hypokalemia: Resistant to replacement, has been recurring problem. No  GI losses.  - Continue 40mEq po TID and add some by IV if can tolerate this.  Frequent falls: No focal deficits on exam. Negative head CT.  - Continue PT/OT efforts to address deconditioning. Plan DC w/HH.   Left foot pain: With tissue swelling on exam/XR without fracture/dislocation. Possibly related to previous fall. No evidence of acute gout at this time. - Symptomatic management  Venous insufficiency with stasis dermatitis: Tender erythema worse R > L. Without fever, warmth or leukocytosis, empiric abx were stopped.  - Elevate legs, diurese as above.  - Place TED hose if able.  AKI on stage II CKD: Cr returned to baseline with diuresis. No proteinuria noted  HTN:  - Metoprolol dose reduced while aggressively diuresing and with bradycardia. 1st deg AVB on ECG 6/13. - Continue to monitor   Tremor: subacute worse with activity, not associated with anxiety. TSH wnl.  - Monitor, follow up as outpatient.   Recurrent chronic bilateral LE DVT: U/S was limited but showed no DVTs. - Continue eliquis  Hypothyroidism: TSH 0.487 - Continue synthroid  Vitamin B12 deficiency:  - Continue replacement  History of congenital bicuspid AoV w/severe stenosis s/p bioprosthetic AVR June 2018 by Dr. Cornelius Moraswen.   Morbid obesity, OHS:  - Nutrition counseling provided  QT prolongation: 526msec QTc this AM. 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 - Keep K and Mg replaced as above  DVT prophylaxis: Eliquis (does not meet criteria for dose reduction) Code Status: Full Family Communication: Son at bedside Disposition Plan: Home once closer to dry weight, pending RHC.   Consultants:   HF team  Procedures:   Echocardiogram 01/01/2018: - Left ventricle: The cavity size was normal. Systolic function was  vigorous. The estimated ejection fraction was in the range of 65%   to 70%. Wall motion was normal; there were no regional wall   motion abnormalities.  There was no evidence of elevated   ventricular filling pressure by Doppler parameters. - Right atrium: The atrium was severely dilated. - Tricuspid valve: There was severe regurgitation. - Pulmonary arteries: Systolic pressure was mildly increased. PA   peak pressure: 39 mm Hg (S).  Antimicrobials:  Keflex 6/9 - 6/11  Subjective: Getting better everyday. Has some IV site pain with potassium infusion. Ready to work with PT. No chest pain. Breathing improved but she hasn't tested herself.  Objective: Vitals:   01/06/18 1932 01/06/18 2340 01/07/18 0419 01/07/18 1145  BP: 92/64 (!) 105/58 91/64 (!) 101/55  Pulse: 99 100 95 66  Resp: 20  20 18   Temp: 99.4 F (37.4 C)  98.3 F (36.8 C) 97.9 F (36.6 C)  TempSrc: Oral  Oral Oral  SpO2: 92%  91% 95%  Weight:   120.9 kg (266 lb 9.6 oz)   Height:        Intake/Output Summary (Last 24 hours) at 01/07/2018 1328 Last data filed at 01/07/2018 1100 Gross per 24 hour  Intake 800 ml  Output 4950 ml  Net -4150 ml   Filed Weights   01/05/18 0601 01/06/18 0610 01/07/18 0419  Weight: 126.3 kg (278 lb 8 oz) 123 kg (271 lb 1.6 oz) 120.9 kg (266 lb 9.6 oz)    Gen: Pleasant, obese female in no distress laying in bed Pulm: Non-labored breathing. Distant but clear to auscultation bilaterally.  CV: Regular. II/VI RUSB SEM. + JVD, 1+ pedal edema. GI: Abdomen soft, non-tender, non-distended, with normoactive bowel sounds. No organomegaly or masses felt. Ext: Warm, no deformities. Left foot with tenderness to palpation (same as with bilateral LE's diffusely) without specific tenderness at MTP or plantar fascia/calcaneus.  Skin: No rashes, lesions or ulcers Neuro: Alert and oriented. No focal neurological deficits. Psych: Judgement and insight appear normal. Mood & affect appropriate.   Data Reviewed: I have personally reviewed following labs and imaging studies  CBC: Recent Labs  Lab 01/03/18 0923 01/04/18 0514 01/05/18 0624 01/06/18 0454  01/07/18 0611  WBC 6.0 5.7 5.7 4.8 5.5  HGB 10.1* 9.8* 10.2* 9.5* 9.9*  HCT 32.9* 32.3* 33.6* 31.2* 32.3*  MCV 106.5* 106.6* 106.3* 104.0* 105.2*  PLT 236 221 232 231 224   Basic Metabolic Panel: Recent Labs  Lab 01/02/18 0434 01/03/18 0634 01/04/18 0514 01/05/18 0624 01/06/18 0454 01/06/18 0741 01/06/18 1414 01/07/18 0611  NA 141 142 139 139  --  138 135 136  K 4.0 3.4* 3.1* 3.0*  --  2.8* 3.2* 3.2*  CL 106 105 104 100*  --  97* 95* 93*  CO2 26 26 25 29   --  30 30 32  GLUCOSE 102* 80 134* 98  --  101* 128* 95  BUN 29* 26* 23* 21*  --  23* 22* 25*  CREATININE 1.43* 1.35* 1.19* 1.04*  --  1.13* 1.18* 1.21*  CALCIUM 8.7* 8.6* 8.4* 8.8*  --  8.9 8.6* 8.8*  MG 2.5* 2.3 2.2 2.2 2.1  --   --   --    GFR: Estimated Creatinine Clearance: 62.1 mL/min (A) (by C-G formula based on SCr of 1.21 mg/dL (H)). Liver Function Tests: Recent Labs  Lab 01/03/18 0634  AST 17  ALT 11*  ALKPHOS 76  BILITOT 1.1  PROT 6.1*  ALBUMIN 2.6*   No results  for input(s): LIPASE, AMYLASE in the last 168 hours. No results for input(s): AMMONIA in the last 168 hours. Coagulation Profile: No results for input(s): INR, PROTIME in the last 168 hours. Cardiac Enzymes: Recent Labs  Lab 01/01/18 0353 01/01/18 0944  TROPONINI <0.03 <0.03   BNP (last 3 results) No results for input(s): PROBNP in the last 8760 hours. HbA1C: No results for input(s): HGBA1C in the last 72 hours. CBG: No results for input(s): GLUCAP in the last 168 hours. Lipid Profile: No results for input(s): CHOL, HDL, LDLCALC, TRIG, CHOLHDL, LDLDIRECT in the last 72 hours. Thyroid Function Tests: No results for input(s): TSH, T4TOTAL, FREET4, T3FREE, THYROIDAB in the last 72 hours. Anemia Panel: Recent Labs    01/05/18 0624  VITAMINB12 >7,500*  FOLATE 6.8  FERRITIN 37  TIBC 406  IRON 31  RETICCTPCT 3.9*   Urine analysis:    Component Value Date/Time   COLORURINE YELLOW 01/01/2018 1222   APPEARANCEUR CLEAR 01/01/2018  1222   LABSPEC 1.009 01/01/2018 1222   PHURINE 6.0 01/01/2018 1222   GLUCOSEU NEGATIVE 01/01/2018 1222   HGBUR SMALL (A) 01/01/2018 1222   BILIRUBINUR NEGATIVE 01/01/2018 1222   KETONESUR NEGATIVE 01/01/2018 1222   PROTEINUR NEGATIVE 01/01/2018 1222   UROBILINOGEN 0.2 06/22/2010 2055   NITRITE NEGATIVE 01/01/2018 1222   LEUKOCYTESUR SMALL (A) 01/01/2018 1222   No results found for this or any previous visit (from the past 240 hour(s)).    Radiology Studies: No results found.  Scheduled Meds: . apixaban  5 mg Oral BID  . aspirin EC  81 mg Oral Daily  . cyanocobalamin  1,000 mcg Intramuscular Daily  . diclofenac sodium  2 g Topical QID  . ferrous sulfate  325 mg Oral BID WC  . furosemide  80 mg Intravenous TID  . levothyroxine  175 mcg Oral QAC breakfast  . magnesium oxide  400 mg Oral Daily  . metolazone  2.5 mg Oral Daily  . metoprolol tartrate  12.5 mg Oral BID  . pantoprazole  40 mg Oral Daily  . potassium chloride  40 mEq Oral TID  . sodium chloride flush  3 mL Intravenous Q12H  . spironolactone  25 mg Oral Daily   Continuous Infusions: . sodium chloride       LOS: 7 days   Time spent: 25 minutes.  Tyrone Nine, MD [facility] [email or web address] Password [email or web address] 01/07/2018, 1:28 PM

## 2018-01-07 NOTE — Progress Notes (Signed)
Spoke to patient and family.

## 2018-01-07 NOTE — Progress Notes (Signed)
Physical Therapy Treatment Patient Details Name: Christine Hicks MRN: [id number] DOB: March 02, 1956 Today's Date: 01/07/2018    History of Present Illness Pt is a 62 y.o. female admitted 12/31/17 with c/o SOB and LE edema; worked up for CHF. PMHx: multiple falls, morbid obesity, HTN, dCHF, DVT on Eliquis, GERD, CKD-2, aortic stenosis, previous aortic root replacement and valve replacement (2018), iron deficiency anemia.    PT Comments    Pt with stand by assistance for transfers and gait this visit, family supportive. Ambulating hallway with RW with stable vitals throughout visit. Patient limited due to pain in L arm IV site as she is getting potassium currently. HHPT recs still appropriate at this time.     Follow Up Recommendations  Home health PT;Supervision/Assistance - 24 hour     Equipment Recommendations  None recommended by PT    Recommendations for Other Services       Precautions / Restrictions Precautions Precautions: Fall Restrictions Weight Bearing Restrictions: No    Mobility  Bed Mobility Overal bed mobility: Needs Assistance Bed Mobility: Supine to Sit     Supine to sit: Supervision;HOB elevated        Transfers Overall transfer level: Needs assistance Equipment used: Rolling walker (2 wheeled) Transfers: Sit to/from [employer] Sit to Stand: Supervision Stand pivot transfers: Min guard          Ambulation/Gait Ambulation/Gait assistance: Supervision Gait Distance (Feet): 125 Feet Assistive device: Rolling walker (2 wheeled) Gait Pattern/deviations: Decreased stride length;Step-through pattern;Wide base of support     General Gait Details: pt very slow, steady with RW, chair follow for safety. VSS during visit, pt limited by L IV site pain and unable to utilize RW, took chair back to room due ot pain   Stairs             Wheelchair Mobility    Modified Rankin (Stroke Patients Only)       Balance Overall balance  assessment: Mild deficits observed, not formally tested                                          Cognition Arousal/Alertness: Awake/alert Behavior During Therapy: WFL for tasks assessed/performed Overall Cognitive Status: Within Functional Limits for tasks assessed                                 General Comments: Pt concerned with incontinence due to meds but willing to participate with OT with encouragement      Exercises      General Comments        Pertinent Vitals/Pain Pain Assessment: Faces Faces Pain Scale: Hurts even more Pain Location: L arm IV site likely due to potassium Pain Intervention(s): Limited activity within patient's tolerance;Monitored during session    Home Living                      Prior Function            PT Goals (current goals can now be found in the care plan section) Acute Rehab PT Goals PT Goal Formulation: With patient Time For Goal Achievement: 01/15/18 Potential to Achieve Goals: Good Progress towards PT goals: Progressing toward goals    Frequency    Min 3X/week      PT Plan Current plan remains appropriate  Co-evaluation              AM-PAC PT "6 Clicks" Daily Activity  Outcome Measure  Difficulty turning over in bed (including adjusting bedclothes, sheets and blankets)?: A Little Difficulty moving from lying on back to sitting on the side of the bed? : A Little Difficulty sitting down on and standing up from a chair with arms (e.g., wheelchair, bedside commode, etc,.)?: A Little Help needed moving to and from a bed to chair (including a wheelchair)?: A Little Help needed walking in hospital room?: A Little Help needed climbing 3-5 steps with a railing? : A Lot 6 Click Score: 17    End of Session Equipment Utilized During Treatment: Gait belt Activity Tolerance: Patient tolerated treatment well Patient left: in chair;with call bell/phone within reach Nurse  Communication: Mobility status PT Visit Diagnosis: Unsteadiness on feet (R26.81);Other abnormalities of gait and mobility (R26.89);History of falling (Z91.81)     Time: 1140-1210 PT Time Calculation (min) (ACUTE ONLY): 30 min  Charges:  $Gait Training: 23-37 mins                    G Codes:       Etta GrandchildSean Birdella Sippel, PT, DPT Acute Rehab Services Pager: [phone]     Etta GrandchildSean Moyses Pavey 01/07/2018, 12:11 PM

## 2018-01-08 LAB — BASIC METABOLIC PANEL
Anion gap: 10 (ref 5–15)
Anion gap: 11 (ref 5–15)
BUN: 29 mg/dL — AB (ref 6–20)
BUN: 27 mg/dL — AB (ref 6–20)
CHLORIDE: 94 mmol/L — AB (ref 101–111)
CO2: 31 mmol/L (ref 22–32)
CO2: 34 mmol/L — AB (ref 22–32)
CREATININE: 1.22 mg/dL — AB (ref 0.44–1.00)
Calcium: 9 mg/dL (ref 8.9–10.3)
Calcium: 9.1 mg/dL (ref 8.9–10.3)
Chloride: 92 mmol/L — ABNORMAL LOW (ref 101–111)
Creatinine, Ser: 1.16 mg/dL — ABNORMAL HIGH (ref 0.44–1.00)
GFR calc Af Amer: 53 mL/min — ABNORMAL LOW (ref >60)
GFR calc Af Amer: 57 mL/min — ABNORMAL LOW (ref >60)
GFR calc non Af Amer: 46 mL/min — ABNORMAL LOW (ref >60)
GFR calc non Af Amer: 49 mL/min — ABNORMAL LOW (ref >60)
GLUCOSE: 122 mg/dL — AB (ref 65–99)
GLUCOSE: 148 mg/dL — AB (ref 65–99)
POTASSIUM: 3 mmol/L — AB (ref 3.5–5.1)
Potassium: 3.3 mmol/L — ABNORMAL LOW (ref 3.5–5.1)
SODIUM: 139 mmol/L (ref 135–145)
Sodium: 133 mmol/L — ABNORMAL LOW (ref 135–145)

## 2018-01-08 LAB — CBC
HEMATOCRIT: 34.8 % — AB (ref 36.0–46.0)
HEMOGLOBIN: 10.7 g/dL — AB (ref 12.0–15.0)
MCH: 31.9 pg (ref 26.0–34.0)
MCHC: 30.7 g/dL (ref 30.0–36.0)
MCV: 103.9 fL — ABNORMAL HIGH (ref 78.0–100.0)
Platelets: 253 10*3/uL (ref 150–400)
RBC: 3.35 MIL/uL — ABNORMAL LOW (ref 3.87–5.11)
RDW: 16.3 % — ABNORMAL HIGH (ref 11.5–15.5)
WBC: 5.8 10*3/uL (ref 4.0–10.5)

## 2018-01-08 LAB — PROTIME-INR
INR: 2.11
Prothrombin Time: 23.5 seconds — ABNORMAL HIGH (ref 11.4–15.2)

## 2018-01-08 LAB — MAGNESIUM: Magnesium: 2.2 mg/dL (ref 1.7–2.4)

## 2018-01-08 MED ORDER — METOLAZONE 2.5 MG PO TABS
2.5000 mg | ORAL_TABLET | Freq: Every day | ORAL | Status: DC
  Administered 2018-01-09: 2.5 mg via ORAL
  Filled 2018-01-08: qty 1

## 2018-01-08 MED ORDER — POTASSIUM CHLORIDE CRYS ER 20 MEQ PO TBCR
40.0000 meq | EXTENDED_RELEASE_TABLET | Freq: Four times a day (QID) | ORAL | Status: DC
  Administered 2018-01-09 (×2): 40 meq via ORAL
  Filled 2018-01-08 (×2): qty 2

## 2018-01-08 MED ORDER — APIXABAN 5 MG PO TABS
5.0000 mg | ORAL_TABLET | Freq: Two times a day (BID) | ORAL | Status: DC
  Administered 2018-01-08 – 2018-01-10 (×5): 5 mg via ORAL
  Filled 2018-01-08 (×6): qty 1

## 2018-01-08 MED ORDER — CYANOCOBALAMIN 1000 MCG/ML IJ SOLN: 1000.0000 ug | INTRAMUSCULAR | Status: DC

## 2018-01-08 MED ORDER — POTASSIUM CHLORIDE CRYS ER 20 MEQ PO TBCR
40.0000 meq | EXTENDED_RELEASE_TABLET | Freq: Four times a day (QID) | ORAL | Status: DC
  Administered 2018-01-08 (×2): 40 meq via ORAL
  Filled 2018-01-08 (×3): qty 2

## 2018-01-08 MED ORDER — METOLAZONE 5 MG PO TABS
5.0000 mg | ORAL_TABLET | Freq: Once | ORAL | Status: AC
  Administered 2018-01-08: 5 mg via ORAL
  Filled 2018-01-08: qty 1

## 2018-01-08 MED ORDER — POTASSIUM CHLORIDE 20 MEQ/15ML (10%) PO SOLN
40.0000 meq | Freq: Four times a day (QID) | ORAL | Status: AC
  Administered 2018-01-08 (×3): 40 meq via ORAL
  Filled 2018-01-08 (×2): qty 30

## 2018-01-08 NOTE — Progress Notes (Signed)
PROGRESS NOTE  Christine Hicks  NWG:[id number] DOB: 09/21/1955 DOA: 12/31/2017 PCP: Annita Brod, MD   Brief Narrative: Christine Hicks a 62 y.o.femalewith medical history significant ofmorbid obesity, hypertension,dCHF, DVT on Eliquis, GERD, CKD-2, aortic stenosis,s/p ofaortic root replacement and valve replacement, iron deficiency anemia, who presents with shortness breath and leg edema.  Patient states that she has shortness of breath in the past several days, which has been progressively getting worse. She does not have chest pain. She has dry cough. She also noted worsening bilateral leg edema. Her primary care doctor increased her torsemide dose to 60 mg twice a day without significant improvement. Patient states that she has gained weight by approximately 20 pounds since April. She has generalized weakness.She states that she fell twice accidentally without significant injury. She has bruise on the right arm.No unilateral weakness, numbness or tingling his extremities. No facial droop or slurred speech. Denies nausea, vomiting, diarrhea, abdominal pain, symptoms of UTI.  Assessment & Plan: Principal Problem:   Acute on chronic diastolic heart failure (HCC) Active Problems:   Essential hypertension   DVT, recurrent, lower extremity, chronic, bilateral (HCC)   Iron deficiency anemia   GERD (gastroesophageal reflux disease)   Acute renal failure superimposed on stage 2 chronic kidney disease (HCC)  Acute on chronic HFpEF, RV failure, PAH: EF 65-70%, severe TV regurgitation, increased PASP. - Continue diuresis lasix 80mg  IV TID and daily metolazone. Making slow improvement with wt down another 2 lbs, still 7lbs above wt at last DC. Continue spironolactone, but monitor BP closely, soft this AM. - Continue daily weights, I/O. Cr stable. - Plan for RHC per AHF team once less overloaded, likely 6/17.  Hypokalemia: Resistant to replacement, has been recurring problem. No  GI losses.  - Continue po TID, giving another dose in addition. - Recheck daily with mag  Frequent falls: No focal deficits on exam. Negative head CT.  - Continue PT/OT efforts to address deconditioning. Encouraged to get OOB with assistance. Plan DC w/HH.   Left foot pain: With tissue swelling on exam/XR without fracture/dislocation. Possibly related to previous fall. No evidence of acute gout at this time. - Symptomatic management  Venous insufficiency with stasis dermatitis: Tender erythema worse R > L. Without fever, warmth or leukocytosis, empiric abx were stopped.  - Elevate legs, diurese as above.  - ACE wraps placed 6/14.  AKI on stage II CKD: Cr returned to baseline with diuresis. No proteinuria noted  HTN:  - Metoprolol dose reduced while aggressively diuresing and with bradycardia. 1st deg AVB on ECG 6/13. - Continue to monitor   Tremor: subacute worse with activity, not associated with anxiety. TSH wnl.  - Monitor, follow up as outpatient. Seems to have improved.  Recurrent chronic bilateral LE DVT: U/S was limited but showed no DVTs. - Continue eliquis  Hypothyroidism: TSH 0.487 - Continue synthroid  Vitamin B12 deficiency:  - Give daily injections x1 week, continue weekly x1 month (ordered) then monthly.  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 Cornelius Moras.   Morbid obesity, OHS:  - Nutrition counseling provided  QT prolongation: QTc this AM. 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 - Keep K and Mg replaced as above  DVT prophylaxis: Eliquis Code Status: Full Family Communication: At bedside Disposition Plan: Home once closer to dry weight, pending RHC, likely early next week.   Consultants:   HF team  Procedures:  Echocardiogram 01/01/2018: - Left ventricle: The cavity size was normal. Systolic function was   vigorous. The estimated ejection fraction  was in the range of 65%   to 70%. Wall motion was normal; there were no regional wall   motion abnormalities. There was no evidence of elevated   ventricular filling pressure by Doppler parameters. - Right atrium: The atrium was severely dilated. - Tricuspid valve: There was severe regurgitation. - Pulmonary arteries: Systolic pressure was mildly increased. PA   peak pressure: 39 mm Hg (S).  Antimicrobials:  Keflex 6/9 - 6/11  Subjective: "Better." No dyspnea at rest, has not gotten up in the past day but wants to. Legs wrapped but not very tightly compared to previous times. Denies chest pain. Swelling in legs is improved but not where she has been at their best.  Objective: Vitals:   01/07/18 2130 01/08/18 0533 01/08/18 0556 01/08/18 1213  BP: (!) 98/53 94/68 102/61 (!) 92/56  Pulse: 96 67  90  Resp:  12  20  Temp:  97.9 F (36.6 C)  (!) 97.4 F (36.3 C)  TempSrc:  Oral  Oral  SpO2:  93%  97%  Weight:  119.9 kg (264 lb 6.4 oz)    Height:        Intake/Output Summary (Last 24 hours) at 01/08/2018 1406 Last data filed at 01/08/2018 1217 Gross per 24 hour  Intake 389.83 ml  Output 4150 ml  Net -3760.17 ml   Filed Weights   01/06/18 0610 01/07/18 0419 01/08/18 0533  Weight: 123 kg (271 lb 1.6 oz) 120.9 kg (266 lb 9.6 oz) 119.9 kg (264 lb 6.4 oz)    Gen: Pleasant, obese female in no distress speaking with family Pulm: Nonlabored and clear CV: Regular rate and rhythm. II/VI RUSB SEM. + JVD, 1+ pedal edema. GI: Abdomen soft, non-tender, non-distended, with normoactive bowel sounds. No organomegaly or masses felt. Ext: Warm, no deformities. LE's wrapped in ACE wrap still with pitting edema worse on right. Left foot tenderness is unchanged.  Skin: No rashes, lesions or ulcers Neuro: Alert and oriented. No focal neurological deficits. Psych: Judgement and insight appear normal. Mood & affect appropriate.   Data Reviewed: I have personally reviewed following labs and imaging  studies  CBC: Recent Labs  Lab 01/04/18 0514 01/05/18 0624 01/06/18 0454 01/07/18 0611 01/08/18 0508  WBC 5.7 5.7 4.8 5.5 5.8  HGB 9.8* 10.2* 9.5* 9.9* 10.7*  HCT 32.3* 33.6* 31.2* 32.3* 34.8*  MCV 106.6* 106.3* 104.0* 105.2* 103.9*  PLT 221 232 231 224 253   Basic Metabolic Panel: Recent Labs  Lab 01/02/18 0434 01/03/18 0634 01/04/18 0514 01/05/18 0624 01/06/18 0454 01/06/18 0741 01/06/18 1414 01/07/18 0611 01/08/18 0508  NA 141 142 139 139  --  138 135 136 133*  K 4.0 3.4* 3.1* 3.0*  --  2.8* 3.2* 3.2* 3.3*  CL 106 105 104 100*  --  97* 95* 93* 92*  CO2 26 26 25 29   --  30 30 32 31  GLUCOSE 102* 80 134* 98  --  101* 128* 95 122*  BUN 29* 26* 23* 21*  --  23* 22* 25* 29*  CREATININE 1.43* 1.35* 1.19* 1.04*  --  1.13* 1.18* 1.21* 1.22*  CALCIUM 8.7* 8.6* 8.4* 8.8*  --  8.9 8.6* 8.8* 9.0  MG 2.5* 2.3 2.2 2.2 2.1  --   --   --   --    GFR: Estimated Creatinine Clearance: 61.2 mL/min (A) (by C-G formula based  on SCr of 1.22 mg/dL (H)). Liver Function Tests: Recent Labs  Lab 01/03/18 0634  AST 17  ALT 11*  ALKPHOS 76  BILITOT 1.1  PROT 6.1*  ALBUMIN 2.6*   No results for input(s): LIPASE, AMYLASE in the last 168 hours. No results for input(s): AMMONIA in the last 168 hours. Coagulation Profile: Recent Labs  Lab 01/08/18 0508  INR 2.11   Cardiac Enzymes: No results for input(s): CKTOTAL, CKMB, CKMBINDEX, TROPONINI in the last 168 hours. BNP (last 3 results) No results for input(s): PROBNP in the last 8760 hours. HbA1C: No results for input(s): HGBA1C in the last 72 hours. CBG: No results for input(s): GLUCAP in the last 168 hours. Lipid Profile: No results for input(s): CHOL, HDL, LDLCALC, TRIG, CHOLHDL, LDLDIRECT in the last 72 hours. Thyroid Function Tests: No results for input(s): TSH, T4TOTAL, FREET4, T3FREE, THYROIDAB in the last 72 hours. Anemia Panel: No results for input(s): VITAMINB12, FOLATE, FERRITIN, TIBC, IRON, RETICCTPCT in the last 72  hours. Urine analysis:    Component Value Date/Time   COLORURINE YELLOW 01/01/2018 1222   APPEARANCEUR CLEAR 01/01/2018 1222   LABSPEC 1.009 01/01/2018 1222   PHURINE 6.0 01/01/2018 1222   GLUCOSEU NEGATIVE 01/01/2018 1222   HGBUR SMALL (A) 01/01/2018 1222   BILIRUBINUR NEGATIVE 01/01/2018 1222   KETONESUR NEGATIVE 01/01/2018 1222   PROTEINUR NEGATIVE 01/01/2018 1222   UROBILINOGEN 0.2 06/22/2010 2055   NITRITE NEGATIVE 01/01/2018 1222   LEUKOCYTESUR SMALL (A) 01/01/2018 1222   No results found for this or any previous visit (from the past 240 hour(s)).    Radiology Studies: No results found.  Scheduled Meds: . apixaban  5 mg Oral BID  . aspirin EC  81 mg Oral Daily  . cyanocobalamin  1,000 mcg Intramuscular Daily  . diclofenac sodium  2 g Topical QID  . ferrous sulfate  325 mg Oral BID WC  . furosemide  80 mg Intravenous TID  . levothyroxine  175 mcg Oral QAC breakfast  . magnesium oxide  400 mg Oral Daily  . [START ON 01/09/2018] metolazone  2.5 mg Oral Daily  . metoprolol tartrate  12.5 mg Oral BID  . pantoprazole  40 mg Oral Daily  . potassium chloride  40 mEq Oral QID  . sodium chloride flush  3 mL Intravenous Q12H  . sodium chloride flush  3 mL Intravenous Q12H  . spironolactone  25 mg Oral Daily   Continuous Infusions: . sodium chloride    . sodium chloride 10 mL/hr at 01/08/18 1103  . sodium chloride       LOS: 8 days   Time spent: 25 minutes.  Tyrone Nine, MD [facility] [email or web address] Password [email or web address] 01/08/2018, 2:06 PM

## 2018-01-08 NOTE — Progress Notes (Addendum)
Advanced Heart Failure Rounding Note  PCP-Cardiologist: Lyn RecordsHenry W Smith III, MD   Subjective:    Brisk UOP with 80 mg IV lasix TID + metolazone. -3.2 L and down 2 lbs. Creatinine stable 1.22, K 3.3.  Denies CP, SOB, dizziness, or orthopnea. Feeling much better. Breathing improved. Ambulated in hallway yesterday with no problems. No dizziness or lghtheadedness.  TED hose were too small.  Plans for RHC today.   Objective:   Weight Range: 264 lb 6.4 oz (119.9 kg) Body mass index is 44 kg/m.   Vital Signs:   Temp:  [97.9 F (36.6 C)-98.7 F (37.1 C)] 97.9 F (36.6 C) (06/14 0533) Pulse Rate:  [66-96] 67 (06/14 0533) Resp:  [12-18] 12 (06/14 0533) BP: (91-102)/(53-68) 102/61 (06/14 0556) SpO2:  [93 %-95 %] 93 % (06/14 0533) Weight:  [264 lb 6.4 oz (119.9 kg)] 264 lb 6.4 oz (119.9 kg) (06/14 0533) Last BM Date: 01/07/18  Weight change: Filed Weights   01/06/18 0610 01/07/18 0419 01/08/18 0533  Weight: 271 lb 1.6 oz (123 kg) 266 lb 9.6 oz (120.9 kg) 264 lb 6.4 oz (119.9 kg)    Intake/Output:   Intake/Output Summary (Last 24 hours) at 01/08/2018 0741 Last data filed at 01/08/2018 0604 Gross per 24 hour  Intake 440 ml  Output 3650 ml  Net -3210 ml      Physical Exam    General: No resp difficulty. HEENT: Normal anicteric  Neck: Supple. JVP to jaw. Carotids 2+ bilat; no bruits. No thyromegaly or nodule noted. Cor: PMI nondisplaced. RRR, 2/6 SEM LUSB, pronounced P2 Lungs: CTAB, normal effort. No wheeze  Abdomen: Obese Soft, non-tender, non-distended, no HSM. No bruits or masses. +BS  Extremities: No cyanosis, clubbing, or rash. R and LLE 1-2+ edema, tender to touch Neuro: alert & oriented x 3, cranial nerves grossly intact. moves all 4 extremities w/o difficulty. Affect pleasant    Telemetry   NSR 90s. Personally reviewed.   EKG    NSR 92 bpm with 1st degree AV block, RBBB, QTc 526. Personally reviewed.   Labs    CBC Recent Labs    01/07/18 0611  01/08/18 0508  WBC 5.5 5.8  HGB 9.9* 10.7*  HCT 32.3* 34.8*  MCV 105.2* 103.9*  PLT 224 253   Basic Metabolic Panel Recent Labs    78/29/5605/07/15 0454  01/07/18 0611 01/08/18 0508  NA  --    < > 136 133*  K  --    < > 3.2* 3.3*  CL  --    < > 93* 92*  CO2  --    < > 32 31  GLUCOSE  --    < > 95 122*  BUN  --    < > 25* 29*  CREATININE  --    < > 1.21* 1.22*  CALCIUM  --    < > 8.8* 9.0  MG 2.1  --   --   --    < > = values in this interval not displayed.   Liver Function Tests No results for input(s): AST, ALT, ALKPHOS, BILITOT, PROT, ALBUMIN in the last 72 hours. No results for input(s): LIPASE, AMYLASE in the last 72 hours. Cardiac Enzymes No results for input(s): CKTOTAL, CKMB, CKMBINDEX, TROPONINI in the last 72 hours.  BNP: BNP (last 3 results) Recent Labs    11/09/17 1213 11/18/17 0454 12/31/17 1524  BNP 316.0* 191.3* 408.6*    ProBNP (last 3 results) No results for input(s): PROBNP in  the last 8760 hours.   D-Dimer No results for input(s): DDIMER in the last 72 hours. Hemoglobin A1C No results for input(s): HGBA1C in the last 72 hours. Fasting Lipid Panel No results for input(s): CHOL, HDL, LDLCALC, TRIG, CHOLHDL, LDLDIRECT in the last 72 hours. Thyroid Function Tests No results for input(s): TSH, T4TOTAL, T3FREE, THYROIDAB in the last 72 hours.  Invalid input(s): FREET3  Other results:   Imaging    No results found.   Medications:     Scheduled Medications: . aspirin EC  81 mg Oral Daily  . cyanocobalamin  1,000 mcg Intramuscular Daily  . diclofenac sodium  2 g Topical QID  . ferrous sulfate  325 mg Oral BID WC  . furosemide  80 mg Intravenous TID  . levothyroxine  175 mcg Oral QAC breakfast  . magnesium oxide  400 mg Oral Daily  . metolazone  2.5 mg Oral Daily  . metoprolol tartrate  12.5 mg Oral BID  . pantoprazole  40 mg Oral Daily  . potassium chloride  40 mEq Oral TID  . sodium chloride flush  3 mL Intravenous Q12H  . sodium  chloride flush  3 mL Intravenous Q12H  . spironolactone  25 mg Oral Daily    Infusions: . sodium chloride    . sodium chloride 10 mL/hr at 01/08/18 0604  . 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  Admitted with marked volume overload.   Assessment/Plan   1. A/C Diastolic Heart Failure. Suspect PAH/RV failure due to OHS -ECHO 01/01/2018 EF 65-70%. Marked volume overload. Weight  up >20 pounds from recent discharge (discharge weight 257 pounds)  - Volume status improving, but remains elevated.  - Continue 80 mg IV lasix TID + 2.5 mg metolazone daily - Continue spiro 25 mg daily. Creatinine 1.22, K 3.3 - TED hose too small. Apply unna boots.  - Still volume overloaded on exam. Will give 5 mg metolazone this am and reassess closer to noon if reasonable to do RHC today.    2. Suspected OSA - Sleep study completed 12/29/2017. No change.   3. Morbid Obesity  - Body mass index is 44 kg/m. - Needs to lose weight.   4. H/O DVT/PE - On eliquis. No change.   5. H/O of Congenital Bicuspid Valve w/ Severe Stenosis: s/p bioprosthetic aortic valve replacement with aortic root placement, per Dr. Cornelius Moras June 2018. No change.   6. Hypokalemia.  - K 3.3. Will supp.   7. Prolonged QT - QTc 526 on EKG yesterday. Avoid QT prolonging drugs.  8. Deconditioning - PT recommending HHPT and 24 hour supervision  Medication concerns reviewed with patient and pharmacy team. Barriers identified: last admit she was noncompliant with medications. May need HF Paramedicine when she is discharged. Geraldine Contras  has called to schedule appointment with her.   Length of Stay: 8  Alford Highland, NP  01/08/2018, 7:41 AM  Advanced Heart Failure Team Pager [phone] (M-F; 7a - 4p)  Please contact [facility] for night-coverage after hours (4p -7a ) and weekends on [email or web address]  Patient seen and examined with the above-signed Advanced Practice Provider and/or Housestaff.  I personally reviewed laboratory data, imaging studies and relevant notes. I independently examined the patient and formulated the important aspects of the plan. I have edited the note to reflect any of my changes or salient points. I have personally discussed the plan with the patient and/or family.  Volume status much improced with IV diuresis but still overloaded. Renal function and bicarb stable. Will need RHC when fully. Wil reassess later today but I suspect may be better to wait until Monday.   Supp K.   Arvilla Meres, MD  8:16 AM

## 2018-01-08 NOTE — Progress Notes (Signed)
Orthopedic Tech Progress Note Patient Details:  Christine Hicks 06/10/1956 [id number]  Ortho Devices Type of Ortho Device: Radio broadcast assistantUnna boot Ortho Device/Splint Location: bilateral Ortho Device/Splint Interventions: Application   Post Interventions Patient Tolerated: Well Instructions Provided: Care of device   Nikki DomCrawford, [name] 01/08/2018, 8:59 AM

## 2018-01-08 NOTE — Care Management Important Message (Signed)
Important Message  Patient Details  Name: Christine Hicks MRN: [id number] Date of Birth: 12/25/1955   Medicare Important Message Given:  Yes    Tonae Livolsi P Jaion Lagrange 01/08/2018, 3:40 PM

## 2018-01-09 LAB — BASIC METABOLIC PANEL
Anion gap: 10 (ref 5–15)
Anion gap: 13 (ref 5–15)
BUN: 33 mg/dL — AB (ref 6–20)
BUN: 31 mg/dL — AB (ref 6–20)
CHLORIDE: 94 mmol/L — AB (ref 101–111)
CHLORIDE: 94 mmol/L — AB (ref 101–111)
CO2: 31 mmol/L (ref 22–32)
CO2: 28 mmol/L (ref 22–32)
Calcium: 9 mg/dL (ref 8.9–10.3)
Calcium: 8.7 mg/dL — ABNORMAL LOW (ref 8.9–10.3)
Creatinine, Ser: 1.26 mg/dL — ABNORMAL HIGH (ref 0.44–1.00)
Creatinine, Ser: 1.56 mg/dL — ABNORMAL HIGH (ref 0.44–1.00)
GFR calc Af Amer: 51 mL/min — ABNORMAL LOW (ref >60)
GFR calc Af Amer: 40 mL/min — ABNORMAL LOW (ref >60)
GFR calc non Af Amer: 44 mL/min — ABNORMAL LOW (ref >60)
GFR calc non Af Amer: 34 mL/min — ABNORMAL LOW (ref >60)
GLUCOSE: 116 mg/dL — AB (ref 65–99)
GLUCOSE: 194 mg/dL — AB (ref 65–99)
POTASSIUM: 3.4 mmol/L — AB (ref 3.5–5.1)
Potassium: 4.9 mmol/L (ref 3.5–5.1)
Sodium: 135 mmol/L (ref 135–145)
Sodium: 135 mmol/L (ref 135–145)

## 2018-01-09 LAB — CBC
HEMATOCRIT: 34.8 % — AB (ref 36.0–46.0)
HEMOGLOBIN: 10.7 g/dL — AB (ref 12.0–15.0)
MCH: 32.3 pg (ref 26.0–34.0)
MCHC: 30.7 g/dL (ref 30.0–36.0)
MCV: 105.1 fL — ABNORMAL HIGH (ref 78.0–100.0)
Platelets: 264 10*3/uL (ref 150–400)
RBC: 3.31 MIL/uL — ABNORMAL LOW (ref 3.87–5.11)
RDW: 16.8 % — ABNORMAL HIGH (ref 11.5–15.5)
WBC: 6.3 10*3/uL (ref 4.0–10.5)

## 2018-01-09 LAB — MAGNESIUM: Magnesium: 2.2 mg/dL (ref 1.7–2.4)

## 2018-01-09 MED ORDER — POTASSIUM CHLORIDE CRYS ER 20 MEQ PO TBCR
40.0000 meq | EXTENDED_RELEASE_TABLET | Freq: Three times a day (TID) | ORAL | Status: DC
  Administered 2018-01-09 – 2018-01-10 (×4): 40 meq via ORAL
  Filled 2018-01-09 (×4): qty 2

## 2018-01-09 MED ORDER — METOLAZONE 2.5 MG PO TABS
2.5000 mg | ORAL_TABLET | Freq: Two times a day (BID) | ORAL | Status: DC
  Administered 2018-01-09 – 2018-01-11 (×5): 2.5 mg via ORAL
  Filled 2018-01-09 (×5): qty 1

## 2018-01-09 NOTE — Progress Notes (Signed)
PROGRESS NOTE  Christine Hicks  WJX:[id number] DOB: 06-04-1956 DOA: 12/31/2017 PCP: Annita Brod, MD   Brief Narrative: Christine Hicks a 62 y.o.femalewith medical history significant ofmorbid obesity, hypertension,dCHF, DVT on Eliquis, GERD, CKD-2, aortic stenosis,s/p ofaortic root replacement and valve replacement, iron deficiency anemia, who presents with shortness breath and leg edema.  Patient states that she has shortness of breath in the past several days, which has been progressively getting worse. She does not have chest pain. She has dry cough. She also noted worsening bilateral leg edema. Her primary care doctor increased her torsemide dose to 60 mg twice a day without significant improvement. Patient states that she has gained weight by approximately 20 pounds since April. She has generalized weakness.She states that she fell twice accidentally without significant injury. She has bruise on the right arm.No unilateral weakness, numbness or tingling his extremities. No facial droop or slurred speech. Denies nausea, vomiting, diarrhea, abdominal pain, symptoms of UTI.  Assessment & Plan: Principal Problem:   Acute on chronic diastolic heart failure (HCC) Active Problems:   Essential hypertension   DVT, recurrent, lower extremity, chronic, bilateral (HCC)   Iron deficiency anemia   GERD (gastroesophageal reflux disease)   Acute renal failure superimposed on stage 2 chronic kidney disease (HCC)  Acute on chronic HFpEF, RV failure, PAH: EF 65-70%, severe TV regurgitation, increased PASP. - Continuing diuresis per HF team: lasix 80mg  IV TID and increasing metolazone to 2.5mg  BID. Despite ongoing UOP, wt is stable today, remains above EDW.  - Continue daily weights, I/O. Cr stable. - Plan for RHC per AHF team once less overloaded, likely 6/17.  Hypokalemia: Resistant to replacement, has been recurring problem. No GI losses.  - Resolved abruptly today ? No visible  hemolysis noted, will repeat this PM. Possibly effect of increased spironolactone. May need to hold K.   Frequent falls: No focal deficits on exam. Negative head CT.  - Continue PT/OT efforts to address deconditioning. Encouraged to get OOB with assistance. Plan DC w/HH.   Left foot pain: With tissue swelling on exam/XR without fracture/dislocation. Possibly related to previous fall. No evidence of acute gout at this time. - Symptomatic management. This is stable.  Venous insufficiency with stasis dermatitis: Tender erythema worse R > L. Without fever, warmth or leukocytosis, empiric abx were stopped.  - Elevate legs, diurese as above.  - ACE wraps placed 6/14.  AKI on stage II CKD: Cr returned to baseline with diuresis. No proteinuria noted.  HTN:  - Metoprolol dose reduced while aggressively diuresing and with bradycardia. 1st deg AVB on ECG 6/13. - Continue to monitor   Tremor: subacute worse with activity, not associated with anxiety. TSH wnl.  - Monitor, follow up as outpatient. Seems to have improved.  Recurrent chronic bilateral LE DVT: U/S was limited but showed no acute DVTs. - Continue eliquis  Hypothyroidism: TSH 0.487 - Continue synthroid  Vitamin B12 deficiency:  - Given daily injections x1 week, continuing weekly x1 month (ordered) then plan monthly.  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 Cornelius Moras.   Morbid obesity, OHS: BMI >40.  - Nutrition counseling provided  QT prolongation:  - Stable telemetry - Keep K and Mg replaced as above  DVT prophylaxis: Eliquis Code Status: Full Family Communication: At bedside Disposition Plan: Home once closer to dry weight, pending RHC, likely early this coming week.   Consultants:   HF team  Procedures:   Echocardiogram 01/01/2018: - Left  ventricle: The cavity size was normal. Systolic function was   vigorous. The estimated ejection fraction was in the range of 65%    to 70%. Wall motion was normal; there were no regional wall   motion abnormalities. There was no evidence of elevated   ventricular filling pressure by Doppler parameters. - Right atrium: The atrium was severely dilated. - Tricuspid valve: There was severe regurgitation. - Pulmonary arteries: Systolic pressure was mildly increased. PA   peak pressure: 39 mm Hg (S).  Antimicrobials:  Keflex 6/9 - 6/11  Subjective: Reports continued good urine output and feels like she's progressing. No chest pain or palpitations. Dyspnea is improved   Objective: Vitals:   01/08/18 2203 01/09/18 0526 01/09/18 0903 01/09/18 1212  BP: 90/68 99/61 94/62  (!) 96/49  Pulse: 83 79 71 91  Resp:  12  20  Temp:  98.5 F (36.9 C)  98.5 F (36.9 C)  TempSrc:  Oral  Oral  SpO2: 96% 94%  94%  Weight:  119.7 kg (264 lb)    Height:        Intake/Output Summary (Last 24 hours) at 01/09/2018 1512 Last data filed at 01/09/2018 1415 Gross per 24 hour  Intake 1080 ml  Output 4301 ml  Net -3221 ml   Filed Weights   01/07/18 0419 01/08/18 0533 01/09/18 0526  Weight: 120.9 kg (266 lb 9.6 oz) 119.9 kg (264 lb 6.4 oz) 119.7 kg (264 lb)   Gen: Pleasant, obese female in no distress Pulm: Nonlabored breathing room air. Clear anterolaterally. CV: Regular rate and rhythm. NoII/VI early systolic murmur at USB. No rub, or gallop. + JVD, + pitting LE  dependent edema. GI: Abdomen soft, non-tender, non-distended, with normoactive bowel sounds.  Ext: Warm, no deformities Skin: No new rashes, lesions or ulcers on visualized skin.  Neuro: Alert and oriented. No focal neurological deficits. Psych: Judgement and insight appear normal. Mood euthymic & affect congruent. Behavior is appropriate.    Data Reviewed: I have personally reviewed following labs and imaging studies  CBC: Recent Labs  Lab 01/05/18 0624 01/06/18 0454 01/07/18 0611 01/08/18 0508 01/09/18 0416  WBC 5.7 4.8 5.5 5.8 6.3  HGB 10.2* 9.5* 9.9* 10.7*  10.7*  HCT 33.6* 31.2* 32.3* 34.8* 34.8*  MCV 106.3* 104.0* 105.2* 103.9* 105.1*  PLT 232 231 224 253 264   Basic Metabolic Panel: Recent Labs  Lab 01/04/18 0514 01/05/18 0624 01/06/18 0454  01/06/18 1414 01/07/18 0611 01/08/18 0508 01/08/18 1414 01/08/18 1417 01/09/18 0416  NA 139 139  --    < > 135 136 133*  --  139 135  K 3.1* 3.0*  --    < > 3.2* 3.2* 3.3*  --  3.0* 4.9  CL 104 100*  --    < > 95* 93* 92*  --  94* 94*  CO2 25 29  --    < > 30 32 31  --  34* 31  GLUCOSE 134* 98  --    < > 128* 95 122*  --  148* 116*  BUN 23* 21*  --    < > 22* 25* 29*  --  27* 33*  CREATININE 1.19* 1.04*  --    < > 1.18* 1.21* 1.22*  --  1.16* 1.26*  CALCIUM 8.4* 8.8*  --    < > 8.6* 8.8* 9.0  --  9.1 9.0  MG 2.2 2.2 2.1  --   --   --   --  2.2  --  2.2   < > = values in this interval not displayed.   GFR: Estimated Creatinine Clearance: 59.2 mL/min (A) (by C-G formula based on SCr of 1.26 mg/dL (H)). Liver Function Tests: Recent Labs  Lab 01/03/18 0634  AST 17  ALT 11*  ALKPHOS 76  BILITOT 1.1  PROT 6.1*  ALBUMIN 2.6*   No results for input(s): LIPASE, AMYLASE in the last 168 hours. No results for input(s): AMMONIA in the last 168 hours. Coagulation Profile: Recent Labs  Lab 01/08/18 0508  INR 2.11   Cardiac Enzymes: No results for input(s): CKTOTAL, CKMB, CKMBINDEX, TROPONINI in the last 168 hours. BNP (last 3 results) No results for input(s): PROBNP in the last 8760 hours. HbA1C: No results for input(s): HGBA1C in the last 72 hours. CBG: No results for input(s): GLUCAP in the last 168 hours. Lipid Profile: No results for input(s): CHOL, HDL, LDLCALC, TRIG, CHOLHDL, LDLDIRECT in the last 72 hours. Thyroid Function Tests: No results for input(s): TSH, T4TOTAL, FREET4, T3FREE, THYROIDAB in the last 72 hours. Anemia Panel: No results for input(s): VITAMINB12, FOLATE, FERRITIN, TIBC, IRON, RETICCTPCT in the last 72 hours. Urine analysis:    Component Value Date/Time     COLORURINE YELLOW 01/01/2018 1222   APPEARANCEUR CLEAR 01/01/2018 1222   LABSPEC 1.009 01/01/2018 1222   PHURINE 6.0 01/01/2018 1222   GLUCOSEU NEGATIVE 01/01/2018 1222   HGBUR SMALL (A) 01/01/2018 1222   BILIRUBINUR NEGATIVE 01/01/2018 1222   KETONESUR NEGATIVE 01/01/2018 1222   PROTEINUR NEGATIVE 01/01/2018 1222   UROBILINOGEN 0.2 06/22/2010 2055   NITRITE NEGATIVE 01/01/2018 1222   LEUKOCYTESUR SMALL (A) 01/01/2018 1222   No results found for this or any previous visit (from the past 240 hour(s)).    Radiology Studies: No results found.  Scheduled Meds: . apixaban  5 mg Oral BID  . aspirin EC  81 mg Oral Daily  . [START ON 01/15/2018] cyanocobalamin  1,000 mcg Intramuscular Weekly  . diclofenac sodium  2 g Topical QID  . ferrous sulfate  325 mg Oral BID WC  . furosemide  80 mg Intravenous TID  . levothyroxine  175 mcg Oral QAC breakfast  . magnesium oxide  400 mg Oral Daily  . metolazone  2.5 mg Oral BID  . metoprolol tartrate  12.5 mg Oral BID  . pantoprazole  40 mg Oral Daily  . sodium chloride flush  3 mL Intravenous Q12H  . sodium chloride flush  3 mL Intravenous Q12H  . spironolactone  25 mg Oral Daily   Continuous Infusions: . sodium chloride    . sodium chloride Stopped (01/08/18 1900)  . sodium chloride       LOS: 9 days   Time spent: 25 minutes.  Tyrone Nineyan B Montie Gelardi, MD [facility] [email or web address] Password [email or web address] 01/09/2018, 3:12 PM

## 2018-01-09 NOTE — Progress Notes (Signed)
Patient insists on using external catheter during the night. Educated patient about importance of getting up and moving especially since she is already getting moisture associated skin damage in the right groin/private area. [facility]  Will continue to monitor.    Brittne Kawasaki, RN

## 2018-01-09 NOTE — Progress Notes (Signed)
Advanced Heart Failure Rounding Note  PCP-Cardiologist: Lesleigh Noe, MD   Subjective:    Continues with brisk UOP on 80 mg IV lasix TID + metolazone.   - 3.4L but weight unchanged. Creatinine stable.   Denies CP, sob or orthopnea. Able to ambulate halls. Says she is not drinking a lot.    Objective:   Weight Range: 264 lb (119.7 kg) Body mass index is 43.93 kg/m.   Vital Signs:   Temp:  [97.4 F (36.3 C)-98.5 F (36.9 C)] 98.5 F (36.9 C) (06/15 0526) Pulse Rate:  [71-90] 71 (06/15 0903) Resp:  [12-20] 12 (06/15 0526) BP: (89-99)/(56-68) 94/62 (06/15 0903) SpO2:  [94 %-98 %] 94 % (06/15 0526) Weight:  [264 lb (119.7 kg)] 264 lb (119.7 kg) (06/15 0526) Last BM Date: 01/08/18  Weight change: Filed Weights   01/07/18 0419 01/08/18 0533 01/09/18 0526  Weight: 266 lb 9.6 oz (120.9 kg) 264 lb 6.4 oz (119.9 kg) 264 lb (119.7 kg)    Intake/Output:   Intake/Output Summary (Last 24 hours) at 01/09/2018 1158 Last data filed at 01/09/2018 0914 Gross per 24 hour  Intake 1080 ml  Output 4101 ml  Net -3021 ml      Physical Exam    General:  Lying In bed  No resp difficulty HEENT: normal Neck: supple. no JVD. Carotids 2+ bilat; no bruits. No lymphadenopathy or thryomegaly appreciated. Cor: PMI nondisplaced. Regular rate & rhythm. 2/6 TR Lungs: clear Abdomen: obese soft, nontender, nondistended. No hepatosplenomegaly. No bruits or masses. Good bowel sounds. Extremities: no cyanosis, clubbing, rash, 2+ edema + UNNA boots Neuro: alert & orientedx3, cranial nerves grossly intact. moves all 4 extremities w/o difficulty. Affect pleasant   Telemetry   NSR 70s. Personally reviewed.   Labs    CBC Recent Labs    01/08/18 0508 01/09/18 0416  WBC 5.8 6.3  HGB 10.7* 10.7*  HCT 34.8* 34.8*  MCV 103.9* 105.1*  PLT 253 264   Basic Metabolic Panel Recent Labs    16/10/96 1414 01/08/18 1417 01/09/18 0416  NA  --  139 135  K  --  3.0* 4.9  CL  --  94* 94*    CO2  --  34* 31  GLUCOSE  --  148* 116*  BUN  --  27* 33*  CREATININE  --  1.16* 1.26*  CALCIUM  --  9.1 9.0  MG 2.2  --  2.2   Liver Function Tests No results for input(s): AST, ALT, ALKPHOS, BILITOT, PROT, ALBUMIN in the last 72 hours. No results for input(s): LIPASE, AMYLASE in the last 72 hours. Cardiac Enzymes No results for input(s): CKTOTAL, CKMB, CKMBINDEX, TROPONINI in the last 72 hours.  BNP: BNP (last 3 results) Recent Labs    11/09/17 1213 11/18/17 0454 12/31/17 1524  BNP 316.0* 191.3* 408.6*    ProBNP (last 3 results) No results for input(s): PROBNP in the last 8760 hours.   D-Dimer No results for input(s): DDIMER in the last 72 hours. Hemoglobin A1C No results for input(s): HGBA1C in the last 72 hours. Fasting Lipid Panel No results for input(s): CHOL, HDL, LDLCALC, TRIG, CHOLHDL, LDLDIRECT in the last 72 hours. Thyroid Function Tests No results for input(s): TSH, T4TOTAL, T3FREE, THYROIDAB in the last 72 hours.  Invalid input(s): FREET3  Other results:   Imaging    No results found.   Medications:     Scheduled Medications: . apixaban  5 mg Oral BID  . aspirin EC  81 mg Oral Daily  . [START ON 01/15/2018] cyanocobalamin  1,000 mcg Intramuscular Weekly  . diclofenac sodium  2 g Topical QID  . ferrous sulfate  325 mg Oral BID WC  . furosemide  80 mg Intravenous TID  . levothyroxine  175 mcg Oral QAC breakfast  . magnesium oxide  400 mg Oral Daily  . metolazone  2.5 mg Oral Daily  . metoprolol tartrate  12.5 mg Oral BID  . pantoprazole  40 mg Oral Daily  . potassium chloride  40 mEq Oral QID  . sodium chloride flush  3 mL Intravenous Q12H  . sodium chloride flush  3 mL Intravenous Q12H  . spironolactone  25 mg Oral Daily    Infusions: . sodium chloride    . sodium chloride Stopped (01/08/18 1900)  . sodium chloride      PRN Medications: sodium chloride, sodium chloride, acetaminophen, albuterol, HYDROcodone-acetaminophen,  hydrOXYzine, sodium chloride flush, sodium chloride flush, zolpidem    Patient Profile   [name] is a 62 year old with a history of congenital bicuspid aortic valve s/p porcine AVR + aortic root replacement 2018, HTN, hypothyroidism, OSA, morbid obesity, DVT/PE  on chronic eliquis, and chronic diastolic heart failure.  Admitted with marked volume overload.   Assessment/Plan   1. A/C Diastolic Heart Failure. Suspect PAH/RV failure due to OHS -ECHO 01/01/2018 EF 65-70%.(discharge weight 257 pounds)  - Volume status improving, but remains elevated. Weight not dropping as fast as I would expect despite excellent urine output.   - Continue 80 mg IV lasix TID. Increase metolazone to 2.5 bid - Counseled on need to limit fluid intake - Continue spiro 25 mg daily. Creatinine 1.29, K 4.9 - Continue UNNA boots  - Plan RHC when fully diuresed.    2. Suspected OSA - Sleep study completed 12/29/2017. No change.   3. Morbid Obesity  - Body mass index is 43.93 kg/m. - Needs to lose weight.   4. H/O DVT/PE - On eliquis. No change.   5. H/O of Congenital Bicuspid Valve w/ Severe Stenosis: s/p bioprosthetic aortic valve replacement with aortic root placement, per Dr. Cornelius Moraswen June 2018. No change.   6. Hypokalemia.  - K 4.9. Hold K supp.   7. Prolonged QT - QTc 526 on EKG yesterday. Avoid QT prolonging drugs.  8. Deconditioning - PT recommending HHPT and 24 hour supervision  Medication concerns reviewed with patient and pharmacy team. Barriers identified: last admit she was noncompliant with medications. May need HF Paramedicine when she is discharged. Christine Hicks has called to schedule appointment with her.   Length of Stay: 9  Christine Meresaniel Bensimhon, MD  01/09/2018, 11:58 AM  Advanced Heart Failure Team Pager [phone] (M-F; 7a - 4p)  Please contact [facility] for night-coverage after hours (4p -7a ) and weekends on [email or web address]

## 2018-01-10 LAB — BASIC METABOLIC PANEL
Anion gap: 10 (ref 5–15)
BUN: 31 mg/dL — AB (ref 6–20)
CO2: 30 mmol/L (ref 22–32)
CREATININE: 1.36 mg/dL — AB (ref 0.44–1.00)
Calcium: 9 mg/dL (ref 8.9–10.3)
Chloride: 95 mmol/L — ABNORMAL LOW (ref 101–111)
GFR calc Af Amer: 47 mL/min — ABNORMAL LOW (ref >60)
GFR, EST NON AFRICAN AMERICAN: 40 mL/min — AB (ref >60)
GLUCOSE: 111 mg/dL — AB (ref 65–99)
Potassium: 3.3 mmol/L — ABNORMAL LOW (ref 3.5–5.1)
Sodium: 135 mmol/L (ref 135–145)

## 2018-01-10 LAB — CBC
HCT: 36.4 % (ref 36.0–46.0)
Hemoglobin: 11.1 g/dL — ABNORMAL LOW (ref 12.0–15.0)
MCH: 31.9 pg (ref 26.0–34.0)
MCHC: 30.5 g/dL (ref 30.0–36.0)
MCV: 104.6 fL — AB (ref 78.0–100.0)
PLATELETS: 254 10*3/uL (ref 150–400)
RBC: 3.48 MIL/uL — ABNORMAL LOW (ref 3.87–5.11)
RDW: 17 % — ABNORMAL HIGH (ref 11.5–15.5)
WBC: 5.7 10*3/uL (ref 4.0–10.5)

## 2018-01-10 LAB — MAGNESIUM: Magnesium: 2.2 mg/dL (ref 1.7–2.4)

## 2018-01-10 MED ORDER — POTASSIUM CHLORIDE CRYS ER 20 MEQ PO TBCR
40.0000 meq | EXTENDED_RELEASE_TABLET | Freq: Once | ORAL | Status: AC
  Administered 2018-01-10: 40 meq via ORAL
  Filled 2018-01-10: qty 2

## 2018-01-10 MED ORDER — MIDODRINE HCL 5 MG PO TABS
2.5000 mg | ORAL_TABLET | Freq: Three times a day (TID) | ORAL | Status: DC
  Administered 2018-01-10 – 2018-01-14 (×13): 2.5 mg via ORAL
  Filled 2018-01-10 (×13): qty 1

## 2018-01-10 NOTE — Progress Notes (Signed)
Pharmacy called about Eliquis being held today evening and was not sure about the AM dose, MD Jarvis NewcomerGrunz was made aware and he said we can hold the AM dose. Reason: patient going for a cath tomorrow.

## 2018-01-10 NOTE — Progress Notes (Signed)
PROGRESS NOTE  Christine Hicks  ZOX:[id number] DOB: 1955/11/23 DOA: 12/31/2017 PCP: Annita Brod, MD   Brief Narrative: Christine Hicks a 62 y.o.femalewith medical history significant ofmorbid obesity, hypertension,dCHF, DVT on Eliquis, GERD, CKD-2, aortic stenosis,s/p ofaortic root replacement and valve replacement, iron deficiency anemia, who presents with shortness breath and leg edema.  Patient states that she has shortness of breath in the past several days, which has been progressively getting worse. She does not have chest pain. She has dry cough. She also noted worsening bilateral leg edema. Her primary care doctor increased her torsemide dose to 60 mg twice a day without significant improvement. Patient states that she has gained weight by approximately 20 pounds since April. She has generalized weakness.She states that she fell twice accidentally without significant injury. She has bruise on the right arm.No unilateral weakness, numbness or tingling his extremities. No facial droop or slurred speech. Denies nausea, vomiting, diarrhea, abdominal pain, symptoms of UTI.  Assessment & Plan: Principal Problem:   Acute on chronic diastolic heart failure (HCC) Active Problems:   Essential hypertension   DVT, recurrent, lower extremity, chronic, bilateral (HCC)   Iron deficiency anemia   GERD (gastroesophageal reflux disease)   Acute renal failure superimposed on stage 2 chronic kidney disease (HCC)  Acute on chronic HFpEF, RV failure, PAH: EF 65-70%, severe TV regurgitation, increased PASP. - Continuing to have significant diuresis (4.5L out/24hrs). On lasix 80mg  IV TID and increasing metolazone to 2.5mg  BID. Has reached previous EDW. Remains grossly overloaded peripherally. RHC tomorrow for better assessment.  - Continue daily weights, I/O. Cr stable.  Hypokalemia:  - Continue replacement. Mg ok.  Frequent falls: No focal deficits on exam. Negative head CT.  -  Continue PT/OT efforts to address deconditioning. Encouraged to get OOB with assistance. Plan DC w/HH.   Left foot/ankle pain: With tissue swelling on exam/XR without fracture/dislocation. Possibly related to previous fall. Extending to entire leg consistent with swelling as etiology. LE U/S showed no evidence of DVT, though some portions of femoral veins not visualized. Continue on anticoagulation regardless.  - A bit more focally tender in the ankle today, in setting of significant diuresis, agree with empiric gout treatment. If uric acid low, could stop prednisone.   Venous insufficiency with stasis dermatitis: Tender erythema worse R > L. Without fever, warmth or leukocytosis, empiric abx were stopped.  - Elevate legs, diurese as above.  - Unna boots 6/14.   AKI on stage II CKD: Cr returned to baseline with diuresis. No proteinuria noted. - Monitor closely w/aggressive diuresis.   HTN:  - Metoprolol dose reduced while aggressively diuresing and with bradycardia. 1st deg AVB on ECG 6/13. Pressures soft. Defer to cardiology. - Continue to monitor   Tremor: subacute worse with activity, not associated with anxiety. TSH wnl.  - Monitor, follow up as outpatient. Seems to have improved.  Recurrent chronic bilateral LE DVT: U/S was limited but showed no acute DVTs. - Continue eliquis  Hypothyroidism: TSH 0.487 - Continue synthroid  Vitamin B12 deficiency:  - Given daily injections x1 week, continuing weekly x1 month (ordered) then plan monthly.  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 Cornelius Moras.   Morbid obesity, OHS: BMI >40.  - Nutrition counseling provided  QT prolongation:  - Stable telemetry - Keep K and Mg replaced as above  DVT prophylaxis: Eliquis (hold tonight for RHC in AM) Code Status: Full Family Communication: Family, Son at bedside Disposition Plan: Home  once euvolemic. RHC 6/17.   Consultants:   HF  team  Procedures:   Echocardiogram 01/01/2018: - Left ventricle: The cavity size was normal. Systolic function was   vigorous. The estimated ejection fraction was in the range of 65%   to 70%. Wall motion was normal; there were no regional wall   motion abnormalities. There was no evidence of elevated   ventricular filling pressure by Doppler parameters. - Right atrium: The atrium was severely dilated. - Tricuspid valve: There was severe regurgitation. - Pulmonary arteries: Systolic pressure was mildly increased. PA   peak pressure: 39 mm Hg (S).  Antimicrobials:  Keflex 6/9 - 6/11  Subjective: Back to estimated dry weight and getting around easier. Left ankle pain is worsening though swelling has significantly improved in legs. No chest pain or dyspnea.  Objective: Vitals:   01/10/18 0533 01/10/18 0558 01/10/18 0859 01/10/18 1128  BP: (!) 95/48 96/68 98/62  (!) 93/57  Pulse: 94  66 (!) 58  Resp: 14   17  Temp: 99.3 F (37.4 C)   97.6 F (36.4 C)  TempSrc: Oral   Oral  SpO2: 93%   100%  Weight: 117.1 kg (258 lb 3.2 oz)     Height:        Intake/Output Summary (Last 24 hours) at 01/10/2018 1247 Last data filed at 01/10/2018 1215 Gross per 24 hour  Intake 1490 ml  Output 5700 ml  Net -4210 ml   Filed Weights   01/08/18 0533 01/09/18 0526 01/10/18 0533  Weight: 119.9 kg (264 lb 6.4 oz) 119.7 kg (264 lb) 117.1 kg (258 lb 3.2 oz)   Gen: Pleasant obese female in chair with feet elevated Pulm: Nonlabored breathing room air. Clear. CV: Regular rate and rhythm. No murmur, rub, or gallop. +JVD, 1+ LE dependent edema. GI: Abdomen soft, non-tender, non-distended, with normoactive bowel sounds.  Ext: Unna boots bilaterally with toes full AROM, cap refill brisk, warm, sensation normal. LE's diffusely tender to palpation with new increased tenderness worst at left ankle. Diminished AROM due to pain.  Skin: No new rashes or wounds. Neuro: Alert and oriented. No focal neurological  deficits. Psych: Judgement and insight appear fair. Mood euthymic & affect congruent. Behavior is appropriate.    Data Reviewed: I have personally reviewed following labs and imaging studies  CBC: Recent Labs  Lab 01/06/18 0454 01/07/18 0611 01/08/18 0508 01/09/18 0416 01/10/18 0629  WBC 4.8 5.5 5.8 6.3 5.7  HGB 9.5* 9.9* 10.7* 10.7* 11.1*  HCT 31.2* 32.3* 34.8* 34.8* 36.4  MCV 104.0* 105.2* 103.9* 105.1* 104.6*  PLT 231 224 253 264 254   Basic Metabolic Panel: Recent Labs  Lab 01/05/18 0624 01/06/18 0454  01/08/18 0508 01/08/18 1414 01/08/18 1417 01/09/18 0416 01/09/18 1455 01/10/18 0629  NA 139  --    < > 133*  --  139 135 135 135  K 3.0*  --    < > 3.3*  --  3.0* 4.9 3.4* 3.3*  CL 100*  --    < > 92*  --  94* 94* 94* 95*  CO2 29  --    < > 31  --  34* 31 28 30   GLUCOSE 98  --    < > 122*  --  148* 116* 194* 111*  BUN 21*  --    < > 29*  --  27* 33* 31* 31*  CREATININE 1.04*  --    < > 1.22*  --  1.16* 1.26* 1.56* 1.36*  CALCIUM 8.8*  --    < > 9.0  --  9.1 9.0 8.7* 9.0  MG 2.2 2.1  --   --  2.2  --  2.2  --  2.2   < > = values in this interval not displayed.   Time spent: 25 minutes.  Tyrone Nineyan B Trygg Mantz, MD [facility] [email or web address] Password [email or web address] 01/10/2018, 12:47 PM

## 2018-01-10 NOTE — Progress Notes (Signed)
Blood pressure "soft". Patient asymptomatic.   Will continue to monitor.  Jacqualynn Parco, RN

## 2018-01-10 NOTE — Progress Notes (Signed)
Advanced Heart Failure Rounding Note  PCP-Cardiologist: Lesleigh NoeHenry W Smith III, MD   Subjective:    Continues with brisk UOP on 80 mg IV lasix TID + metolazone 2.5 bid  Weight down another 6 pounds. 24 pounds total (now 258)  Denies CP, SOB, PND or orthopnea. Appetite ok. SBPs in 90s but denies dizziness. Complaining about left ankle pain  Objective:   Weight Range: 117.1 kg (258 lb 3.2 oz) Body mass index is 42.97 kg/m.   Vital Signs:   Temp:  [98.5 F (36.9 C)-99.3 F (37.4 C)] 99.3 F (37.4 C) (06/16 0533) Pulse Rate:  [66-94] 66 (06/16 0859) Resp:  [14-20] 14 (06/16 0533) BP: (83-98)/(48-68) 98/62 (06/16 0859) SpO2:  [93 %-95 %] 93 % (06/16 0533) Weight:  [117.1 kg (258 lb 3.2 oz)] 117.1 kg (258 lb 3.2 oz) (06/16 0533) Last BM Date: 01/08/18  Weight change: Filed Weights   01/08/18 0533 01/09/18 0526 01/10/18 0533  Weight: 119.9 kg (264 lb 6.4 oz) 119.7 kg (264 lb) 117.1 kg (258 lb 3.2 oz)    Intake/Output:   Intake/Output Summary (Last 24 hours) at 01/10/2018 0922 Last data filed at 01/10/2018 0921 Gross per 24 hour  Intake 1490 ml  Output 4800 ml  Net -3310 ml      Physical Exam    General: Sitting in chair. No resp difficulty HEENT: normal Neck: supple. JVP to jaw with prominent CV waves Carotids 2+ bilat; no bruits. No lymphadenopathy or thryomegaly appreciated. Cor: PMI nondisplaced. Regular rate & rhythm. No rubs, gallops or murmurs. Lungs: clear Abdomen: obese soft, nontender, nondistended. No hepatosplenomegaly. No bruits or masses. Good bowel sounds. Extremities: no cyanosis, clubbing, rash, 1+ edema +UNNA boots  Left ankle tender to touch and with dorsiflexion. Neuro: alert & orientedx3, cranial nerves grossly intact. moves all 4 extremities w/o difficulty. Affect pleasant   Telemetry   NSR 60-70s. Personally reviewed.   Labs    CBC Recent Labs    01/09/18 0416 01/10/18 0629  WBC 6.3 5.7  HGB 10.7* 11.1*  HCT 34.8* 36.4  MCV 105.1*  104.6*  PLT 264 254   Basic Metabolic Panel Recent Labs    16/04/9605/15/19 0416 01/09/18 1455 01/10/18 0629  NA 135 135 135  K 4.9 3.4* 3.3*  CL 94* 94* 95*  CO2 31 28 30   GLUCOSE 116* 194* 111*  BUN 33* 31* 31*  CREATININE 1.26* 1.56* 1.36*  CALCIUM 9.0 8.7* 9.0  MG 2.2  --  2.2   Liver Function Tests No results for input(s): AST, ALT, ALKPHOS, BILITOT, PROT, ALBUMIN in the last 72 hours. No results for input(s): LIPASE, AMYLASE in the last 72 hours. Cardiac Enzymes No results for input(s): CKTOTAL, CKMB, CKMBINDEX, TROPONINI in the last 72 hours.  BNP: BNP (last 3 results) Recent Labs    11/09/17 1213 11/18/17 0454 12/31/17 1524  BNP 316.0* 191.3* 408.6*    ProBNP (last 3 results) No results for input(s): PROBNP in the last 8760 hours.   D-Dimer No results for input(s): DDIMER in the last 72 hours. Hemoglobin A1C No results for input(s): HGBA1C in the last 72 hours. Fasting Lipid Panel No results for input(s): CHOL, HDL, LDLCALC, TRIG, CHOLHDL, LDLDIRECT in the last 72 hours. Thyroid Function Tests No results for input(s): TSH, T4TOTAL, T3FREE, THYROIDAB in the last 72 hours.  Invalid input(s): FREET3  Other results:   Imaging    No results found.   Medications:     Scheduled Medications: . apixaban  5  mg Oral BID  . aspirin EC  81 mg Oral Daily  . [START ON 01/15/2018] cyanocobalamin  1,000 mcg Intramuscular Weekly  . diclofenac sodium  2 g Topical QID  . ferrous sulfate  325 mg Oral BID WC  . furosemide  80 mg Intravenous TID  . levothyroxine  175 mcg Oral QAC breakfast  . magnesium oxide  400 mg Oral Daily  . metolazone  2.5 mg Oral BID  . metoprolol tartrate  12.5 mg Oral BID  . pantoprazole  40 mg Oral Daily  . potassium chloride  40 mEq Oral TID  . sodium chloride flush  3 mL Intravenous Q12H  . sodium chloride flush  3 mL Intravenous Q12H  . spironolactone  25 mg Oral Daily    Infusions: . sodium chloride    . sodium chloride Stopped  (01/08/18 1900)  . 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  Admitted with marked volume overload.   Assessment/Plan   1. A/C Diastolic Heart Failure. 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 Now at previous discharge weight but JVP still markedly elevated. Renal function stable   - Continue 80 mg IV lasix TID and metolazone 2.5 bid - Add midodrine for BP support for ongoing diuresis in setting of RV failure. Stop metoprolol.  - Continue spiro 25 mg daily. Creatinine 1.29, K 4.9 - Continue UNNA boots  - Plan RHC tomorrow am. Will hold apixaban tonight.    2. Suspected OSA - Sleep study completed 12/29/2017. No change.   3. Morbid Obesity  - Body mass index is 42.97 kg/m. - Needs to lose weight.   4. H/O DVT/PE - On eliquis. No change.   5. H/O of Congenital Bicuspid Valve w/ Severe Stenosis: s/p bioprosthetic aortic valve replacement with aortic root placement, per Dr. Cornelius Moras June 2018. No change.   6. Hypokalemia.  - K 3.3. Hold K supp.   7. Prolonged QT - QTc 526 on EKG yesterday. Avoid QT prolonging drugs.  8. Deconditioning - PT recommending HHPT and 24 hour supervision  9. Left ankle pain. Suspect acute gout in setting of marked diuresis - check uric acid. Prednisone 40 daily x 3 days  Medication concerns reviewed with patient and pharmacy team. Barriers identified: last admit she was noncompliant with medications. May need HF Paramedicine when she is discharged. Christine Hicks has called to  schedule appointment with her.   Length of Stay: 10  Christine Meres, MD  01/10/2018, 9:22 AM  Advanced Heart Failure Team Pager [phone] (M-F; 7a - 4p)  Please contact [facility] for night-coverage after hours (4p -7a ) and weekends on [email or web address]

## 2018-01-10 NOTE — Progress Notes (Signed)
Patient has been using BSC all morning instead of external catheter. She walked in hallway last night and she tolerated well. Blood pressure continues to be "soft" however patient is asymptomatic.  Everrett Lacasse, RN

## 2018-01-11 ENCOUNTER — Inpatient Hospital Stay (HOSPITAL_COMMUNITY)
Admission: EM | Disposition: A | Discharge status: Home Health | Payer: Self-pay | Source: Home / Self Care | Attending: Family Medicine

## 2018-01-11 ENCOUNTER — Encounter (HOSPITAL_COMMUNITY): Payer: Self-pay | Admitting: Internal Medicine

## 2018-01-11 ENCOUNTER — Telehealth: Payer: Self-pay | Admitting: *Deleted

## 2018-01-11 HISTORY — PX: RIGHT HEART CATH: CATH118263

## 2018-01-11 LAB — POCT I-STAT 3, VENOUS BLOOD GAS (G3P V)
ACID-BASE EXCESS: 7 mmol/L — AB (ref 0.0–2.0)
ACID-BASE EXCESS: 8 mmol/L — AB (ref 0.0–2.0)
Acid-Base Excess: 9 mmol/L — ABNORMAL HIGH (ref 0.0–2.0)
BICARBONATE: 30 mmol/L — AB (ref 20.0–28.0)
BICARBONATE: 32.2 mmol/L — AB (ref 20.0–28.0)
BICARBONATE: 32.6 mmol/L — AB (ref 20.0–28.0)
O2 SAT: 75 %
O2 Saturation: 67 %
O2 Saturation: 71 %
PCO2 VEN: 38.1 mmHg — AB (ref 44.0–60.0)
PCO2 VEN: 41.1 mmHg — AB (ref 44.0–60.0)
PO2 VEN: 32 mmHg (ref 32.0–45.0)
PO2 VEN: 34 mmHg (ref 32.0–45.0)
PO2 VEN: 36 mmHg (ref 32.0–45.0)
TCO2: 33 mmol/L — AB (ref 22–32)
TCO2: 34 mmol/L — AB (ref 22–32)
TCO2: 31 mmol/L (ref 22–32)
pCO2, Ven: 42.9 mmHg — ABNORMAL LOW (ref 44.0–60.0)
pH, Ven: 7.483 — ABNORMAL HIGH (ref 7.250–7.430)
pH, Ven: 7.505 — ABNORMAL HIGH (ref 7.250–7.430)
pH, Ven: 7.508 — ABNORMAL HIGH (ref 7.250–7.430)

## 2018-01-11 LAB — BASIC METABOLIC PANEL
ANION GAP: 11 (ref 5–15)
Anion gap: 13 (ref 5–15)
BUN: 33 mg/dL — AB (ref 6–20)
BUN: 34 mg/dL — ABNORMAL HIGH (ref 6–20)
CHLORIDE: 95 mmol/L — AB (ref 101–111)
CHLORIDE: 96 mmol/L — AB (ref 101–111)
CO2: 29 mmol/L (ref 22–32)
CO2: 30 mmol/L (ref 22–32)
Calcium: 9 mg/dL (ref 8.9–10.3)
Calcium: 9.3 mg/dL (ref 8.9–10.3)
Creatinine, Ser: 1.28 mg/dL — ABNORMAL HIGH (ref 0.44–1.00)
Creatinine, Ser: 1.4 mg/dL — ABNORMAL HIGH (ref 0.44–1.00)
GFR calc Af Amer: 45 mL/min — ABNORMAL LOW (ref >60)
GFR calc non Af Amer: 44 mL/min — ABNORMAL LOW (ref >60)
GFR, EST AFRICAN AMERICAN: 50 mL/min — AB (ref >60)
GFR, EST NON AFRICAN AMERICAN: 39 mL/min — AB (ref >60)
GLUCOSE: 109 mg/dL — AB (ref 65–99)
Glucose, Bld: 98 mg/dL (ref 65–99)
POTASSIUM: 2.9 mmol/L — AB (ref 3.5–5.1)
Potassium: 3.6 mmol/L (ref 3.5–5.1)
Sodium: 137 mmol/L (ref 135–145)
Sodium: 137 mmol/L (ref 135–145)

## 2018-01-11 LAB — CBC
HCT: 35.9 % — ABNORMAL LOW (ref 36.0–46.0)
HEMOGLOBIN: 10.9 g/dL — AB (ref 12.0–15.0)
MCH: 32.2 pg (ref 26.0–34.0)
MCHC: 30.4 g/dL (ref 30.0–36.0)
MCV: 105.9 fL — ABNORMAL HIGH (ref 78.0–100.0)
PLATELETS: 247 10*3/uL (ref 150–400)
RBC: 3.39 MIL/uL — AB (ref 3.87–5.11)
RDW: 16.9 % — ABNORMAL HIGH (ref 11.5–15.5)
WBC: 5.2 10*3/uL (ref 4.0–10.5)

## 2018-01-11 LAB — MAGNESIUM: Magnesium: 2.2 mg/dL (ref 1.7–2.4)

## 2018-01-11 LAB — URIC ACID: Uric Acid, Serum: 18.7 mg/dL — ABNORMAL HIGH (ref 2.3–6.6)

## 2018-01-11 SURGERY — RIGHT HEART CATH
Anesthesia: LOCAL

## 2018-01-11 MED ORDER — HEPARIN (PORCINE) IN NACL 1000-0.9 UT/500ML-% IV SOLN
INTRAVENOUS | Status: AC
  Filled 2018-01-11: qty 500

## 2018-01-11 MED ORDER — LIDOCAINE HCL (PF) 1 % IJ SOLN
INTRAMUSCULAR | Status: AC
  Filled 2018-01-11: qty 30

## 2018-01-11 MED ORDER — SODIUM CHLORIDE 0.9 % IV SOLN: 250.0000 mL | INTRAVENOUS | Status: DC | PRN

## 2018-01-11 MED ORDER — SODIUM CHLORIDE 0.9% FLUSH
3.0000 mL | INTRAVENOUS | Status: DC | PRN
  Administered 2018-01-11 – 2018-01-12 (×2): 3 mL via INTRAVENOUS
  Filled 2018-01-11 (×2): qty 3

## 2018-01-11 MED ORDER — ONDANSETRON HCL 4 MG/2ML IJ SOLN: 4.0000 mg | Freq: Four times a day (QID) | INTRAMUSCULAR | Status: DC | PRN

## 2018-01-11 MED ORDER — POTASSIUM CHLORIDE CRYS ER 20 MEQ PO TBCR
40.0000 meq | EXTENDED_RELEASE_TABLET | Freq: Four times a day (QID) | ORAL | Status: DC
  Administered 2018-01-11: 40 meq via ORAL
  Filled 2018-01-11: qty 2

## 2018-01-11 MED ORDER — SODIUM CHLORIDE 0.9 % IV SOLN
INTRAVENOUS | Status: DC
  Administered 2018-01-11: 08:00:00 via INTRAVENOUS

## 2018-01-11 MED ORDER — HEPARIN (PORCINE) IN NACL 2-0.9 UNITS/ML
INTRAMUSCULAR | Status: AC | PRN
  Administered 2018-01-11: 1000 mL

## 2018-01-11 MED ORDER — APIXABAN 5 MG PO TABS
5.0000 mg | ORAL_TABLET | Freq: Two times a day (BID) | ORAL | Status: DC
  Administered 2018-01-11 (×2): 5 mg via ORAL
  Filled 2018-01-11: qty 1

## 2018-01-11 MED ORDER — ACETAMINOPHEN 325 MG PO TABS: 650.0000 mg | ORAL_TABLET | ORAL | Status: DC | PRN

## 2018-01-11 MED ORDER — POTASSIUM CHLORIDE CRYS ER 20 MEQ PO TBCR
40.0000 meq | EXTENDED_RELEASE_TABLET | Freq: Once | ORAL | Status: AC
  Administered 2018-01-11: 40 meq via ORAL
  Filled 2018-01-11: qty 2

## 2018-01-11 MED ORDER — LIDOCAINE HCL (PF) 1 % IJ SOLN
INTRAMUSCULAR | Status: DC | PRN
  Administered 2018-01-11: 2 mL

## 2018-01-11 MED ORDER — POTASSIUM CHLORIDE 20 MEQ/15ML (10%) PO SOLN
40.0000 meq | Freq: Three times a day (TID) | ORAL | Status: DC
  Administered 2018-01-11 – 2018-01-12 (×4): 40 meq via ORAL
  Filled 2018-01-11 (×4): qty 30

## 2018-01-11 MED ORDER — SODIUM CHLORIDE 0.9% FLUSH
3.0000 mL | Freq: Two times a day (BID) | INTRAVENOUS | Status: DC
  Administered 2018-01-11 – 2018-01-13 (×5): 3 mL via INTRAVENOUS

## 2018-01-11 SURGICAL SUPPLY — 10 items
CATH BALLN WEDGE 5F 110CM (CATHETERS) ×1 IMPLANT
GUIDEWIRE .025 260CM (WIRE) ×1 IMPLANT
HOVERMATT SINGLE USE (MISCELLANEOUS) ×1 IMPLANT
PACK CARDIAC CATHETERIZATION (CUSTOM PROCEDURE TRAY) ×2 IMPLANT
PROTECTION STATION PRESSURIZED (MISCELLANEOUS) ×2
SHEATH RAIN 4/5FR (SHEATH) ×2 IMPLANT
STATION PROTECTION PRESSURIZED (MISCELLANEOUS) IMPLANT
TRANSDUCER W/STOPCOCK (MISCELLANEOUS) ×2 IMPLANT
TUBING ART PRESS 72  MALE/FEM (TUBING) ×1
TUBING ART PRESS 72 MALE/FEM (TUBING) IMPLANT

## 2018-01-11 NOTE — Progress Notes (Signed)
PROGRESS NOTE  Christine Hicks  ZOX:[id number] DOB: April 04, 1956 DOA: 12/31/2017 PCP: Annita Brod, MD   Brief Narrative: Christine Hicks a 62 y.o.femalewith medical history significant ofmorbid obesity, hypertension,dCHF, DVT on Eliquis, GERD, CKD-2, aortic stenosis,s/p ofaortic root replacement and valve replacement, iron deficiency anemia, who presents with shortness breath and leg edema.  Patient states that she has shortness of breath in the past several days, which has been progressively getting worse. She does not have chest pain. She has dry cough. She also noted worsening bilateral leg edema. Her primary care doctor increased her torsemide dose to 60 mg twice a day without significant improvement. Patient states that she has gained weight by approximately 20 pounds since April. She has generalized weakness.She states that she fell twice accidentally without significant injury. She has bruise on the right arm.No unilateral weakness, numbness or tingling his extremities. No facial droop or slurred speech. Denies nausea, vomiting, diarrhea, abdominal pain, symptoms of UTI.  Assessment & Plan: Principal Problem:   Acute on chronic diastolic heart failure (HCC) Active Problems:   Essential hypertension   DVT, recurrent, lower extremity, chronic, bilateral (HCC)   Iron deficiency anemia   GERD (gastroesophageal reflux disease)   Acute renal failure superimposed on stage 2 chronic kidney disease (HCC)  Acute on chronic HFpEF, RV failure, PAH: EF 65-70%, severe TV regurgitation, increased PASP. - Continuing to have significant diuresis, down another 3lbs, 4.372L yesterday on lasix + BID metolazone. Continuing this today.  - RHC 6/17 showed elevated right-sided pressures and normal left sided pressures.  - BP improved with midodrine per HF team.  - Continue daily weights, I/O. Cr stable.  Hypokalemia:  - Continue replacement, increased frequency. Mg ok.  Frequent  falls: No focal deficits on exam. Negative head CT.  - Continue PT/OT efforts to address deconditioning. Encouraged to get OOB with assistance. Plan DC w/HH.   Left foot/ankle acute gout: With tissue swelling on exam/XR without fracture/dislocation. Possibly related to previous fall. Extending to entire leg consistent with swelling as etiology. LE U/S showed no evidence of DVT, though some portions of femoral veins not visualized. Continue on anticoagulation regardless.  - Tx w/steroids 6/16-6/18, uric acid grossly elevated at 18.7. Will need ULT started as outpatient.   Venous insufficiency with stasis dermatitis: Tender erythema worse R > L. Without fever, warmth or leukocytosis, empiric abx were stopped.  - Elevate legs, diurese as above.  - Unna boots 6/14.   AKI on stage II CKD: Cr returned to baseline with diuresis. No proteinuria noted. - Monitor closely w/aggressive diuresis.   HTN:  - Metoprolol dose reduced while aggressively diuresing and with bradycardia. 1st deg AVB on ECG 6/13. Pressures soft. Defer to cardiology. - Continue to monitor   Intention tremor: x2 weeks prior to admission, waxes/wanes. Not present at rest and no ataxia. TSH wnl.  - Recommend neuro follow up as outpatient.  - Optimize metabolic derangements - Avoid caffeine, treat anxiety.  Recurrent chronic bilateral LE DVT: U/S was limited but showed no acute DVTs. - Continue eliquis  Hypothyroidism: TSH 0.487 - Continue synthroid  Vitamin B12 deficiency:  - Given daily injections x1 week, continuing weekly x1 month (ordered) then plan monthly.  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 Cornelius Moras.   Morbid obesity, OHS: BMI >40.  - Nutrition counseling provided  QT prolongation:  - Stable telemetry - Keep K and Mg replaced as above  DVT prophylaxis: Eliquis  Code Status: Full  Family Communication: Family, Son at bedside Disposition Plan: Home once  euvolemic, possible in next 24 hours. Will get HH-PT   Consultants:   HF team  Procedures:   Echocardiogram 01/01/2018: - Left ventricle: The cavity size was normal. Systolic function was   vigorous. The estimated ejection fraction was in the range of 65%   to 70%. Wall motion was normal; there were no regional wall   motion abnormalities. There was no evidence of elevated   ventricular filling pressure by Doppler parameters. - Right atrium: The atrium was severely dilated. - Tricuspid valve: There was severe regurgitation. - Pulmonary arteries: Systolic pressure was mildly increased. PA   peak pressure: 39 mm Hg (S).  Antimicrobials:  Keflex 6/9 - 6/11  Subjective: Continues to diurese with stable kidney function, significant UOP, now using BSC. Working with PT today. Reports tremor is stable if not a bit worsening, most troublesome when holding her phone in her left hand. She denies weakness, numbness. Also less significant tremor in right hand which started abruptly about 2 weeks prior to hospitalization with no past history of this. Intermittent and variable severity, tremor can improve with concentration. Doesn't drink alcohol.   Objective: Vitals:   01/11/18 0926 01/11/18 0931 01/11/18 1053 01/11/18 1159  BP: 125/83 119/76 101/68 96/67  Pulse: 93 92  65  Resp: (!) 118 (!) 21  20  Temp:    98.3 F (36.8 C)  TempSrc:      SpO2: 100% 100% 99% 98%  Weight:      Height:        Intake/Output Summary (Last 24 hours) at 01/11/2018 1408 Last data filed at 01/11/2018 1208 Gross per 24 hour  Intake 126 ml  Output 3575 ml  Net -3449 ml   Filed Weights   01/09/18 0526 01/10/18 0533 01/11/18 0451  Weight: 119.7 kg (264 lb) 117.1 kg (258 lb 3.2 oz) 115.8 kg (255 lb 4.7 oz)   Gen: Pleasant, obese female in no distress Pulm: Nonlabored breathing room air. Clear. CV: Regular rate and rhythm. Prominent S2. No murmur, rub, or gallop. Improved JVD, 1+ dependent edema. GI: Abdomen  soft, non-tender, non-distended, with normoactive bowel sounds.  Ext: Warm, no deformities. TTP left ankle.  Skin: No rashes, lesions or ulcers on visualized skin.  Neuro: Alert and oriented. No focal neurological deficits. Speech normal, visual fields full. Has very mild intention tremor in left hand and less pronounced in right hand provoked by holding objects with weight. No lower extremity tremor or clonus. Movements are fluid with no ataxia.  Psych: Judgement and insight appear fair. Mood euthymic & affect congruent. Behavior is appropriate.    Data Reviewed: I have personally reviewed following labs and imaging studies  CBC: Recent Labs  Lab 01/07/18 0611 01/08/18 0508 01/09/18 0416 01/10/18 0629 01/11/18 0444  WBC 5.5 5.8 6.3 5.7 5.2  HGB 9.9* 10.7* 10.7* 11.1* 10.9*  HCT 32.3* 34.8* 34.8* 36.4 35.9*  MCV 105.2* 103.9* 105.1* 104.6* 105.9*  PLT 224 253 264 254 247   Basic Metabolic Panel: Recent Labs  Lab 01/06/18 0454  01/08/18 1414 01/08/18 1417 01/09/18 0416 01/09/18 1455 01/10/18 0629 01/11/18 0444  NA  --    < >  --  139 135 135 135 137  K  --    < >  --  3.0* 4.9 3.4* 3.3* 2.9*  CL  --    < >  --  94* 94* 94* 95* 95*  CO2  --    < >  --  34* 31 28 30 29   GLUCOSE  --    < >  --  148* 116* 194* 111* 98  BUN  --    < >  --  27* 33* 31* 31* 33*  CREATININE  --    < >  --  1.16* 1.26* 1.56* 1.36* 1.28*  CALCIUM  --    < >  --  9.1 9.0 8.7* 9.0 9.0  MG 2.1  --  2.2  --  2.2  --  2.2 2.2   < > = values in this interval not displayed.   Time spent: 25 minutes.  Tyrone Nineyan B Trinadee Verhagen, MD [facility] [email or web address] Password [email or web address] 01/11/2018, 2:08 PM

## 2018-01-11 NOTE — Progress Notes (Signed)
Advanced Heart Failure Rounding Note  PCP-Cardiologist: Lesleigh NoeHenry W Smith III, MD   Subjective:    Continues with brisk UOP on 80 mg IV lasix TID + metolazone 2.5 bid. Weight down another 3 pounds.   Denies SOB, orthopnea or PND. Still with left leg pain but improving with steroids. Uric acid 18.7   Objective:   Weight Range: 115.8 kg (255 lb 4.7 oz) Body mass index is 42.48 kg/m.   Vital Signs:   Temp:  [97.6 F (36.4 C)-98.2 F (36.8 C)] 98.2 F (36.8 C) (06/17 0451) Pulse Rate:  [58-96] 91 (06/17 0451) Resp:  [17-20] 20 (06/16 2104) BP: (93-118)/(57-83) 102/67 (06/17 0451) SpO2:  [95 %-100 %] 100 % (06/17 0902) Weight:  [115.8 kg (255 lb 4.7 oz)] 115.8 kg (255 lb 4.7 oz) (06/17 0451) Last BM Date: 01/10/18  Weight change: Filed Weights   01/09/18 0526 01/10/18 0533 01/11/18 0451  Weight: 119.7 kg (264 lb) 117.1 kg (258 lb 3.2 oz) 115.8 kg (255 lb 4.7 oz)    Intake/Output:   Intake/Output Summary (Last 24 hours) at 01/11/2018 0906 Last data filed at 01/11/2018 0832 Gross per 24 hour  Intake 123 ml  Output 4225 ml  Net -4102 ml      Physical Exam    General:  Lying in bed  No resp difficulty HEENT: normal Neck: supple. JVP 9-10. Carotids 2+ bilat; no bruits. No lymphadenopathy or thryomegaly appreciated. Cor: PMI nondisplaced. Regular rate & rhythm. 2/6 TR Lungs: clear Abdomen: obese soft, nontender, nondistended. No hepatosplenomegaly. No bruits or masses. Good bowel sounds. Extremities: no cyanosis, clubbing, rash,1+ edema +unna boots Neuro: alert & orientedx3, cranial nerves grossly intact. moves all 4 extremities w/o difficulty. Affect pleasant    Telemetry   NSR 80-90s. Personally reviewed.   Labs    CBC Recent Labs    01/10/18 0629 01/11/18 0444  WBC 5.7 5.2  HGB 11.1* 10.9*  HCT 36.4 35.9*  MCV 104.6* 105.9*  PLT 254 247   Basic Metabolic Panel Recent Labs    40/98/1106/16/19 0629 01/11/18 0444  NA 135 137  K 3.3* 2.9*  CL 95* 95*    CO2 30 29  GLUCOSE 111* 98  BUN 31* 33*  CREATININE 1.36* 1.28*  CALCIUM 9.0 9.0  MG 2.2 2.2   Liver Function Tests No results for input(s): AST, ALT, ALKPHOS, BILITOT, PROT, ALBUMIN in the last 72 hours. No results for input(s): LIPASE, AMYLASE in the last 72 hours. Cardiac Enzymes No results for input(s): CKTOTAL, CKMB, CKMBINDEX, TROPONINI in the last 72 hours.  BNP: BNP (last 3 results) Recent Labs    11/09/17 1213 11/18/17 0454 12/31/17 1524  BNP 316.0* 191.3* 408.6*    ProBNP (last 3 results) No results for input(s): PROBNP in the last 8760 hours.   D-Dimer No results for input(s): DDIMER in the last 72 hours. Hemoglobin A1C No results for input(s): HGBA1C in the last 72 hours. Fasting Lipid Panel No results for input(s): CHOL, HDL, LDLCALC, TRIG, CHOLHDL, LDLDIRECT in the last 72 hours. Thyroid Function Tests No results for input(s): TSH, T4TOTAL, T3FREE, THYROIDAB in the last 72 hours.  Invalid input(s): FREET3  Other results:   Imaging    No results found.   Medications:     Scheduled Medications: . [MAR Hold] apixaban  5 mg Oral BID  . [MAR Hold] aspirin EC  81 mg Oral Daily  . [MAR Hold] cyanocobalamin  1,000 mcg Intramuscular Weekly  . [MAR Hold] diclofenac sodium  2 g Topical QID  . [MAR Hold] ferrous sulfate  325 mg Oral BID WC  . [MAR Hold] furosemide  80 mg Intravenous TID  . [MAR Hold] levothyroxine  175 mcg Oral QAC breakfast  . [MAR Hold] magnesium oxide  400 mg Oral Daily  . [MAR Hold] metolazone  2.5 mg Oral BID  . [MAR Hold] midodrine  2.5 mg Oral TID WC  . [MAR Hold] pantoprazole  40 mg Oral Daily  . [MAR Hold] potassium chloride  40 mEq Oral QID  . [MAR Hold] sodium chloride flush  3 mL Intravenous Q12H  . sodium chloride flush  3 mL Intravenous Q12H  . [MAR Hold] spironolactone  25 mg Oral Daily    Infusions: . [MAR Hold] sodium chloride    . sodium chloride    . sodium chloride Stopped (01/11/18 0849)  . 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  Admitted with marked volume overload.   Assessment/Plan   1. A/C Diastolic Heart Failure. 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 Now at previous discharge weight.  - Will plan RHC today to assess volume status and output - BP improved on midodrine - Continue spiro 25 mg daily. Creatinine 1.28, K 2.9 - Continue UNNA boots   2. Suspected OSA - Sleep study completed 12/29/2017. No change.   3. Morbid Obesity  - Body mass index is 42.48 kg/m. - Needs to lose weight.   4. H/O DVT/PE - On eliquis. No change.   5. H/O of Congenital Bicuspid Valve w/ Severe Stenosis: s/p bioprosthetic aortic valve replacement with aortic root placement, per Dr. Cornelius Moras June 2018. No change.   6. Hypokalemia.  - K 2.9. Supp K.  7. Prolonged QT -  Avoid QT prolonging drugs.  8. Deconditioning - PT recommending HHPT and 24 hour supervision  9. Left ankle pain. Suspect acute gout in setting of marked diuresis - Uric acid markedly elevated. Prednisone 40 daily x 3 days. Today is day 2/3  Medication concerns reviewed with patient and pharmacy team. Barriers identified: last admit she was noncompliant with medications. May need HF Paramedicine when she is discharged. Christine Hicks has called to schedule appointment with her.   Length of Stay: 70  Christine Meres, MD  01/11/2018, 9:06 AM  Advanced Heart Failure  Team Pager [phone] (M-F; 7a - 4p)  Please contact [facility] for night-coverage after hours (4p -7a ) and weekends on [email or web address]

## 2018-01-11 NOTE — Telephone Encounter (Signed)
-----   Message from Quintella Reichertraci R Turner, MD sent at 01/07/2018  6:43 PM EDT ----- Please let patient know that sleep study showed no significant sleep apnea.

## 2018-01-11 NOTE — Progress Notes (Signed)
Lab BMP sample was insufficient, BMP reordered.

## 2018-01-11 NOTE — Progress Notes (Signed)
Physical Therapy Treatment Patient Details Name: Christine Hicks MRN: [id number] DOB: Apr 12, 1956 Today's Date: 01/11/2018    History of Present Illness Pt is a 62 y.o. female admitted 12/31/17 with c/o SOB and LE edema; worked up for CHF. PMHx: multiple falls, morbid obesity, HTN, dCHF, DVT on Eliquis, GERD, CKD-2, aortic stenosis, previous aortic root replacement and valve replacement (2018), iron deficiency anemia.    PT Comments    Pt tolerated increased ambulation distance with RW today. Supervision for safety, pt is overall steady with RW, however demonstrated significantly decreased gait speed indicative of increased fall risk. Pt would benefit form continued skilled PT to maximize pt's functional independence and safety with mobility.     Follow Up Recommendations  Home health PT;Supervision/Assistance - 24 hour     Equipment Recommendations  None recommended by PT    Recommendations for Other Services       Precautions / Restrictions Precautions Precautions: Fall Restrictions Weight Bearing Restrictions: No    Mobility  Bed Mobility               General bed mobility comments: Pt sitting up in chair   Transfers Overall transfer level: Needs assistance Equipment used: Rolling walker (2 wheeled) Transfers: Sit to/from Stand Sit to Stand: Supervision         General transfer comment: No physical assist. Supervision for safety. Use of arm rests.  Ambulation/Gait Ambulation/Gait assistance: Supervision Gait Distance (Feet): 350 Feet Assistive device: Rolling walker (2 wheeled) Gait Pattern/deviations: Decreased stride length;Step-through pattern;Wide base of support Gait velocity: Decreased   General Gait Details: pt very slow, steady with RW. Good technique with RW as pt ambulated with walker as baseline.   Stairs             Wheelchair Mobility    Modified Rankin (Stroke Patients Only)       Balance Overall balance assessment: Mild  deficits observed, not formally tested                                          Cognition Arousal/Alertness: Awake/alert Behavior During Therapy: WFL for tasks assessed/performed Overall Cognitive Status: Within Functional Limits for tasks assessed                                        Exercises      General Comments General comments (skin integrity, edema, etc.): Multiple family members present during session. Family very supportive      Pertinent Vitals/Pain Pain Assessment: Faces Faces Pain Scale: Hurts little more Pain Location: L LE and back Pain Descriptors / Indicators: Burning Pain Intervention(s): Monitored during session;Limited activity within patient's tolerance    Home Living                      Prior Function            PT Goals (current goals can now be found in the care plan section) Acute Rehab PT Goals Patient Stated Goal: to get strength back and get back to normal  PT Goal Formulation: With patient Time For Goal Achievement: 01/15/18 Potential to Achieve Goals: Good Progress towards PT goals: Progressing toward goals    Frequency    Min 3X/week      PT Plan Current plan remains  appropriate    Co-evaluation              AM-PAC PT "6 Clicks" Daily Activity  Outcome Measure  Difficulty turning over in bed (including adjusting bedclothes, sheets and blankets)?: A Little Difficulty moving from lying on back to sitting on the side of the bed? : A Little Difficulty sitting down on and standing up from a chair with arms (e.g., wheelchair, bedside commode, etc,.)?: A Little Help needed moving to and from a bed to chair (including a wheelchair)?: A Little Help needed walking in hospital room?: A Little Help needed climbing 3-5 steps with a railing? : A Lot 6 Click Score: 17    End of Session Equipment Utilized During Treatment: Gait belt Activity Tolerance: Patient tolerated treatment  well Patient left: in chair;with call bell/phone within reach;with family/visitor present Nurse Communication: Mobility status PT Visit Diagnosis: Unsteadiness on feet (R26.81);Other abnormalities of gait and mobility (R26.89);History of falling (Z91.81)     Time: [id number] PT Time Calculation (min) (ACUTE ONLY): 22 min  Charges:  $Gait Training: 8-22 mins                    G Codes:       Kallie LocksHannah Thaer Miyoshi, [place]PTA Pager [phone] Acute Rehab  Sheral ApleyHannah E Taniyah Ballow 01/11/2018, 2:03 PM

## 2018-01-11 NOTE — Telephone Encounter (Signed)
Called results LMTCB 

## 2018-01-11 NOTE — Progress Notes (Signed)
PT Cancellation Note  Patient Details Name: Archie PattenJudith M Iyengar MRN: [id number] DOB: 10/29/1955   Cancelled Treatment:    Reason Eval/Treat Not Completed: Patient at procedure or test/unavailable. Pt currently in cath lab for R heart cath. Will check back as time allows.  Kallie LocksHannah Ngoc Detjen, PTA Pager [phone] Acute Rehab   Sheral ApleyHannah E Agamjot Kilgallon 01/11/2018, 9:16 AM

## 2018-01-11 NOTE — H&P (View-Only) (Signed)
Advanced Heart Failure Rounding Note  PCP-Cardiologist: Lesleigh NoeHenry W Smith III, MD   Subjective:    Continues with brisk UOP on 80 mg IV lasix TID + metolazone 2.5 bid. Weight down another 3 pounds.   Denies SOB, orthopnea or PND. Still with left leg pain but improving with steroids. Uric acid 18.7   Objective:   Weight Range: 115.8 kg (255 lb 4.7 oz) Body mass index is 42.48 kg/m.   Vital Signs:   Temp:  [97.6 F (36.4 C)-98.2 F (36.8 C)] 98.2 F (36.8 C) (06/17 0451) Pulse Rate:  [58-96] 91 (06/17 0451) Resp:  [17-20] 20 (06/16 2104) BP: (93-118)/(57-83) 102/67 (06/17 0451) SpO2:  [95 %-100 %] 100 % (06/17 0902) Weight:  [115.8 kg (255 lb 4.7 oz)] 115.8 kg (255 lb 4.7 oz) (06/17 0451) Last BM Date: 01/10/18  Weight change: Filed Weights   01/09/18 0526 01/10/18 0533 01/11/18 0451  Weight: 119.7 kg (264 lb) 117.1 kg (258 lb 3.2 oz) 115.8 kg (255 lb 4.7 oz)    Intake/Output:   Intake/Output Summary (Last 24 hours) at 01/11/2018 0906 Last data filed at 01/11/2018 0832 Gross per 24 hour  Intake 123 ml  Output 4225 ml  Net -4102 ml      Physical Exam    General:  Lying in bed  No resp difficulty HEENT: normal Neck: supple. JVP 9-10. Carotids 2+ bilat; no bruits. No lymphadenopathy or thryomegaly appreciated. Cor: PMI nondisplaced. Regular rate & rhythm. 2/6 TR Lungs: clear Abdomen: obese soft, nontender, nondistended. No hepatosplenomegaly. No bruits or masses. Good bowel sounds. Extremities: no cyanosis, clubbing, rash,1+ edema +unna boots Neuro: alert & orientedx3, cranial nerves grossly intact. moves all 4 extremities w/o difficulty. Affect pleasant    Telemetry   NSR 80-90s. Personally reviewed.   Labs    CBC Recent Labs    01/10/18 0629 01/11/18 0444  WBC 5.7 5.2  HGB 11.1* 10.9*  HCT 36.4 35.9*  MCV 104.6* 105.9*  PLT 254 247   Basic Metabolic Panel Recent Labs    40/98/1106/16/19 0629 01/11/18 0444  NA 135 137  K 3.3* 2.9*  CL 95* 95*    CO2 30 29  GLUCOSE 111* 98  BUN 31* 33*  CREATININE 1.36* 1.28*  CALCIUM 9.0 9.0  MG 2.2 2.2   Liver Function Tests No results for input(s): AST, ALT, ALKPHOS, BILITOT, PROT, ALBUMIN in the last 72 hours. No results for input(s): LIPASE, AMYLASE in the last 72 hours. Cardiac Enzymes No results for input(s): CKTOTAL, CKMB, CKMBINDEX, TROPONINI in the last 72 hours.  BNP: BNP (last 3 results) Recent Labs    11/09/17 1213 11/18/17 0454 12/31/17 1524  BNP 316.0* 191.3* 408.6*    ProBNP (last 3 results) No results for input(s): PROBNP in the last 8760 hours.   D-Dimer No results for input(s): DDIMER in the last 72 hours. Hemoglobin A1C No results for input(s): HGBA1C in the last 72 hours. Fasting Lipid Panel No results for input(s): CHOL, HDL, LDLCALC, TRIG, CHOLHDL, LDLDIRECT in the last 72 hours. Thyroid Function Tests No results for input(s): TSH, T4TOTAL, T3FREE, THYROIDAB in the last 72 hours.  Invalid input(s): FREET3  Other results:   Imaging    No results found.   Medications:     Scheduled Medications: . [MAR Hold] apixaban  5 mg Oral BID  . [MAR Hold] aspirin EC  81 mg Oral Daily  . [MAR Hold] cyanocobalamin  1,000 mcg Intramuscular Weekly  . [MAR Hold] diclofenac sodium  2 g Topical QID  . [MAR Hold] ferrous sulfate  325 mg Oral BID WC  . [MAR Hold] furosemide  80 mg Intravenous TID  . [MAR Hold] levothyroxine  175 mcg Oral QAC breakfast  . [MAR Hold] magnesium oxide  400 mg Oral Daily  . [MAR Hold] metolazone  2.5 mg Oral BID  . [MAR Hold] midodrine  2.5 mg Oral TID WC  . [MAR Hold] pantoprazole  40 mg Oral Daily  . [MAR Hold] potassium chloride  40 mEq Oral QID  . [MAR Hold] sodium chloride flush  3 mL Intravenous Q12H  . sodium chloride flush  3 mL Intravenous Q12H  . [MAR Hold] spironolactone  25 mg Oral Daily    Infusions: . [MAR Hold] sodium chloride    . sodium chloride    . sodium chloride Stopped (01/11/18 0849)  . 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  Admitted with marked volume overload.   Assessment/Plan   1. A/C Diastolic Heart Failure. 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 Now at previous discharge weight.  - Will plan RHC today to assess volume status and output - BP improved on midodrine - Continue spiro 25 mg daily. Creatinine 1.28, K 2.9 - Continue UNNA boots   2. Suspected OSA - Sleep study completed 12/29/2017. No change.   3. Morbid Obesity  - Body mass index is 42.48 kg/m. - Needs to lose weight.   4. H/O DVT/PE - On eliquis. No change.   5. H/O of Congenital Bicuspid Valve w/ Severe Stenosis: s/p bioprosthetic aortic valve replacement with aortic root placement, per Dr. Cornelius Moras June 2018. No change.   6. Hypokalemia.  - K 2.9. Supp K.  7. Prolonged QT -  Avoid QT prolonging drugs.  8. Deconditioning - PT recommending HHPT and 24 hour supervision  9. Left ankle pain. Suspect acute gout in setting of marked diuresis - Uric acid markedly elevated. Prednisone 40 daily x 3 days. Today is day 2/3  Medication concerns reviewed with patient and pharmacy team. Barriers identified: last admit she was noncompliant with medications. May need HF Paramedicine when she is discharged. Geraldine Contras has called to schedule appointment with her.   Length of Stay: 70  Arvilla Meres, MD  01/11/2018, 9:06 AM  Advanced Heart Failure  Team Pager [phone] (M-F; 7a - 4p)  Please contact [facility] for night-coverage after hours (4p -7a ) and weekends on [email or web address]

## 2018-01-11 NOTE — Progress Notes (Signed)
Pt to cath lab in stable condition, pt was given aM lasix by previous shift,pt said she asked not to get it pt had purewick in place and removed for cath,

## 2018-01-11 NOTE — Progress Notes (Signed)
Pt received from cath lab, right ac access with coban dressing clean dry and intact, no orders and called cath lab and spoke with Clydie BraunKaren who will ask Dr Clarise Cruzbenshimon to do post cath orders.

## 2018-01-11 NOTE — Interval H&P Note (Signed)
History and Physical Interval Note:  01/11/2018 9:11 AM  Christine PattenJudith M Harrison  has presented today for surgery, with the diagnosis of chf  The various methods of treatment have been discussed with the patient and family. After consideration of risks, benefits and other options for treatment, the patient has consented to  Procedure(s): RIGHT HEART CATH (N/A) as a surgical intervention .  The patient's history has been reviewed, patient examined, no change in status, stable for surgery.  I have reviewed the patient's chart and labs.  Questions were answered to the patient's satisfaction.     [name]

## 2018-01-12 LAB — BASIC METABOLIC PANEL
ANION GAP: 13 (ref 5–15)
BUN: 33 mg/dL — ABNORMAL HIGH (ref 6–20)
CO2: 28 mmol/L (ref 22–32)
Calcium: 9.3 mg/dL (ref 8.9–10.3)
Chloride: 96 mmol/L — ABNORMAL LOW (ref 101–111)
Creatinine, Ser: 1.29 mg/dL — ABNORMAL HIGH (ref 0.44–1.00)
GFR calc Af Amer: 50 mL/min — ABNORMAL LOW (ref >60)
GFR calc non Af Amer: 43 mL/min — ABNORMAL LOW (ref >60)
GLUCOSE: 95 mg/dL (ref 65–99)
POTASSIUM: 3.6 mmol/L (ref 3.5–5.1)
Sodium: 137 mmol/L (ref 135–145)

## 2018-01-12 LAB — CBC
HEMATOCRIT: 37.2 % (ref 36.0–46.0)
Hemoglobin: 11.2 g/dL — ABNORMAL LOW (ref 12.0–15.0)
MCH: 31.7 pg (ref 26.0–34.0)
MCHC: 30.1 g/dL (ref 30.0–36.0)
MCV: 105.4 fL — ABNORMAL HIGH (ref 78.0–100.0)
Platelets: 246 10*3/uL (ref 150–400)
RBC: 3.53 MIL/uL — AB (ref 3.87–5.11)
RDW: 16.9 % — ABNORMAL HIGH (ref 11.5–15.5)
WBC: 5.9 10*3/uL (ref 4.0–10.5)

## 2018-01-12 LAB — MAGNESIUM: Magnesium: 2.1 mg/dL (ref 1.7–2.4)

## 2018-01-12 MED ORDER — APIXABAN 2.5 MG PO TABS
2.5000 mg | ORAL_TABLET | Freq: Two times a day (BID) | ORAL | Status: DC
  Administered 2018-01-12 – 2018-01-14 (×5): 2.5 mg via ORAL
  Filled 2018-01-12 (×5): qty 1

## 2018-01-12 MED ORDER — FUROSEMIDE 10 MG/ML IJ SOLN
80.0000 mg | Freq: Two times a day (BID) | INTRAMUSCULAR | Status: AC
  Administered 2018-01-12 – 2018-01-13 (×3): 80 mg via INTRAVENOUS
  Filled 2018-01-12 (×3): qty 8

## 2018-01-12 MED ORDER — PREDNISONE 20 MG PO TABS
20.0000 mg | ORAL_TABLET | Freq: Every day | ORAL | Status: DC
  Administered 2018-01-13 – 2018-01-14 (×2): 20 mg via ORAL
  Filled 2018-01-12 (×2): qty 1

## 2018-01-12 MED ORDER — POTASSIUM CHLORIDE 20 MEQ/15ML (10%) PO SOLN
40.0000 meq | Freq: Two times a day (BID) | ORAL | Status: AC
  Administered 2018-01-12 – 2018-01-13 (×3): 40 meq via ORAL
  Filled 2018-01-12 (×3): qty 30

## 2018-01-12 MED ORDER — PREDNISONE 10 MG PO TABS: 10.0000 mg | ORAL_TABLET | Freq: Every day | ORAL | Status: DC

## 2018-01-12 MED ORDER — ALLOPURINOL 100 MG PO TABS
100.0000 mg | ORAL_TABLET | Freq: Every day | ORAL | Status: DC
  Administered 2018-01-12 – 2018-01-14 (×3): 100 mg via ORAL
  Filled 2018-01-12 (×3): qty 1

## 2018-01-12 MED ORDER — POTASSIUM CHLORIDE 20 MEQ/15ML (10%) PO SOLN: 20.0000 meq | Freq: Three times a day (TID) | ORAL | Status: DC

## 2018-01-12 MED FILL — Heparin Sod (Porcine)-NaCl IV Soln 1000 Unit/500ML-0.9%: INTRAVENOUS | Qty: 1000 | Status: AC

## 2018-01-12 NOTE — Progress Notes (Signed)
Occupational Therapy Treatment Patient Details Name: Christine Hicks MRN: [id number] DOB: 12-08-1955 Today's Date: 01/12/2018    History of present illness Pt is a 62 y.o. female admitted 12/31/17 with c/o SOB and LE edema; worked up for CHF. PMHx: multiple falls, morbid obesity, HTN, dCHF, DVT on Eliquis, GERD, CKD-2, aortic stenosis, previous aortic root replacement and valve replacement (2018), iron deficiency anemia.   OT comments  Pt progressing towards established OT goals. Pt performing toileting with Mod A. Requiring Mod A to power up into standing from lower surface and Mod A for cleaning after BM. Providing education on use of BSC over toilet at home to increase independence and safety during toilet transfers; pt agrees this would be beneficial. Providing education on AE (toilet aide) for toilet hygiene to increase independence; Pt verbalized understanding. Pt reporting recent falls during tub transfers. Educating pt on use of tub bench (which her husband owns) to prevent falls and increase energy conservation and safety. Continue to recommend dc home once medically stable and will continue to follow acutely as admitted.    Follow Up Recommendations  No OT follow up;Supervision - Intermittent    Equipment Recommendations  3 in 1 bedside commode(Bariatric)    Recommendations for Other Services      Precautions / Restrictions Precautions Precautions: Fall Restrictions Weight Bearing Restrictions: No       Mobility Bed Mobility               General bed mobility comments: In bathroom upon arrival  Transfers Overall transfer level: Needs assistance Equipment used: Rolling walker (2 wheeled) Transfers: Sit to/from Stand Sit to Stand: Supervision;Mod assist         General transfer comment: Pt performing transfers with supervision. Requiring increased assistance from toilet and needing Mod A to power up into standing from low surface.    Balance Overall balance  assessment: Mild deficits observed, not formally tested                                         ADL either performed or assessed with clinical judgement   ADL Overall ADL's : Needs assistance/impaired     Grooming: Oral care;Wash/dry face;Supervision/safety;Set up;Standing Grooming Details (indicate cue type and reason): Pt performing grooming at sink with supervision for safety                 Toilet Transfer: Moderate assistance;Ambulation;Regular Toilet;Grab bars;RW Statistician Details (indicate cue type and reason): Pt requiring Mod A to power up from lower surface. Discussed use of BSC over toilet to increase safety and independence.  Toileting- Clothing Manipulation and Hygiene: Moderate assistance;Sit to/from stand Toileting - Clothing Manipulation Details (indicate cue type and reason): Pt able to perform toilet hygiene after urnation and requiring Mod A for cleaning after BM. Educating pt on AE (toilet aide) for increasing independence with toilet hygiene at home. Pt thanksful for information.    Tub/Shower Transfer Details (indicate cue type and reason): Pt reporting she has falling in her tub during transfers. Educating pt on use of tub trnasfer bench to increase safety anddecrease fall risk. Pt reporting that her husband has a tub bench in storage and agrees to bring it out for use at home.  Functional mobility during ADLs: Supervision/safety;Rolling walker General ADL Comments: Pt demonstrating increased functional performance. Pt continues to have diffiuclty with functional transfers and toileting. Providing education on  DME and AE to increase safety and idnependence.      Vision       Perception     Praxis      Cognition Arousal/Alertness: Awake/alert Behavior During Therapy: WFL for tasks assessed/performed Overall Cognitive Status: Within Functional Limits for tasks assessed                                           Exercises     Shoulder Instructions       General Comments Pt with multiple questions. Pt reporting that upon last admission pt recieved RW (regular) and pt unable to use RW due to width. Pt reporting she knows she needs a bariatric RW. Case manager notified. Pt also with question about diet change for lunch order. RN notified.     Pertinent Vitals/ Pain       Pain Assessment: Faces Faces Pain Scale: Hurts a little bit Pain Location: L LE and back Pain Descriptors / Indicators: Discomfort Pain Intervention(s): Monitored during session;Repositioned  Home Living                                          Prior Functioning/Environment              Frequency  Min 2X/week        Progress Toward Goals  OT Goals(current goals can now be found in the care plan section)  Progress towards OT goals: Progressing toward goals  Acute Rehab OT Goals Patient Stated Goal: to get strength back and get back to normal  OT Goal Formulation: With patient Time For Goal Achievement: 01/15/18 Potential to Achieve Goals: Good ADL Goals Pt Will Perform Grooming: with modified independence;standing Pt Will Perform Upper Body Bathing: with modified independence;standing Pt Will Perform Lower Body Bathing: with modified independence;sit to/from stand Pt Will Perform Upper Body Dressing: with modified independence;standing Pt Will Perform Lower Body Dressing: with modified independence;sit to/from stand Pt Will Transfer to Toilet: with modified independence;ambulating;bedside commode;grab bars Pt Will Perform Toileting - Clothing Manipulation and hygiene: with modified independence;sit to/from stand Pt Will Perform Tub/Shower Transfer: Tub transfer;with supervision;ambulating;tub bench;rolling walker  Plan Discharge plan remains appropriate    Co-evaluation                 AM-PAC PT "6 Clicks" Daily Activity     Outcome Measure   Help from another person eating  meals?: None Help from another person taking care of personal grooming?: A Little Help from another person toileting, which includes using toliet, bedpan, or urinal?: A Little Help from another person bathing (including washing, rinsing, drying)?: A Little Help from another person to put on and taking off regular upper body clothing?: A Little Help from another person to put on and taking off regular lower body clothing?: A Little 6 Click Score: 19    End of Session Equipment Utilized During Treatment: Rolling walker  OT Visit Diagnosis: Unsteadiness on feet (R26.81)   Activity Tolerance Patient tolerated treatment well   Patient Left in chair;with call bell/phone within reach   Nurse Communication Mobility status        Time: 9147-8295 OT Time Calculation (min): 31 min  Charges: OT General Charges $OT Visit: 1 Visit OT Treatments $Self Care/Home Management : 23-37 mins  Christine Hicks, Christine Hicks Acute Rehab Pager: [phone] Office: [phone]   Christine Hicks 01/12/2018, 10:04 AM

## 2018-01-12 NOTE — Progress Notes (Signed)
PROGRESS NOTE  Christine Hicks  LKG:[id number] DOB: 1955/09/05 DOA: 12/31/2017 PCP: Annita Brod, MD   Brief Narrative: Christine Hicks a 62 y.o.femalewith medical history significant ofmorbid obesity, hypertension,dCHF, DVT on Eliquis, GERD, CKD-2, aortic stenosis,s/p ofaortic root replacement and valve replacement, iron deficiency anemia, who presents with shortness breath and leg edema.  Patient states that she has shortness of breath in the past several days, which has been progressively getting worse. She does not have chest pain. She has dry cough. She also noted worsening bilateral leg edema. Her primary care doctor increased her torsemide dose to 60 mg twice a day without significant improvement. Patient states that she has gained weight by approximately 20 pounds since April. She has generalized weakness.She states that she fell twice accidentally without significant injury. She has bruise on the right arm.No unilateral weakness, numbness or tingling his extremities. No facial droop or slurred speech. Denies nausea, vomiting, diarrhea, abdominal pain, symptoms of UTI.  Assessment & Plan: Principal Problem:   Acute on chronic diastolic heart failure (HCC) Active Problems:   Essential hypertension   DVT, recurrent, lower extremity, chronic, bilateral (HCC)   Iron deficiency anemia   GERD (gastroesophageal reflux disease)   Acute renal failure superimposed on stage 2 chronic kidney disease (HCC)  Acute on chronic HFpEF, RV failure, PAH: EF 65-70%, severe TV regurgitation, increased PASP. - Continuing to have significant diuresis, down another 3.7L (nearly 25L net out) to 246lbs (well below previously thought EDW) HF team recommends continue lasix IV + metolazone. Possible DC on oral regimen (including new spiro and midodrine) per cards. D/w CM who will follow up these medications and check for affordability, discuss with patient.  - RHC 6/17 showed elevated  right-sided pressures and normal left sided pressures.  - BP improved with midodrine per HF team.  - Continue daily weights, I/O. Cr remains stable.  Hypokalemia: Improved.  - Continue replacement, recheck in AM.  Frequent falls: No focal deficits on exam. Negative head CT.  - Continue PT/OT efforts to address deconditioning. Encouraged to get OOB with assistance. Plan DC w/HH.   Left ankle acute gout: With tissue swelling on exam/XR without fracture/dislocation. LE U/S showed no evidence of DVT, though some portions of femoral veins not visualized. Continue on anticoagulation regardless.  - Tx w/steroids started 6/16. Will continue with taper to 20mg  tomorrow to off over next few days.  - Uric acid grossly elevated at 18.7, so will start ULT now, recommended start dose is 100mg , can titrate in next 1-2 weeks and monitor for rash.   Venous insufficiency with stasis dermatitis: Tender erythema worse R > L. Without fever, warmth or leukocytosis, empiric abx were stopped.  - Elevate legs, diurese as above.  - Unna boots 6/14. Can either attempt TED hose prior to DC or have HH-RN follow for continued unna boots.   AKI on stage II CKD: Cr returned to baseline with diuresis. No proteinuria noted. - Monitor closely w/aggressive diuresis.   HTN:  - Metoprolol held while aggressively diuresing and with bradycardia. 1st deg AVB on ECG 6/13. Pressures soft. Defer to cardiology. - Continue to monitor   Intention tremor: x2 weeks prior to admission, waxes/wanes. Not present at rest and no ataxia. TSH wnl.  - Recommend neuro follow up as outpatient.  - Worsened during admission, wonder if metoprolol was treating essential tremor.  - Optimize metabolic derangements - Avoid caffeine, treat anxiety.  Recurrent chronic bilateral LE DVT: U/S was limited but showed no acute DVTs. -  Continue eliquis at lower dose  Hypothyroidism: TSH 0.487 - Continue synthroid  Vitamin B12 deficiency:  - Given daily  injections x1 week, continuing weekly x1 month (ordered) then plan monthly.  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 Cornelius Moras.   Morbid obesity, OHS: BMI >40.  - Nutrition counseling provided  QT prolongation:  - Stable telemetry - Keep K and Mg replaced as above  DVT prophylaxis: Eliquis  Code Status: Full Family Communication: None at bedside this AM Disposition Plan: Home once euvolemic, possible in next 24 hours. Will get HH-PT.  Consultants:   HF team  Procedures:   Echocardiogram 01/01/2018: - Left ventricle: The cavity size was normal. Systolic function was   vigorous. The estimated ejection fraction was in the range of 65%   to 70%. Wall motion was normal; there were no regional wall   motion abnormalities. There was no evidence of elevated   ventricular filling pressure by Doppler parameters. - Right atrium: The atrium was severely dilated. - Tricuspid valve: There was severe regurgitation. - Pulmonary arteries: Systolic pressure was mildly increased. PA   peak pressure: 39 mm Hg (S).  Antimicrobials:  Keflex 6/9 - 6/11  Subjective: Left ankle pain continues, improved but severe. Feels more energy, no dyspnea. Swelling improved. Tremor not present this AM.  Objective: Vitals:   01/11/18 2215 01/12/18 0027 01/12/18 0523 01/12/18 0544  BP: (!) 90/55 (!) 97/49  101/61  Pulse:    70  Resp:    18  Temp:    98.4 F (36.9 C)  TempSrc:    Oral  SpO2:    97%  Weight:   111.8 kg (246 lb 6.4 oz)   Height:        Intake/Output Summary (Last 24 hours) at 01/12/2018 1135 Last data filed at 01/12/2018 0844 Gross per 24 hour  Intake 1766 ml  Output 3050 ml  Net -1284 ml   Filed Weights   01/10/18 0533 01/11/18 0451 01/12/18 0523  Weight: 117.1 kg (258 lb 3.2 oz) 115.8 kg (255 lb 4.7 oz) 111.8 kg (246 lb 6.4 oz)   Gen: Pleasant, obese 62 y.o. female in no distress Pulm: Nonlabored breathing room air. Clear without  crackles. CV: Regular rate and rhythm. No murmur, prominent S2. No rub or gallop. Still +JVD and bipedal dependent edema. GI: Abdomen soft, non-tender, non-distended, with normoactive bowel sounds.  Ext: Warm, no deformities. Tenderness to palpation on left ankle and bilateral LEs. Skin: No rashes, lesions or ulcers on visualized skin.  Neuro: Alert and oriented. No focal neurological deficits. No tremor noted while holding bowl in hand, bringing grits to mouth in spoon. Psych: Judgement and insight appear fair. Mood euthymic & affect congruent. Behavior is appropriate.    Data Reviewed: I have personally reviewed following labs and imaging studies  CBC: Recent Labs  Lab 01/08/18 0508 01/09/18 0416 01/10/18 0629 01/11/18 0444 01/12/18 0411  WBC 5.8 6.3 5.7 5.2 5.9  HGB 10.7* 10.7* 11.1* 10.9* 11.2*  HCT 34.8* 34.8* 36.4 35.9* 37.2  MCV 103.9* 105.1* 104.6* 105.9* 105.4*  PLT 253 264 254 247 246   Basic Metabolic Panel: Recent Labs  Lab 01/08/18 1414  01/09/18 0416 01/09/18 1455 01/10/18 0629 01/11/18 0444 01/11/18 2142 01/12/18 0411  NA  --    < > 135 135 135 137 137 137  K  --    < > 4.9 3.4* 3.3* 2.9* 3.6 3.6  CL  --    < >  94* 94* 95* 95* 96* 96*  CO2  --    < > 31 28 30 29 30 28   GLUCOSE  --    < > 116* 194* 111* 98 109* 95  BUN  --    < > 33* 31* 31* 33* 34* 33*  CREATININE  --    < > 1.26* 1.56* 1.36* 1.28* 1.40* 1.29*  CALCIUM  --    < > 9.0 8.7* 9.0 9.0 9.3 9.3  MG 2.2  --  2.2  --  2.2 2.2  --  2.1   < > = values in this interval not displayed.   Time spent: 25 minutes.  Tyrone Nineyan B Ketsia Linebaugh, MD [facility] [email or web address] Password [email or web address] 01/12/2018, 11:35 AM

## 2018-01-12 NOTE — Care Management Important Message (Signed)
Important Message  Patient Details  Name: Christine Hicks MRN: [id number] Date of Birth: 08/15/1955   Medicare Important Message Given:  Yes    Andersen Mckiver P Terrie Grajales 01/12/2018, 3:22 PM

## 2018-01-12 NOTE — Plan of Care (Signed)
  Problem: Health Behavior/Discharge Planning: Goal: Ability to manage health-related needs will improve Outcome: Progressing   Problem: Clinical Measurements: Goal: Ability to maintain clinical measurements within normal limits will improve Outcome: Progressing Goal: Will remain free from infection Outcome: Progressing Goal: Diagnostic test results will improve Outcome: Progressing Goal: Respiratory complications will improve Outcome: Progressing Goal: Cardiovascular complication will be avoided Outcome: Progressing   Problem: Activity: Goal: Risk for activity intolerance will decrease Outcome: Progressing   Problem: Nutrition: Goal: Adequate nutrition will be maintained Outcome: Progressing   Problem: Elimination: Goal: Will not experience complications related to urinary retention Outcome: Progressing   Problem: Pain Managment: Goal: General experience of comfort will improve Outcome: Progressing   Problem: Skin Integrity: Goal: Risk for impaired skin integrity will decrease Outcome: Progressing   Problem: Education: Goal: Ability to demonstrate management of disease process will improve Outcome: Progressing Goal: Ability to verbalize understanding of medication therapies will improve Outcome: Progressing   Problem: Activity: Goal: Capacity to carry out activities will improve Outcome: Progressing   Problem: Cardiac: Goal: Ability to achieve and maintain adequate cardiopulmonary perfusion will improve Outcome: Progressing   Problem: Education: Goal: Understanding of CV disease, CV risk reduction, and recovery process will improve Outcome: Progressing   Problem: Activity: Goal: Ability to return to baseline activity level will improve Outcome: Progressing   Problem: Cardiovascular: Goal: Ability to achieve and maintain adequate cardiovascular perfusion will improve Outcome: Progressing Goal: Vascular access site(s) Level 0-1 will be maintained Outcome:  Progressing   Problem: Health Behavior/Discharge Planning: Goal: Ability to safely manage health-related needs after discharge will improve Outcome: Progressing

## 2018-01-12 NOTE — Progress Notes (Signed)
Spoke w Driscilla Moatsaphne Wood, Nurse Navigator [facility]. Verified that patient is linked into para medicine program and will be followed for medication compliance.

## 2018-01-12 NOTE — Plan of Care (Signed)
  Problem: Health Behavior/Discharge Planning: Goal: Ability to manage health-related needs will improve Outcome: Progressing   Problem: Clinical Measurements: Goal: Ability to maintain clinical measurements within normal limits will improve Outcome: Progressing   Problem: Activity: Goal: Risk for activity intolerance will decrease Outcome: Progressing   Problem: Pain Managment: Goal: General experience of comfort will improve Outcome: Progressing   

## 2018-01-12 NOTE — Progress Notes (Addendum)
Advanced Heart Failure Rounding Note  PCP-Cardiologist: Lyn Records III, MD   Subjective:    RHC with mildly elevated filling pressures yesterday R>L. No significant PAH. Normal CO.   Remains on IV lasix. Weight down another 9 pounds to 246. (36 pounds total)   Feels good. No CP or SOB. No orthopnea or PND. No dizziness. Creatinine stable.   Leg pain/gout improving with steroids but still sore  Objective:   Weight Range: 246 lb 6.4 oz (111.8 kg) Body mass index is 41 kg/m.   Vital Signs:   Temp:  [97.5 F (36.4 C)-98.4 F (36.9 C)] 98.4 F (36.9 C) (06/18 0544) Pulse Rate:  [65-94] 70 (06/18 0544) Resp:  [12-118] 18 (06/18 0544) BP: (90-125)/(49-90) 101/61 (06/18 0544) SpO2:  [97 %-100 %] 97 % (06/18 0544) Weight:  [246 lb 6.4 oz (111.8 kg)] 246 lb 6.4 oz (111.8 kg) (06/18 0523) Last BM Date: 01/11/18  Weight change: Filed Weights   01/10/18 0533 01/11/18 0451 01/12/18 0523  Weight: 258 lb 3.2 oz (117.1 kg) 255 lb 4.7 oz (115.8 kg) 246 lb 6.4 oz (111.8 kg)    Intake/Output:   Intake/Output Summary (Last 24 hours) at 01/12/2018 0640 Last data filed at 01/12/2018 0556 Gross per 24 hour  Intake 1526 ml  Output 3700 ml  Net -2174 ml      Physical Exam    General:  Well appearing. No resp difficulty HEENT: normal Neck: supple. JVP to jaw Carotids 2+ bilat; no bruits. No lymphadenopathy or thryomegaly appreciated. Cor: PMI nondisplaced. Regular rate & rhythm. No 2/6 TR Lungs: clear Abdomen: soft, nontender, nondistended. No hepatosplenomegaly. No bruits or masses. Good bowel sounds. Extremities: no cyanosis, clubbing, rash, 1+ edema + UNNA boots Neuro: alert & orientedx3, cranial nerves grossly intact. moves all 4 extremities w/o difficulty. Affect pleasant  Telemetry   NSR 70-80s. Personally reviewed.   Labs    CBC Recent Labs    01/11/18 0444 01/12/18 0411  WBC 5.2 5.9  HGB 10.9* 11.2*  HCT 35.9* 37.2  MCV 105.9* 105.4*  PLT 247 246    Basic Metabolic Panel Recent Labs    45/40/98 0444 01/11/18 2142 01/12/18 0411  NA 137 137 137  K 2.9* 3.6 3.6  CL 95* 96* 96*  CO2 29 30 28   GLUCOSE 98 109* 95  BUN 33* 34* 33*  CREATININE 1.28* 1.40* 1.29*  CALCIUM 9.0 9.3 9.3  MG 2.2  --  2.1   Liver Function Tests No results for input(s): AST, ALT, ALKPHOS, BILITOT, PROT, ALBUMIN in the last 72 hours. No results for input(s): LIPASE, AMYLASE in the last 72 hours. Cardiac Enzymes No results for input(s): CKTOTAL, CKMB, CKMBINDEX, TROPONINI in the last 72 hours.  BNP: BNP (last 3 results) Recent Labs    11/09/17 1213 11/18/17 0454 12/31/17 1524  BNP 316.0* 191.3* 408.6*    ProBNP (last 3 results) No results for input(s): PROBNP in the last 8760 hours.   D-Dimer No results for input(s): DDIMER in the last 72 hours. Hemoglobin A1C No results for input(s): HGBA1C in the last 72 hours. Fasting Lipid Panel No results for input(s): CHOL, HDL, LDLCALC, TRIG, CHOLHDL, LDLDIRECT in the last 72 hours. Thyroid Function Tests No results for input(s): TSH, T4TOTAL, T3FREE, THYROIDAB in the last 72 hours.  Invalid input(s): FREET3  Other results:   Imaging    No results found.   Medications:     Scheduled Medications: . apixaban  5 mg Oral BID  .  aspirin EC  81 mg Oral Daily  . [START ON 01/15/2018] cyanocobalamin  1,000 mcg Intramuscular Weekly  . diclofenac sodium  2 g Topical QID  . ferrous sulfate  325 mg Oral BID WC  . furosemide  80 mg Intravenous TID  . levothyroxine  175 mcg Oral QAC breakfast  . magnesium oxide  400 mg Oral Daily  . metolazone  2.5 mg Oral BID  . midodrine  2.5 mg Oral TID WC  . pantoprazole  40 mg Oral Daily  . potassium chloride  40 mEq Oral TID  . sodium chloride flush  3 mL Intravenous Q12H  . sodium chloride flush  3 mL Intravenous Q12H  . spironolactone  25 mg Oral Daily    Infusions: . sodium chloride    . sodium chloride      PRN Medications: sodium chloride,  sodium chloride, acetaminophen, acetaminophen, albuterol, HYDROcodone-acetaminophen, hydrOXYzine, ondansetron (ZOFRAN) IV, sodium chloride flush, sodium chloride flush, zolpidem    Patient Profile   [name] is a 62 year old with a history of congenital bicuspid aortic valve s/p porcine AVR + aortic root replacement 2018, HTN, hypothyroidism, OSA, morbid obesity, DVT/PE  on chronic eliquis, and chronic diastolic heart failure.  Admitted with marked volume overload.   Assessment/Plan   1. A/C Diastolic Heart Failure. Suspect PAH/RV failure due to OHS -ECHO 01/01/2018 EF 65-70%.(discharge weight 257 pounds)  - RHC numbers reviewed with her.  - Weight way down and RHC numbers pretty good but CVP still up on exam.  - Will try to diurese one more day but cut lasix back to 80 IV bid. Stop metoalzone - Will switch back to torsemide 60 bid starting tomorrow.  - Likely should be ready for d/c home tomorrow.  - Counseled on need to cut down fluid intake. - [facility] - Will f/u in HF Clinic - Continue midodrine for BP support   2. Suspected OSA - Sleep study completed 12/29/2017. No change.   3. Morbid Obesity  - Body mass index is 41 kg/m. - Needs to lose weight.   4. H/O DVT/PE - On eliquis. Was > 6 months ago. Can switch to 2.5 bid per Amplify EXT trial  5. H/O of Congenital Bicuspid Valve w/ Severe Stenosis: s/p bioprosthetic aortic valve replacement with aortic root placement, per Dr. Cornelius Moraswen June 2018. No change.   6. Hypokalemia.  - K 3.6. Supp K.  7. Prolonged QT -  Avoid QT prolonging drugs.  8. Deconditioning - PT recommending HHPT and 24 hour supervision  9. Left ankle pain. Suspect acute gout in setting of marked diuresis - Uric acid markedly elevated. Prednisone 40 daily x 3 days. Today is day 3/3. If pain persists may need longer taper. Will defer to primary team. Start allopurinol 300 daily.   Possible meds for d/c home  tomorrow  Torsemide 60 bid KCL 40 Daily  (lower dose) Cleda DaubSpiro 25 daily (new) Apixaban 2.5 bid (lower dose) Midodrine 2.5 tid (new) Metolazone 2.5 mg prn for weight 255 or greater Allopurinol 300 daily + other non-cardiac meds  Stop: ASA Metoprolol   Medication concerns reviewed with patient and pharmacy team. Barriers identified: last admit she was noncompliant with medications. May need HF Paramedicine when she is discharged. Christine Hicks has called to schedule appointment with her.   Length of Stay: 12  Arvilla Meresaniel Kare Dado, MD  01/12/2018, 6:40 AM  Advanced Heart Failure Team Pager [phone] (M-F; 7a - 4p)  Please contact [facility] for night-coverage after hours (4p -7a ) and weekends on [email or web address]

## 2018-01-13 LAB — BASIC METABOLIC PANEL
ANION GAP: 10 (ref 5–15)
BUN: 35 mg/dL — ABNORMAL HIGH (ref 6–20)
CHLORIDE: 96 mmol/L — AB (ref 101–111)
CO2: 29 mmol/L (ref 22–32)
Calcium: 9.4 mg/dL (ref 8.9–10.3)
Creatinine, Ser: 1.45 mg/dL — ABNORMAL HIGH (ref 0.44–1.00)
GFR calc non Af Amer: 37 mL/min — ABNORMAL LOW (ref >60)
GFR, EST AFRICAN AMERICAN: 43 mL/min — AB (ref >60)
Glucose, Bld: 103 mg/dL — ABNORMAL HIGH (ref 65–99)
Potassium: 3.6 mmol/L (ref 3.5–5.1)
SODIUM: 135 mmol/L (ref 135–145)

## 2018-01-13 LAB — CBC
HCT: 37.7 % (ref 36.0–46.0)
HEMOGLOBIN: 11.5 g/dL — AB (ref 12.0–15.0)
MCH: 31.9 pg (ref 26.0–34.0)
MCHC: 30.5 g/dL (ref 30.0–36.0)
MCV: 104.4 fL — ABNORMAL HIGH (ref 78.0–100.0)
Platelets: 254 10*3/uL (ref 150–400)
RBC: 3.61 MIL/uL — ABNORMAL LOW (ref 3.87–5.11)
RDW: 16.4 % — ABNORMAL HIGH (ref 11.5–15.5)
WBC: 5.9 10*3/uL (ref 4.0–10.5)

## 2018-01-13 LAB — MAGNESIUM: Magnesium: 2.2 mg/dL (ref 1.7–2.4)

## 2018-01-13 MED ORDER — PREDNISONE 20 MG PO TABS
20.0000 mg | ORAL_TABLET | Freq: Once | ORAL | Status: AC
  Administered 2018-01-13: 20 mg via ORAL
  Filled 2018-01-13: qty 1

## 2018-01-13 MED ORDER — METOLAZONE 2.5 MG PO TABS: 2.5000 mg | ORAL_TABLET | Freq: Once | ORAL | Status: DC

## 2018-01-13 MED ORDER — POTASSIUM CHLORIDE CRYS ER 20 MEQ PO TBCR
40.0000 meq | EXTENDED_RELEASE_TABLET | Freq: Every day | ORAL | Status: DC
  Administered 2018-01-14: 40 meq via ORAL
  Filled 2018-01-13: qty 2

## 2018-01-13 MED ORDER — TORSEMIDE 20 MG PO TABS
60.0000 mg | ORAL_TABLET | Freq: Two times a day (BID) | ORAL | Status: DC
Start: 2018-01-14 — End: 2018-01-14
  Administered 2018-01-14: 60 mg via ORAL
  Filled 2018-01-13: qty 3

## 2018-01-13 MED ORDER — POTASSIUM CHLORIDE 20 MEQ/15ML (10%) PO SOLN
20.0000 meq | Freq: Once | ORAL | Status: AC
  Administered 2018-01-13: 20 meq via ORAL
  Filled 2018-01-13: qty 15

## 2018-01-13 NOTE — Progress Notes (Signed)
Patient Demographics:    Christine Hicks, is a 62 y.o. female, DOB - [birth year], ZOX:[id number]  Admit date - 12/31/2017   Admitting Physician Lorretta Harp, MD  Outpatient Primary MD for the patient is Annita Brod, MD  LOS - 13   Chief Complaint  Patient presents with  . Leg Swelling        Subjective:    [name] today has no fevers, no emesis,  No chest pain,  C/o bil ankle pain, Lt > rt   Assessment  & Plan :    Principal Problem:   Acute on chronic diastolic heart failure (HCC) Active Problems:   Essential hypertension   DVT, recurrent, lower extremity, chronic, bilateral (HCC)   Iron deficiency anemia   GERD (gastroesophageal reflux disease)   Acute renal failure superimposed on stage 2 chronic kidney disease (HCC)  Brief Narrative: Christine Hicks a 62 y.o.femalewith medical history significant ofmorbid obesity, hypertension,dCHF, DVT on Eliquis, GERD, CKD-2, aortic stenosis,s/p ofaortic root replacement and valve replacement, iron deficiency anemia, who presents with shortness breath and leg edema.  Patient states that she has shortness of breath in the past several days, which has been progressively getting worse. She does not have chest pain. She has dry cough. She also noted worsening bilateral leg edema. Her primary care doctor increased her torsemide dose to 60 mg twice a day without significant improvement. Patient states that she has gained weight by approximately 20 pounds since April. She has generalized weakness.She states that she fell twice accidentally without significant injury. She has bruise on the right arm.No unilateral weakness, numbness or tingling his extremities. No facial droop or slurred speech. Denies nausea, vomiting, diarrhea, abdominal pain, symptoms of UTI. RHC 01/11/18 with mildly elevated pulmonary artery pressures   Plan:- 1)HFpEF/acute on  chronic dCHF exacerbation in the setting of OSA/OHS--- echo with  RV failure, PAH: EF 65-70%, severe TV regurgitation, increased PASP,  clinically improving, however remains volume overloaded,  RHC with mildly elevated pulmonary artery pressures, cardiology recommends Lasix IV 80 mg twice daily, plan to transition to torsemide, continue Aldactone, give metolazone x 1, fluid restriction also advised to drink for pressure support advised, patient with history of prolonged QT, be judicious with QT prolonging drugs, metoprolol on hold due to bradycardia.  Creatinine stable around 1.4 with diuresis  2)H/o Recurrent DVT/PE-continue Eliquis 2.5 mg twice daily  3)H/Oof Congenital Bicuspid Valve w/ Severe Stenosis: - s/p bioprosthetic aortic valve replacement with aortic root placement, per Dr. Cornelius Moras June 2018, stable  4)Bil Ankle Pain--- in the setting of aggressive diuresis, suspect gout, left more than the right, uric acid very elevated, prednisone as ordered, continue allopurinol  5)Generalized weakness/Debility/history of falls--patient prefers discharge home with home health once medically stable, bilateral ankle pain/gout making mobility related activities of daily living more difficult at this time  6)Morbid Obesity/OSA/OHS-patient had sleep study on 12/29/2017, apparently no change in recommendations  7)Hypothyroidism: TSH 0.487 - Continue synthroid  8)Vitamin B12 deficiency:  - Given daily injections x1 week, continuing weekly x1 month (ordered) then plan monthly   Code Status : Full    Disposition Plan  : home on 01/14/18 if bilateral ankle pain or swelling improves and patient can ambulate  Consults  : Cardiology  DVT Prophylaxis  :  Apixaban  Lab Results  Component Value Date   PLT 254 01/13/2018    Inpatient Medications  Scheduled Meds: . allopurinol  100 mg Oral Daily  . apixaban  2.5 mg Oral BID  . aspirin EC  81 mg Oral Daily  . [START ON 01/15/2018] cyanocobalamin   1,000 mcg Intramuscular Weekly  . diclofenac sodium  2 g Topical QID  . ferrous sulfate  325 mg Oral BID WC  . furosemide  80 mg Intravenous BID  . levothyroxine  175 mcg Oral QAC breakfast  . magnesium oxide  400 mg Oral Daily  . midodrine  2.5 mg Oral TID WC  . pantoprazole  40 mg Oral Daily  . potassium chloride  40 mEq Oral BID  . [START ON 01/14/2018] potassium chloride  40 mEq Oral Daily  . predniSONE  20 mg Oral Q breakfast   Followed by  . [START ON 01/16/2018] predniSONE  10 mg Oral Q breakfast  . sodium chloride flush  3 mL Intravenous Q12H  . sodium chloride flush  3 mL Intravenous Q12H  . spironolactone  25 mg Oral Daily  . [START ON 01/14/2018] torsemide  60 mg Oral BID   Continuous Infusions: . sodium chloride    . sodium chloride     PRN Meds:.sodium chloride, sodium chloride, acetaminophen, acetaminophen, albuterol, HYDROcodone-acetaminophen, hydrOXYzine, ondansetron (ZOFRAN) IV, sodium chloride flush, sodium chloride flush, zolpidem    Anti-infectives (From admission, onward)   Start     Dose/Rate Route Frequency Ordered Stop   01/03/18 1500  cephALEXin (KEFLEX) capsule 500 mg  Status:  Discontinued     500 mg Oral Every 6 hours 01/03/18 1315 01/05/18 1233        Objective:   Vitals:   01/12/18 1215 01/12/18 1944 01/13/18 0509 01/13/18 1222  BP: 100/68 113/67 (!) 108/57 110/76  Pulse: 74 73 74 72  Resp:    18  Temp: 97.7 F (36.5 C) 98.7 F (37.1 C) (!) 97.5 F (36.4 C) 98.1 F (36.7 C)  TempSrc: Oral  Oral Oral  SpO2: 98% 100% 97% 98%  Weight:   111.7 kg (246 lb 3.2 oz)   Height:        Wt Readings from Last 3 Encounters:  01/13/18 111.7 kg (246 lb 3.2 oz)  12/29/17 119.3 kg (263 lb)  12/01/17 120.6 kg (265 lb 12.8 oz)     Intake/Output Summary (Last 24 hours) at 01/13/2018 1635 Last data filed at 01/13/2018 1503 Gross per 24 hour  Intake 963 ml  Output 2951 ml  Net -1988 ml     Physical Exam  Gen:- Awake Alert,  In no apparent  distress  HEENT:- [place].AT, No sclera icterus Neck-Supple Neck,No JVD,.  Lungs-  CTAB , good air movement CV- S1, S2 normal Abd-  +ve B.Sounds, Abd Soft, No tenderness,    Extremity/Skin: 1 +  edema,   Unna Booth Psych-affect is appropriate, oriented x3 Neuro-no new focal deficits, no tremors   Data Review:   Micro Results No results found for this or any previous visit (from the past 240 hour(s)).  Radiology Reports Dg Chest 2 View  Result Date: 12/31/2017 CLINICAL DATA:  Cough and shortness of breath for 1 week. EXAM: CHEST - 2 VIEW COMPARISON:  11/09/2017 FINDINGS: Cardiomegaly and pulmonary vascular congestion noted with trace bilateral pleural effusions. Median sternotomy again identified. There is no evidence of focal airspace disease, pulmonary edema, suspicious pulmonary nodule/mass or pneumothorax. No acute bony  abnormalities are identified. IMPRESSION: Cardiomegaly, pulmonary vascular congestion and trace bilateral pleural effusions. Electronically Signed   By: Harmon PierJeffrey  Hu M.D.   On: 12/31/2017 16:06   Ct Head Wo Contrast  Result Date: 12/31/2017 CLINICAL DATA:  62 year old female with fall and head trauma. Patient on anticoagulation. EXAM: CT HEAD WITHOUT CONTRAST TECHNIQUE: Contiguous axial images were obtained from the base of the skull through the vertex without intravenous contrast. COMPARISON:  None. FINDINGS: Brain: No evidence of acute infarction, hemorrhage, hydrocephalus, extra-axial collection or mass lesion/mass effect. Vascular: No hyperdense vessel or unexpected calcification. Skull: Normal. Negative for fracture or focal lesion. Sinuses/Orbits: No acute finding. Other: None. IMPRESSION: Unremarkable noncontrast head CT Electronically Signed   By: Harmon PierJeffrey  Hu M.D.   On: 12/31/2017 17:07   Dg Foot 2 Views Left  Result Date: 01/05/2018 CLINICAL DATA:  Pain following fall EXAM: LEFT FOOT - 2 VIEW COMPARISON:  None. FINDINGS: Frontal and lateral views were obtained. There is  marked soft tissue swelling throughout the dorsum of the foot. Bones are diffusely osteoporotic. There is no evident fracture or dislocation. Joint spaces appear unremarkable. There is spurring in the dorsal midfoot. There is an inferior calcaneal spur. IMPRESSION: Marked soft tissue swelling dorsally. Bones osteoporotic. No fracture or dislocation. Spurring dorsal midfoot. Small inferior calcaneal spur. No appreciable joint space narrowing or erosion. Electronically Signed   By: Bretta BangWilliam  Woodruff III M.D.   On: 01/05/2018 13:29     CBC Recent Labs  Lab 01/09/18 0416 01/10/18 0629 01/11/18 0444 01/12/18 0411 01/13/18 0525  WBC 6.3 5.7 5.2 5.9 5.9  HGB 10.7* 11.1* 10.9* 11.2* 11.5*  HCT 34.8* 36.4 35.9* 37.2 37.7  PLT 264 254 247 246 254  MCV 105.1* 104.6* 105.9* 105.4* 104.4*  MCH 32.3 31.9 32.2 31.7 31.9  MCHC 30.7 30.5 30.4 30.1 30.5  RDW 16.8* 17.0* 16.9* 16.9* 16.4*    Chemistries  Recent Labs  Lab 01/09/18 0416  01/10/18 0629 01/11/18 0444 01/11/18 2142 01/12/18 0411 01/13/18 0525  NA 135   < > 135 137 137 137 135  K 4.9   < > 3.3* 2.9* 3.6 3.6 3.6  CL 94*   < > 95* 95* 96* 96* 96*  CO2 31   < > 30 29 30 28 29   GLUCOSE 116*   < > 111* 98 109* 95 103*  BUN 33*   < > 31* 33* 34* 33* 35*  CREATININE 1.26*   < > 1.36* 1.28* 1.40* 1.29* 1.45*  CALCIUM 9.0   < > 9.0 9.0 9.3 9.3 9.4  MG 2.2  --  2.2 2.2  --  2.1 2.2   < > = values in this interval not displayed.   ------------------------------------------------------------------------------------------------------------------ No results for input(s): CHOL, HDL, LDLCALC, TRIG, CHOLHDL, LDLDIRECT in the last 72 hours.  Lab Results  Component Value Date   HGBA1C 5.5 12/30/2016   ------------------------------------------------------------------------------------------------------------------ No results for input(s): TSH, T4TOTAL, T3FREE, THYROIDAB in the last 72 hours.  Invalid input(s):  FREET3 ------------------------------------------------------------------------------------------------------------------ No results for input(s): VITAMINB12, FOLATE, FERRITIN, TIBC, IRON, RETICCTPCT in the last 72 hours.  Coagulation profile Recent Labs  Lab 01/08/18 0508  INR 2.11    No results for input(s): DDIMER in the last 72 hours.  Cardiac Enzymes No results for input(s): CKMB, TROPONINI, MYOGLOBIN in the last 168 hours.  Invalid input(s): CK ------------------------------------------------------------------------------------------------------------------    Component Value Date/Time   BNP 408.6 (H) 12/31/2017 1524     Shon Haleourage Ronesha Heenan M.D on 01/13/2018 at 4:35 PM  Between 7am to 7pm - Pager - [phone]  After 7pm go to [email or web address] - password [email or web address]  [facility] -  Office  [phone]   Voice Recognition Viviann Spare dictation system was used to create this note, attempts have been made to correct errors. Please contact the author with questions and/or clarifications.

## 2018-01-13 NOTE — Progress Notes (Signed)
Patient resting comfortably during shift report. Denies complaints.  

## 2018-01-13 NOTE — Progress Notes (Signed)
Heart Failure PA on floor, notified of questions regarding UNNA boots and whether or not they need to be replaced or removed today.

## 2018-01-13 NOTE — Progress Notes (Signed)
Physical Therapy Treatment Patient Details Name: Christine Hicks MRN: [id number] DOB: 01/15/1956 Today's Date: 01/13/2018    History of Present Illness Pt is a 62 y.o. female admitted 12/31/17 with c/o SOB and LE edema; worked up for CHF. PMHx: multiple falls, morbid obesity, HTN, dCHF, DVT on Eliquis, GERD, CKD-2, aortic stenosis, previous aortic root replacement and valve replacement (2018), iron deficiency anemia.    PT Comments    Patient progressing well with therapy, ambulating further distances with improved gait speed and fluidity. Patient de sat to 88% after 200' of walking, returned to low 90's quickly with rest break. Patient reports feeling confident to return home tomorrow with families care, no concerns from PT standpoint at this time.    Follow Up Recommendations  Home health PT;Supervision/Assistance - 24 hour     Equipment Recommendations  None recommended by PT    Recommendations for Other Services       Precautions / Restrictions Precautions Precautions: Fall Restrictions Weight Bearing Restrictions: No    Mobility  Bed Mobility               General bed mobility comments: in chair at entry  Transfers Overall transfer level: Needs assistance Equipment used: Rolling walker (2 wheeled) Transfers: Sit to/from Stand Sit to Stand: Supervision Stand pivot transfers: Supervision          Ambulation/Gait Ambulation/Gait assistance: Supervision Gait Distance (Feet): 380 Feet Assistive device: Rolling walker (2 wheeled) Gait Pattern/deviations: Decreased stride length;Step-through pattern;Wide base of support Gait velocity: Decreased   General Gait Details: Patient ambulating with improvement in gait speed, no chair folow, no LOB, desats to 88% on RA after 250' of walking, returns to low 90's with short rest break.   Stairs             Wheelchair Mobility    Modified Rankin (Stroke Patients Only)       Balance Overall balance  assessment: Mild deficits observed, not formally tested                                          Cognition Arousal/Alertness: Awake/alert Behavior During Therapy: WFL for tasks assessed/performed Overall Cognitive Status: Within Functional Limits for tasks assessed                                        Exercises      General Comments        Pertinent Vitals/Pain Pain Assessment: Faces Faces Pain Scale: Hurts a little bit Pain Location: L LE and back Pain Descriptors / Indicators: Discomfort Pain Intervention(s): Limited activity within patient's tolerance;Monitored during session;Premedicated before session    Home Living                      Prior Function            PT Goals (current goals can now be found in the care plan section) Acute Rehab PT Goals Patient Stated Goal: to get strength back and get back to normal  PT Goal Formulation: With patient Time For Goal Achievement: 01/15/18 Potential to Achieve Goals: Good Progress towards PT goals: Progressing toward goals    Frequency    Min 3X/week      PT Plan Current plan remains appropriate  Co-evaluation              AM-PAC PT "6 Clicks" Daily Activity  Outcome Measure  Difficulty turning over in bed (including adjusting bedclothes, sheets and blankets)?: A Little Difficulty moving from lying on back to sitting on the side of the bed? : A Little Difficulty sitting down on and standing up from a chair with arms (e.g., wheelchair, bedside commode, etc,.)?: A Little Help needed moving to and from a bed to chair (including a wheelchair)?: A Little Help needed walking in hospital room?: A Little Help needed climbing 3-5 steps with a railing? : A Lot 6 Click Score: 17    End of Session Equipment Utilized During Treatment: Gait belt Activity Tolerance: Patient tolerated treatment well Patient left: in chair;with call bell/phone within reach;with  family/visitor present Nurse Communication: Mobility status PT Visit Diagnosis: Unsteadiness on feet (R26.81);Other abnormalities of gait and mobility (R26.89);History of falling (Z91.81)     Time: 1610-9604 PT Time Calculation (min) (ACUTE ONLY): 20 min  Charges:  $Gait Training: 8-22 mins                    G Codes:      Etta Grandchild, PT, DPT Acute Rehab Services Pager: [phone]     Etta Grandchild 01/13/2018, 5:30 PM

## 2018-01-13 NOTE — Progress Notes (Signed)
Orthopedic Tech Progress Note Patient Details:  Archie PattenJudith M Eline 03/30/1956 [id number]  Ortho Devices Type of Ortho Device: Ace wrap, Unna boot Ortho Device/Splint Location: (B) LE Ortho Device/Splint Interventions: Ordered, Application   Post Interventions Patient Tolerated: Well Instructions Provided: Care of device   Jennye MoccasinHughes, Daevon Holdren Craig 01/13/2018, 7:27 PM

## 2018-01-13 NOTE — Progress Notes (Signed)
Call back received from ortho tech. Tech states they will come check patient, as today is day 5 and that is the max they leave UNNA boots on.

## 2018-01-13 NOTE — Progress Notes (Addendum)
Advanced Heart Failure Rounding Note  PCP-Cardiologist: Lyn Records III, MD   Subjective:    RHC with mildly elevated filling pressures yesterday R>L. No significant PAH. Normal CO.   Weight stable at 246 lbs despite IV lasix. (Down 36 lbs total)   Feels OK this am. Continues to have leg pain/gout but able to walk halls with PT. Denies orthopnea or PND. BP stable on midodrine.  IM adjusting medications. They are going to keep her at least one more day.    Objective:   Weight Range: 246 lb 3.2 oz (111.7 kg) Body mass index is 40.97 kg/m.   Vital Signs:   Temp:  [97.5 F (36.4 C)-98.7 F (37.1 C)] 97.5 F (36.4 C) (06/19 0509) Pulse Rate:  [73-74] 74 (06/19 0509) BP: (100-113)/(57-68) 108/57 (06/19 0509) SpO2:  [97 %-100 %] 97 % (06/19 0509) Weight:  [246 lb 3.2 oz (111.7 kg)] 246 lb 3.2 oz (111.7 kg) (06/19 0509) Last BM Date: 01/12/18  Weight change: Filed Weights   01/11/18 0451 01/12/18 0523 01/13/18 0509  Weight: 255 lb 4.7 oz (115.8 kg) 246 lb 6.4 oz (111.8 kg) 246 lb 3.2 oz (111.7 kg)    Intake/Output:   Intake/Output Summary (Last 24 hours) at 01/13/2018 1138 Last data filed at 01/12/2018 2000 Gross per 24 hour  Intake 963 ml  Output 2300 ml  Net -1337 ml      Physical Exam    General: NAD  HEENT: Normal anicteric.  Neck: Supple. JVP to jaw. Carotids 2+ bilat; no bruits. No thyromegaly or nodule noted. Cor: PMI nondisplaced. RRR, 2/6 TR.  Lungs: Distant. No wheeze  Abdomen: Morbidly ebese, non-tender, non-distended, no HSM. No bruits or masses. +BS  Extremities: No cyanosis, clubbing, or rash. 1+ edema. + UNNA boots. warm Neuro: alert & oriented x 3, cranial nerves grossly intact. moves all 4 extremities w/o difficulty. Affect pleasant   Telemetry   NSR 70-80s, personally reviewed.   Labs    CBC Recent Labs    01/12/18 0411 01/13/18 0525  WBC 5.9 5.9  HGB 11.2* 11.5*  HCT 37.2 37.7  MCV 105.4* 104.4*  PLT 246 254   Basic  Metabolic Panel Recent Labs    78/29/56 0411 01/13/18 0525  NA 137 135  K 3.6 3.6  CL 96* 96*  CO2 28 29  GLUCOSE 95 103*  BUN 33* 35*  CREATININE 1.29* 1.45*  CALCIUM 9.3 9.4  MG 2.1 2.2   Liver Function Tests No results for input(s): AST, ALT, ALKPHOS, BILITOT, PROT, ALBUMIN in the last 72 hours. No results for input(s): LIPASE, AMYLASE in the last 72 hours. Cardiac Enzymes No results for input(s): CKTOTAL, CKMB, CKMBINDEX, TROPONINI in the last 72 hours.  BNP: BNP (last 3 results) Recent Labs    11/09/17 1213 11/18/17 0454 12/31/17 1524  BNP 316.0* 191.3* 408.6*    ProBNP (last 3 results) No results for input(s): PROBNP in the last 8760 hours.   D-Dimer No results for input(s): DDIMER in the last 72 hours. Hemoglobin A1C No results for input(s): HGBA1C in the last 72 hours. Fasting Lipid Panel No results for input(s): CHOL, HDL, LDLCALC, TRIG, CHOLHDL, LDLDIRECT in the last 72 hours. Thyroid Function Tests No results for input(s): TSH, T4TOTAL, T3FREE, THYROIDAB in the last 72 hours.  Invalid input(s): FREET3  Other results:   Imaging    No results found.   Medications:     Scheduled Medications: . allopurinol  100 mg Oral Daily  .  apixaban  2.5 mg Oral BID  . aspirin EC  81 mg Oral Daily  . [START ON 01/15/2018] cyanocobalamin  1,000 mcg Intramuscular Weekly  . diclofenac sodium  2 g Topical QID  . ferrous sulfate  325 mg Oral BID WC  . furosemide  80 mg Intravenous BID  . levothyroxine  175 mcg Oral QAC breakfast  . magnesium oxide  400 mg Oral Daily  . midodrine  2.5 mg Oral TID WC  . pantoprazole  40 mg Oral Daily  . potassium chloride  40 mEq Oral BID  . predniSONE  20 mg Oral Q breakfast   Followed by  . [START ON 01/16/2018] predniSONE  10 mg Oral Q breakfast  . predniSONE  20 mg Oral Once  . sodium chloride flush  3 mL Intravenous Q12H  . sodium chloride flush  3 mL Intravenous Q12H  . spironolactone  25 mg Oral Daily     Infusions: . sodium chloride    . sodium chloride      PRN Medications: sodium chloride, sodium chloride, acetaminophen, acetaminophen, albuterol, HYDROcodone-acetaminophen, hydrOXYzine, ondansetron (ZOFRAN) IV, sodium chloride flush, sodium chloride flush, zolpidem    Patient Profile   [name] is a 62 year old with a history of congenital bicuspid aortic valve s/p porcine AVR + aortic root replacement 2018, HTN, hypothyroidism, OSA, morbid obesity, DVT/PE  on chronic eliquis, and chronic diastolic heart failure.  Admitted with marked volume overload.   Assessment/Plan   1. A/C Diastolic Heart Failure. Suspect PAH/RV failure due to OHS - ECHO 01/01/2018 EF 65-70%.(discharge weight 257 pounds)  - RHC numbers reviewed with her.  - Remains volume overloaded on exam. Continue IV lasix 80 mg BId.  - Counseled on need to cut down fluid intake. - [facility]. - Will arrange HF Clinic - Continue midodrine for BP support  - Reinforced fluid restriction to < 2 L daily, sodium restriction to less than 2000 mg daily, and the importance of daily weights.    2. Suspected OSA - Sleep study completed 12/29/2017. No change.   3. Morbid Obesity  - Body mass index is 40.97 kg/m. - Needs to lose weight.   4. H/O DVT/PE - Continue eliquis 2.5 mg BID per Amplify EXT trial   5. H/O of Congenital Bicuspid Valve w/ Severe Stenosis:  - s/p bioprosthetic aortic valve replacement with aortic root placement, per Dr. Cornelius Moraswen June 2018.  - No change.   6. Hypokalemia.  - K 3.6. Supp K.   7. Prolonged QT -  Avoid QT prolonging drugs.  8. Deconditioning - PT recommending HHPT and 24 hour supervision  9. Left ankle pain.  - Suspect acute gout in setting of marked diuresis - Uric acid markedly elevated.  - Prednisone taper per primary team.  - Continue allopurinol 300 daily.   Will arrange follow up in HF clinic. Possibly home tomorrow. Remains volume  overloaded and continues to have left ankle pain. IM treating.   Medication concerns reviewed with patient and pharmacy team. Barriers identified: Non-compliance. Likely needs paramedicine.   Length of Stay: [id number]13  Graciella FreerMichael Andrew Tillery, [place]PA-C  01/13/2018, 11:38 AM  Advanced Heart Failure Team Pager [phone] (M-F; 7a - 4p)  Please contact [facility] for night-coverage after hours (4p -7a ) and weekends on [email or web address]  Patient seen and examined with the above-signed Advanced Practice Provider and/or Housestaff. I personally reviewed laboratory data, imaging studies and relevant notes. I independently examined the patient and  formulated the important aspects of the plan. I have edited the note to reflect any of my changes or salient points. I have personally discussed the plan with the patient and/or family.  Much improved. Continues to diurese. Creatinine up slightly but close to baseline. Will transition to po diuretics. Stable for d/c from HF standpoint. Would continue HF meds as above.   Arvilla Meres, MD  7:07 PM

## 2018-01-14 DIAGNOSIS — I2721 Secondary pulmonary arterial hypertension: Secondary | ICD-10-CM

## 2018-01-14 LAB — BASIC METABOLIC PANEL
Anion gap: 12 (ref 5–15)
BUN: 37 mg/dL — AB (ref 6–20)
CALCIUM: 9.4 mg/dL (ref 8.9–10.3)
CO2: 29 mmol/L (ref 22–32)
CREATININE: 1.37 mg/dL — AB (ref 0.44–1.00)
Chloride: 95 mmol/L — ABNORMAL LOW (ref 101–111)
GFR calc Af Amer: 46 mL/min — ABNORMAL LOW (ref >60)
GFR calc non Af Amer: 40 mL/min — ABNORMAL LOW (ref >60)
GLUCOSE: 112 mg/dL — AB (ref 65–99)
Potassium: 3.7 mmol/L (ref 3.5–5.1)
SODIUM: 136 mmol/L (ref 135–145)

## 2018-01-14 LAB — CBC
HCT: 37.8 % (ref 36.0–46.0)
Hemoglobin: 11.4 g/dL — ABNORMAL LOW (ref 12.0–15.0)
MCH: 31.7 pg (ref 26.0–34.0)
MCHC: 30.2 g/dL (ref 30.0–36.0)
MCV: 105 fL — ABNORMAL HIGH (ref 78.0–100.0)
PLATELETS: 279 10*3/uL (ref 150–400)
RBC: 3.6 MIL/uL — ABNORMAL LOW (ref 3.87–5.11)
RDW: 15.8 % — AB (ref 11.5–15.5)
WBC: 7.4 10*3/uL (ref 4.0–10.5)

## 2018-01-14 LAB — MAGNESIUM: Magnesium: 2.3 mg/dL (ref 1.7–2.4)

## 2018-01-14 MED ORDER — TORSEMIDE 20 MG PO TABS: 60.0000 mg | ORAL_TABLET | Freq: Two times a day (BID) | ORAL | 3 refills | Status: DC

## 2018-01-14 MED ORDER — POTASSIUM CHLORIDE CRYS ER 20 MEQ PO TBCR: 40.0000 meq | EXTENDED_RELEASE_TABLET | Freq: Every day | ORAL | 3 refills | Status: DC

## 2018-01-14 MED ORDER — PREDNISONE 20 MG PO TABS: 20.0000 mg | ORAL_TABLET | Freq: Every day | ORAL | 0 refills | Status: AC

## 2018-01-14 MED ORDER — FERROUS SULFATE 325 (65 FE) MG PO TABS: 325.0000 mg | ORAL_TABLET | Freq: Two times a day (BID) | ORAL | 3 refills | Status: AC

## 2018-01-14 MED ORDER — APIXABAN 2.5 MG PO TABS: 2.5000 mg | ORAL_TABLET | Freq: Two times a day (BID) | ORAL | 3 refills | Status: DC

## 2018-01-14 MED ORDER — CYANOCOBALAMIN 1000 MCG/ML IJ SOLN: 1000.0000 ug | INTRAMUSCULAR | 0 refills | Status: DC

## 2018-01-14 MED ORDER — SPIRONOLACTONE 25 MG PO TABS: 25.0000 mg | ORAL_TABLET | Freq: Every day | ORAL | 3 refills | Status: DC

## 2018-01-14 MED ORDER — ALLOPURINOL 100 MG PO TABS: 100.0000 mg | ORAL_TABLET | Freq: Every day | ORAL | 3 refills | Status: DC

## 2018-01-14 MED ORDER — MIDODRINE HCL 2.5 MG PO TABS: 2.5000 mg | ORAL_TABLET | Freq: Three times a day (TID) | ORAL | 3 refills | Status: DC

## 2018-01-14 MED ORDER — MAGNESIUM 400 MG PO CAPS: 400.0000 mg | ORAL_CAPSULE | Freq: Every day | ORAL | 3 refills | Status: AC

## 2018-01-14 MED ORDER — PANTOPRAZOLE SODIUM 40 MG PO TBEC: 40.0000 mg | DELAYED_RELEASE_TABLET | Freq: Every day | ORAL | 3 refills | Status: DC

## 2018-01-14 MED ORDER — LEVOTHYROXINE SODIUM 175 MCG PO TABS: 175.0000 ug | ORAL_TABLET | Freq: Every day | ORAL | 3 refills | Status: DC

## 2018-01-14 MED ORDER — METOLAZONE 2.5 MG PO TABS: ORAL_TABLET | ORAL | 0 refills | Status: DC

## 2018-01-14 NOTE — Care Management Note (Addendum)
Case Management Note Previous Note Created by Jiles CrockerBrenda Chandler  Patient Details  Name: Christine PattenJudith M Hicks MRN: [id number] Date of Birth: 05/05/1956  Subjective/Objective:   CHF               Action/Plan: Admitted x 2 in 6 months; PCP is Dr Crecencio McAsenzo; has private insurance with Medicare; [facility]; rolling walker at home; CM will continue to follow for progression of care.  Expected Discharge Date:  01/14/18               Expected Discharge Plan:  Home w Home Health Services  Discharge planning Services  CM Consult, HF Clinic  Choice offered to:  Patient  DME Arranged; 3:1, Rollator  HH Arranged:  RN, Disease Management, PT [facility]  Status of Service:  In process, will continue to follow  Samuel BoucheClaxton, Ridwan Bondy S, RN,MHA,BSN [phone]  01/14/2018 [facility] - specifics regarding RN care of lower extremities communicated to liaison - referral of HH and HF orders acceptance confirmed.  Pt was on Eliquis prior to admit - no issues with copay.  Pt confirmed family will provide recommended 24/7 supervision.  Pt confirmed she has UNA boots on and will take them home.  Pt will also be followed by Paramedicine program.  Pt educated on daily weights and low salt intake.  CM [facility] and [facility] accepted, pt will private purchase shower stool if needed 01/14/2018, 12:54 PM

## 2018-01-14 NOTE — Progress Notes (Signed)
Patient son had to leave before equipment was delivered. Equipment will be held at nursing station for pick up later.

## 2018-01-14 NOTE — Plan of Care (Signed)
  Problem: Health Behavior/Discharge Planning: Goal: Ability to manage health-related needs will improve Outcome: Progressing   Problem: Clinical Measurements: Goal: Ability to maintain clinical measurements within normal limits will improve Outcome: Progressing Goal: Will remain free from infection Outcome: Progressing Goal: Respiratory complications will improve Outcome: Progressing   Problem: Activity: Goal: Risk for activity intolerance will decrease Outcome: Progressing   

## 2018-01-14 NOTE — Progress Notes (Addendum)
Advanced Heart Failure Rounding Note  PCP-Cardiologist: Lyn Records III, MD   Subjective:    RHC with mildly elevated filling pressures yesterday R>L. No significant PAH. Normal CO.   Weight down to 244. (Down 38 lbs total)   Feeling Ok this am. Leg pain improved.  Appetite improved. Anxious to go home. Denies lightheadedness or dizziness. No orthopnea or PND.   Objective:   Weight Range: 244 lb 6.4 oz (110.9 kg) Body mass index is 40.67 kg/m.   Vital Signs:   Temp:  [98 F (36.7 C)-98.2 F (36.8 C)] 98 F (36.7 C) (06/20 0612) Pulse Rate:  [68-72] 71 (06/20 0612) Resp:  [16-18] 18 (06/20 0612) BP: (102-114)/(59-76) 102/59 (06/20 0612) SpO2:  [96 %-98 %] 96 % (06/20 0612) Weight:  [244 lb 6.4 oz (110.9 kg)] 244 lb 6.4 oz (110.9 kg) (06/20 0612) Last BM Date: 01/13/18  Weight change: Filed Weights   01/12/18 0523 01/13/18 0509 01/14/18 0612  Weight: 246 lb 6.4 oz (111.8 kg) 246 lb 3.2 oz (111.7 kg) 244 lb 6.4 oz (110.9 kg)    Intake/Output:   Intake/Output Summary (Last 24 hours) at 01/14/2018 0933 Last data filed at 01/14/2018 0920 Gross per 24 hour  Intake 1440 ml  Output 2551 ml  Net -1111 ml    Physical Exam    General: NAD HEENT: Normal anicteric Neck: Supple. JVP to ear. Carotids 2+ bilat; no bruits. No thyromegaly or nodule noted. Cor: PMI nondisplaced. RRR, 2/6 TR Lungs: Distant. No wheeze.  Abdomen: Morbidly-obese, non-tender, non-distended, no HSM. No bruits or masses. +BS  Extremities: No cyanosis, clubbing, or rash. Trace to 1+ edema. + UNNA boots. Warm.  Neuro: Alert & orientedx3, cranial nerves grossly intact. moves all 4 extremities w/o difficulty. Affect pleasant   Telemetry   NSR 70-80s, personally reviewed.   Labs    CBC Recent Labs    01/13/18 0525 01/14/18 0452  WBC 5.9 7.4  HGB 11.5* 11.4*  HCT 37.7 37.8  MCV 104.4* 105.0*  PLT 254 279   Basic Metabolic Panel Recent Labs    16/10/96 0525 01/14/18 0452  NA 135  136  K 3.6 3.7  CL 96* 95*  CO2 29 29  GLUCOSE 103* 112*  BUN 35* 37*  CREATININE 1.45* 1.37*  CALCIUM 9.4 9.4  MG 2.2 2.3   Liver Function Tests No results for input(s): AST, ALT, ALKPHOS, BILITOT, PROT, ALBUMIN in the last 72 hours. No results for input(s): LIPASE, AMYLASE in the last 72 hours. Cardiac Enzymes No results for input(s): CKTOTAL, CKMB, CKMBINDEX, TROPONINI in the last 72 hours.  BNP: BNP (last 3 results) Recent Labs    11/09/17 1213 11/18/17 0454 12/31/17 1524  BNP 316.0* 191.3* 408.6*    ProBNP (last 3 results) No results for input(s): PROBNP in the last 8760 hours.   D-Dimer No results for input(s): DDIMER in the last 72 hours. Hemoglobin A1C No results for input(s): HGBA1C in the last 72 hours. Fasting Lipid Panel No results for input(s): CHOL, HDL, LDLCALC, TRIG, CHOLHDL, LDLDIRECT in the last 72 hours. Thyroid Function Tests No results for input(s): TSH, T4TOTAL, T3FREE, THYROIDAB in the last 72 hours.  Invalid input(s): FREET3  Other results:   Imaging    No results found.   Medications:     Scheduled Medications: . allopurinol  100 mg Oral Daily  . apixaban  2.5 mg Oral BID  . aspirin EC  81 mg Oral Daily  . [START ON 01/15/2018] cyanocobalamin  1,000 mcg Intramuscular Weekly  . diclofenac sodium  2 g Topical QID  . ferrous sulfate  325 mg Oral BID WC  . levothyroxine  175 mcg Oral QAC breakfast  . magnesium oxide  400 mg Oral Daily  . midodrine  2.5 mg Oral TID WC  . pantoprazole  40 mg Oral Daily  . potassium chloride  40 mEq Oral Daily  . predniSONE  20 mg Oral Q breakfast   Followed by  . [START ON 01/16/2018] predniSONE  10 mg Oral Q breakfast  . sodium chloride flush  3 mL Intravenous Q12H  . sodium chloride flush  3 mL Intravenous Q12H  . spironolactone  25 mg Oral Daily  . torsemide  60 mg Oral BID    Infusions: . sodium chloride    . 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  Admitted with marked volume overload.   Assessment/Plan   1. A/C Diastolic Heart Failure. 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  - Volume status difficult. With low wedge, suspect cannot get her any more dry - Start torsemide 60 mg BID for home.  - Counseled on need to cut down fluid intake. - [facility] and possible [facility] f/u. - Will arrange HF Clinic - Continue midodrine for BP support  - Reinforced fluid restriction to < 2 L daily, sodium restriction to less than 2000 mg daily, and the importance of daily weights.    2. Suspected OSA - Sleep study completed 12/29/2017. No change.   3. Morbid Obesity  - Body mass index is 40.67 kg/m. - Needs to lose weight.   4. H/O DVT/PE - Continue eliquis 2.5 mg BID per Amplify EXT trial  - No bleeding.   5. H/O of Congenital Bicuspid Valve w/ Severe Stenosis:  - s/p bioprosthetic aortic valve replacement with aortic root placement, per Dr. Cornelius Moraswen June 2018.  - No change to current plan.    6. Hypokalemia.  - K 3.7 today.   7. Prolonged QT -  Avoid QT prolonging drugs. No change.   8. Deconditioning - PT recommending HHPT and 24 hour supervision - No change to current plan.    9. Left ankle pain.  - Suspect acute gout in setting of marked diuresis - Uric acid markedly elevated this admit.  - Prednisone taper per primary team.  - Continue allopurinol 300 daily.   Stable for home from HF perspective.  Follow up made.    Torsemide 60 BID KCL 40 Daily  (lower dose) Cleda DaubSpiro 25 daily (new) Apixaban 2.5 bid (lower dose) Midodrine 2.5 tid (new) Metolazone 2.5 mg prn for weight 255 or greater Allopurinol 300 daily + other non-cardiac meds  Medication concerns reviewed with patient and pharmacy team. Barriers identified: Non-compliance. Likely needs paramedicine.   Length of Stay: [address]14  Luane SchoolMichael Andrew Tillery, PA-C  01/14/2018, 9:33 AM  Advanced Heart Failure Team Pager [phone] (M-F; 7a - 4p)  Please contact [facility] for night-coverage after hours (4p -7a ) and weekends on [email or web address]  Patient seen  and examined with the above-signed Advanced Practice Provider and/or Housestaff. I personally reviewed laboratory data, imaging studies and relevant notes. I independently examined the patient and formulated the important aspects of the plan. I have edited the note to reflect any of my changes or salient points. I have personally discussed the plan with the patient and/or family.  Her weight is down nearly 40 pounds and she feels much better. However neck veins still up. Creatinine bumped with attempts at further diuresis so I think this is about as dry as we can get her given R>L HF.. Will send home on current regimen and follow closely in HF clinic. Continue midodrine for BP support.   Arvilla Meres, MD  6:12 PM

## 2018-01-14 NOTE — Progress Notes (Signed)
Nutrition Brief Note  RD pulled to chart due to LOS (14 days).  Wt Readings from Last 15 Encounters:  01/14/18 244 lb 6.4 oz (110.9 kg)  12/29/17 263 lb (119.3 kg)  12/01/17 265 lb 12.8 oz (120.6 kg)  11/20/17 257 lb (116.6 kg)  05/04/17 272 lb (123.4 kg)  03/12/17 268 lb 6.4 oz (121.7 kg)  02/09/17 264 lb (119.7 kg)  01/26/17 271 lb 1.9 oz (123 kg)  01/10/17 274 lb 3.2 oz (124.4 kg)  12/30/16 275 lb (124.7 kg)  12/26/16 280 lb (127 kg)  12/26/16 279 lb 12.8 oz (126.9 kg)  12/17/16 278 lb (126.1 kg)  12/11/16 288 lb (130.6 kg)  06/12/16 286 lb 6.4 oz (129.9 kg)   Christine DowningJudith M Staleyis a 62 y.o.femalewith medical history significant ofmorbid obesity, hypertension,dCHF, DVT on Eliquis, GERD, CKD-2, aortic stenosis,s/p ofaortic root replacement and valve replacement, iron deficiency anemia, who presents with shortness breath and leg edema.  Pt admitted with CHF exacerbation.   Per chart review, dry weight is around 250#. RD educated pt during last hospitalization in April 2019; pt with significant fluid overload. Suspect wt changes related to fluid changes from CHF.  Body mass index is 40.67 kg/m. Patient meets criteria for extreme obesity, class III based on current BMI.   Current diet order is Heart Healthy with 1200 ml fluid restriction, patient is consuming approximately 100% of meals at this time. Labs and medications reviewed.   No nutrition interventions warranted at this time. If nutrition issues arise, please consult RD.   Manvir Thorson A. Mayford KnifeWilliams, RD, LDN, CDE Pager: [phone] After hours Pager: [phone]

## 2018-01-14 NOTE — Progress Notes (Signed)
Pt is alert and orient sitting in chair with minimal assistance and pain control  Plan to be discharge

## 2018-01-14 NOTE — Progress Notes (Signed)
Occupational Therapy Treatment Patient Details Name: Christine Hicks MRN: [id number] DOB: 10-17-1955 Today's Date: 01/14/2018    History of present illness Pt is a 62 y.o. female admitted 12/31/17 with c/o SOB and LE edema; worked up for CHF. PMHx: multiple falls, morbid obesity, HTN, dCHF, DVT on Eliquis, GERD, CKD-2, aortic stenosis, previous aortic root replacement and valve replacement (2018), iron deficiency anemia.   OT comments  Pt and family repoort that they do have a tub bench in storage and plan to use it. OT educated pt/familt on use of tub bench with simulated demo and handouts with illustrations. Pt to d/c home this afternoon and will have HH therapies. Pt encouraged to let [facility] therapists know upon first visit to practice tub bench transfers  Follow Up Recommendations  No OT follow up;Supervision - Intermittent    Equipment Recommendations  3 in 1 bedside commode;Tub/shower bench    Recommendations for Other Services      Precautions / Restrictions Precautions Precautions: Fall Restrictions Weight Bearing Restrictions: No       Mobility Bed Mobility               General bed mobility comments: in chair upon OT arrival  Transfers   Equipment used: Rolling walker (2 wheeled) Transfers: Sit to/from Stand Sit to Stand: Supervision Stand pivot transfers: Supervision            Balance Overall balance assessment: Mild deficits observed, not formally tested                                         ADL either performed or assessed with clinical judgement   ADL Overall ADL's : Needs assistance/impaired     Grooming: Oral care;Wash/dry face;Supervision/safety;Set up;Standing                             Tub/Shower Transfer Details (indicate cue type and reason): Pt reporting she has falling in her tub during transfers. Educating pt on use of tub trnasfer bench to increase safety anddecrease fall risk. Pt reporting that her  husband has a tub bench in storage and agrees to bring it out for use at home.    General ADL Comments: pt and family repoort that they do have a tub bench in storage and plan to use it. OT educated pt/familt on use of tub bench with simulated demo and handouts with illustrations     Vision Patient Visual Report: No change from baseline     Perception     Praxis      Cognition Arousal/Alertness: Awake/alert Behavior During Therapy: WFL for tasks assessed/performed Overall Cognitive Status: Within Functional Limits for tasks assessed                                          Exercises     Shoulder Instructions       General Comments      Pertinent Vitals/ Pain       Pain Assessment: No/denies pain Pain Score: 0-No pain Pain Intervention(s): Monitored during session  Home Living  Prior Functioning/Environment              Frequency  Min 2X/week        Progress Toward Goals  OT Goals(current goals can now be found in the care plan section)  Progress towards OT goals: Progressing toward goals     Plan Discharge plan remains appropriate    Co-evaluation                 AM-PAC PT "6 Clicks" Daily Activity     Outcome Measure   Help from another person eating meals?: None Help from another person taking care of personal grooming?: A Little Help from another person toileting, which includes using toliet, bedpan, or urinal?: A Little Help from another person bathing (including washing, rinsing, drying)?: A Little Help from another person to put on and taking off regular upper body clothing?: A Little Help from another person to put on and taking off regular lower body clothing?: A Little 6 Click Score: 19    End of Session Equipment Utilized During Treatment: Rolling walker      Activity Tolerance Patient tolerated treatment well   Patient Left in chair;with call  bell/phone within reach   Nurse Communication      Functional Assessment Tool Used: AM-PAC 6 Clicks Daily Activity   Time: 1347-1401 OT Time Calculation (min): 14 min  Charges: OT G-codes **NOT FOR INPATIENT CLASS** Functional Assessment Tool Used: AM-PAC 6 Clicks Daily Activity OT General Charges $OT Visit: 1 Visit OT Treatments $Therapeutic Activity: 8-22 mins     Galen ManilaSpencer, Dajaun Goldring Jeanette 01/14/2018, 2:16 PM

## 2018-01-14 NOTE — Progress Notes (Signed)
Pt waiting on equipment to be delivered to room.

## 2018-01-14 NOTE — Discharge Summary (Signed)
Christine Hicks, is a 62 y.o. female  DOB 1956/05/29  MRN [id number].  Admission date:  12/31/2017  Admitting Physician  Lorretta Harp, MD  Discharge Date:  01/14/2018   Primary MD  Annita Brod, MD  Recommendations for primary care physician for things to follow:   1)Very low-salt diet advised 2)Weigh yourself daily, call if you gain more than 3 pounds in 1 day or more than 5 pounds in 1 week as your diuretic medications may need to be adjusted 3)Limit your Fluid  intake to no more than 60 ounces (1.8 Liters) per day 4)Home Health Nurse will wrap lower extremities with Unna booths twice a week 5) take metolazone 2.5 mg if your weight is more than 255 pounds 6) repeat CBC and BMP blood test with your primary care doctor within a week 7)Please  note that there has been several medication changes to your regimen 8)Avoid ibuprofen/Advil/Aleve/Motrin/Goody Powders/Naproxen/BC powders/Meloxicam/Diclofenac/Indomethacin and other Nonsteroidal anti-inflammatory medications as these will make you more likely to bleed and can cause stomach ulcers, can also cause Kidney problems.   Admission Diagnosis  Acute on chronic systolic congestive heart failure (HCC) [I50.23]  Discharge Diagnosis  Acute on chronic systolic congestive heart failure (HCC) [I50.23]    Principal Problem:   Acute on chronic diastolic heart failure (HCC) Active Problems:   Essential hypertension   DVT, recurrent, lower extremity, chronic, bilateral (HCC)   Iron deficiency anemia   GERD (gastroesophageal reflux disease)   Acute renal failure superimposed on stage 2 chronic kidney disease (HCC)      Past Medical History:  Diagnosis Date  . Acute on chronic diastolic congestive heart failure (HCC)   . Anemia, iron deficiency    negative egd/colonoscopy 11/16/2015  . Aortic stenosis, severe   . Arthritis   . Bicuspid aortic valve   . DVT (deep  venous thrombosis) (HCC)    RLE DVT 11/12/15  . GERD (gastroesophageal reflux disease)   . Heart murmur   . Hypertension   . Insomnia   . Morbid obesity with BMI of 45.0-49.9, adult (HCC)   . PONV (postoperative nausea and vomiting)    took a long time to wake up  . 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 Thyroid disease   . Wears glasses   . Wears partial dentures     Past Surgical History:  Procedure Laterality Date  . 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 AORTIC VALVE REPLACEMENT N/A 01/01/2017   Procedure: MINIMALLY INVASIVE AORTIC VALVE REPLACEMENT (AVR);  Surgeon: Purcell Nails, MD;  Location: [facility] OR;  Service: Open Heart Surgery;  Laterality: N/A;  . ASCENDING AORTIC ROOT REPLACEMENT N/A 01/01/2017   Procedure: ASCENDING AORTIC ROOT REPLACEMENT;  Surgeon: Purcell Nails, MD;  Location: [facility] OR;  Service: Open Heart Surgery;  Laterality: N/A;  . CARDIAC CATHETERIZATION     12/16/16  . COLONOSCOPY N/A 11/16/2015   Procedure: COLONOSCOPY;  Surgeon: Iva Booparl E Gessner, MD;  [facility];  Service: Endoscopy;  Laterality: N/A;  . ESOPHAGOGASTRODUODENOSCOPY N/A 11/16/2015   Procedure: ESOPHAGOGASTRODUODENOSCOPY (EGD);  Surgeon: Iva Booparl E Gessner, MD;  [facility];  Service: Endoscopy;  Laterality: N/A;  . HERNIA REPAIR    . MULTIPLE TOOTH EXTRACTIONS    . RIGHT HEART CATH N/A 01/11/2018   Procedure: RIGHT HEART CATH;  Surgeon: Dolores PattyBensimhon, Daniel R, MD;  Location[facility]VAS[facility] CV LAB;  Service: Cardiovascular;  Laterality: N/A;  . Right leg cellulitis surgery    . TEE WITHOUT CARDIOVERSION N/A 01/01/2017   Procedure: TRANSESOPHAGEAL ECHOCARDIOGRAM (TEE);  Surgeon: Purcell Nailswen, Clarence H, MD;  [facility];  Service: Open Heart Surgery;   Laterality: N/A;      HPI  from the history and physical done on the day of admission:    Patient coming from:  The patient is coming from home.  At baseline, pt is independent for most of ADL.  Chief Complaint: Shortness of breath, leg edema  HPI: Christine Hicks is a 62 y.o. female with medical history significant of morbid obesity, hypertension, dCHF, DVT on Eliquis, GERD, CKD-2, aortic stenosis, s/p of aortic root replacement and valve replacement, iron deficiency anemia, who presents with shortness breath and leg edema.  Patient states that she has shortness of breath in the past several days, which has been progressively getting worse.  She does not have chest pain. She has dry cough.  She also noted worsening bilateral leg edema.  Her primary care doctor increased her torsemide dose to 60 mg twice a day without significant improvement.  Patient states that she has gained weight by approximately 20 pounds since April.  She has generalized weakness.  She states that she fell twice accidentally without significant injury.  She has bruise on the right arm.  No unilateral weakness, numbness or tingling his extremities.  No facial droop or slurred speech.  Denies nausea, vomiting, diarrhea, abdominal pain, symptoms of UTI.  ED Course: pt was found to have BNP 408, WBC 6.1, worsening renal function, oxygen saturation 89% on room air, temperature normal, heart rate in 90s, tachypnea, chest x-ray showed pulmonary vascular congestion, negative CT head for acute intracranial abnormalities.  Patient is admitted to telemetry bed as inpatient.   Hospital Course:    Brief Narrative: Beckey DowningJudith M Staleyis a 62 y.o.femalewith medical history significant ofmorbid obesity, hypertension,dCHF, DVT on Eliquis, GERD, CKD-2, aortic stenosis,s/p ofaortic root replacement and valve replacement, iron deficiency anemia, who presents with shortness breath and leg edema.  Patient states that she has shortness  of breath in the past several days, which has been progressively getting worse. She does not have chest pain. She has dry cough. She also noted worsening bilateral leg edema. Her primary care doctor increased her torsemide dose to 60 mg twice a day without significant improvement. Patient states that she has gained weight by approximately 20 pounds since April. She has generalized weakness.She states that she fell twice accidentally without significant injury. She has bruise on the right arm.No unilateral weakness, numbness or tingling his extremities. No facial droop or slurred speech. Denies nausea, vomiting, diarrhea, abdominal pain, symptoms of UTI. RHC 01/11/18 with mildly elevated pulmonary artery pressures   Plan:- 1)HFpEF/acute on chronic dCHF exacerbation in the setting of OSA/OHS--- echo with RV failure, PAH: EF 65-70%, severe TV regurgitation, increased PASP,  clinically overall improved with diuresis,   RHC with mildly elevated pulmonary artery pressures,  heart failure cardiology consult appreciated, patient was initially treated with Lasix IV 80 mg twice daily, per cardiology team okay to discharge home on  Torsemide, Aldactone, prn metolazone,  fluid restriction a , patient with history of prolonged QT, be judicious with QT prolonging drugs, metoprolol d/ced due to bradycardia.  Creatinine stable around 1.3 with diuresis  2)H/o Recurrent DVT/PE-continue Eliquis 2.5 mg twice daily  3)H/Oof Congenital Bicuspid Valve w/ Severe Stenosis: -s/p bioprosthetic aortic valve replacement with aortic root placement, per Dr. Cornelius Moras June 2018, stable  4)Bil Ankle Pain--- in the setting of aggressive diuresis, clinically suspect gout, left more than the right, uric acid very elevated,  improved on prednisone,  continue allopurinol  5)Generalized weakness/Debility/history of falls--patient prefers discharge home with home health   6)Morbid Obesity/OSA/OHS-patient had sleep study on  12/29/2017, apparently no change in recommendations  7)Hypothyroidism: TSH 0.487 - Continue synthroid  8)Vitamin B12 deficiency: -Okay to restart monthly shots    Code Status : Full    Disposition Plan  : home health services  Consults  : Cardiology   Discharge Condition: stable  Follow UP  Follow-up Information    [facility] Follow up.   Specialty:  Home Health Services Why:  They will continue to do your home health care at your home Contact information: [address] [place] [address] [phone]        Sherald Hess, NP Follow up on 01/27/2018.   Specialty:  Cardiology Why:  Heart Failure Followup at [facility]-10:00-Parking at ER lot (enter under blue "Specialty Clinics" awning) or under [facility] on [facility] [employer], historic buildings entrance, garage code: [id number], elevator 1st floor). Take all am meds, bring all med bottles. Contact information: [address] [place] [address] [phone]           Diet and Activity recommendation:  As advised  Discharge Instructions    Discharge Instructions    (HEART FAILURE PATIENTS) Call MD:  Anytime you have any of the following symptoms: 1) 3 pound weight gain in 24 hours or 5 pounds in 1 week 2) shortness of breath, with or without a dry hacking cough 3) swelling in the hands, feet or stomach 4) if you have to sleep on extra pillows at night in order to breathe.   Complete by:  As directed    Call MD for:  difficulty breathing, headache or visual disturbances   Complete by:  As directed    Call MD for:  persistant dizziness or light-headedness   Complete by:  As directed    Call MD for:  persistant nausea and vomiting   Complete by:  As directed    Call MD for:  severe uncontrolled pain   Complete by:  As directed    Call MD for:  temperature >100.4   Complete by:  As directed    Diet - low sodium heart healthy   Complete by:  As directed    Discharge instructions    Complete by:  As directed    1)Very low-salt diet advised 2)Weigh yourself daily, call if you gain more than 3 pounds in 1 day or more than 5 pounds in 1 week as your diuretic medications may need to be adjusted 3)Limit your Fluid  intake to no more than 60 ounces (1.8 Liters) per day 4)Home Health Nurse will wrap lower extremities with Unna booths twice a week  5) take metolazone 2.5 mg if your weight is more than 255 pounds 6) repeat CBC and BMP blood test with your primary care doctor within a week 7)Please  note that there has been several medication changes to your regimen   Increase activity slowly   Complete by:  As directed         Discharge Medications     Allergies as of 01/14/2018      Reactions   No Known Allergies       Medication List    STOP taking these medications   metoprolol tartrate 25 MG tablet Commonly known as:  LOPRESSOR   zolpidem 5 MG tablet Commonly known as:  AMBIEN     TAKE these medications   acetaminophen 500 MG tablet Commonly known as:  TYLENOL Take 1,000 mg by mouth every 6 (six) hours as needed (arthritis).   allopurinol 100 MG tablet Commonly known as:  ZYLOPRIM Take 1 tablet (100 mg total) by mouth daily. Start taking on:  01/15/2018   apixaban 2.5 MG Tabs tablet Commonly known as:  ELIQUIS Take 1 tablet (2.5 mg total) by mouth 2 (two) times daily. What changed:    medication strength  how much to take   aspirin EC 81 MG tablet Take 81 mg by mouth daily.   cyanocobalamin 1000 MCG/ML injection Commonly known as:  (VITAMIN B-12) Inject 1 mL (1,000 mcg total) into the muscle every 30 (thirty) days.   ferrous sulfate 325 (65 FE) MG tablet Take 1 tablet (325 mg total) by mouth 2 (two) times daily with a meal.   levothyroxine 175 MCG tablet Commonly known as:  SYNTHROID, LEVOTHROID Take 1 tablet (175 mcg total) by mouth daily.   Magnesium 400 MG Caps Take 400 mg by mouth daily.   metolazone 2.5 MG tablet Commonly known  as:  ZAROXOLYN Take 1 tablet as needed if with more than 255 pounds for Fluid/Weight Gain   midodrine 2.5 MG tablet Commonly known as:  PROAMATINE Take 1 tablet (2.5 mg total) by mouth 3 (three) times daily with meals.   pantoprazole 40 MG tablet Commonly known as:  PROTONIX Take 1 tablet (40 mg total) by mouth daily.   potassium chloride SA 20 MEQ tablet Commonly known as:  K-DUR,KLOR-CON Take 2 tablets (40 mEq total) by mouth daily. Start taking on:  01/15/2018 What changed:  when to take this   predniSONE 20 MG tablet Commonly known as:  DELTASONE Take 1 tablet (20 mg total) by mouth daily with breakfast for 4 days. Start taking on:  01/15/2018   spironolactone 25 MG tablet Commonly known as:  ALDACTONE Take 1 tablet (25 mg total) by mouth daily. Start taking on:  01/15/2018   torsemide 20 MG tablet Commonly known as:  DEMADEX Take 3 tablets (60 mg total) by mouth 2 (two) times daily.            Durable Medical Equipment  (From admission, onward)        Start     Ordered   01/14/18 0938  Heart failure home health orders  (Heart failure home health orders / Face to face)  Once    Comments:  Heart Failure Follow-up Care:  Verify follow-up appointments per Patient Discharge Instructions. Confirm transportation arranged. Reconcile home medications with discharge medication list. Remove discontinued medications from use. Assist patient/caregiver to manage medications using pill box. Reinforce low sodium food selection Assessments: Vital signs and oxygen saturation at each visit. Assess home environment for  safety concerns, caregiver support and availability of low-sodium foods. Consult Child psychotherapist, PT/OT, Dietitian, and CNA based on assessments. Perform comprehensive cardiopulmonary assessment. Notify MD for any change in condition or weight gain of 3 pounds in one day or 5 pounds in one week with symptoms. Daily Weights and Symptom Monitoring: Ensure patient has  access to scales. Teach patient/caregiver to weigh daily before breakfast and after voiding using same scale and record.    Teach patient/caregiver to track weight and symptoms and when to notify Provider. Activity: Develop individualized activity plan with patient/caregiver.   Question Answer Comment  Heart Failure Follow-up Care [facility] at [phone]   Obtain the following labs Other see comments   Lab frequency Other see comments   Fax lab results to [facility] at [phone]   Diet Low Sodium Heart Healthy   Fluid restrictions: 2000 mL Fluid   Initiate Heart Failure Clinic Diuretic Protocol to be used by [facility] only ( to be ordered by Heart Failure Team Providers Only) Yes      01/14/18 0938      Major procedures and Radiology Reports - PLEASE review detailed and final reports for all details, in brief -   Dg Chest 2 View  Result Date: 12/31/2017 CLINICAL DATA:  Cough and shortness of breath for 1 week. EXAM: CHEST - 2 VIEW COMPARISON:  11/09/2017 FINDINGS: Cardiomegaly and pulmonary vascular congestion noted with trace bilateral pleural effusions. Median sternotomy again identified. There is no evidence of focal airspace disease, pulmonary edema, suspicious pulmonary nodule/mass or pneumothorax. No acute bony abnormalities are identified. IMPRESSION: Cardiomegaly, pulmonary vascular congestion and trace bilateral pleural effusions. Electronically Signed   By: Harmon Pier M.D.   On: 12/31/2017 16:06   Ct Head Wo Contrast  Result Date: 12/31/2017 CLINICAL DATA:  62 year old female with fall and head trauma. Patient on anticoagulation. EXAM: CT HEAD WITHOUT CONTRAST TECHNIQUE: Contiguous axial images were obtained from the base of the skull through the vertex without intravenous contrast. COMPARISON:  None. FINDINGS: Brain: No evidence of acute infarction, hemorrhage, hydrocephalus, extra-axial collection or mass lesion/mass effect.  Vascular: No hyperdense vessel or unexpected calcification. Skull: Normal. Negative for fracture or focal lesion. Sinuses/Orbits: No acute finding. Other: None. IMPRESSION: Unremarkable noncontrast head CT Electronically Signed   By: Harmon Pier M.D.   On: 12/31/2017 17:07   Dg Foot 2 Views Left  Result Date: 01/05/2018 CLINICAL DATA:  Pain following fall EXAM: LEFT FOOT - 2 VIEW COMPARISON:  None. FINDINGS: Frontal and lateral views were obtained. There is marked soft tissue swelling throughout the dorsum of the foot. Bones are diffusely osteoporotic. There is no evident fracture or dislocation. Joint spaces appear unremarkable. There is spurring in the dorsal midfoot. There is an inferior calcaneal spur. IMPRESSION: Marked soft tissue swelling dorsally. Bones osteoporotic. No fracture or dislocation. Spurring dorsal midfoot. Small inferior calcaneal spur. No appreciable joint space narrowing or erosion. Electronically Signed   By: Bretta Bang III M.D.   On: 01/05/2018 13:29    Micro Results   No results found for this or any previous visit (from the past 240 hour(s)).     Today   Subjective    Kameela Leipold today has no new complaints , ankle pain is much better, breathing much better, no chest pains, voiding well         Patient has been seen and examined prior to discharge   Objective   Blood pressure 137/77, pulse 73,  temperature 97.9 F (36.6 C), resp. rate 18, height 5\' 5"  (1.651 m), weight 110.9 kg (244 lb 6.4 oz), SpO2 100 %.   Intake/Output Summary (Last 24 hours) at 01/14/2018 1204 Last data filed at 01/14/2018 1125 Gross per 24 hour  Intake 1200 ml  Output 3051 ml  Net -1851 ml    Exam Gen:- Awake Alert,  In no apparent distress, obese  HEENT:- [place].AT, No sclera icterus Neck-Supple Neck,No JVD,.  Lungs-  CTAB , good air movement CV- S1, S2 normal Abd-  +ve B.Sounds, Abd Soft, No tenderness,    Extremity/Skin: 1 +  edema,   Merryl Hacker, ankle tenderness/pain  is better Psych-affect is appropriate, oriented x3 Neuro-no new focal deficits, no tremors     Data Review   CBC w Diff:  Lab Results  Component Value Date   WBC 7.4 01/14/2018   HGB 11.4 (L) 01/14/2018   HGB 12.9 01/26/2017   HCT 37.8 01/14/2018   HCT 40.9 01/26/2017   PLT 279 01/14/2018   PLT 252 01/26/2017   LYMPHOPCT 28 11/10/2017   MONOPCT 10 11/10/2017   EOSPCT 3 11/10/2017   BASOPCT 1 11/10/2017    CMP:  Lab Results  Component Value Date   NA 136 01/14/2018   NA 143 12/01/2017   K 3.7 01/14/2018   CL 95 (L) 01/14/2018   CO2 29 01/14/2018   BUN 37 (H) 01/14/2018   BUN 26 12/01/2017   CREATININE 1.37 (H) 01/14/2018   CREATININE 0.93 12/04/2015   PROT 6.1 (L) 01/03/2018   ALBUMIN 2.6 (L) 01/03/2018   BILITOT 1.1 01/03/2018   ALKPHOS 76 01/03/2018   AST 17 01/03/2018   ALT 11 (L) 01/03/2018   Total Discharge time is about 33 minutes  Shon Hale M.D on 01/14/2018 at 12:04 PM  [facility]   Office  [phone]  Voice Recognition Reubin Milan dictation system was used to create this note, attempts have been made to correct errors. Please contact the author with questions and/or clarifications.

## 2018-01-20 ENCOUNTER — Other Ambulatory Visit (HOSPITAL_COMMUNITY): Payer: Self-pay

## 2018-01-20 NOTE — Progress Notes (Signed)
Paramedicine Encounter    Patient ID: Christine Hicks, female    DOB: 08/29/1955, 62 y.o.   MRN: [id number]    Patient Care Team: Annita BrodAsenso, Philip, MD as PCP - General (Internal Medicine) Lyn RecordsSmith, Henry W, MD as PCP - Cardiology (Cardiology) Annita BrodAsenso, Philip, MD (Internal Medicine)  Patient Active Problem List   Diagnosis Date Noted  . PAH (pulmonary artery hypertension) (HCC)   . GERD (gastroesophageal reflux disease) 12/31/2017  . Acute renal failure superimposed on stage 2 chronic kidney disease (HCC) 12/31/2017  . Hypokalemia   . Anasarca 11/09/2017  . 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 Morbid obesity (HCC)   . Acute on chronic diastolic heart failure (HCC) 12/11/2016  . Iron deficiency anemia   . Dyspnea 11/11/2015  . Hypothyroidism 09/24/2009  . Essential hypertension 09/24/2009  . DVT, recurrent, lower extremity, chronic, bilateral (HCC) 09/24/2009    Current Outpatient Medications:  .  allopurinol (ZYLOPRIM) 100 MG tablet, Take 1 tablet (100 mg total) by mouth daily., Disp: 30 tablet, Rfl: 3 .  apixaban (ELIQUIS) 2.5 MG TABS tablet, Take 1 tablet (2.5 mg total) by mouth 2 (two) times daily., Disp: 60 tablet, Rfl: 3 .  aspirin EC 81 MG tablet, Take 81 mg by mouth daily., Disp: , Rfl:  .  cyanocobalamin (,VITAMIN B-12,) 1000 MCG/ML injection, Inject 1 mL (1,000 mcg total) into the muscle every 30 (thirty) days., Disp: 1 mL, Rfl: 0 .  ferrous sulfate 325 (65 FE) MG tablet, Take 1 tablet (325 mg total) by mouth 2 (two) times daily with a meal., Disp: 60 tablet, Rfl: 3 .  levothyroxine (SYNTHROID, LEVOTHROID) 175 MCG tablet, Take 1 tablet (175 mcg total) by mouth daily., Disp: 30 tablet, Rfl: 3 .  Magnesium 400 MG CAPS, Take 400 mg by mouth daily., Disp: 30 capsule, Rfl: 3 .  metolazone (ZAROXOLYN) 2.5 MG tablet, Take 1 tablet as needed if with more than 255 pounds for Fluid/Weight Gain, Disp: 10 tablet, Rfl: 0 .   midodrine (PROAMATINE) 2.5 MG tablet, Take 1 tablet (2.5 mg total) by mouth 3 (three) times daily with meals., Disp: 90 tablet, Rfl: 3 .  pantoprazole (PROTONIX) 40 MG tablet, Take 1 tablet (40 mg total) by mouth daily., Disp: 90 tablet, Rfl: 3 .  potassium chloride SA (K-DUR,KLOR-CON) 20 MEQ tablet, Take 2 tablets (40 mEq total) by mouth daily., Disp: 60 tablet, Rfl: 3 .  spironolactone (ALDACTONE) 25 MG tablet, Take 1 tablet (25 mg total) by mouth daily., Disp: 30 tablet, Rfl: 3 .  acetaminophen (TYLENOL) 500 MG tablet, Take 1,000 mg by mouth every 6 (six) hours as needed (arthritis)., Disp: , Rfl:  .  torsemide (DEMADEX) 20 MG tablet, Take 3 tablets (60 mg total) by mouth 2 (two) times daily., Disp: 180 tablet, Rfl: 3 Allergies  Allergen Reactions  . No Known Allergies      Social History   Socioeconomic History  . Marital status: Significant Other    Spouse name: Not on file  . Number of children: Not on file  . Years of education: Not on file  . Highest education level: Not on file  Occupational History  . Occupation: retired  Engineer, productionocial Needs  . Financial resource strain: Not on file  . Food insecurity:    Worry: Not on file    Inability: Not on file  . Transportation needs:    Medical: Not on file    Non-medical: Not on  file  Tobacco Use  . Smoking status: Former Smoker    Types: Cigarettes  . Smokeless tobacco: Never Used  . Tobacco comment: quit smoking cigarettes > 25 years ago  Substance and Sexual Activity  . Alcohol use: No  . Drug use: No  . Sexual activity: Yes  Lifestyle  . Physical activity:    Days per week: Not on file    Minutes per session: Not on file  . Stress: Not on file  Relationships  . Social connections:    Talks on phone: Not on file    Gets together: Not on file    Attends religious service: Not on file    Active member of club or organization: Not on file    Attends meetings of clubs or organizations: Not on file    Relationship status:  Not on file  . Intimate partner violence:    Fear of current or ex partner: Not on file    Emotionally abused: Not on file    Physically abused: Not on file    Forced sexual activity: Not on file  Other Topics Concern  . Not on file  Social History Narrative  . Not on file    Physical Exam  Pulmonary/Chest: Effort normal. No respiratory distress. She has no wheezes. She has no rales.  Musculoskeletal: She exhibits edema.  Both feet and legs  Skin: Skin is warm and dry. She is not diaphoretic.        Future Appointments  Date Time Provider Department Center  01/27/2018 10:00 AM [facility] PA/NP [facility] None  02/15/2018 12:30 PM Purcell Nails, MD [facility]  03/10/2018 10:20 AM Lyn Records, MD [facility]    ATF pt CAO x4 sitting on the couch reclined back with her feet elevated.  Pt stated that her nurse advised her to elevated her legs and the wraps for her legs were ordered.  Pt filled her own pill box prior to today.  The pill box and rx bottles verified.  Pt stated that she was taken off of ambien but she was still having issues going to sleep and staying asleep.  Her son gave her melatonin (3 mg) last night/ I verified with susie and she stated that melatonin was ok for her to take.  Pt stated that she is trying to limit the amount of fluid that she drinks and has a liter bottle to measure her intake.  Pt agrees to [facility] visits.    [facility] her care team. She stated that the nurse and PT will start next.    BP 116/70 (BP Location: Left Wrist, Patient Position: Sitting, Cuff Size: Normal)   Pulse 82   Resp 16   Wt 240 lb 3.2 oz (109 kg)   LMP  (LMP Unknown)   SpO2 99%   BMI 39.97 kg/m   Emergency contacts: Rayetta Pigg [phone]   Shelonda Saxe, EMT Paramedic 01/20/2018    ACTION: Home visit completed

## 2018-01-22 NOTE — Telephone Encounter (Signed)
[facility]

## 2018-01-26 NOTE — Telephone Encounter (Deleted)
Informed patient of sleep study results and patient understanding was verbalized. Patient understands her sleep study showed no significant sleep apnea.  Left  message on voicemail  informed patient to call back.

## 2018-01-26 NOTE — Progress Notes (Signed)
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   Admitted 4/15 through 11/20/17 with a/c diastolic heart failure. Prior to admit she was n Diuresed 28 liters. Discharged on torsemide 40 mg twice a day. Discharge weight 257 pounds.   She was evaluated by Norma FredricksonLori Gerhardt on 12/01/2017. At that time she was stable and continued on torsemide 40 mg twice a day.   Admitted 12/31/2017 with volume overload. Diuresed with IV lasix and transitioned to torsemide 60 mg twice a day. Diuresed 40 pounds. RHC was performed and showed preserved EF and severely dilated left atrium. Discharge weight 244 pounds.   Today she return for HF follow up. Overall feeling fine. Mild dyspnea with exertion. Denies PND/Orthopnea. Appetite ok. Following low salt diet and limiting fluid intake to < 2 liters per day. No fever or chills. Weight at home 240  pounds. Taking all medications. [facility]    01/01/2018 ECHO Left ventricle: The cavity size was normal. Systolic function was   vigorous. The estimated ejection fraction was in the range of 65%   to 70%. Wall motion was normal; there were no regional wall   motion abnormalities. There was no evidence of elevated   ventricular filling pressure by Doppler parameters. - Right atrium: The atrium was severely dilated. - Tricuspid valve: There was severe regurgitation. - Pulmonary arteries: Systolic pressure was mildly increased. PA   peak pressure: 39 mm Hg (S). RHC 12/2017  RA = 12 RV = 32/11 PA = 33/14 (23) PCW = 12 Fick cardiac output/index = 6.37/2.9 PVR = 1.4 WU Ao sat = 98% PA sat = 71%, 73% SVC sat = 67% Assessment: Volume status much improved after marked diuresis. Has evidence of mildly elevated  R-sided pressures with normal pulmonary pressures c/w RV dysfunction. Left-sided pressures normal.    ROS: All systems negative except as listed in HPI, PMH and Problem List.  SH:  Social History   Socioeconomic History  . Marital status: Significant Other    Spouse name: Not on file  . Number of children: Not on file  . Years of education: Not on file  . Highest education level: Not on file  Occupational History  . Occupation: retired  Engineer, productionocial Needs  . Financial resource strain: Not on file  . Food insecurity:    Worry: Not on file    Inability: Not on file  . Transportation needs:    Medical: Not on file    Non-medical: Not on file  Tobacco Use  . Smoking status: Former Smoker    Types: Cigarettes  . Smokeless tobacco: Never Used  . Tobacco comment: quit smoking cigarettes > 25 years ago  Substance and Sexual Activity  . Alcohol use: No  . Drug use: No  . Sexual activity: Yes  Lifestyle  . Physical activity:    Days per week: Not on file    Minutes per session: Not on file  . Stress: Not on file  Relationships  . Social connections:    Talks on phone: Not on file    Gets together: Not on file    Attends religious service: Not on file    Active member of club or organization: Not on file    Attends meetings of  clubs or organizations: Not on file    Relationship status: Not on file  . Intimate partner violence:    Fear of current or ex partner: Not on file    Emotionally abused: Not on file    Physically abused: Not on file    Forced sexual activity: Not on file  Other Topics Concern  . Not on file  Social History Narrative  . Not on file    FH:  Family History  Problem Relation Age of Onset  . Cardiomyopathy Son   . Heart attack Mother   . Emphysema Father   . Lung cancer Maternal Aunt     Past Medical History:  Diagnosis Date  . Acute on chronic diastolic congestive heart failure (HCC)   . Anemia, iron deficiency    negative egd/colonoscopy  11/16/2015  . Aortic stenosis, severe   . Arthritis   . Bicuspid aortic valve   . DVT (deep venous thrombosis) (HCC)    RLE DVT 11/12/15  . GERD (gastroesophageal reflux disease)   . Heart murmur   . Hypertension   . Insomnia   . Morbid obesity with BMI of 45.0-49.9, adult (HCC)   . PONV (postoperative nausea and vomiting)    took a long time to wake up  . 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 Thyroid disease   . Wears glasses   . Wears partial dentures     Current Outpatient Medications  Medication Sig Dispense Refill  . allopurinol (ZYLOPRIM) 100 MG tablet Take 1 tablet (100 mg total) by mouth daily. 30 tablet 3  . apixaban (ELIQUIS) 2.5 MG TABS tablet Take 1 tablet (2.5 mg total) by mouth 2 (two) times daily. 60 tablet 3  . aspirin EC 81 MG tablet Take 81 mg by mouth daily.    . cyanocobalamin (,VITAMIN B-12,) 1000 MCG/ML injection Inject 1 mL (1,000 mcg total) into the muscle every 30 (thirty) days. 1 mL 0  . ferrous sulfate 325 (65 FE) MG tablet Take 1 tablet (325 mg total) by mouth 2 (two) times daily with a meal. 60 tablet 3  . levothyroxine (SYNTHROID, LEVOTHROID) 175 MCG tablet Take 1 tablet (175 mcg total) by mouth daily. 30 tablet 3  . Magnesium 400 MG CAPS Take 400 mg by mouth daily. 30 capsule 3  . metolazone (ZAROXOLYN) 2.5 MG tablet Take 1 tablet as needed if with more than 255 pounds for Fluid/Weight Gain 10 tablet 0  . midodrine (PROAMATINE) 2.5 MG tablet Take 1 tablet (2.5 mg total) by mouth 3 (three) times daily with meals. 90 tablet 3  . pantoprazole (PROTONIX) 40 MG tablet Take 1 tablet (40 mg total) by mouth daily. 90 tablet 3  . potassium chloride SA (K-DUR,KLOR-CON) 20 MEQ tablet Take 2 tablets (40 mEq total) by mouth daily. 60 tablet 3  . spironolactone  (ALDACTONE) 25 MG tablet Take 1 tablet (25 mg total) by mouth daily. 30 tablet 3  . torsemide (DEMADEX) 20 MG tablet Take 3 tablets (60 mg total) by mouth 2 (two) times daily. 180 tablet 3  . acetaminophen (TYLENOL) 500 MG tablet Take 1,000 mg by mouth every 6 (six) hours as needed (arthritis).     No current facility-administered medications for this encounter.     Vitals:   01/27/18 0944  BP:  138/82  Pulse: 75  SpO2: 94%  Weight: 240 lb 12.8 oz (109.2 kg)   Wt Readings from Last 3 Encounters:  01/27/18 240 lb 12.8 oz (109.2 kg)  01/20/18 240 lb 3.2 oz (109 kg)  01/14/18 244 lb 6.4 oz (110.9 kg)    PHYSICAL EXAM: General:   No resp difficulty. Arrived in a wheel chair HEENT: normal Neck: supple. JVP ~10. Carotids 2+ bilaterally; no bruits. No lymphadenopathy or thryomegaly appreciated. Cor: PMI normal. Regular rate & rhythm. No rubs, gallops . T/6 TR  Lungs: clear Abdomen: soft, nontender, nondistended. No hepatosplenomegaly. No bruits or masses. Good bowel sounds. Extremities: no cyanosis, clubbing, rash, R and LLE 1+ edema Neuro: alert & orientedx3, cranial nerves grossly intact. Moves all 4 extremities w/o difficulty. Affect pleasant.    ASSESSMENT & PLAN: 1. Chronic Diastolic Heart Failure. 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  NYHA IIIb chronically.  - Volume status ok. Continue  torsemide 60 mg BID - May be able to stop midodrine if SBP ok next week with Paramedicine.    2. Suspected OSA - Sleep study completed 12/29/2017. No change.   3. Morbid Obesity - Body mass index is 40.07 kg/m.' - Discussed portion control.   4. H/O DVT/PE - Continue eliquis 2.5 mg BID per Amplify EXT trial  - No bleeding.   5. H/Oof Congenital Bicuspid Valve w/ Severe Stenosis: - s/p bioprosthetic aortic valve replacement with aortic root placement, per Dr. Cornelius Moras June 2018.  - No change to  current plan.    Follow up 4-6 weeks. Continue Paramedicine.  Greater than 50% of the 25 minute visit was spent in counseling/coordination of care regarding disease state education, salt/fluid restriction, sliding scale diuretics, and medication compliance.    Shenee Wignall NP-C  9:51 AM

## 2018-01-27 ENCOUNTER — Ambulatory Visit (HOSPITAL_COMMUNITY)
Admit: 2018-01-27 | Discharge: 2018-01-27 | Disposition: A | Discharge status: Home / Self Care | Payer: BC Managed Care – PPO | Attending: Internal Medicine | Admitting: Internal Medicine

## 2018-01-27 ENCOUNTER — Encounter (HOSPITAL_COMMUNITY): Payer: Self-pay

## 2018-01-27 ENCOUNTER — Other Ambulatory Visit (HOSPITAL_COMMUNITY): Payer: Self-pay

## 2018-01-27 VITALS — BP 138/82 | HR 75 | Wt 240.8 lb

## 2018-01-27 DIAGNOSIS — Z86718 Personal history of other venous thrombosis and embolism: Secondary | ICD-10-CM | POA: Insufficient documentation

## 2018-01-27 DIAGNOSIS — R0602 Shortness of breath: Secondary | ICD-10-CM | POA: Diagnosis not present

## 2018-01-27 DIAGNOSIS — Z79899 Other long term (current) drug therapy: Secondary | ICD-10-CM | POA: Diagnosis not present

## 2018-01-27 DIAGNOSIS — Z801 Family history of malignant neoplasm of trachea, bronchus and lung: Secondary | ICD-10-CM | POA: Diagnosis not present

## 2018-01-27 DIAGNOSIS — Z953 Presence of xenogenic heart valve: Secondary | ICD-10-CM | POA: Diagnosis not present

## 2018-01-27 DIAGNOSIS — Z7982 Long term (current) use of aspirin: Secondary | ICD-10-CM | POA: Insufficient documentation

## 2018-01-27 DIAGNOSIS — E039 Hypothyroidism, unspecified: Secondary | ICD-10-CM | POA: Insufficient documentation

## 2018-01-27 DIAGNOSIS — Q231 Congenital insufficiency of aortic valve: Secondary | ICD-10-CM | POA: Insufficient documentation

## 2018-01-27 DIAGNOSIS — Z825 Family history of asthma and other chronic lower respiratory diseases: Secondary | ICD-10-CM | POA: Diagnosis not present

## 2018-01-27 DIAGNOSIS — Z7901 Long term (current) use of anticoagulants: Secondary | ICD-10-CM | POA: Insufficient documentation

## 2018-01-27 DIAGNOSIS — Z87891 Personal history of nicotine dependence: Secondary | ICD-10-CM | POA: Diagnosis not present

## 2018-01-27 DIAGNOSIS — I5022 Chronic systolic (congestive) heart failure: Secondary | ICD-10-CM

## 2018-01-27 DIAGNOSIS — K219 Gastro-esophageal reflux disease without esophagitis: Secondary | ICD-10-CM | POA: Insufficient documentation

## 2018-01-27 DIAGNOSIS — G4733 Obstructive sleep apnea (adult) (pediatric): Secondary | ICD-10-CM | POA: Diagnosis not present

## 2018-01-27 DIAGNOSIS — Z86711 Personal history of pulmonary embolism: Secondary | ICD-10-CM | POA: Insufficient documentation

## 2018-01-27 DIAGNOSIS — I5032 Chronic diastolic (congestive) heart failure: Secondary | ICD-10-CM | POA: Diagnosis not present

## 2018-01-27 DIAGNOSIS — Z8249 Family history of ischemic heart disease and other diseases of the circulatory system: Secondary | ICD-10-CM | POA: Insufficient documentation

## 2018-01-27 DIAGNOSIS — Z6841 Body Mass Index (BMI) 40.0 and over, adult: Secondary | ICD-10-CM | POA: Insufficient documentation

## 2018-01-27 DIAGNOSIS — I110 Hypertensive heart disease with heart failure: Secondary | ICD-10-CM | POA: Insufficient documentation

## 2018-01-27 LAB — BASIC METABOLIC PANEL
Anion gap: 11 (ref 5–15)
BUN: 18 mg/dL (ref 8–23)
CHLORIDE: 100 mmol/L (ref 98–111)
CO2: 27 mmol/L (ref 22–32)
Calcium: 9.9 mg/dL (ref 8.9–10.3)
Creatinine, Ser: 1.13 mg/dL — ABNORMAL HIGH (ref 0.44–1.00)
GFR calc Af Amer: 59 mL/min — ABNORMAL LOW (ref >60)
GFR calc non Af Amer: 51 mL/min — ABNORMAL LOW (ref >60)
Glucose, Bld: 139 mg/dL — ABNORMAL HIGH (ref 70–99)
POTASSIUM: 4.1 mmol/L (ref 3.5–5.1)
Sodium: 138 mmol/L (ref 135–145)

## 2018-01-27 NOTE — Progress Notes (Signed)
Paramedicine Encounter   Patient ID: Christine Hicks , female,   DOB: August 09, 1955,62 y.o.,  MRN: [id number]   Met patient in clinic today with provider.   [name] stated that the swelling has gone down a lot since the last time she seen her.  PT is still coming; shes eating a lot better.  She's trying to limit the amount of fluids.  **no medication changes during this visit.  Time spent with patient 20 mins  [name], EMT-Paramedic 01/27/2018   ACTION: Next visit planned for wednesday

## 2018-01-27 NOTE — Patient Instructions (Addendum)
Routine lab work today. Will notify you of abnormal results, otherwise no news is good news!  No changes to medication at this time.  Follow up 6 weeks.  _________________________________________________________________ Christine RidgeGarage Code: 1500  Take all medication as prescribed the day of your appointment. Bring all medications with you to your appointment.  Do the following things EVERYDAY: 1) Weigh yourself in the morning before breakfast. Write it down and keep it in a log. 2) Take your medicines as prescribed 3) Eat low salt foods-Limit salt (sodium) to 2000 mg per day.  4) Stay as active as you can everyday 5) Limit all fluids for the day to less than 2 liters

## 2018-01-29 NOTE — Telephone Encounter (Signed)
Informed patient of sleep study results and patient understanding was verbalized. Patient understands her sleep study showed no significant sleep apnea.   Pt is aware and agreeable to normal results.  

## 2018-02-02 ENCOUNTER — Other Ambulatory Visit (HOSPITAL_COMMUNITY): Payer: Self-pay

## 2018-02-02 NOTE — Progress Notes (Signed)
Paramedicine Encounter    Patient ID: Christine Hicks, female    DOB: June 12, 1956, 62 y.o.   MRN: [id number]    Patient Care Team: Annita Brod, MD as PCP - General (Internal Medicine) Lyn Records, MD as PCP - Cardiology (Cardiology) Annita Brod, MD (Internal Medicine)  Patient Active Problem List   Diagnosis Date Noted  . PAH (pulmonary artery hypertension) (HCC)   . GERD (gastroesophageal reflux disease) 12/31/2017  . Acute renal failure superimposed on stage 2 chronic kidney disease (HCC) 12/31/2017  . Hypokalemia   . Anasarca 11/09/2017  . 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 Morbid obesity (HCC)   . Acute on chronic diastolic heart failure (HCC) 12/11/2016  . Iron deficiency anemia   . Dyspnea 11/11/2015  . Hypothyroidism 09/24/2009  . Essential hypertension 09/24/2009  . DVT, recurrent, lower extremity, chronic, bilateral (HCC) 09/24/2009    Current Outpatient Medications:  .  allopurinol (ZYLOPRIM) 100 MG tablet, Take 1 tablet (100 mg total) by mouth daily., Disp: 30 tablet, Rfl: 3 .  apixaban (ELIQUIS) 2.5 MG TABS tablet, Take 1 tablet (2.5 mg total) by mouth 2 (two) times daily., Disp: 60 tablet, Rfl: 3 .  aspirin EC 81 MG tablet, Take 81 mg by mouth daily., Disp: , Rfl:  .  ferrous sulfate 325 (65 FE) MG tablet, Take 1 tablet (325 mg total) by mouth 2 (two) times daily with a meal., Disp: 60 tablet, Rfl: 3 .  levothyroxine (SYNTHROID, LEVOTHROID) 175 MCG tablet, Take 1 tablet (175 mcg total) by mouth daily., Disp: 30 tablet, Rfl: 3 .  Magnesium 400 MG CAPS, Take 400 mg by mouth daily., Disp: 30 capsule, Rfl: 3 .  midodrine (PROAMATINE) 2.5 MG tablet, Take 1 tablet (2.5 mg total) by mouth 3 (three) times daily with meals., Disp: 90 tablet, Rfl: 3 .  pantoprazole (PROTONIX) 40 MG tablet, Take 1 tablet (40 mg total) by mouth daily., Disp: 90 tablet, Rfl: 3 .  potassium chloride SA (K-DUR,KLOR-CON)  20 MEQ tablet, Take 2 tablets (40 mEq total) by mouth daily., Disp: 60 tablet, Rfl: 3 .  spironolactone (ALDACTONE) 25 MG tablet, Take 1 tablet (25 mg total) by mouth daily., Disp: 30 tablet, Rfl: 3 .  torsemide (DEMADEX) 20 MG tablet, Take 3 tablets (60 mg total) by mouth 2 (two) times daily., Disp: 180 tablet, Rfl: 3 .  acetaminophen (TYLENOL) 500 MG tablet, Take 1,000 mg by mouth every 6 (six) hours as needed (arthritis)., Disp: , Rfl:  .  cyanocobalamin (,VITAMIN B-12,) 1000 MCG/ML injection, Inject 1 mL (1,000 mcg total) into the muscle every 30 (thirty) days., Disp: 1 mL, Rfl: 0 .  metolazone (ZAROXOLYN) 2.5 MG tablet, Take 1 tablet as needed if with more than 255 pounds for Fluid/Weight Gain, Disp: 10 tablet, Rfl: 0 Allergies  Allergen Reactions  . No Known Allergies      Social History   Socioeconomic History  . Marital status: Significant Other    Spouse name: Not on file  . Number of children: Not on file  . Years of education: Not on file  . Highest education level: Not on file  Occupational History  . Occupation: retired  Engineer, production  . Financial resource strain: Not on file  . Food insecurity:    Worry: Not on file    Inability: Not on file  . Transportation needs:    Medical: Not on file    Non-medical: Not on  file  Tobacco Use  . Smoking status: Former Smoker    Types: Cigarettes  . Smokeless tobacco: Never Used  . Tobacco comment: quit smoking cigarettes > 25 years ago  Substance and Sexual Activity  . Alcohol use: No  . Drug use: No  . Sexual activity: Yes  Lifestyle  . Physical activity:    Days per week: Not on file    Minutes per session: Not on file  . Stress: Not on file  Relationships  . Social connections:    Talks on phone: Not on file    Gets together: Not on file    Attends religious service: Not on file    Active member of club or organization: Not on file    Attends meetings of clubs or organizations: Not on file    Relationship status:  Not on file  . Intimate partner violence:    Fear of current or ex partner: Not on file    Emotionally abused: Not on file    Physically abused: Not on file    Forced sexual activity: Not on file  Other Topics Concern  . Not on file  Social History Narrative  . Not on file    Physical Exam      Future Appointments  Date Time Provider Department Center  02/15/2018 12:00 PM Purcell Nailswen, Clarence H, MD [facility]  03/10/2018 10:20 AM Lyn RecordsSmith, Henry W, MD [facility]  03/16/2018  9:30 AM [facility] PA/NP [facility] None    ATF pt CAO x4 sitting in her recliner, pt walked to the door with her walker.  She stated that she's getting around "pretty good" since she's started physical therapy.  Pt stated that the nurse from dr. Darleene CleaverAsenso office and PT is still coming.  She denies sob, dizziness and chest pain.  Pt stated that she has decreased the amount of pillows that she sleeps on at night.  She stated that her weight went up a little over the weekend; she went to a wedding and may have over done it.  rx bottles verified and she refilled her pill box (verified).  Pt advised to continue the low sodium foods and limit her fluid intake.  Called Dr. Darleene CleaverAsenso, about vit B injections.  Pt has the injection and wanted to know who would be administering the injection.   Decreased the amount of pillows used over night  BP 114/70 (BP Location: Left Wrist, Patient Position: Sitting, Cuff Size: Normal)   Pulse 76   Resp 16   Wt 238 lb (108 kg)   LMP  (LMP Unknown)   SpO2 96%   BMI 39.61 kg/m   Weight yesterday-239 Last visit weight- 240 (clinic)    Christine Hicks, EMT Paramedic 02/02/2018    ACTION: Home visit completed

## 2018-02-03 ENCOUNTER — Ambulatory Visit: Payer: Self-pay | Admitting: Interventional Cardiology

## 2018-02-09 ENCOUNTER — Other Ambulatory Visit (HOSPITAL_COMMUNITY): Payer: Self-pay

## 2018-02-09 NOTE — Progress Notes (Signed)
Paramedicine Encounter    Patient ID: ZOE GOONAN, female    DOB: 26-Oct-1955, 62 y.o.   MRN: [id number]    Patient Care Team: Annita Brod, MD as PCP - General (Internal Medicine) Lyn Records, MD as PCP - Cardiology (Cardiology) Annita Brod, MD (Internal Medicine)  Patient Active Problem List   Diagnosis Date Noted  . PAH (pulmonary artery hypertension) (HCC)   . GERD (gastroesophageal reflux disease) 12/31/2017  . Acute renal failure superimposed on stage 2 chronic kidney disease (HCC) 12/31/2017  . Hypokalemia   . Anasarca 11/09/2017  . 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 Morbid obesity (HCC)   . Acute on chronic diastolic heart failure (HCC) 12/11/2016  . Iron deficiency anemia   . Dyspnea 11/11/2015  . Hypothyroidism 09/24/2009  . Essential hypertension 09/24/2009  . DVT, recurrent, lower extremity, chronic, bilateral (HCC) 09/24/2009    Current Outpatient Medications:  .  allopurinol (ZYLOPRIM) 100 MG tablet, Take 1 tablet (100 mg total) by mouth daily., Disp: 30 tablet, Rfl: 3 .  apixaban (ELIQUIS) 2.5 MG TABS tablet, Take 1 tablet (2.5 mg total) by mouth 2 (two) times daily., Disp: 60 tablet, Rfl: 3 .  aspirin EC 81 MG tablet, Take 81 mg by mouth daily., Disp: , Rfl:  .  ferrous sulfate 325 (65 FE) MG tablet, Take 1 tablet (325 mg total) by mouth 2 (two) times daily with a meal., Disp: 60 tablet, Rfl: 3 .  levothyroxine (SYNTHROID, LEVOTHROID) 175 MCG tablet, Take 1 tablet (175 mcg total) by mouth daily., Disp: 30 tablet, Rfl: 3 .  Magnesium 400 MG CAPS, Take 400 mg by mouth daily., Disp: 30 capsule, Rfl: 3 .  midodrine (PROAMATINE) 2.5 MG tablet, Take 1 tablet (2.5 mg total) by mouth 3 (three) times daily with meals., Disp: 90 tablet, Rfl: 3 .  potassium chloride SA (K-DUR,KLOR-CON) 20 MEQ tablet, Take 2 tablets (40 mEq total) by mouth daily., Disp: 60 tablet, Rfl: 3 .  acetaminophen  (TYLENOL) 500 MG tablet, Take 1,000 mg by mouth every 6 (six) hours as needed (arthritis)., Disp: , Rfl:  .  cyanocobalamin (,VITAMIN B-12,) 1000 MCG/ML injection, Inject 1 mL (1,000 mcg total) into the muscle every 30 (thirty) days., Disp: 1 mL, Rfl: 0 .  metolazone (ZAROXOLYN) 2.5 MG tablet, Take 1 tablet as needed if with more than 255 pounds for Fluid/Weight Gain, Disp: 10 tablet, Rfl: 0 .  pantoprazole (PROTONIX) 40 MG tablet, Take 1 tablet (40 mg total) by mouth daily., Disp: 90 tablet, Rfl: 3 .  spironolactone (ALDACTONE) 25 MG tablet, Take 1 tablet (25 mg total) by mouth daily., Disp: 30 tablet, Rfl: 3 .  torsemide (DEMADEX) 20 MG tablet, Take 3 tablets (60 mg total) by mouth 2 (two) times daily., Disp: 180 tablet, Rfl: 3 Allergies  Allergen Reactions  . No Known Allergies      Social History   Socioeconomic History  . Marital status: Significant Other    Spouse name: Not on file  . Number of children: Not on file  . Years of education: Not on file  . Highest education level: Not on file  Occupational History  . Occupation: retired  Engineer, production  . Financial resource strain: Not on file  . Food insecurity:    Worry: Not on file    Inability: Not on file  . Transportation needs:    Medical: Not on file    Non-medical: Not on  file  Tobacco Use  . Smoking status: Former Smoker    Types: Cigarettes  . Smokeless tobacco: Never Used  . Tobacco comment: quit smoking cigarettes > 25 years ago  Substance and Sexual Activity  . Alcohol use: No  . Drug use: No  . Sexual activity: Yes  Lifestyle  . Physical activity:    Days per week: Not on file    Minutes per session: Not on file  . Stress: Not on file  Relationships  . Social connections:    Talks on phone: Not on file    Gets together: Not on file    Attends religious service: Not on file    Active member of club or organization: Not on file    Attends meetings of clubs or organizations: Not on file    Relationship  status: Not on file  . Intimate partner violence:    Fear of current or ex partner: Not on file    Emotionally abused: Not on file    Physically abused: Not on file    Forced sexual activity: Not on file  Other Topics Concern  . Not on file  Social History Narrative  . Not on file    Physical Exam  Pulmonary/Chest: Effort normal. No respiratory distress.  Skin: She is not diaphoretic.        Future Appointments  Date Time Provider Department Center  02/15/2018 12:00 PM Purcell Nailswen, Clarence H, MD [facility]  03/10/2018 10:20 AM Lyn RecordsSmith, Henry W, MD [facility]  03/16/2018  9:30 AM [facility] PA/NP [facility] None     Pulse 76   Resp 16   Wt 238 lb 12.8 oz (108.3 kg)   LMP  (LMP Unknown)   SpO2 98%   BMI 39.74 kg/m  BP 112/p  Weight yesterday-238lb Last visit weight-238  ATF pt CAO x4 sitting at the kitchen table refilling her pill box. I verified her pill box and rx bottles while she worked on it.  Pt stated that she ate watermelon yesterday but she ate "pretty good" over the weekend.  Pt stated that she feels stronger and is getting around a lot better.  Pt stated that physical therapy went well today and the PT told her that she's getting stronger.  Pt denies sob, chest pain, and dizziness.  She hasn't increased the amount of pillows she uses at night.  Pt continues to watch her sodium and fluid intake.  RN will give pt the cyanocobalamin (vit B) injection/pt will receive the injections during her doctors app after tomorrow.    Medication ordered: pt starts her pill box on Sunday  allonprinol (filed until Thursday) called in today Spirolactone  Midodrine  Avaree Gilberti, EMT Paramedic [phone] 02/09/2018    ACTION: Home visit completed Next visit planned for next week

## 2018-02-15 ENCOUNTER — Encounter: Payer: Self-pay | Admitting: Thoracic Surgery (Cardiothoracic Vascular Surgery)

## 2018-02-15 ENCOUNTER — Other Ambulatory Visit: Payer: Self-pay

## 2018-02-15 ENCOUNTER — Ambulatory Visit (INDEPENDENT_AMBULATORY_CARE_PROVIDER_SITE_OTHER): Payer: BC Managed Care – PPO | Admitting: Thoracic Surgery (Cardiothoracic Vascular Surgery)

## 2018-02-15 VITALS — BP 130/90 | HR 74 | Resp 16 | Ht 65.0 in | Wt 242.2 lb

## 2018-02-15 DIAGNOSIS — Z954 Presence of other heart-valve replacement: Secondary | ICD-10-CM

## 2018-02-15 NOTE — Progress Notes (Signed)
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 Her early postoperative recovery in the hospital was essentially uncomplicated although relatively slow because of her morbid obesity, limited mobility, and chronic medical problems. She eventually was discharged home on the ninth postoperative day with home health physical therapy.  She was last seen here in our office on 05/04/2017 at which time she was doing fairly well.  She continued to do well until April of this year when she was hospitalized with acute exacerbation of chronic diastolic congestive heart failure and massive volume overload.  She responded well to diuresis, although it was notable and that she diuresed 26 L of fluid during her hospitalization.  Echocardiogram performed at that time revealed normal left ventricular systolic function with ejection  fraction estimated 60 to 65%.  The aortic valve prosthesis was functioning normally with no aortic insufficiency and low transvalvular gradient.  She was noted to have severe tricuspid regurgitation.  She was seen in follow-up by Norma Fredrickson and a sleep study ordered.  She underwent a right heart catheterization by Dr. Gala Romney which revealed mildly elevated right-sided pressures and normal pulmonary  artery pressures consistent with right ventricular dysfunction.  She returns to our office today and reports that she is doing well.  However, she notes that she remains very sensitive to changes in her volume status and she is taking a lot of diuretics.  Overall she feels much improved in comparison with how she felt prior to her surgery 1 year ago.  At present she only gets short of breath with more strenuous exertion, although she admits that she does not exert herself much at all.  She denies any symptoms of chest discomfort.   Current Outpatient Medications  Medication Sig Dispense Refill  . acetaminophen (TYLENOL) 500 MG tablet Take 1,000 mg by mouth every 6 (six) hours as needed (arthritis).    Marland Kitchen allopurinol (ZYLOPRIM) 100 MG tablet Take 1 tablet (100 mg total) by mouth daily. 30 tablet 3  . apixaban (ELIQUIS) 2.5 MG TABS tablet Take 1 tablet (2.5 mg total) by mouth 2 (two) times daily. 60 tablet 3  . aspirin EC 81 MG tablet Take 81 mg by mouth daily.    . cyanocobalamin (,VITAMIN B-12,) 1000 MCG/ML injection Inject 1 mL (1,000 mcg total) into the muscle every 30 (thirty) days. 1 mL 0  . ferrous sulfate 325 (65 FE) MG tablet Take 1 tablet (325 mg total) by mouth 2 (two) times daily with a meal. 60 tablet 3  . levothyroxine (SYNTHROID, LEVOTHROID) 175 MCG tablet Take 1 tablet (175 mcg total) by mouth daily. 30 tablet 3  . Magnesium 400 MG CAPS Take 400 mg by mouth daily. 30 capsule 3  . midodrine (PROAMATINE) 2.5 MG tablet Take 1 tablet (2.5 mg total) by mouth 3 (  three) times daily with meals. 90 tablet 3  . pantoprazole (PROTONIX) 40 MG tablet Take 1 tablet (40 mg total) by mouth daily. 90 tablet 3  . potassium chloride SA (K-DUR,KLOR-CON) 20 MEQ tablet Take 2 tablets (40 mEq total) by mouth daily. 60 tablet 3  . spironolactone (ALDACTONE) 25 MG tablet Take 1 tablet (25 mg total) by mouth daily. 30 tablet 3  . torsemide (DEMADEX) 20 MG tablet Take 3 tablets (60 mg total) by mouth 2 (two)  times daily. 180 tablet 3   No current facility-administered medications for this visit.       Physical Exam:   BP 130/90 (BP Location: Right Arm, Patient Position: Sitting, Cuff Size: Large)   Pulse 74   Resp 16   Ht 5\' 5"  (1.651 m)   Wt 242 lb 3.2 oz (109.9 kg)   LMP  (LMP Unknown)   SpO2 99% Comment: ON RA  BMI 40.30 kg/m   General:  Obese but well-appearing  Chest:   Clear to auscultation  CV:   Regular rate and rhythm without murmur  Incisions:  Completely healed  Abdomen:  Soft nontender  Extremities:  Warm and well-perfused with moderate lower extremity edema  Diagnostic Tests:  Transthoracic Echocardiography  Patient:    Archie PattenStaley, Lynnita M MR #:       [id number] Study Date: 11/10/2017 Gender:     F Age:        [age] Height:     165.1 cm Weight:     138.2 kg BSA:        2.6 m^2 Pt. Status: Room:       3E29C   ADMITTING    Velora MediateYates, Jennifer  ORDERING     Yates, Margarito LinerJennifer  REFERRING    Yates, Jennifer  PERFORMING   [facility]  ATTENDING    [place] North[place], Courteney Lyn  SONOGRAPHER  Sinda DuJoseph Mujalli, RDCS  cc:  ------------------------------------------------------------------- LV EF: 60% -   65%  ------------------------------------------------------------------- Indications:      CHF - 428.0.  ------------------------------------------------------------------- History:   PMH:   Dyspnea.  Congestive heart failure.  Risk factors:  Hypertension.  ------------------------------------------------------------------- Study Conclusions  - Left ventricle: The cavity size was normal. There was severe   focal basal and mild concentric hypertrophy. Systolic function   was normal. The estimated ejection fraction was in the range of   60% to 65%. Wall motion was normal; there were no regional wall   motion abnormalities. Left ventricular diastolic function   parameters were normal. - Aortic valve: Trileaflet; normal thickness, mildly calcified   leaflets. Valve  area (VTI): 0.87 cm^2. Valve area (Vmax): 0.73   cm^2. Valve area (Vmean): 0.8 cm^2. - Left atrium: The atrium was moderately dilated. - Right atrium: The atrium was severely dilated. - Tricuspid valve: There was severe regurgitation. - Pulmonic valve: There was trivial regurgitation. - Pulmonary arteries: PA peak pressure: 46 mm Hg (S).  Impressions:  - The right ventricular systolic pressure was increased consistent   with moderate pulmonary hypertension.  ------------------------------------------------------------------- Study data:  Comparison was made to the study of 01/01/2017.  Study status:  Routine.  Procedure:  Transthoracic echocardiography. Image quality was fair.          Transthoracic echocardiography. M-mode, complete 2D, spectral Doppler, and color Doppler. Birthdate:  Patient birthdate: 1955-12-09.  Age:  Patient is 62 yr old.  Sex:  Gender: female.    BMI: 50.7 kg/m^2.  Blood pressure:   117/71  Patient status:  Inpatient.  Study date:  Study date: 11/10/2017. Study time: 01:48 PM.  Location:  Bedside.  -------------------------------------------------------------------  ------------------------------------------------------------------- Left ventricle:  The cavity size was normal. There was severe focal basal and mild concentric hypertrophy. Systolic function was normal. The estimated ejection fraction was in the range of 60% to 65%. Wall motion was normal; there were no regional wall motion abnormalities. The transmitral flow pattern was normal. The deceleration time of the early transmitral flow velocity was normal. The pulmonary vein flow pattern was normal. The tissue Doppler parameters were normal. Left ventricular diastolic function parameters were normal.  ------------------------------------------------------------------- Aortic valve:   Trileaflet; normal thickness, mildly calcified leaflets. Mobility was not restricted.  Doppler:   Transvalvular velocity was within the normal range. There was no stenosis. There was no regurgitation.    VTI ratio of LVOT to aortic valve: 0.56. Valve area (VTI): 0.87 cm^2. Indexed valve area (VTI): 0.33 cm^2/m^2. Peak velocity ratio of LVOT to aortic valve: 0.47. Valve area (Vmax): 0.73 cm^2. Indexed valve area (Vmax): 0.28 cm^2/m^2. Mean velocity ratio of LVOT to aortic valve: 0.52. Valve area (Vmean): 0.8 cm^2. Indexed valve area (Vmean): 0.31 cm^2/m^2. Mean gradient (S): 9 mm Hg. Peak gradient (S): 17 mm Hg.  ------------------------------------------------------------------- Aorta:  Aortic root: The aortic root was normal in size.  ------------------------------------------------------------------- Mitral valve:   Structurally normal valve.   Mobility was not restricted.  Doppler:  Transvalvular velocity was within the normal range. There was no evidence for stenosis. There was no regurgitation.    Valve area by pressure half-time: 3.44 cm^2. Indexed valve area by pressure half-time: 1.32 cm^2/m^2.    Peak gradient (D): 3 mm Hg.  ------------------------------------------------------------------- Left atrium:  The atrium was moderately dilated.  ------------------------------------------------------------------- Right ventricle:  The cavity size was normal. Wall thickness was normal. Systolic function was normal.  ------------------------------------------------------------------- Pulmonic valve:    Structurally normal valve.   Cusp separation was normal.  Doppler:  Transvalvular velocity was within the normal range. There was no evidence for stenosis. There was trivial regurgitation.  ------------------------------------------------------------------- Tricuspid valve:   Structurally normal valve.    Doppler: Transvalvular velocity was within the normal range. There was severe  regurgitation.  ------------------------------------------------------------------- Pulmonary artery:   The main pulmonary artery was normal-sized. Systolic pressure was within the normal range.  ------------------------------------------------------------------- Right atrium:  The atrium was severely dilated.  ------------------------------------------------------------------- Pericardium:  There was no pericardial effusion.  ------------------------------------------------------------------- Systemic veins: Inferior vena cava: The vessel was dilated. The respirophasic diameter changes were blunted (< 50%), consistent with elevated central venous pressure.  ------------------------------------------------------------------- Measurements   Left ventricle                           Value          Reference  LV ID, ED, PLAX chordal          (L)     38    mm       43 - 52  LV ID, ES, PLAX chordal          (L)     15    mm       23 - 38  LV fx shortening, PLAX chordal           61    %        >=29  LV PW thickness, ED  13    mm       ----------  IVS/LV PW ratio, ED                      1.23           <=1.3  Stroke volume, 2D                        33    ml       ----------  Stroke volume/bsa, 2D                    13    ml/m^2   ----------  LV e&', lateral                           15.9  cm/s     ----------  LV E/e&', lateral                         5.61           ----------  LV e&', medial                            8.16  cm/s     ----------  LV E/e&', medial                          10.93          ----------  LV e&', average                           12.03 cm/s     ----------  LV E/e&', average                         7.41           ----------    Ventricular septum                       Value          Reference  IVS thickness, ED                        16    mm       ----------    LVOT                                     Value          Reference  LVOT  ID, S                               14    mm       ----------  LVOT area                                1.54  cm^2     ----------  LVOT peak velocity, S                    97.9  cm/s     ----------  LVOT mean velocity, S                    70.9  cm/s     ----------  LVOT VTI, S                              21.6  cm       ----------  LVOT peak gradient, S                    4     mm Hg    ----------    Aortic valve                             Value          Reference  Aortic valve peak velocity, S            207   cm/s     ----------  Aortic valve mean velocity, S            137   cm/s     ----------  Aortic valve VTI, S                      38.4  cm       ----------  Aortic mean gradient, S                  9     mm Hg    ----------  Aortic peak gradient, S                  17    mm Hg    ----------  VTI ratio, LVOT/AV                       0.56           ----------  Aortic valve area, VTI                   0.87  cm^2     ----------  Aortic valve area/bsa, VTI               0.33  cm^2/m^2 ----------  Velocity ratio, peak, LVOT/AV            0.47           ----------  Aortic valve area, peak velocity         0.73  cm^2     ----------  Aortic valve area/bsa, peak              0.28  cm^2/m^2 ----------  velocity  Velocity ratio, mean, LVOT/AV            0.52           ----------  Aortic valve area, mean velocity         0.8   cm^2     ----------  Aortic valve area/bsa, mean              0.31  cm^2/m^2 ----------  velocity    Aorta                                    Value          Reference  Aortic root  ID, ED                       28    mm       ----------    Left atrium                              Value          Reference  LA volume, ES, 1-p A4C                   84.9  ml       ----------  LA volume/bsa, ES, 1-p A4C               32.6  ml/m^2   ----------  LA volume, ES, 1-p A2C                   102   ml       ----------  LA volume/bsa, ES, 1-p A2C               39.2  ml/m^2    ----------    Mitral valve                             Value          Reference  Mitral E-wave peak velocity              89.2  cm/s     ----------  Mitral A-wave peak velocity              67.9  cm/s     ----------  Mitral deceleration time                 218   ms       150 - 230  Mitral pressure half-time                64    ms       ----------  Mitral peak gradient, D                  3     mm Hg    ----------  Mitral E/A ratio, peak                   1.3            ----------  Mitral valve area, PHT, DP               3.44  cm^2     ----------  Mitral valve area/bsa, PHT, DP           1.32  cm^2/m^2 ----------    Pulmonary arteries                       Value          Reference  PA pressure, S, DP               (H)     46    mm Hg    <=30    Tricuspid valve                          Value          Reference  Tricuspid regurg peak velocity  280   cm/s     ----------  Tricuspid peak RV-RA gradient            31    mm Hg    ----------    Right atrium                             Value          Reference  RA ID, S-I, ES, A4C              (H)     66.3  mm       34 - 49  RA area, ES, A4C                 (H)     32.5  cm^2     8.3 - 19.5  RA volume, ES, A/L                       133   ml       ----------  RA volume/bsa, ES, A/L                   51.1  ml/m^2   ----------    Systemic veins                           Value          Reference  Estimated CVP                            15    mm Hg    ----------    Right ventricle                          Value          Reference  RV ID, minor axis, ED, A4C base          30    mm       ----------  TAPSE                                    17.1  mm       ----------  RV pressure, S, DP               (H)     46    mm Hg    <=30  RV s&', lateral, S                        7.07  cm/s     ----------  Legend: (L)  and  (H)  mark values outside specified reference  range.  ------------------------------------------------------------------- Prepared and Electronically Authenticated by  Armanda Magic, MD 2019-04-16T14:41:17   Transthoracic Echocardiography  Patient:    Joshalyn, Ancheta MR #:       [id number] Study Date: 01/01/2018 Gender:     F Age:        39 Height:     165.1 cm Weight:     128.1 kg BSA:        2.5 m^2 Pt. Status: Room:       3E04C   Doreatha Martin [id number]  [facility]  Lorretta Harp [id number]  SONOGRAPHER  Delcie Roch, RDCS, CCT  PERFORMING   [facility], Inpatient  ATTENDING    Arby Barrette [id number]  cc:  ------------------------------------------------------------------- LV EF: 65% -   70%  ------------------------------------------------------------------- Indications:      CHF - 428.0.  ------------------------------------------------------------------- History:   PMH:   Dyspnea.  Risk factors:  Chronic kidney disease. Lower extremity edema. Hypertension.  ------------------------------------------------------------------- Study Conclusions  - Left ventricle: The cavity size was normal. Systolic function was   vigorous. The estimated ejection fraction was in the range of 65%   to 70%. Wall motion was normal; there were no regional wall   motion abnormalities. There was no evidence of elevated   ventricular filling pressure by Doppler parameters. - Right atrium: The atrium was severely dilated. - Tricuspid valve: There was severe regurgitation. - Pulmonary arteries: Systolic pressure was mildly increased. PA   peak pressure: 39 mm Hg (S).  ------------------------------------------------------------------- Study data:  Comparison was made to the study of 11/10/2017.  Study status:  Routine.  Procedure:  The patient reported no pain pre or post test. Transthoracic echocardiography. Image quality was adequate. The study was technically difficult, as a result of body habitus.           Transthoracic echocardiography.  M-mode, limited 2D, limited spectral Doppler, and color Doppler.  Birthdate: Patient birthdate: [birth year].  Age:  Patient is 62 yr old.  Sex: Gender: female.    BMI: 47 kg/m^2.  Blood pressure:     100/61 Patient status:  Inpatient.  Study date:  Study date: 01/01/2018. Study time: 02:11 PM.  Location:  Echo laboratory.  -------------------------------------------------------------------  ------------------------------------------------------------------- Left ventricle:  The cavity size was normal. Systolic function was vigorous. The estimated ejection fraction was in the range of 65% to 70%. Wall motion was normal; there were no regional wall motion abnormalities. There was no evidence of elevated ventricular filling pressure by Doppler parameters.  ------------------------------------------------------------------- Aortic valve:   Trileaflet; normal thickness leaflets. Mobility was not restricted.  Doppler:  Transvalvular velocity was within the normal range. There was no stenosis. There was no regurgitation.   ------------------------------------------------------------------- Aorta:  Aortic root: The aortic root was normal in size.  ------------------------------------------------------------------- Mitral valve:   Structurally normal valve.   Mobility was not restricted.  Doppler:  Transvalvular velocity was within the normal range. There was no evidence for stenosis. There was no regurgitation.    Peak gradient (D): 3 mm Hg.  ------------------------------------------------------------------- Left atrium:  The atrium was normal in size.  ------------------------------------------------------------------- Right ventricle:  The cavity size was normal. Wall thickness was normal. Systolic function was normal.  ------------------------------------------------------------------- Pulmonic valve:    Doppler:  Transvalvular velocity was  within the normal range. There was no evidence for stenosis. There was trivial regurgitation.  ------------------------------------------------------------------- Tricuspid valve:   Structurally normal valve.    Doppler: Transvalvular velocity was within the normal range. There was severe regurgitation.  ------------------------------------------------------------------- Pulmonary artery:   The main pulmonary artery was normal-sized. Systolic pressure was mildly increased.  ------------------------------------------------------------------- Right atrium:  The atrium was severely dilated.  ------------------------------------------------------------------- Pericardium:  There was no pericardial effusion.  ------------------------------------------------------------------- Systemic veins: Inferior vena cava: The vessel was dilated. The respirophasic diameter changes were blunted (< 50%), consistent with elevated central venous pressure.  ------------------------------------------------------------------- Measurements   Left ventricle                         Value        Reference  LV ID, ED, PLAX chordal        (L)     36    mm     43 - 52  LV ID, ES, PLAX chordal        (L)     22    mm     23 - 38  LV fx shortening, PLAX chordal         39    %      >=29  LV PW thickness, ED                    10    mm     ----------  IVS/LV PW ratio, ED                    0.9          <=1.3  Stroke volume, 2D                      50    ml     ----------  Stroke volume/bsa, 2D                  20    ml/m^2 ----------  LV e&', lateral                         13.9  cm/s   ----------  LV E/e&', lateral                       5.86         ----------  LV e&', medial                          9.9   cm/s   ----------  LV E/e&', medial                        8.23         ----------  LV e&', average                         11.9  cm/s   ----------  LV E/e&', average                       6.85          ----------    Ventricular septum                     Value        Reference  IVS thickness, ED                      9     mm     ----------    LVOT                                   Value        Reference  LVOT ID, S                             16    mm     ----------  LVOT area  2.01  cm^2   ----------  LVOT peak velocity, S                  111   cm/s   ----------  LVOT mean velocity, S                  73.5  cm/s   ----------  LVOT VTI, S                            24.8  cm     ----------  LVOT peak gradient, S                  5     mm Hg  ----------    Aorta                                  Value        Reference  Ascending aorta ID, A-P, S             24    mm     ----------    Mitral valve                           Value        Reference  Mitral E-wave peak velocity            81.5  cm/s   ----------  Mitral A-wave peak velocity            42.2  cm/s   ----------  Mitral peak gradient, D                3     mm Hg  ----------  Mitral E/A ratio, peak                 1.9          ----------    Pulmonary arteries                     Value        Reference  PA pressure, S, DP             (H)     39    mm Hg  <=30    Tricuspid valve                        Value        Reference  Tricuspid regurg peak velocity         243   cm/s   ----------  Tricuspid peak RV-RA gradient          24    mm Hg  ----------    Right atrium                           Value        Reference  RA ID, S-I, ES, A4C            (H)     71.5  mm     34 - 49  RA area, ES, A4C               (H)     32.8  cm^2   8.3 - 19.5  RA volume, ES, A/L                     125   ml     ----------  RA volume/bsa, ES, A/L                 50    ml/m^2 ----------    Systemic veins                         Value        Reference  Estimated CVP                          15    mm Hg  ----------    Right ventricle                        Value        Reference  TAPSE                                   21.9  mm     ----------  RV pressure, S, DP             (H)     39    mm Hg  <=30  RV s&', lateral, S                      7.94  cm/s   ----------  Legend: (L)  and  (H)  mark values outside specified reference range.  ------------------------------------------------------------------- Prepared and Electronically Authenticated by  Donato Schultz, M.D. 2019-06-07T14:52:08   Impression:  Patient is clinically stable approximately 1 year status post aortic root replacement using a stentless porcine aortic root graft for severe symptomatic aortic stenosis in the setting of small aortic root.  Over the past several months the patient has had problems with acute exacerbation of chronic diastolic congestive heart failure and volume overload requiring massive diuresis.  Follow-up echocardiograms have demonstrated normal function of the bioprosthetic aortic root graft.  Patient does have significant right ventricular dysfunction and severe tricuspid regurgitation.  The patient's tricuspid regurgitation is likely purely secondary.  Of note, she had relatively mild tricuspid regurgitation on TEE performed at the time of her surgery last year.  Plan:  We have not recommended any change the patient's current medications.  I have encouraged the patient to continue to increase her activity as tolerated.  We have reminded her regarding the importance of strict adherence to her diet and salt intake.  Regular exercise and weight loss will be imperative.  Patient will continue to follow-up with Dr. Katrinka Blazing and Norma Fredrickson.  She will call return to our office in the future only should specific problems or questions arise.  I spent in excess of 15 minutes during the conduct of this office consultation and >50% of this time involved direct face-to-face encounter with the patient for counseling and/or coordination of their care.    Salvatore Decent. Cornelius Moras, MD 02/15/2018 12:29 PM

## 2018-02-15 NOTE — Patient Instructions (Addendum)
Continue all previous medications without any changes at this time  Check your weight on a regular basis and keep a log for your records.  Look for signs of fluid overload such as worsening swelling of your lower legs, increased shortness of breath with activity, and/or a dry nonproductive cough.  Discussed these findings with your cardiologist including whether or not you should adjust your fluid pill dosage (diuretic).  You have been advised of the numerous benefits associated with regular exercise and weight loss.  The long term benefits for your overall health and wellbeing cannot be overestimated.  Endocarditis is a potentially serious infection of heart valves or inside lining of the heart.  It occurs more commonly in patients with diseased heart valves (such as patient's with aortic or mitral valve disease) and in patients who have undergone heart valve repair or replacement.  Certain surgical and dental procedures may put you at risk, such as dental cleaning, other dental procedures, or any surgery involving the respiratory, urinary, gastrointestinal tract, gallbladder or prostate gland.   To minimize your chances for develooping endocarditis, maintain good oral health and seek prompt medical attention for any infections involving the mouth, teeth, gums, skin or urinary tract.    Always notify your doctor or dentist about your underlying heart valve condition before having any invasive procedures. You will need to take antibiotics before certain procedures, including all routine dental cleanings or other dental procedures.  Your cardiologist or dentist should prescribe these antibiotics for you to be taken ahead of time.      

## 2018-02-25 ENCOUNTER — Other Ambulatory Visit: Payer: Self-pay | Admitting: Cardiology

## 2018-03-04 ENCOUNTER — Other Ambulatory Visit (HOSPITAL_COMMUNITY): Payer: Self-pay

## 2018-03-04 NOTE — Progress Notes (Signed)
Paramedicine Encounter    Patient ID: Christine Hicks, female    DOB: 04/05/1956, 62 y.o.   MRN: [id number]    Patient Care Team: Annita BrodAsenso, Philip, MD as PCP - General (Internal Medicine) Lyn RecordsSmith, Henry W, MD as PCP - Cardiology (Cardiology) Annita BrodAsenso, Philip, MD (Internal Medicine)  Patient Active Problem List   Diagnosis Date Noted  . PAH (pulmonary artery hypertension) (HCC)   . GERD (gastroesophageal reflux disease) 12/31/2017  . Acute renal failure superimposed on stage 2 chronic kidney disease (HCC) 12/31/2017  . Hypokalemia   . Anasarca 11/09/2017  . 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 Morbid obesity (HCC)   . Acute on chronic diastolic heart failure (HCC) 12/11/2016  . Iron deficiency anemia   . Dyspnea 11/11/2015  . Hypothyroidism 09/24/2009  . Essential hypertension 09/24/2009  . DVT, recurrent, lower extremity, chronic, bilateral (HCC) 09/24/2009    Current Outpatient Medications:  .  allopurinol (ZYLOPRIM) 100 MG tablet, Take 1 tablet (100 mg total) by mouth daily., Disp: 30 tablet, Rfl: 3 .  apixaban (ELIQUIS) 2.5 MG TABS tablet, Take 1 tablet (2.5 mg total) by mouth 2 (two) times daily., Disp: 60 tablet, Rfl: 3 .  aspirin EC 81 MG tablet, Take 81 mg by mouth daily., Disp: , Rfl:  .  cyanocobalamin (,VITAMIN B-12,) 1000 MCG/ML injection, Inject 1 mL (1,000 mcg total) into the muscle every 30 (thirty) days., Disp: 1 mL, Rfl: 0 .  ferrous sulfate 325 (65 FE) MG tablet, Take 1 tablet (325 mg total) by mouth 2 (two) times daily with a meal., Disp: 60 tablet, Rfl: 3 .  levothyroxine (SYNTHROID, LEVOTHROID) 175 MCG tablet, Take 1 tablet (175 mcg total) by mouth daily., Disp: 30 tablet, Rfl: 3 .  Magnesium 400 MG CAPS, Take 400 mg by mouth daily., Disp: 30 capsule, Rfl: 3 .  midodrine (PROAMATINE) 2.5 MG tablet, Take 1 tablet (2.5 mg total) by mouth 3 (three) times daily with meals., Disp: 90 tablet, Rfl: 3 .   pantoprazole (PROTONIX) 40 MG tablet, Take 1 tablet (40 mg total) by mouth daily., Disp: 90 tablet, Rfl: 3 .  potassium chloride SA (K-DUR,KLOR-CON) 20 MEQ tablet, Take 2 tablets (40 mEq total) by mouth daily., Disp: 60 tablet, Rfl: 3 .  spironolactone (ALDACTONE) 25 MG tablet, Take 1 tablet (25 mg total) by mouth daily., Disp: 30 tablet, Rfl: 3 .  torsemide (DEMADEX) 20 MG tablet, Take 3 tablets (60 mg total) by mouth 2 (two) times daily., Disp: 180 tablet, Rfl: 3 .  acetaminophen (TYLENOL) 500 MG tablet, Take 1,000 mg by mouth every 6 (six) hours as needed (arthritis)., Disp: , Rfl:  Allergies  Allergen Reactions  . No Known Allergies      Social History   Socioeconomic History  . Marital status: Significant Other    Spouse name: Not on file  . Number of children: Not on file  . Years of education: Not on file  . Highest education level: Not on file  Occupational History  . Occupation: retired  Engineer, productionocial Needs  . Financial resource strain: Not on file  . Food insecurity:    Worry: Not on file    Inability: Not on file  . Transportation needs:    Medical: Not on file    Non-medical: Not on file  Tobacco Use  . Smoking status: Former Smoker    Types: Cigarettes  . Smokeless tobacco: Never Used  . Tobacco comment: quit  smoking cigarettes > 25 years ago  Substance and Sexual Activity  . Alcohol use: No  . Drug use: No  . Sexual activity: Yes  Lifestyle  . Physical activity:    Days per week: Not on file    Minutes per session: Not on file  . Stress: Not on file  Relationships  . Social connections:    Talks on phone: Not on file    Gets together: Not on file    Attends religious service: Not on file    Active member of club or organization: Not on file    Attends meetings of clubs or organizations: Not on file    Relationship status: Not on file  . Intimate partner violence:    Fear of current or ex partner: Not on file    Emotionally abused: Not on file    Physically  abused: Not on file    Forced sexual activity: Not on file  Other Topics Concern  . Not on file  Social History Narrative  . Not on file    Physical Exam      Future Appointments  Date Time Provider Department Center  03/10/2018 10:20 AM Lyn Records, MD [facility]  03/16/2018  9:30 AM [facility] PA/NP [facility] None     Pulse 93   Resp 16   Wt 236 lb 12.8 oz (107.4 kg)   LMP  (LMP Unknown)   SpO2 94%   BMI 39.41 kg/m  122/p  Weight yesterday-238.8 Last visit weight-238  ATF pt CAO x4 in the restroom; pt stated that she feels better than she has in the past.  She stated that she has been walking around better and went shopping earlier this week.  Pt filled her pill box prior to my arrival. PT will continue to help strenghten her legs.  She stated that PT will continue to come due to her needing to strengthen her legs.  Pt denies sob, chest pain and dizziness.  rx bottles and pill box verified.    Medication ordered: Spirolactone Allopurinol  Christine Hicks, EMT Paramedic [phone] 03/04/2018    ACTION: Home visit completed

## 2018-03-09 NOTE — Progress Notes (Signed)
Cardiology Office Note:    Date:  03/10/2018   ID:  Christine Hicks, DOB 10/24/1955, MRN [id number]  PCP:  Annita BrodAsenso, Philip, MD  Cardiologist:  Lesleigh NoeHenry W Smith III, MD   Referring MD: Annita BrodAsenso, Philip, MD   Chief Complaint  Patient presents with  . Congestive Heart Failure  . Cardiac Valve Problem    History of Present Illness:    Christine Hicks is a 62 y.o. 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   Overall, she feels better.  She denies syncope.  No episodes of chest pain.  She is now able to lie down in bed.  Lower extremity swelling is markedly improved.  She weighs daily.  She is compliant with medical therapy.   Past Medical History:  Diagnosis Date  . Acute on chronic diastolic congestive heart failure (HCC)   . Anemia, iron deficiency    negative egd/colonoscopy 11/16/2015  . Aortic stenosis, severe   . Arthritis   . Bicuspid aortic valve   . DVT (deep venous thrombosis) (HCC)    RLE DVT 11/12/15  . GERD (gastroesophageal reflux disease)   . Heart murmur   . Hypertension   . Insomnia   . Morbid obesity with BMI of 45.0-49.9, adult (HCC)   . PONV (postoperative nausea and vomiting)    took a long time to wake up  . 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 Thyroid disease   . Wears glasses   . Wears partial dentures     Past Surgical History:  Procedure Laterality Date  . 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 AORTIC VALVE REPLACEMENT N/A 01/01/2017   Procedure: MINIMALLY INVASIVE AORTIC VALVE REPLACEMENT (AVR);  Surgeon: Purcell Nailswen,  Clarence H, MD;  [facility];  Service: Open Heart Surgery;  Laterality: N/A;  . ASCENDING AORTIC ROOT REPLACEMENT N/A 01/01/2017   Procedure: ASCENDING AORTIC ROOT REPLACEMENT;  Surgeon: Purcell Nailswen, Clarence H, MD;  [facility];  Service: Open Heart Surgery;  Laterality: N/A;  . CARDIAC CATHETERIZATION     12/16/16  . COLONOSCOPY N/A 11/16/2015   Procedure: COLONOSCOPY;  Surgeon: Iva Booparl E Gessner, MD;  [facility];  Service: Endoscopy;  Laterality: N/A;  . ESOPHAGOGASTRODUODENOSCOPY N/A 11/16/2015   Procedure: ESOPHAGOGASTRODUODENOSCOPY (EGD);  Surgeon: Iva Booparl E Gessner, MD;  [facility];  Service: Endoscopy;  Laterality: N/A;  . HERNIA REPAIR    . MULTIPLE TOOTH EXTRACTIONS    . RIGHT HEART CATH N/A 01/11/2018   Procedure: RIGHT HEART CATH;  Surgeon: Dolores PattyBensimhon, Daniel R, MD;  [facility];  Service: Cardiovascular;  Laterality: N/A;  . Right leg cellulitis surgery    . TEE WITHOUT CARDIOVERSION N/A 01/01/2017   Procedure: TRANSESOPHAGEAL ECHOCARDIOGRAM (TEE);  Surgeon: Purcell Nailswen, Clarence H, MD;  [facility];  Service: Open Heart Surgery;  Laterality: N/A;    Current Medications: Current Meds  Medication Sig  . acetaminophen (TYLENOL) 500 MG tablet Take 1,000 mg by mouth every 6 (six) hours as needed (arthritis).  Marland Kitchen. allopurinol (ZYLOPRIM) 100 MG tablet Take 1 tablet (100 mg total) by mouth daily.  .Marland Kitchen  apixaban (ELIQUIS) 2.5 MG TABS tablet Take 1 tablet (2.5 mg total) by mouth 2 (two) times daily.  Marland Kitchen. aspirin EC 81 MG tablet Take 81 mg by mouth daily.  . cyanocobalamin (,VITAMIN B-12,) 1000 MCG/ML injection Inject 1 mL (1,000 mcg total) into the muscle every 30 (thirty) days.  . ferrous sulfate 325 (65 FE) MG tablet Take 1 tablet (325 mg total) by mouth 2 (two) times daily with a meal.  . levothyroxine (SYNTHROID, LEVOTHROID) 175 MCG tablet Take 1 tablet (175 mcg total) by mouth daily.  . Magnesium 400 MG CAPS Take 400 mg by mouth daily.  . metolazone (ZAROXOLYN)  2.5 MG tablet Take 2.5 mg by mouth daily as needed. (For weight gain more than 255 pounds - fluid/weight gain)  . midodrine (PROAMATINE) 2.5 MG tablet Take 1 tablet (2.5 mg total) by mouth 3 (three) times daily with meals.  . pantoprazole (PROTONIX) 40 MG tablet Take 1 tablet (40 mg total) by mouth daily.  . potassium chloride SA (K-DUR,KLOR-CON) 20 MEQ tablet Take 2 tablets (40 mEq total) by mouth daily.  Marland Kitchen. spironolactone (ALDACTONE) 25 MG tablet Take 1 tablet (25 mg total) by mouth daily.  Marland Kitchen. torsemide (DEMADEX) 20 MG tablet Take 3 tablets (60 mg total) by mouth 2 (two) times daily.     Allergies:   No known allergies   Social History   Socioeconomic History  . Marital status: Significant Other    Spouse name: Not on file  . Number of children: Not on file  . Years of education: Not on file  . Highest education level: Not on file  Occupational History  . Occupation: retired  Engineer, productionocial Needs  . Financial resource strain: Not on file  . Food insecurity:    Worry: Not on file    Inability: Not on file  . Transportation needs:    Medical: Not on file    Non-medical: Not on file  Tobacco Use  . Smoking status: Former Smoker    Types: Cigarettes  . Smokeless tobacco: Never Used  . Tobacco comment: quit smoking cigarettes > 25 years ago  Substance and Sexual Activity  . Alcohol use: No  . Drug use: No  . Sexual activity: Yes  Lifestyle  . Physical activity:    Days per week: Not on file    Minutes per session: Not on file  . Stress: Not on file  Relationships  . Social connections:    Talks on phone: Not on file    Gets together: Not on file    Attends religious service: Not on file    Active member of club or organization: Not on file    Attends meetings of clubs or organizations: Not on file    Relationship status: Not on file  Other Topics Concern  . Not on file  Social History Narrative  . Not on file     Family History: The patient's family history includes  Cardiomyopathy in her son; Emphysema in her father; Heart attack in her mother; Lung cancer in her maternal aunt.  ROS:   Please see the history of present illness.    Shortness of breath if she gets too active.  Chronic lower extremity swelling but markedly improved.  Bilateral leg pain.  Difficulty with balance.  Ambulates with a walker.  All other systems reviewed and are negative.  EKGs/Labs/Other Studies Reviewed:    The following studies were reviewed today: Right heart cath January 11, 2018: Findings:  RA = 12  RV = 32/11 PA = 33/14 (23) PCW = 12 Fick cardiac output/index = 6.37/2.9 PVR = 1.4 WU Ao sat = 98% PA sat = 71%, 73% SVC sat = 67%  Assessment:  Volume status much improved after marked diuresis. Has evidence of mildly elevated R-sided pressures with normal pulmonary pressures c/w RV dysfunction. Left-sided pressures normal.    EKG:  EKG is not ordered today.    Recent Labs: 12/31/2017: B Natriuretic Peptide 408.6 01/02/2018: TSH 0.487 01/03/2018: ALT 11 01/14/2018: Hemoglobin 11.4; Magnesium 2.3; Platelets 279 01/27/2018: BUN 18; Creatinine, Ser 1.13; Potassium 4.1; Sodium 138  Recent Lipid Panel    Component Value Date/Time   CHOL  09/11/2009 0555    124        ATP III CLASSIFICATION:  <200     mg/dL   Desirable  782-956  mg/dL   Borderline High  >=213    mg/dL   High          TRIG 086 (H) 09/11/2009 0555   HDL 14 (L) 09/11/2009 0555   CHOLHDL 8.9 09/11/2009 0555   VLDL 40 09/11/2009 0555   LDLCALC  09/11/2009 0555    70        Total Cholesterol/HDL:CHD Risk Coronary Heart Disease Risk Table                     Men   Women  1/2 Average Risk   3.4   3.3  Average Risk       5.0   4.4  2 X Average Risk   9.6   7.1  3 X Average Risk  23.4   11.0        Use the calculated Patient Ratio above and the CHD Risk Table to determine the patient's CHD Risk.        ATP III CLASSIFICATION (LDL):  <100     mg/dL   Optimal  578-469  mg/dL   Near or Above                     Optimal  130-159  mg/dL   Borderline  629-528  mg/dL   High  >413     mg/dL   Very High    Physical Exam:    VS:  BP 132/80   Pulse 99   Ht 5\' 5"  (1.651 m)   Wt 237 lb 12.8 oz (107.9 kg)   LMP  (LMP Unknown)   BMI 39.57 kg/m     Wt Readings from Last 3 Encounters:  03/10/18 237 lb 12.8 oz (107.9 kg)  03/04/18 236 lb 12.8 oz (107.4 kg)  02/15/18 242 lb 3.2 oz (109.9 kg)     GEN: Obese. well nourished, well developed in no acute distress HEENT: Normal NECK: No JVD. LYMPHATICS: No lymphadenopathy CARDIAC: RRR, 2/6 murmur, no gallop, 1-2+ bilateral ankle edema. VASCULAR: 2+ radial.  Unable to palpate pedal pulses.  No bruits. RESPIRATORY:  Clear to auscultation without rales, wheezing or rhonchi  ABDOMEN: Soft, non-tender, non-distended, No pulsatile mass, MUSCULOSKELETAL: No deformity  SKIN: Warm and dry NEUROLOGIC:  Alert and oriented x 3 PSYCHIATRIC:  Normal affect   ASSESSMENT:    1. Chronic diastolic heart failure (HCC)   2. Morbid obesity (HCC)   3. S/P partial sternotomy for aortic root replacement with stentless porcine aortic root graft    4. Essential hypertension   5. DVT, recurrent, lower extremity, chronic, bilateral (HCC)   6. Obstructive sleep apnea  7. Moderate to severe pulmonary hypertension (HCC)   8. Chronic systolic heart failure (HCC)    PLAN:    In order of problems listed above:  1. Volume status is stable.  She has been well educated in heart failure clinic.  She is weighing daily.  She understands the utility of diuretic therapy.  She still hopes to be able to reduce her medication load but I explained to her that this is unlikely. 2. Unchanged but weight loss has occurred due to diuresis. 3. Normally functioning bioprosthetic aortic valve. 4. Well-controlled 5. Continue chronic anticoagulation therapy 6. Not addressed 7. Improved as noted by right heart cath results recently obtained.  This occurred after diuresis and heart  failure medication adjustment by the advanced heart failure team to whom I am very grateful. I will plan to see her in 4 to 6 months.  Basic metabolic panel was obtained today.  Medication Adjustments/Labs and Tests Ordered: Current medicines are reviewed at length with the patient today.  Concerns regarding medicines are outlined above.  Orders Placed This Encounter  Procedures  . Basic metabolic panel   No orders of the defined types were placed in this encounter.   Patient Instructions  Medication Instructions:  Your physician recommends that you continue on your current medications as directed. Please refer to the Current Medication list given to you today.  Labwork: BMET today  Testing/Procedures: None  Follow-Up: Your physician wants you to follow-up in: 4-6 months with Dr. Katrinka Blazing.  You will receive a reminder letter in the mail two months in advance. If you don't receive a letter, please call our office to schedule the follow-up appointment.   Any Other Special Instructions Will Be Listed Below (If Applicable).     If you need a refill on your cardiac medications before your next appointment, please call your pharmacy.      Signed, Lesleigh Noe, MD  03/10/2018 11:09 AM    [facility]

## 2018-03-10 ENCOUNTER — Ambulatory Visit (INDEPENDENT_AMBULATORY_CARE_PROVIDER_SITE_OTHER): Payer: BC Managed Care – PPO | Admitting: Interventional Cardiology

## 2018-03-10 ENCOUNTER — Encounter: Payer: Self-pay | Admitting: Interventional Cardiology

## 2018-03-10 VITALS — BP 132/80 | HR 99 | Ht 65.0 in | Wt 237.8 lb

## 2018-03-10 DIAGNOSIS — I5022 Chronic systolic (congestive) heart failure: Secondary | ICD-10-CM

## 2018-03-10 DIAGNOSIS — I10 Essential (primary) hypertension: Secondary | ICD-10-CM

## 2018-03-10 DIAGNOSIS — I5032 Chronic diastolic (congestive) heart failure: Secondary | ICD-10-CM | POA: Diagnosis not present

## 2018-03-10 DIAGNOSIS — G4733 Obstructive sleep apnea (adult) (pediatric): Secondary | ICD-10-CM

## 2018-03-10 DIAGNOSIS — Z954 Presence of other heart-valve replacement: Secondary | ICD-10-CM | POA: Diagnosis not present

## 2018-03-10 DIAGNOSIS — I272 Pulmonary hypertension, unspecified: Secondary | ICD-10-CM

## 2018-03-10 DIAGNOSIS — I82503 Chronic embolism and thrombosis of unspecified deep veins of lower extremity, bilateral: Secondary | ICD-10-CM

## 2018-03-10 DIAGNOSIS — IMO0001 Reserved for inherently not codable concepts without codable children: Secondary | ICD-10-CM

## 2018-03-10 LAB — BASIC METABOLIC PANEL
BUN/Creatinine Ratio: 18 (ref 12–28)
BUN: 19 mg/dL (ref 8–27)
CALCIUM: 9.8 mg/dL (ref 8.7–10.3)
CHLORIDE: 100 mmol/L (ref 96–106)
CO2: 24 mmol/L (ref 20–29)
Creatinine, Ser: 1.05 mg/dL — ABNORMAL HIGH (ref 0.57–1.00)
GFR calc Af Amer: 65 mL/min/{1.73_m2} (ref >59)
GFR calc non Af Amer: 57 mL/min/{1.73_m2} — ABNORMAL LOW (ref >59)
GLUCOSE: 114 mg/dL — AB (ref 65–99)
POTASSIUM: 4 mmol/L (ref 3.5–5.2)
Sodium: 139 mmol/L (ref 134–144)

## 2018-03-10 NOTE — Patient Instructions (Signed)
Medication Instructions:  Your physician recommends that you continue on your current medications as directed. Please refer to the Current Medication list given to you today.  Labwork: BMET today  Testing/Procedures: None  Follow-Up: Your physician wants you to follow-up in: 4-6 months with Dr. Katrinka BlazingSmith.  You will receive a reminder letter in the mail two months in advance. If you don't receive a letter, please call our office to schedule the follow-up appointment.   Any Other Special Instructions Will Be Listed Below (If Applicable).     If you need a refill on your cardiac medications before your next appointment, please call your pharmacy.

## 2018-03-11 ENCOUNTER — Other Ambulatory Visit (HOSPITAL_COMMUNITY): Payer: Self-pay

## 2018-03-11 NOTE — Progress Notes (Signed)
Paramedicine Encounter    Patient ID: Christine Hicks, female    DOB: 12/23/1955, 62 y.o.   MRN: [id number]    Patient Care Team: Annita BrodAsenso, Philip, MD as PCP - General (Internal Medicine) Lyn RecordsSmith, Henry W, MD as PCP - Cardiology (Cardiology) Annita BrodAsenso, Philip, MD (Internal Medicine)  Patient Active Problem List   Diagnosis Date Noted  . PAH (pulmonary artery hypertension) (HCC)   . GERD (gastroesophageal reflux disease) 12/31/2017  . Acute renal failure superimposed on stage 2 chronic kidney disease (HCC) 12/31/2017  . Hypokalemia   . Anasarca 11/09/2017  . 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 Morbid obesity (HCC)   . Acute on chronic diastolic heart failure (HCC) 12/11/2016  . Iron deficiency anemia   . Dyspnea 11/11/2015  . Hypothyroidism 09/24/2009  . Essential hypertension 09/24/2009  . DVT, recurrent, lower extremity, chronic, bilateral (HCC) 09/24/2009    Current Outpatient Medications:  .  allopurinol (ZYLOPRIM) 100 MG tablet, Take 1 tablet (100 mg total) by mouth daily., Disp: 30 tablet, Rfl: 3 .  apixaban (ELIQUIS) 2.5 MG TABS tablet, Take 1 tablet (2.5 mg total) by mouth 2 (two) times daily., Disp: 60 tablet, Rfl: 3 .  aspirin EC 81 MG tablet, Take 81 mg by mouth daily., Disp: , Rfl:  .  ferrous sulfate 325 (65 FE) MG tablet, Take 1 tablet (325 mg total) by mouth 2 (two) times daily with a meal., Disp: 60 tablet, Rfl: 3 .  levothyroxine (SYNTHROID, LEVOTHROID) 175 MCG tablet, Take 1 tablet (175 mcg total) by mouth daily., Disp: 30 tablet, Rfl: 3 .  Magnesium 400 MG CAPS, Take 400 mg by mouth daily., Disp: 30 capsule, Rfl: 3 .  midodrine (PROAMATINE) 2.5 MG tablet, Take 1 tablet (2.5 mg total) by mouth 3 (three) times daily with meals., Disp: 90 tablet, Rfl: 3 .  pantoprazole (PROTONIX) 40 MG tablet, Take 1 tablet (40 mg total) by mouth daily., Disp: 90 tablet, Rfl: 3 .  potassium chloride SA (K-DUR,KLOR-CON)  20 MEQ tablet, Take 2 tablets (40 mEq total) by mouth daily., Disp: 60 tablet, Rfl: 3 .  spironolactone (ALDACTONE) 25 MG tablet, Take 1 tablet (25 mg total) by mouth daily., Disp: 30 tablet, Rfl: 3 .  torsemide (DEMADEX) 20 MG tablet, Take 3 tablets (60 mg total) by mouth 2 (two) times daily., Disp: 180 tablet, Rfl: 3 .  acetaminophen (TYLENOL) 500 MG tablet, Take 1,000 mg by mouth every 6 (six) hours as needed (arthritis)., Disp: , Rfl:  .  cyanocobalamin (,VITAMIN B-12,) 1000 MCG/ML injection, Inject 1 mL (1,000 mcg total) into the muscle every 30 (thirty) days., Disp: 1 mL, Rfl: 0 .  metolazone (ZAROXOLYN) 2.5 MG tablet, Take 2.5 mg by mouth daily as needed. (For weight gain more than 255 pounds - fluid/weight gain), Disp: , Rfl:  Allergies  Allergen Reactions  . No Known Allergies      Social History   Socioeconomic History  . Marital status: Significant Other    Spouse name: Not on file  . Number of children: Not on file  . Years of education: Not on file  . Highest education level: Not on file  Occupational History  . Occupation: retired  Engineer, productionocial Needs  . Financial resource strain: Not on file  . Food insecurity:    Worry: Not on file    Inability: Not on file  . Transportation needs:    Medical: Not on file  Non-medical: Not on file  Tobacco Use  . Smoking status: Former Smoker    Types: Cigarettes  . Smokeless tobacco: Never Used  . Tobacco comment: quit smoking cigarettes > 25 years ago  Substance and Sexual Activity  . Alcohol use: No  . Drug use: No  . Sexual activity: Yes  Lifestyle  . Physical activity:    Days per week: Not on file    Minutes per session: Not on file  . Stress: Not on file  Relationships  . Social connections:    Talks on phone: Not on file    Gets together: Not on file    Attends religious service: Not on file    Active member of club or organization: Not on file    Attends meetings of clubs or organizations: Not on file     Relationship status: Not on file  . Intimate partner violence:    Fear of current or ex partner: Not on file    Emotionally abused: Not on file    Physically abused: Not on file    Forced sexual activity: Not on file  Other Topics Concern  . Not on file  Social History Narrative  . Not on file    Physical Exam      Future Appointments  Date Time Provider Department Center  03/16/2018  9:30 AM [facility] PA/NP [facility] None     Pulse 97   Resp 16   Wt 236 lb (107 kg)   LMP  (LMP Unknown)   SpO2 98%   BMI 39.27 kg/m  BP 130/p  Weight yesterday-236.2lb Last visit weight-236  ATF pt CAO x4 walking around without walker. She stated that the PT just left and gave her a good report. She stated that she's walking around a lot better and more often.  Pt stated [place] and was sore the next day.  Pt denies sob, chest pain and dizziness.  Pt has refilled her own pill box without assistance and made no mistakes.  Rx bottles and pill box verified.   Medication ordered: none  Quanah Majka, EMT Paramedic [phone] 03/11/2018    ACTION: Home visit completed

## 2018-03-15 NOTE — Progress Notes (Signed)
PCP: Dr Jacqulynn Cadet  Primary HF Cardiologist: Dr Gala Romney Primary Cardiologist: Dr Katrinka Blazing.    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   Admitted 4/15 through 11/20/17 with a/c diastolic heart failure. Prior to admit she was n Diuresed 28 liters. Discharged on torsemide 40 mg twice a day. Discharge weight 257 pounds.   She was evaluated by Norma Fredrickson on 12/01/2017. At that time she was stable and continued on torsemide 40 mg twice a day.   Admitted 12/31/2017 with volume overload. Diuresed with IV lasix and transitioned to torsemide 60 mg twice a day. Diuresed 40 pounds. RHC was performed and showed preserved EF and severely dilated left atrium. Discharge weight 244 pounds.   Today she returns for HF follow up. Overall feeling much better.  Denies SOB/PND/Orthopnea. Appetite ok. Following low salt diet and limiting fluid intake to less than 2 liters per day. No fever or chills. Weight at home 230-233 pounds. Says she has been doing much better attention to what she is eating. Taking all medications. She is able to fix her own pill box. Followed by Paramedicine.   01/01/2018 ECHO Left ventricle: The cavity size was normal. Systolic function was   vigorous. The estimated ejection fraction was in the range of 65%   to 70%. Wall motion was normal; there were no regional wall   motion abnormalities. There was no evidence of elevated   ventricular filling pressure by Doppler parameters. - Right atrium: The atrium was severely dilated. - Tricuspid valve: There was severe regurgitation. - Pulmonary arteries: Systolic pressure was mildly increased. PA   peak pressure: 39 mm Hg (S). RHC 12/2017  RA = 12 RV = 32/11 PA = 33/14 (23) PCW = 12 Fick cardiac output/index = 6.37/2.9 PVR = 1.4 WU Ao sat = 98% PA sat  = 71%, 73% SVC sat = 67% Assessment: Volume status much improved after marked diuresis. Has evidence of mildly elevated R-sided pressures with normal pulmonary pressures c/w RV dysfunction. Left-sided pressures normal.    ROS: All systems negative except as listed in HPI, PMH and Problem List.  SH:  Social History   Socioeconomic History  . Marital status: Significant Other    Spouse name: Not on file  . Number of children: Not on file  . Years of education: Not on file  . Highest education level: Not on file  Occupational History  . Occupation: retired  Engineer, production  . Financial resource strain: Not on file  . Food insecurity:    Worry: Not on file    Inability: Not on file  . Transportation needs:    Medical: Not on file    Non-medical: Not on file  Tobacco Use  . Smoking status: Former Smoker    Types: Cigarettes  . Smokeless tobacco: Never Used  . Tobacco comment: quit smoking cigarettes > 25 years ago  Substance and Sexual Activity  . Alcohol use: No  . Drug use: No  . Sexual activity: Yes  Lifestyle  . Physical activity:    Days per week: Not on file    Minutes per session: Not on file  . Stress: Not on file  Relationships  . Social connections:    Talks on phone: Not on file    Gets together: Not on file    Attends  religious service: Not on file    Active member of club or organization: Not on file    Attends meetings of clubs or organizations: Not on file    Relationship status: Not on file  . Intimate partner violence:    Fear of current or ex partner: Not on file    Emotionally abused: Not on file    Physically abused: Not on file    Forced sexual activity: Not on file  Other Topics Concern  . Not on file  Social History Narrative  . Not on file    FH:  Family History  Problem Relation Age of Onset  . Cardiomyopathy Son   . Heart attack Mother   . Emphysema Father   . Lung cancer Maternal Aunt     Past Medical History:  Diagnosis Date    . Acute on chronic diastolic congestive heart failure (HCC)   . Anemia, iron deficiency    negative egd/colonoscopy 11/16/2015  . Aortic stenosis, severe   . Arthritis   . Bicuspid aortic valve   . DVT (deep venous thrombosis) (HCC)    RLE DVT 11/12/15  . GERD (gastroesophageal reflux disease)   . Heart murmur   . Hypertension   . Insomnia   . Morbid obesity with BMI of 45.0-49.9, adult (HCC)   . PONV (postoperative nausea and vomiting)    took a long time to wake up  . 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 Thyroid disease   . Wears glasses   . Wears partial dentures     Current Outpatient Medications  Medication Sig Dispense Refill  . allopurinol (ZYLOPRIM) 100 MG tablet Take 1 tablet (100 mg total) by mouth daily. 30 tablet 3  . apixaban (ELIQUIS) 2.5 MG TABS tablet Take 1 tablet (2.5 mg total) by mouth 2 (two) times daily. 60 tablet 3  . aspirin EC 81 MG tablet Take 81 mg by mouth daily.    . midodrine (PROAMATINE) 2.5 MG tablet Take 1 tablet (2.5 mg total) by mouth 3 (three) times daily with meals. 90 tablet 3  . potassium chloride SA (K-DUR,KLOR-CON) 20 MEQ tablet Take 2 tablets (40 mEq total) by mouth daily. 60 tablet 3  . spironolactone (ALDACTONE) 25 MG tablet Take 1 tablet (25 mg total) by mouth daily. 30 tablet 3  . torsemide (DEMADEX) 20 MG tablet Take 3 tablets (60 mg total) by mouth 2 (two) times daily. 180 tablet 3  . acetaminophen (TYLENOL) 500 MG tablet Take 1,000 mg by mouth every 6 (six) hours as needed (arthritis).    . cyanocobalamin (,VITAMIN B-12,) 1000 MCG/ML injection Inject 1 mL (1,000 mcg total) into the muscle every 30 (thirty) days. 1 mL 0  . ferrous sulfate 325 (65 FE) MG tablet Take 1 tablet (325 mg total) by mouth 2 (two) times daily with a meal.  60 tablet 3  . levothyroxine (SYNTHROID, LEVOTHROID) 175 MCG tablet Take 1 tablet (175 mcg total) by mouth daily. 30 tablet 3  . Magnesium 400 MG CAPS Take 400 mg by mouth daily. 30 capsule 3  . metolazone (ZAROXOLYN) 2.5 MG tablet Take 2.5 mg by mouth daily as needed. (For weight gain more than 255 pounds - fluid/weight gain)    . pantoprazole (PROTONIX) 40 MG tablet Take 1 tablet (40 mg  total) by mouth daily. 90 tablet 3   No current facility-administered medications for this encounter.     Vitals:   03/16/18 0942  BP: 96/70  Pulse: 79  SpO2: 97%  Weight: 105.5 kg (232 lb 9.6 oz)   Wt Readings from Last 3 Encounters:  03/16/18 105.5 kg (232 lb 9.6 oz)  03/11/18 107 kg (236 lb)  03/10/18 107.9 kg (237 lb 12.8 oz)    PHYSICAL EXAM: General:  No resp difficulty. Arrived to the clinic in a wheel chair.  HEENT: normal Neck: supple. no JVD. Carotids 2+ bilat; no bruits. No lymphadenopathy or thryomegaly appreciated. Cor: PMI nondisplaced. Regular rate & rhythm. No rubs, gallops. 2/6 TR. Lungs: clear Abdomen: soft, nontender, nondistended. No hepatosplenomegaly. No bruits or masses. Good bowel sounds. Extremities: no cyanosis, clubbing, rash, RLE and LLE trace edema.  Neuro: alert & orientedx3, cranial nerves grossly intact. moves all 4 extremities w/o difficulty. Affect pleasant     ASSESSMENT & PLAN: 1. Chronic Diastolic Heart Failure. 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  -NYHA II. Volume status stable. Continue  torsemide 60 mg BID . SBP in the 90s. Continue midodrine.    2. Suspected OSA - Sleep study completed 12/29/2017. No change.   3. Morbid Obesity - Body mass index is 38.71 kg/m.' -Discussed portion control. She is doing much better with her diet.    4. H/O DVT/PE - Continue eliquis 2.5 mg BID per Amplify EXT trial  - No bleeding problems.    5. H/Oof Congenital  Bicuspid Valve w/ Severe Stenosis: - s/p bioprosthetic aortic valve replacement with aortic root placement, per Dr. Cornelius Moraswen June 2018.  - No change to current plan.    Follow up in 3 month. May d/c from HF clinic next visit. She is an established patient with Dr Katrinka BlazingSmith. Continue short term Paramedicine.    Liliana Brentlinger NP-C  9:59 AM

## 2018-03-16 ENCOUNTER — Other Ambulatory Visit (HOSPITAL_COMMUNITY): Payer: Self-pay | Admitting: *Deleted

## 2018-03-16 ENCOUNTER — Other Ambulatory Visit (HOSPITAL_COMMUNITY): Payer: Self-pay

## 2018-03-16 ENCOUNTER — Encounter (HOSPITAL_COMMUNITY): Payer: Self-pay

## 2018-03-16 ENCOUNTER — Ambulatory Visit (HOSPITAL_COMMUNITY)
Admission: RE | Admit: 2018-03-16 | Discharge: 2018-03-16 | Disposition: A | Discharge status: Home / Self Care | Payer: BC Managed Care – PPO | Source: Ambulatory Visit | Attending: Internal Medicine | Admitting: Internal Medicine

## 2018-03-16 VITALS — BP 96/70 | HR 79 | Wt 232.6 lb

## 2018-03-16 DIAGNOSIS — Z6841 Body Mass Index (BMI) 40.0 and over, adult: Secondary | ICD-10-CM | POA: Diagnosis not present

## 2018-03-16 DIAGNOSIS — D509 Iron deficiency anemia, unspecified: Secondary | ICD-10-CM | POA: Diagnosis not present

## 2018-03-16 DIAGNOSIS — G4700 Insomnia, unspecified: Secondary | ICD-10-CM | POA: Insufficient documentation

## 2018-03-16 DIAGNOSIS — I350 Nonrheumatic aortic (valve) stenosis: Secondary | ICD-10-CM | POA: Insufficient documentation

## 2018-03-16 DIAGNOSIS — Z7982 Long term (current) use of aspirin: Secondary | ICD-10-CM | POA: Diagnosis not present

## 2018-03-16 DIAGNOSIS — R0683 Snoring: Secondary | ICD-10-CM | POA: Diagnosis not present

## 2018-03-16 DIAGNOSIS — Z79899 Other long term (current) drug therapy: Secondary | ICD-10-CM | POA: Insufficient documentation

## 2018-03-16 DIAGNOSIS — E039 Hypothyroidism, unspecified: Secondary | ICD-10-CM | POA: Diagnosis not present

## 2018-03-16 DIAGNOSIS — Z7901 Long term (current) use of anticoagulants: Secondary | ICD-10-CM | POA: Insufficient documentation

## 2018-03-16 DIAGNOSIS — I110 Hypertensive heart disease with heart failure: Secondary | ICD-10-CM | POA: Diagnosis not present

## 2018-03-16 DIAGNOSIS — Z953 Presence of xenogenic heart valve: Secondary | ICD-10-CM | POA: Insufficient documentation

## 2018-03-16 DIAGNOSIS — K219 Gastro-esophageal reflux disease without esophagitis: Secondary | ICD-10-CM | POA: Insufficient documentation

## 2018-03-16 DIAGNOSIS — Z87891 Personal history of nicotine dependence: Secondary | ICD-10-CM | POA: Diagnosis not present

## 2018-03-16 DIAGNOSIS — I5032 Chronic diastolic (congestive) heart failure: Secondary | ICD-10-CM | POA: Insufficient documentation

## 2018-03-16 DIAGNOSIS — Z86711 Personal history of pulmonary embolism: Secondary | ICD-10-CM | POA: Insufficient documentation

## 2018-03-16 DIAGNOSIS — Z86718 Personal history of other venous thrombosis and embolism: Secondary | ICD-10-CM | POA: Insufficient documentation

## 2018-03-16 MED ORDER — FLUTICASONE PROPIONATE 50 MCG/ACT NA SUSP: 1.0000 | Freq: Every day | NASAL | 0 refills | Status: DC

## 2018-03-16 NOTE — Progress Notes (Signed)
Paramedicine Encounter   Patient ID: Christine Hicks , female,   DOB: 1956-04-11,62 y.o.,  MRN: [id number]   Met patient in clinic today with provider [name].   Pt stated that she feels great. Fluid level looks good; weight down.  [name] stated that she will still need to continue to take midodrine, due to her blood pressure is a little lower.  Due to pt improvement, her Advance heart failure clinic visits can be reduced. Due to pt's progress, she CHP visits may discontinue within a month.   Time spent with patient:30 mins No medication change   [place], EMT-Paramedic [phone] 03/16/2018   ACTION: Home visit completed

## 2018-03-16 NOTE — Patient Instructions (Signed)
Follow up in 3 months

## 2018-03-24 ENCOUNTER — Telehealth (HOSPITAL_COMMUNITY): Payer: Self-pay

## 2018-03-24 NOTE — Telephone Encounter (Signed)
Pt called to reschedule our appointment for tomorrow to Friday.  Pt stated that she has plans, therefore we agreed on next Thursday.  Pt stated that she's feeling very well and has no complaints.

## 2018-04-09 ENCOUNTER — Telehealth (HOSPITAL_COMMUNITY): Payer: Self-pay

## 2018-04-09 NOTE — Telephone Encounter (Signed)
I called pt about scheduling her final CHP visit.  She stated that she was ok with doing it over the telephone. I advised her that due to her being able to fill her pill box, obtain her own medications and her weight has been decreasing since she was discharged from the hospital, theres [facility].  She was advised that she can call me at anytime if she has any questions or the heart failure clinic.  She agrees.

## 2018-04-14 ENCOUNTER — Telehealth (HOSPITAL_COMMUNITY): Payer: Self-pay | Admitting: Surgery

## 2018-04-14 NOTE — Telephone Encounter (Signed)
Ms. Christine Hicks will be discharged from the HF [facility] Paramed[facility] Program secondary to successful completion.  She will continue to follow in the AHF Clinic and is aware to call with any worsening symptoms.

## 2018-04-26 IMAGING — DX DG CHEST 1V PORT
1 series · 1 of 1 positions shown · non-contrast
Comparison: January 02, 2017

CLINICAL DATA: Aortic valve replacement. Pleural effusions and
atelectasis

EXAM:
PORTABLE CHEST 1 VIEW

[chest ap]
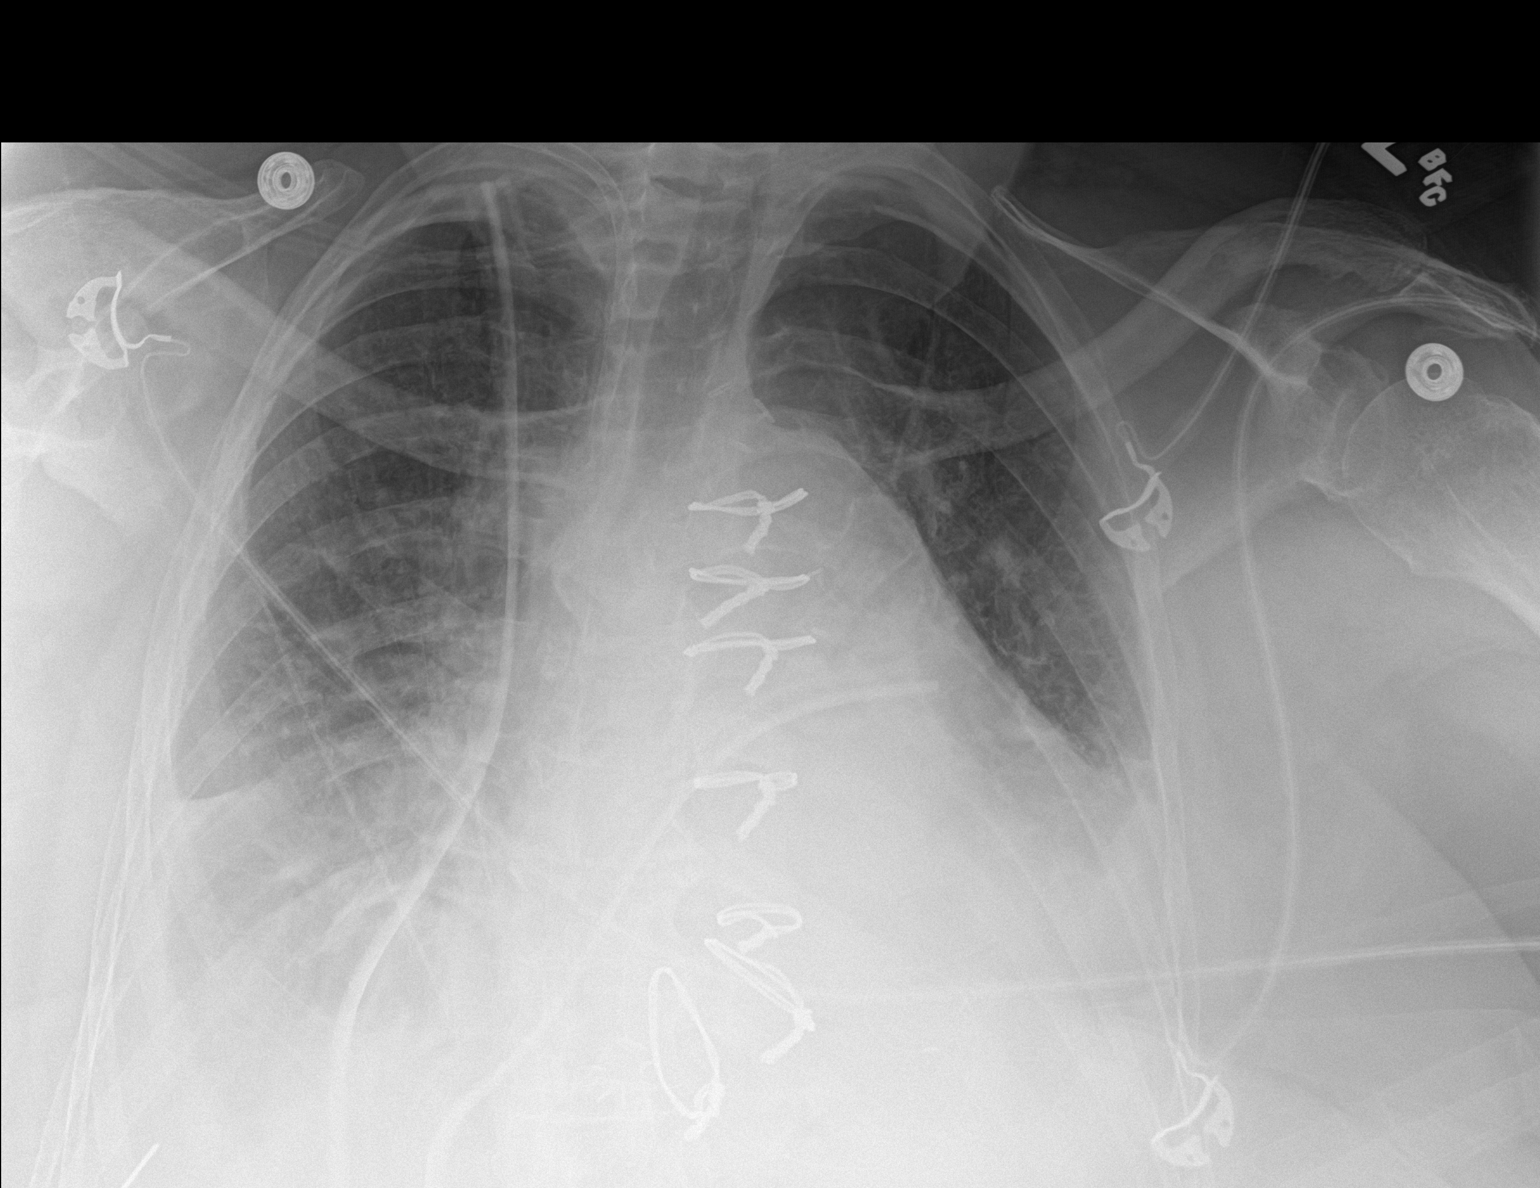

[1 of 1 positions shown; findings below may reference images not displayed]

FINDINGS: Swan-Ganz catheter is been removed. Cordis tip is in the superior
vena cava. Right chest tube and mediastinal drain remain in
unchanged in positions. No pneumothorax evident.

There is cardiomegaly with pulmonary venous hypertension. There are
bilateral pleural effusions with lower lobe atelectatic change
bilaterally, stable. Aortic valve replacement present. There is
aortic atherosclerosis. No bone lesions. No evident adenopathy.
IMPRESSION: No pneumothorax. Tube and catheter positions as described. Evidence
of congestive heart failure, stable. Bilateral lower lobe
atelectatic change appears stable. The cardiac silhouette is stable.
There is aortic atherosclerosis.

## 2018-04-27 ENCOUNTER — Other Ambulatory Visit (HOSPITAL_COMMUNITY): Payer: Self-pay | Admitting: Adult Health

## 2018-04-27 IMAGING — CR DG CHEST 1V PORT
1 series · 1 of 1 positions shown · non-contrast
Comparison: Yesterday

CLINICAL DATA: Aortic valve replacement

EXAM:
PORTABLE CHEST 1 VIEW

[AP]
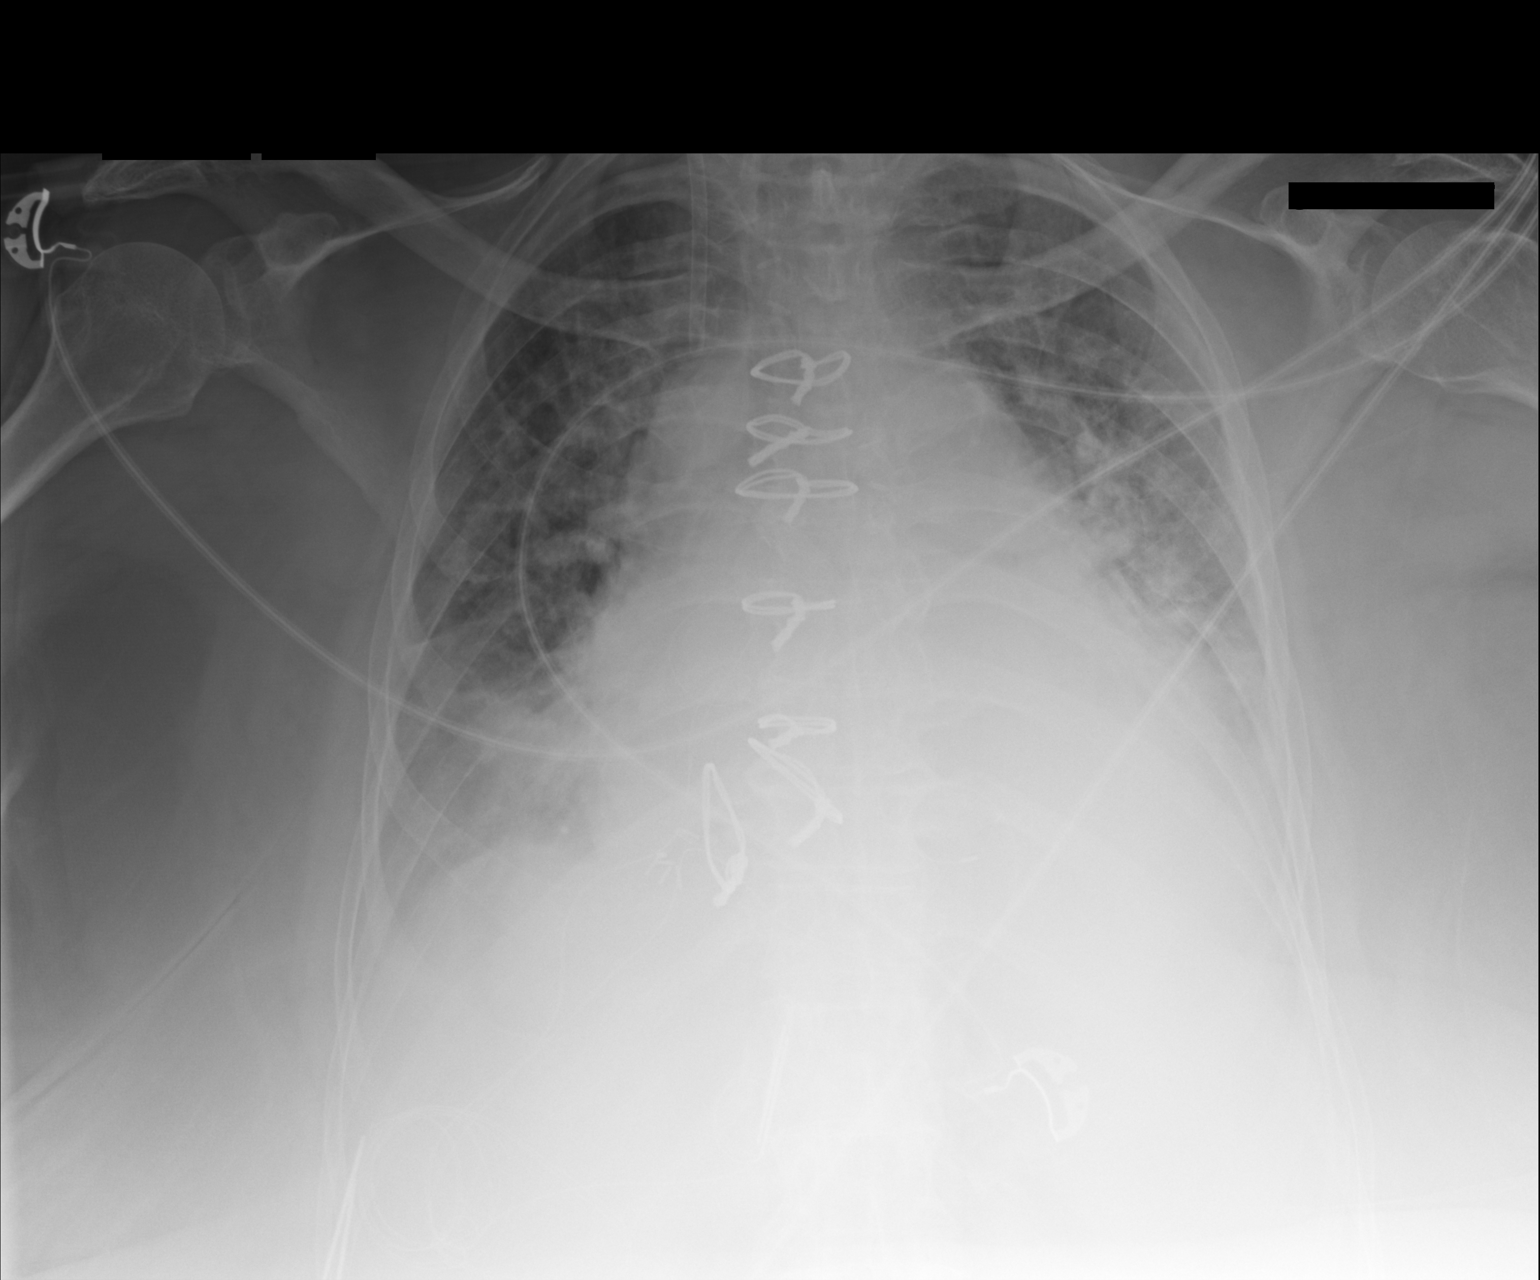

[1 of 1 positions shown; findings below may reference images not displayed]

FINDINGS: Chest tubes removed. No ensuing pneumothorax. Right jugular
introducer sheath stable. Advanced cardiomegaly persists. Vascular
congestion has increased. Bilateral pleural effusions improved.
Postoperative changes.
IMPRESSION: Increasing vascular congestion.

Chest tubes removed without pneumothorax.

## 2018-05-10 ENCOUNTER — Other Ambulatory Visit (HOSPITAL_COMMUNITY): Payer: Self-pay | Admitting: *Deleted

## 2018-05-11 ENCOUNTER — Other Ambulatory Visit (HOSPITAL_COMMUNITY): Payer: Self-pay

## 2018-05-11 ENCOUNTER — Telehealth (HOSPITAL_COMMUNITY): Payer: Self-pay

## 2018-05-11 MED ORDER — POTASSIUM CHLORIDE CRYS ER 20 MEQ PO TBCR: 40.0000 meq | EXTENDED_RELEASE_TABLET | Freq: Every day | ORAL | 3 refills | Status: DC

## 2018-05-11 MED ORDER — METOLAZONE 2.5 MG PO TABS: 2.5000 mg | ORAL_TABLET | Freq: Every day | ORAL | 1 refills | Status: DC | PRN

## 2018-05-11 MED ORDER — APIXABAN 2.5 MG PO TABS: 2.5000 mg | ORAL_TABLET | Freq: Two times a day (BID) | ORAL | 3 refills | Status: DC

## 2018-05-11 MED ORDER — SPIRONOLACTONE 25 MG PO TABS: 25.0000 mg | ORAL_TABLET | Freq: Every day | ORAL | 3 refills | Status: DC

## 2018-05-11 MED ORDER — TORSEMIDE 20 MG PO TABS: 60.0000 mg | ORAL_TABLET | Freq: Two times a day (BID) | ORAL | 3 refills | Status: DC

## 2018-05-11 MED ORDER — MIDODRINE HCL 2.5 MG PO TABS: 2.5000 mg | ORAL_TABLET | Freq: Three times a day (TID) | ORAL | 3 refills | Status: DC

## 2018-05-11 NOTE — Telephone Encounter (Signed)
Pt called and stated that she need a prescription for all of her meds due to her pcp is leaving and she doesn't have another appt for about a month.  I spoke with [name] from the heart failure clinic and she agrees to refill the heart failure related medications.    The rest I asked dr. Pollie Friar to refill.

## 2018-05-12 ENCOUNTER — Other Ambulatory Visit: Payer: Self-pay | Admitting: Interventional Cardiology

## 2018-05-21 ENCOUNTER — Other Ambulatory Visit (HOSPITAL_COMMUNITY): Payer: Self-pay | Admitting: Adult Health

## 2018-05-29 ENCOUNTER — Inpatient Hospital Stay (HOSPITAL_COMMUNITY): Payer: Medicare Other

## 2018-05-29 ENCOUNTER — Encounter (HOSPITAL_COMMUNITY): Payer: Self-pay | Admitting: Emergency Medicine

## 2018-05-29 ENCOUNTER — Inpatient Hospital Stay (HOSPITAL_COMMUNITY)
Admission: EM | Admit: 2018-05-29 | Discharge: 2018-06-03 | DRG: 603 | Disposition: A | Discharge status: Home / Self Care | Payer: Medicare Other | Attending: Internal Medicine | Admitting: Internal Medicine

## 2018-05-29 ENCOUNTER — Emergency Department (HOSPITAL_COMMUNITY): Payer: Medicare Other

## 2018-05-29 DIAGNOSIS — I4510 Unspecified right bundle-branch block: Secondary | ICD-10-CM | POA: Diagnosis present

## 2018-05-29 DIAGNOSIS — M79605 Pain in left leg: Secondary | ICD-10-CM | POA: Diagnosis not present

## 2018-05-29 DIAGNOSIS — L03115 Cellulitis of right lower limb: Secondary | ICD-10-CM | POA: Diagnosis not present

## 2018-05-29 DIAGNOSIS — D631 Anemia in chronic kidney disease: Secondary | ICD-10-CM | POA: Diagnosis not present

## 2018-05-29 DIAGNOSIS — M1990 Unspecified osteoarthritis, unspecified site: Secondary | ICD-10-CM | POA: Diagnosis not present

## 2018-05-29 DIAGNOSIS — I493 Ventricular premature depolarization: Secondary | ICD-10-CM | POA: Diagnosis present

## 2018-05-29 DIAGNOSIS — M7989 Other specified soft tissue disorders: Secondary | ICD-10-CM | POA: Diagnosis not present

## 2018-05-29 DIAGNOSIS — A419 Sepsis, unspecified organism: Secondary | ICD-10-CM | POA: Diagnosis present

## 2018-05-29 DIAGNOSIS — I350 Nonrheumatic aortic (valve) stenosis: Secondary | ICD-10-CM | POA: Diagnosis present

## 2018-05-29 DIAGNOSIS — I130 Hypertensive heart and chronic kidney disease with heart failure and stage 1 through stage 4 chronic kidney disease, or unspecified chronic kidney disease: Secondary | ICD-10-CM | POA: Diagnosis present

## 2018-05-29 DIAGNOSIS — Z6838 Body mass index (BMI) 38.0-38.9, adult: Secondary | ICD-10-CM | POA: Diagnosis not present

## 2018-05-29 DIAGNOSIS — I5030 Unspecified diastolic (congestive) heart failure: Secondary | ICD-10-CM | POA: Diagnosis not present

## 2018-05-29 DIAGNOSIS — N179 Acute kidney failure, unspecified: Secondary | ICD-10-CM | POA: Diagnosis present

## 2018-05-29 DIAGNOSIS — Z953 Presence of xenogenic heart valve: Secondary | ICD-10-CM | POA: Diagnosis not present

## 2018-05-29 DIAGNOSIS — I809 Phlebitis and thrombophlebitis of unspecified site: Secondary | ICD-10-CM | POA: Diagnosis not present

## 2018-05-29 DIAGNOSIS — I482 Chronic atrial fibrillation, unspecified: Secondary | ICD-10-CM | POA: Diagnosis present

## 2018-05-29 DIAGNOSIS — B951 Streptococcus, group B, as the cause of diseases classified elsewhere: Secondary | ICD-10-CM | POA: Diagnosis not present

## 2018-05-29 DIAGNOSIS — L03116 Cellulitis of left lower limb: Secondary | ICD-10-CM | POA: Diagnosis present

## 2018-05-29 DIAGNOSIS — I5032 Chronic diastolic (congestive) heart failure: Secondary | ICD-10-CM | POA: Diagnosis present

## 2018-05-29 DIAGNOSIS — B955 Unspecified streptococcus as the cause of diseases classified elsewhere: Secondary | ICD-10-CM | POA: Diagnosis present

## 2018-05-29 DIAGNOSIS — D509 Iron deficiency anemia, unspecified: Secondary | ICD-10-CM | POA: Diagnosis present

## 2018-05-29 DIAGNOSIS — M791 Myalgia, unspecified site: Secondary | ICD-10-CM

## 2018-05-29 DIAGNOSIS — Z23 Encounter for immunization: Secondary | ICD-10-CM

## 2018-05-29 DIAGNOSIS — K5900 Constipation, unspecified: Secondary | ICD-10-CM | POA: Diagnosis not present

## 2018-05-29 DIAGNOSIS — I8390 Asymptomatic varicose veins of unspecified lower extremity: Secondary | ICD-10-CM | POA: Diagnosis present

## 2018-05-29 DIAGNOSIS — I878 Other specified disorders of veins: Secondary | ICD-10-CM | POA: Diagnosis present

## 2018-05-29 DIAGNOSIS — Z79899 Other long term (current) drug therapy: Secondary | ICD-10-CM

## 2018-05-29 DIAGNOSIS — R7881 Bacteremia: Secondary | ICD-10-CM

## 2018-05-29 DIAGNOSIS — N182 Chronic kidney disease, stage 2 (mild): Secondary | ICD-10-CM | POA: Diagnosis present

## 2018-05-29 DIAGNOSIS — Q248 Other specified congenital malformations of heart: Secondary | ICD-10-CM

## 2018-05-29 DIAGNOSIS — I8310 Varicose veins of unspecified lower extremity with inflammation: Secondary | ICD-10-CM | POA: Diagnosis present

## 2018-05-29 DIAGNOSIS — I951 Orthostatic hypotension: Secondary | ICD-10-CM | POA: Diagnosis present

## 2018-05-29 DIAGNOSIS — Z7982 Long term (current) use of aspirin: Secondary | ICD-10-CM

## 2018-05-29 DIAGNOSIS — Z801 Family history of malignant neoplasm of trachea, bronchus and lung: Secondary | ICD-10-CM

## 2018-05-29 DIAGNOSIS — R7989 Other specified abnormal findings of blood chemistry: Secondary | ICD-10-CM

## 2018-05-29 DIAGNOSIS — L814 Other melanin hyperpigmentation: Secondary | ICD-10-CM | POA: Diagnosis not present

## 2018-05-29 DIAGNOSIS — R509 Fever, unspecified: Secondary | ICD-10-CM | POA: Diagnosis present

## 2018-05-29 DIAGNOSIS — Z87891 Personal history of nicotine dependence: Secondary | ICD-10-CM | POA: Diagnosis not present

## 2018-05-29 DIAGNOSIS — Z79890 Hormone replacement therapy (postmenopausal): Secondary | ICD-10-CM

## 2018-05-29 DIAGNOSIS — K219 Gastro-esophageal reflux disease without esophagitis: Secondary | ICD-10-CM | POA: Diagnosis present

## 2018-05-29 DIAGNOSIS — Z952 Presence of prosthetic heart valve: Secondary | ICD-10-CM | POA: Diagnosis not present

## 2018-05-29 DIAGNOSIS — I959 Hypotension, unspecified: Secondary | ICD-10-CM

## 2018-05-29 DIAGNOSIS — Z954 Presence of other heart-valve replacement: Secondary | ICD-10-CM | POA: Diagnosis not present

## 2018-05-29 DIAGNOSIS — I340 Nonrheumatic mitral (valve) insufficiency: Secondary | ICD-10-CM | POA: Diagnosis not present

## 2018-05-29 DIAGNOSIS — Z8249 Family history of ischemic heart disease and other diseases of the circulatory system: Secondary | ICD-10-CM

## 2018-05-29 DIAGNOSIS — M109 Gout, unspecified: Secondary | ICD-10-CM | POA: Diagnosis present

## 2018-05-29 DIAGNOSIS — Z7901 Long term (current) use of anticoagulants: Secondary | ICD-10-CM

## 2018-05-29 DIAGNOSIS — I8392 Asymptomatic varicose veins of left lower extremity: Secondary | ICD-10-CM | POA: Diagnosis not present

## 2018-05-29 DIAGNOSIS — Z825 Family history of asthma and other chronic lower respiratory diseases: Secondary | ICD-10-CM

## 2018-05-29 DIAGNOSIS — Z86718 Personal history of other venous thrombosis and embolism: Secondary | ICD-10-CM

## 2018-05-29 DIAGNOSIS — Z8774 Personal history of (corrected) congenital malformations of heart and circulatory system: Secondary | ICD-10-CM | POA: Diagnosis not present

## 2018-05-29 DIAGNOSIS — R011 Cardiac murmur, unspecified: Secondary | ICD-10-CM | POA: Diagnosis not present

## 2018-05-29 DIAGNOSIS — I517 Cardiomegaly: Secondary | ICD-10-CM | POA: Diagnosis not present

## 2018-05-29 DIAGNOSIS — I110 Hypertensive heart disease with heart failure: Secondary | ICD-10-CM | POA: Diagnosis not present

## 2018-05-29 DIAGNOSIS — I8393 Asymptomatic varicose veins of bilateral lower extremities: Secondary | ICD-10-CM | POA: Diagnosis not present

## 2018-05-29 LAB — CBC WITH DIFFERENTIAL/PLATELET
Abs Immature Granulocytes: 0.03 10*3/uL (ref 0.00–0.07)
BASOS ABS: 0 10*3/uL (ref 0.0–0.1)
Basophils Relative: 0 %
Eosinophils Absolute: 0.1 10*3/uL (ref 0.0–0.5)
Eosinophils Relative: 1 %
HEMATOCRIT: 45.9 % (ref 36.0–46.0)
HEMOGLOBIN: 14.2 g/dL (ref 12.0–15.0)
Immature Granulocytes: 0 %
LYMPHS ABS: 0.8 10*3/uL (ref 0.7–4.0)
LYMPHS PCT: 8 %
MCH: 30.9 pg (ref 26.0–34.0)
MCHC: 30.9 g/dL (ref 30.0–36.0)
MCV: 99.8 fL (ref 80.0–100.0)
MONO ABS: 0.5 10*3/uL (ref 0.1–1.0)
MONOS PCT: 5 %
Neutro Abs: 8.5 10*3/uL — ABNORMAL HIGH (ref 1.7–7.7)
Neutrophils Relative %: 86 %
Platelets: 196 10*3/uL (ref 150–400)
RBC: 4.6 MIL/uL (ref 3.87–5.11)
RDW: 14.5 % (ref 11.5–15.5)
WBC: 10 10*3/uL (ref 4.0–10.5)
nRBC: 0 % (ref 0.0–0.2)

## 2018-05-29 LAB — BASIC METABOLIC PANEL
Anion gap: 8 (ref 5–15)
BUN: 27 mg/dL — AB (ref 8–23)
CO2: 20 mmol/L — ABNORMAL LOW (ref 22–32)
Calcium: 8.8 mg/dL — ABNORMAL LOW (ref 8.9–10.3)
Chloride: 110 mmol/L (ref 98–111)
Creatinine, Ser: 1.18 mg/dL — ABNORMAL HIGH (ref 0.44–1.00)
GFR calc Af Amer: 56 mL/min — ABNORMAL LOW (ref >60)
GFR calc non Af Amer: 48 mL/min — ABNORMAL LOW (ref >60)
Glucose, Bld: 134 mg/dL — ABNORMAL HIGH (ref 70–99)
POTASSIUM: 3.9 mmol/L (ref 3.5–5.1)
Sodium: 138 mmol/L (ref 135–145)

## 2018-05-29 LAB — URINALYSIS, ROUTINE W REFLEX MICROSCOPIC
BILIRUBIN URINE: NEGATIVE
Glucose, UA: NEGATIVE mg/dL
HGB URINE DIPSTICK: NEGATIVE
KETONES UR: NEGATIVE mg/dL
Nitrite: NEGATIVE
PROTEIN: NEGATIVE mg/dL
Specific Gravity, Urine: 1.01 (ref 1.005–1.030)
pH: 5 (ref 5.0–8.0)

## 2018-05-29 LAB — BLOOD CULTURE ID PANEL (REFLEXED)
Acinetobacter baumannii: NOT DETECTED
CANDIDA KRUSEI: NOT DETECTED
CANDIDA PARAPSILOSIS: NOT DETECTED
Candida albicans: NOT DETECTED
Candida glabrata: NOT DETECTED
Candida tropicalis: NOT DETECTED
ENTEROCOCCUS SPECIES: NOT DETECTED
ESCHERICHIA COLI: NOT DETECTED
Enterobacter cloacae complex: NOT DETECTED
Enterobacteriaceae species: NOT DETECTED
Haemophilus influenzae: NOT DETECTED
KLEBSIELLA OXYTOCA: NOT DETECTED
Klebsiella pneumoniae: NOT DETECTED
LISTERIA MONOCYTOGENES: NOT DETECTED
Neisseria meningitidis: NOT DETECTED
Proteus species: NOT DETECTED
Pseudomonas aeruginosa: NOT DETECTED
SERRATIA MARCESCENS: NOT DETECTED
STAPHYLOCOCCUS AUREUS BCID: NOT DETECTED
STREPTOCOCCUS PYOGENES: NOT DETECTED
Staphylococcus species: NOT DETECTED
Streptococcus agalactiae: DETECTED — AB
Streptococcus pneumoniae: NOT DETECTED
Streptococcus species: DETECTED — AB

## 2018-05-29 LAB — COMPREHENSIVE METABOLIC PANEL
ALT: 17 U/L (ref 0–44)
ANION GAP: 9 (ref 5–15)
AST: 34 U/L (ref 15–41)
Albumin: 4.1 g/dL (ref 3.5–5.0)
Alkaline Phosphatase: 109 U/L (ref 38–126)
BUN: 26 mg/dL — AB (ref 8–23)
CALCIUM: 9.8 mg/dL (ref 8.9–10.3)
CHLORIDE: 102 mmol/L (ref 98–111)
CO2: 25 mmol/L (ref 22–32)
CREATININE: 1.23 mg/dL — AB (ref 0.44–1.00)
GFR, EST AFRICAN AMERICAN: 53 mL/min — AB (ref >60)
GFR, EST NON AFRICAN AMERICAN: 46 mL/min — AB (ref >60)
Glucose, Bld: 99 mg/dL (ref 70–99)
Potassium: 4.1 mmol/L (ref 3.5–5.1)
SODIUM: 136 mmol/L (ref 135–145)
Total Bilirubin: 1.5 mg/dL — ABNORMAL HIGH (ref 0.3–1.2)
Total Protein: 8 g/dL (ref 6.5–8.1)

## 2018-05-29 LAB — I-STAT CG4 LACTIC ACID, ED
Lactic Acid, Venous: 2.23 mmol/L (ref 0.5–1.9)
Lactic Acid, Venous: 1.29 mmol/L (ref 0.5–1.9)

## 2018-05-29 LAB — CK: Total CK: 47 U/L (ref 38–234)

## 2018-05-29 LAB — SEDIMENTATION RATE: Sed Rate: 14 mm/hr (ref 0–22)

## 2018-05-29 LAB — CBG MONITORING, ED: GLUCOSE-CAPILLARY: 95 mg/dL (ref 70–99)

## 2018-05-29 LAB — PROTIME-INR
INR: 1.56
Prothrombin Time: 18.5 seconds — ABNORMAL HIGH (ref 11.4–15.2)

## 2018-05-29 LAB — INFLUENZA PANEL BY PCR (TYPE A & B)
INFLAPCR: NEGATIVE
INFLBPCR: NEGATIVE

## 2018-05-29 LAB — URIC ACID: Uric Acid, Serum: 5.9 mg/dL (ref 2.5–7.1)

## 2018-05-29 MED ORDER — VANCOMYCIN HCL 10 G IV SOLR
2000.0000 mg | Freq: Once | INTRAVENOUS | Status: AC
  Administered 2018-05-29: 2000 mg via INTRAVENOUS
  Filled 2018-05-29 (×2): qty 2000

## 2018-05-29 MED ORDER — TORSEMIDE 20 MG PO TABS: 60.0000 mg | ORAL_TABLET | Freq: Two times a day (BID) | ORAL | Status: DC

## 2018-05-29 MED ORDER — MIDODRINE HCL 5 MG PO TABS
2.5000 mg | ORAL_TABLET | Freq: Three times a day (TID) | ORAL | Status: DC
  Administered 2018-05-29 – 2018-06-03 (×15): 2.5 mg via ORAL
  Filled 2018-05-29 (×16): qty 1

## 2018-05-29 MED ORDER — PENICILLIN G POTASSIUM 20000000 UNITS IJ SOLR
4.0000 10*6.[IU] | INTRAVENOUS | Status: DC
  Administered 2018-05-29 – 2018-06-03 (×28): 4 10*6.[IU] via INTRAVENOUS
  Filled 2018-05-29 (×34): qty 4

## 2018-05-29 MED ORDER — METRONIDAZOLE IN NACL 5-0.79 MG/ML-% IV SOLN
500.0000 mg | Freq: Three times a day (TID) | INTRAVENOUS | Status: DC
  Administered 2018-05-29 (×2): 500 mg via INTRAVENOUS
  Filled 2018-05-29 (×2): qty 100

## 2018-05-29 MED ORDER — ONDANSETRON HCL 4 MG/2ML IJ SOLN
4.0000 mg | Freq: Once | INTRAMUSCULAR | Status: AC
  Administered 2018-05-29: 4 mg via INTRAVENOUS
  Filled 2018-05-29: qty 2

## 2018-05-29 MED ORDER — HYDROCODONE-ACETAMINOPHEN 5-325 MG PO TABS
1.0000 | ORAL_TABLET | ORAL | Status: DC | PRN
  Administered 2018-05-29 – 2018-05-31 (×5): 2 via ORAL
  Administered 2018-06-01: 1 via ORAL
  Filled 2018-05-29: qty 2
  Filled 2018-05-29: qty 1
  Filled 2018-05-29 (×3): qty 2
  Filled 2018-05-29: qty 1
  Filled 2018-05-29: qty 2

## 2018-05-29 MED ORDER — POLYETHYLENE GLYCOL 3350 17 G PO PACK
17.0000 g | PACK | Freq: Every day | ORAL | Status: DC | PRN
  Administered 2018-05-30 – 2018-06-02 (×4): 17 g via ORAL
  Filled 2018-05-29 (×4): qty 1

## 2018-05-29 MED ORDER — SODIUM CHLORIDE 0.9 % IV BOLUS (SEPSIS)
1000.0000 mL | Freq: Once | INTRAVENOUS | Status: AC
  Administered 2018-05-29: 1000 mL via INTRAVENOUS

## 2018-05-29 MED ORDER — ONDANSETRON HCL 4 MG PO TABS: 4.0000 mg | ORAL_TABLET | Freq: Four times a day (QID) | ORAL | Status: DC | PRN

## 2018-05-29 MED ORDER — FENTANYL CITRATE (PF) 100 MCG/2ML IJ SOLN
50.0000 ug | Freq: Once | INTRAMUSCULAR | Status: AC
  Administered 2018-05-29: 50 ug via INTRAVENOUS
  Filled 2018-05-29: qty 2

## 2018-05-29 MED ORDER — SODIUM CHLORIDE 0.9 % IV SOLN
2.0000 g | Freq: Once | INTRAVENOUS | Status: AC
  Administered 2018-05-29: 2 g via INTRAVENOUS
  Filled 2018-05-29: qty 2

## 2018-05-29 MED ORDER — FERROUS SULFATE 325 (65 FE) MG PO TABS
325.0000 mg | ORAL_TABLET | Freq: Two times a day (BID) | ORAL | Status: DC
  Administered 2018-05-29 – 2018-06-03 (×11): 325 mg via ORAL
  Filled 2018-05-29 (×11): qty 1

## 2018-05-29 MED ORDER — CYANOCOBALAMIN 1000 MCG/ML IJ SOLN: 1000.0000 ug | INTRAMUSCULAR | Status: DC

## 2018-05-29 MED ORDER — ALLOPURINOL 100 MG PO TABS
100.0000 mg | ORAL_TABLET | Freq: Every day | ORAL | Status: DC
  Administered 2018-05-29 – 2018-06-03 (×6): 100 mg via ORAL
  Filled 2018-05-29 (×6): qty 1

## 2018-05-29 MED ORDER — TORSEMIDE 20 MG PO TABS: 40.0000 mg | ORAL_TABLET | Freq: Two times a day (BID) | ORAL | Status: DC

## 2018-05-29 MED ORDER — VANCOMYCIN HCL IN DEXTROSE 1-5 GM/200ML-% IV SOLN: 1000.0000 mg | Freq: Once | INTRAVENOUS | Status: DC

## 2018-05-29 MED ORDER — LEVOTHYROXINE SODIUM 75 MCG PO TABS
175.0000 ug | ORAL_TABLET | Freq: Every day | ORAL | Status: DC
  Administered 2018-05-30 – 2018-06-03 (×5): 175 ug via ORAL
  Filled 2018-05-29 (×5): qty 1

## 2018-05-29 MED ORDER — ONDANSETRON HCL 4 MG/2ML IJ SOLN: 4.0000 mg | Freq: Four times a day (QID) | INTRAMUSCULAR | Status: DC | PRN

## 2018-05-29 MED ORDER — POTASSIUM CHLORIDE CRYS ER 20 MEQ PO TBCR
40.0000 meq | EXTENDED_RELEASE_TABLET | Freq: Every day | ORAL | Status: DC
  Administered 2018-05-29 – 2018-05-30 (×2): 40 meq via ORAL
  Filled 2018-05-29 (×2): qty 2

## 2018-05-29 MED ORDER — MAGNESIUM OXIDE 400 (241.3 MG) MG PO TABS
400.0000 mg | ORAL_TABLET | Freq: Every day | ORAL | Status: DC
  Administered 2018-05-29 – 2018-06-03 (×6): 400 mg via ORAL
  Filled 2018-05-29 (×6): qty 1

## 2018-05-29 MED ORDER — ACETAMINOPHEN 500 MG PO TABS
1000.0000 mg | ORAL_TABLET | Freq: Once | ORAL | Status: DC
  Filled 2018-05-29: qty 2

## 2018-05-29 MED ORDER — TORSEMIDE 20 MG PO TABS
40.0000 mg | ORAL_TABLET | Freq: Two times a day (BID) | ORAL | Status: DC
  Administered 2018-05-29: 40 mg via ORAL
  Filled 2018-05-29: qty 2

## 2018-05-29 MED ORDER — APIXABAN 2.5 MG PO TABS
2.5000 mg | ORAL_TABLET | Freq: Two times a day (BID) | ORAL | Status: DC
  Administered 2018-05-29 – 2018-06-03 (×11): 2.5 mg via ORAL
  Filled 2018-05-29 (×11): qty 1

## 2018-05-29 MED ORDER — ASPIRIN EC 81 MG PO TBEC
81.0000 mg | DELAYED_RELEASE_TABLET | Freq: Every day | ORAL | Status: DC
  Administered 2018-05-29 – 2018-06-03 (×6): 81 mg via ORAL
  Filled 2018-05-29 (×6): qty 1

## 2018-05-29 MED ORDER — METHYLPREDNISOLONE SODIUM SUCC 40 MG IJ SOLR
40.0000 mg | Freq: Two times a day (BID) | INTRAMUSCULAR | Status: DC
  Administered 2018-05-29 (×2): 40 mg via INTRAVENOUS
  Filled 2018-05-29 (×2): qty 1

## 2018-05-29 MED ORDER — FLUTICASONE PROPIONATE 50 MCG/ACT NA SUSP
1.0000 | Freq: Every day | NASAL | Status: DC
  Administered 2018-05-29 – 2018-06-03 (×6): 1 via NASAL
  Filled 2018-05-29: qty 16

## 2018-05-29 MED ORDER — SODIUM CHLORIDE 0.9 % IV BOLUS (SEPSIS)
250.0000 mL | Freq: Once | INTRAVENOUS | Status: AC
  Administered 2018-05-29: 250 mL via INTRAVENOUS

## 2018-05-29 MED ORDER — PANTOPRAZOLE SODIUM 40 MG PO TBEC
40.0000 mg | DELAYED_RELEASE_TABLET | Freq: Every day | ORAL | Status: DC
  Administered 2018-05-29 – 2018-06-03 (×6): 40 mg via ORAL
  Filled 2018-05-29 (×6): qty 1

## 2018-05-29 MED ORDER — SODIUM CHLORIDE 0.9 % IV SOLN
1.0000 g | INTRAVENOUS | Status: DC
  Administered 2018-05-29: 1 g via INTRAVENOUS
  Filled 2018-05-29: qty 10

## 2018-05-29 NOTE — ED Notes (Signed)
Patient transported to X-ray 

## 2018-05-29 NOTE — Progress Notes (Signed)
.  PHARMACY - PHYSICIAN COMMUNICATION CRITICAL VALUE ALERT - BLOOD CULTURE IDENTIFICATION (BCID)  Christine Hicks is an 62 y.o. female who presented to [facility] on 05/29/2018 with a chief complaint of fever, chills, and diffuse myalgias. 3/4 BCx now with GPC in chains, BCID Strep agalactiae.  Name of physician (or Provider) Contacted: Kamineni  Current antibiotics: Vancomycin, ceftriaxone, and Flagyl  Changes to prescribed antibiotics recommended:  D/c all current abx. Start IV PCN G. Recommendations accepted by provider.  Results for orders placed or performed during the hospital encounter of 05/29/18  Blood Culture ID Panel (Reflexed) (Collected: 05/29/2018  1:50 AM)  Result Value Ref Range   Enterococcus species NOT DETECTED NOT DETECTED   Listeria monocytogenes NOT DETECTED NOT DETECTED   Staphylococcus species NOT DETECTED NOT DETECTED   Staphylococcus aureus (BCID) NOT DETECTED NOT DETECTED   Streptococcus species DETECTED (A) NOT DETECTED   Streptococcus agalactiae DETECTED (A) NOT DETECTED   Streptococcus pneumoniae NOT DETECTED NOT DETECTED   Streptococcus pyogenes NOT DETECTED NOT DETECTED   Acinetobacter baumannii NOT DETECTED NOT DETECTED   Enterobacteriaceae species NOT DETECTED NOT DETECTED   Enterobacter cloacae complex NOT DETECTED NOT DETECTED   Escherichia coli NOT DETECTED NOT DETECTED   Klebsiella oxytoca NOT DETECTED NOT DETECTED   Klebsiella pneumoniae NOT DETECTED NOT DETECTED   Proteus species NOT DETECTED NOT DETECTED   Serratia marcescens NOT DETECTED NOT DETECTED   Haemophilus influenzae NOT DETECTED NOT DETECTED   Neisseria meningitidis NOT DETECTED NOT DETECTED   Pseudomonas aeruginosa NOT DETECTED NOT DETECTED   Candida albicans NOT DETECTED NOT DETECTED   Candida glabrata NOT DETECTED NOT DETECTED   Candida krusei NOT DETECTED NOT DETECTED   Candida parapsilosis NOT DETECTED NOT DETECTED   Candida tropicalis NOT DETECTED NOT DETECTED   Christine Hicks, PharmD PGY2 Infectious Diseases Pharmacy Resident Phone: [phone] 05/29/2018  4:41 PM

## 2018-05-29 NOTE — Progress Notes (Signed)
[name] [id number]  Code Status: FULL Admission Data: 05/29/2018 5:16 PM  Attending Provider: Lajuana Ripple NWG:[id number], Loistine Chance, MD  Consults/ Treatment Team:   Christine Hicks is a 62 y.o. female patient admitted from ED awake, alert - oriented X 4 - no acute distress noted. VSS - Blood pressure 109/65, pulse 67, temperature 99.1 F (37.3 C), temperature source Oral, resp. rate 18, height 5\' 5"  (1.651 m), weight 103.9 kg, SpO2 94 %. no c/o shortness of breath, no c/o chest pain. Orientation to room, and floor completed with information packet given to patient/family. Admission INP armband ID verified with patient/family, and in place.  SR up x 2, fall assessment complete, with patient and family able to verbalize understanding of risk associated with falls, and verbalized understanding to call nsg before up out of bed. Call light within reach, patient able to voice, and demonstrate understanding. Skin, clean-dry- intact without evidence of bruising, or skin tears.  No evidence of skin break down noted on exam.  ?  Will cont to eval and treat per MD orders.  Jon Gills, RN  05/29/2018 5:16 PM

## 2018-05-29 NOTE — ED Notes (Signed)
Admitting at bedside 

## 2018-05-29 NOTE — ED Provider Notes (Signed)
TIME SEEN: 2:02 AM  CHIEF COMPLAINT: Left leg pain  HPI: Patient is a 62 year old female with history of aortic valve replacement on Eliquis, CHF, hypertension who presents to the emergency department with complaints of left leg pain, not feeling well today.  No known fever but is very warm to touch on examination.  Denies any headache, neck pain or neck stiffness, chest pain or shortness of breath, cough, vomiting or diarrhea, rash.  No injury to her leg.  Reports she has had previous blood clots but is compliant with Eliquis.  No sick contacts.  Has not had a flu shot this year.  ROS: See HPI Constitutional: no fever  Eyes: no drainage  ENT: no runny nose   Cardiovascular:  no chest pain  Resp: no SOB  GI: no vomiting GU: no dysuria Integumentary: no rash  Allergy: no hives  Musculoskeletal: no leg swelling  Neurological: no slurred speech ROS otherwise negative  PAST MEDICAL HISTORY/PAST SURGICAL HISTORY:  Past Medical History:  Diagnosis Date  . Acute on chronic diastolic congestive heart failure (HCC)   . Anemia, iron deficiency    negative egd/colonoscopy 11/16/2015  . Aortic stenosis, severe   . Arthritis   . Bicuspid aortic valve   . DVT (deep venous thrombosis) (HCC)    RLE DVT 11/12/15  . GERD (gastroesophageal reflux disease)   . Heart murmur   . Hypertension   . Insomnia   . Morbid obesity with BMI of 45.0-49.9, adult (HCC)   . PONV (postoperative nausea and vomiting)    took a long time to wake up  . 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 Thyroid disease   . Wears glasses   . Wears partial dentures     MEDICATIONS:  Prior to Admission medications   Medication Sig Start Date End Date Taking? Authorizing Provider  acetaminophen (TYLENOL) 500 MG tablet  Take 1,000 mg by mouth every 6 (six) hours as needed (arthritis).    [provider]  allopurinol (ZYLOPRIM) 100 MG tablet TAKE 1 TABLET BY MOUTH EVERY DAY 05/12/18   Lyn Records, MD  apixaban (ELIQUIS) 2.5 MG TABS tablet Take 1 tablet (2.5 mg total) by mouth 2 (two) times daily. 05/11/18   Clegg, [name] D, NP  aspirin EC 81 MG tablet Take 81 mg by mouth daily.    [provider]  cyanocobalamin (,VITAMIN B-12,) 1000 MCG/ML injection Inject 1 mL (1,000 mcg total) into the muscle every 30 (thirty) days. 01/14/18   Shon Hale, MD  ferrous sulfate 325 (65 FE) MG tablet Take 1 tablet (325 mg total) by mouth 2 (two) times daily with a meal. 01/14/18   Emokpae, Courage, MD  fluticasone (FLONASE) 50 MCG/ACT nasal spray PLACE 1 SPRAY INTO BOTH NOSTRILS DAILY. 05/21/18   Clegg, [name] D, NP  levothyroxine (SYNTHROID, LEVOTHROID) 175 MCG tablet Take 1 tablet (175 mcg total) by mouth daily. 01/14/18   Shon Hale, MD  Magnesium 400 MG CAPS Take 400 mg by mouth daily. 01/14/18   Shon Hale, MD  metolazone (ZAROXOLYN) 2.5 MG tablet Take 1 tablet (2.5 mg total) by mouth daily as needed. (For weight gain more than 255 pounds - fluid/weight gain) 05/11/18   Clegg, [name] D, NP  midodrine (PROAMATINE) 2.5 MG tablet Take 1 tablet (2.5 mg total) by  mouth 3 (three) times daily with meals. 05/11/18   Clegg, [name] D, NP  pantoprazole (PROTONIX) 40 MG tablet Take 1 tablet (40 mg total) by mouth daily. 01/14/18   Shon Hale, MD  potassium chloride SA (K-DUR,KLOR-CON) 20 MEQ tablet Take 2 tablets (40 mEq total) by mouth daily. 05/11/18   Clegg, [name] D, NP  spironolactone (ALDACTONE) 25 MG tablet Take 1 tablet (25 mg total) by mouth daily. 05/11/18   Clegg, [name] D, NP  torsemide (DEMADEX) 20 MG tablet Take 3 tablets (60 mg total) by mouth 2 (two) times daily. 05/11/18   Clegg, [name] D, NP    ALLERGIES:  Allergies  Allergen Reactions  . No Known Allergies     SOCIAL HISTORY:  Social History    Tobacco Use  . Smoking status: Former Smoker    Types: Cigarettes  . Smokeless tobacco: Never Used  . Tobacco comment: quit smoking cigarettes > 25 years ago  Substance Use Topics  . Alcohol use: No    FAMILY HISTORY: Family History  Problem Relation Age of Onset  . Cardiomyopathy Son   . Heart attack Mother   . Emphysema Father   . Lung cancer Maternal Aunt     EXAM: BP 115/72 (BP Location: Left Arm)   Pulse (!) 103   Temp (!) 102.5 F (39.2 C) (Rectal)   Resp (!) 32   LMP  (LMP Unknown)   SpO2 95%  CONSTITUTIONAL: Alert and oriented and responds appropriately to questions.  Patient found to be febrile here but in no significant distress or toxic appearing HEAD: Normocephalic EYES: Conjunctivae clear, pupils appear equal, EOMI ENT: normal nose; moist mucous membranes NECK: Supple, no meningismus, no nuchal rigidity, no LAD  CARD: Regular and tachycardic; S1 and S2 appreciated; no murmurs, no clicks, no rubs, no gallops RESP: Normal chest excursion without splinting, patient is tachypneic; breath sounds clear and equal bilaterally; no wheezes, no rhonchi, no rales, no hypoxia or respiratory distress, speaking full sentences ABD/GI: Normal bowel sounds; non-distended; soft, non-tender, no rebound, no guarding, no peritoneal signs, no hepatosplenomegaly BACK:  The back appears normal and is non-tender to palpation, there is no CVA tenderness EXT: Tender to palpation over the anterior left thigh.  There are multiple varicose veins in both legs that she reports is chronic.  No redness, induration, fluctuance noted.  She has 2+ DP pulses bilaterally.  Compartments are soft.  No joint effusions noted.  Normal ROM in all joints; otherwise extremities are non-tender to palpation; no edema; normal capillary refill; no cyanosis, no calf tenderness or swelling    SKIN: Normal color for age and race; warm; no rash NEURO: Moves all extremities equally PSYCH: The patient's mood and manner  are appropriate. Grooming and personal hygiene are appropriate.  MEDICAL DECISION MAKING: Patient here with fever, tachycardia, tachypnea.  She meets sirs criteria.  Will start sepsis work-up and give broad-spectrum antibiotics.  Will hold IV fluids given history of CHF.  No signs of cellulitis on exam.  Will obtain chest x-ray, urine, cultures, flu swab.  ED PROGRESS: Patient's labs are reassuring.  Lactate mildly elevated but this is improved after IV hydration.  Urine, flu swab, chest x-ray pending.   Patient's blood pressures have dropped.  I have no order 30 mL/kg IV fluid bolus.  Will monitor closely.  Chest x-ray is clear.  Urine shows no sign of infection.    5:32 AM Discussed patient's case with hospitalist, Dr. Julian Reil.  I have recommended admission and  patient (and family if present) agree with this plan. Admitting physician will place admission orders.   I reviewed all nursing notes, vitals, pertinent previous records, EKGs, lab and urine results, imaging (as available).   6:45 AM  Blood pressure improved after IV hydration.  She is not having any shortness of breath or new oxygen requirement.  Flu swab negative.  Lactate has improved.  Awaiting admission to stepdown bed.    EKG Interpretation  Date/Time:  Saturday May 29 2018 02:24:46 EDT Ventricular Rate:  97 PR Interval:    QRS Duration: 128 QT Interval:  407 QTC Calculation: 517 R Axis:   95 Text Interpretation:  Sinus rhythm Ventricular premature complex Right atrial enlargement Right bundle branch block No significant change since last tracing Confirmed by Maryalyce Sanjuan, Baxter Hire [phone]) on 05/29/2018 2:32:06 AM       CRITICAL CARE Performed by: Baxter Hire Krimson Massmann   Total critical care time: 45 minutes  Critical care time was exclusive of separately billable procedures and treating other patients.  Critical care was necessary to treat or prevent imminent or life-threatening deterioration.  Critical care was time spent  personally by me on the following activities: development of treatment plan with patient and/or surrogate as well as nursing, discussions with consultants, evaluation of patient's response to treatment, examination of patient, obtaining history from patient or surrogate, ordering and performing treatments and interventions, ordering and review of laboratory studies, ordering and review of radiographic studies, pulse oximetry and re-evaluation of patient's condition.    Arwilda Georgia, Layla Maw, DO 05/29/18 [phone]

## 2018-05-29 NOTE — ED Triage Notes (Signed)
BIB [facility] from home with left shoulder and left leg pain that began 2 days ago and has progressively gotten worse. Pt ambulatory to room from restroom on arrival. Also complains of fever X2 days and excessive thirsrt. CBG 119 per EMS.

## 2018-05-29 NOTE — H&P (Signed)
History and Physical    DOA: 05/29/2018  PCP: Clovia Cuff, MD  Patient coming from: Home  Chief Complaint: Chills and myalgias  HPI: Christine Hicks is a 62 y.o. 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  Patient also particularly points to left thigh and states it has been hurting badly since yesterday and she was unable to bear weight last night when she tried to use the bathroom.  She denies any particular joint swellings although she does have chronic arthritis and joint pains.  Patient takes allopurinol daily for gout prevention.  She is on chronic diuretics for chronic venous stasis edema and diastolic CHF and on Midodrin for orthostasis.  She denies any cough or sick contacts.  She did not get her flu shot this year.  She denies any abdominal pain or diarrhea although reports feeling nauseous in the ED after antibiotics.  She states she felt somewhat dyspneic on laying down last night but feels okay now.  She denies any dysuria or hematuria.  She was febrile in the ED with T-max of 103.  She reports compliance with her medications including anticoagulation Work-up in the ED revealed normal white count, elevated lactate at 2.23 on presentation subsequently trended down to 1.29, chest x-ray showing mild vascular congestion and cardiomegaly but no infiltrates, negative UA and negative flu swab.  Review of Systems: As per HPI otherwise 10 point review of systems negative.    Past Medical History:  Diagnosis Date  . Acute on chronic diastolic congestive heart failure ([place])   . Anemia, iron deficiency    negative  egd/colonoscopy 11/16/2015  . Aortic stenosis, severe   . Arthritis   . Bicuspid aortic valve   . DVT (deep venous thrombosis) ([place])    RLE DVT 11/12/15  . GERD (gastroesophageal reflux disease)   . Heart murmur   . Hypertension   . Insomnia   . Morbid obesity with BMI of 45.0-49.9, adult ([place])   . PONV (postoperative nausea and vomiting)    took a long time to wake up  . 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 Thyroid disease   . Wears glasses   . Wears partial dentures     Past Surgical History:  Procedure Laterality Date  . 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 AORTIC VALVE REPLACEMENT N/A 01/01/2017   Procedure: MINIMALLY INVASIVE AORTIC VALVE REPLACEMENT (AVR);  Surgeon: Rexene Alberts, MD;  Location: [place];  Service: Open Heart Surgery;  Laterality: N/A;  . ASCENDING AORTIC ROOT REPLACEMENT N/A 01/01/2017   Procedure: ASCENDING AORTIC ROOT REPLACEMENT;  Surgeon: Rexene Alberts, MD;  Location: [place];  Service: Open Heart Surgery;  Laterality: N/A;  . CARDIAC CATHETERIZATION     12/16/16  . COLONOSCOPY N/A 11/16/2015   Procedure: COLONOSCOPY;  Surgeon: Gatha Mayer, MD;  Location: [place];  Service: Endoscopy;  Laterality: N/A;  . ESOPHAGOGASTRODUODENOSCOPY N/A 11/16/2015   Procedure: ESOPHAGOGASTRODUODENOSCOPY (EGD);  Surgeon: Gatha Mayer, MD;  Location: [facility]  ENDOSCOPY;  Service: Endoscopy;  Laterality: N/A;  . HERNIA REPAIR    . MULTIPLE TOOTH EXTRACTIONS    . RIGHT HEART CATH N/A 01/11/2018   Procedure: RIGHT HEART CATH;  Surgeon: Jolaine Artist, MD;  Location: [place] [facility];  Service: Cardiovascular;  Laterality: N/A;  . Right leg cellulitis surgery    . TEE WITHOUT CARDIOVERSION N/A 01/01/2017   Procedure: TRANSESOPHAGEAL  ECHOCARDIOGRAM (TEE);  Surgeon: Rexene Alberts, MD;  Location: [place];  Service: Open Heart Surgery;  Laterality: N/A;    Social history:  reports that she has quit smoking. Her smoking use included cigarettes. She has never used smokeless tobacco. She reports that she does not drink alcohol or use drugs.   Allergies  Allergen Reactions  . No Known Allergies     Family History  Problem Relation Age of Onset  . Cardiomyopathy Son   . Heart attack Mother   . Emphysema Father   . Lung cancer Maternal Aunt       Prior to Admission medications   Medication Sig Start Date End Date Taking? Authorizing Provider  allopurinol (ZYLOPRIM) 100 MG tablet TAKE 1 TABLET BY MOUTH EVERY DAY Patient taking differently: Take 100 mg by mouth daily.  05/12/18  Yes Belva Crome, MD  apixaban (ELIQUIS) 2.5 MG TABS tablet Take 1 tablet (2.5 mg total) by mouth 2 (two) times daily. 05/11/18  Yes Clegg, [name] D, NP  aspirin EC 81 MG tablet Take 81 mg by mouth daily.   Yes [provider]  cyanocobalamin (,VITAMIN B-12,) 1000 MCG/ML injection Inject 1 mL (1,000 mcg total) into the muscle every 30 (thirty) days. 01/14/18  Yes Roxan Hockey, MD  ferrous sulfate 325 (65 FE) MG tablet Take 1 tablet (325 mg total) by mouth 2 (two) times daily with a meal. 01/14/18  Yes Emokpae, Courage, MD  fluticasone (FLONASE) 50 MCG/ACT nasal spray PLACE 1 SPRAY INTO BOTH NOSTRILS DAILY. 05/21/18  Yes Clegg, [name] D, NP  levothyroxine (SYNTHROID, LEVOTHROID) 175 MCG tablet Take 1 tablet (175 mcg total) by mouth daily. 01/14/18  Yes Emokpae, Courage, MD  Magnesium 400 MG CAPS Take 400 mg by mouth daily. 01/14/18  Yes Emokpae, Courage, MD  metolazone (ZAROXOLYN) 2.5 MG tablet Take 1 tablet (2.5 mg total) by mouth daily as needed. (For weight gain more than 255 pounds - fluid/weight gain) 05/11/18  Yes Clegg, [name] D, NP  midodrine (PROAMATINE) 2.5 MG tablet Take 1 tablet (2.5 mg total) by mouth 3 (three) times daily with  meals. 05/11/18  Yes Clegg, [name] D, NP  pantoprazole (PROTONIX) 40 MG tablet Take 1 tablet (40 mg total) by mouth daily. 01/14/18  Yes Emokpae, Courage, MD  potassium chloride SA (K-DUR,KLOR-CON) 20 MEQ tablet Take 2 tablets (40 mEq total) by mouth daily. 05/11/18  Yes Clegg, [name] D, NP  spironolactone (ALDACTONE) 25 MG tablet Take 1 tablet (25 mg total) by mouth daily. 05/11/18  Yes Clegg, [name] D, NP  torsemide (DEMADEX) 20 MG tablet Take 3 tablets (60 mg total) by mouth 2 (two) times daily. 05/11/18  Yes Clegg, [name] D, NP    Physical Exam: Vitals:   05/29/18 0645 05/29/18 0700 05/29/18 0730 05/29/18 0745  BP: (!) 102/54 (!) 103/54 (!) 105/55 (!) 104/59  Pulse: 92 92 91 86  Resp: _0 Temp:      TempSrc:      SpO2:   94% 95%  Weight:      Height:  Vitals:   05/29/18 0645 05/29/18 0700 05/29/18 0730 05/29/18 0745  BP: (!) 102/54 (!) 103/54 (!) 105/55 (!) 104/59  Pulse: 92 92 91 86  Resp: _0 Temp:      TempSrc:      SpO2:   94% 95%  Weight:      Height:       Constitutional: NAD, calm, comfortable Eyes: PERRL, lids and conjunctivae normal ENMT: Mucous membranes are moist. Posterior pharynx clear of any exudate or lesions.Normal dentition.  Neck: normal, supple, no masses, no thyromegaly Respiratory: Obese chest wall, distant breath sounds, clear to auscultation bilaterally, no wheezing, no crackles. Normal respiratory effort. No accessory muscle use.  Cardiovascular: Irregular rate and rhythm, no murmurs / rubs / gallops.  Chronic leg edema with no worsening, 2+ pedal pulses. No carotid bruits.  Abdomen: Obese, no tenderness, no masses palpated. No hepatosplenomegaly. Bowel sounds positive.  Musculoskeletal: no clubbing / cyanosis.  Muscle tenderness in bilateral thighs/lower leg, palpable cordlike varicose veins with tenderness in the right lower extremity.  No significant joint swelling or decreased range of motion noted Neurologic: CN 2-12 grossly intact.  Sensation intact, DTR normal. Strength 5/5 in all 4.  Psychiatric: Normal judgment and insight. Alert and oriented x 3. Normal mood.  SKIN/catheters:Chronic venous stasis with varicose veins and stasis dermatitis.   Labs on Admission: I have personally reviewed following labs and imaging studies  CBC: Recent Labs  Lab 05/29/18 0204  WBC 10.0  NEUTROABS 8.5*  HGB 14.2  HCT 45.9  MCV 99.8  PLT 007   Basic Metabolic Panel: Recent Labs  Lab 05/29/18 0204  NA 136  K 4.1  CL 102  CO2 25  GLUCOSE 99  BUN 26*  CREATININE 1.23*  CALCIUM 9.8   GFR: Estimated Creatinine Clearance: 56.5 mL/min (A) (by C-G formula based on SCr of 1.23 mg/dL (H)). Liver Function Tests: Recent Labs  Lab 05/29/18 0204  AST 34  ALT 17  ALKPHOS 109  BILITOT 1.5*  PROT 8.0  ALBUMIN 4.1   No results for input(s): LIPASE, AMYLASE in the last 168 hours. No results for input(s): AMMONIA in the last 168 hours. Coagulation Profile: Recent Labs  Lab 05/29/18 0204  INR 1.56   Cardiac Enzymes: No results for input(s): CKTOTAL, CKMB, CKMBINDEX, TROPONINI in the last 168 hours. BNP (last 3 results) No results for input(s): PROBNP in the last 8760 hours. HbA1C: No results for input(s): HGBA1C in the last 72 hours. CBG: Recent Labs  Lab 05/29/18 0238  GLUCAP 95   Lipid Profile: No results for input(s): CHOL, HDL, LDLCALC, TRIG, CHOLHDL, LDLDIRECT in the last 72 hours. Thyroid Function Tests: No results for input(s): TSH, T4TOTAL, FREET4, T3FREE, THYROIDAB in the last 72 hours. Anemia Panel: No results for input(s): VITAMINB12, FOLATE, FERRITIN, TIBC, IRON, RETICCTPCT in the last 72 hours. Urine analysis:    Component Value Date/Time   COLORURINE YELLOW 05/29/2018 [place] 05/29/2018 0455   LABSPEC 1.010 05/29/2018 0455   PHURINE 5.0 05/29/2018 [place] 05/29/2018 0455   HGBUR NEGATIVE 05/29/2018 0455   BILIRUBINUR NEGATIVE 05/29/2018 0455   [place] 05/29/2018 0455   PROTEINUR NEGATIVE 05/29/2018 0455   UROBILINOGEN 0.2 06/22/2010 2055   NITRITE NEGATIVE 05/29/2018 0455   LEUKOCYTESUR SMALL (A) 05/29/2018 0455    Radiological Exams on Admission: Dg Chest 2 View  Result Date: 05/29/2018 CLINICAL DATA:  62 year old female with sepsis. EXAM: CHEST - 2 VIEW COMPARISON:  Chest radiograph dated 12/31/2017 FINDINGS: There is stable cardiomegaly. There is minimal vascular congestion. No edema. No focal consolidation or pneumothorax. No pleural effusion. Median sternotomy wires. No acute osseous pathology. IMPRESSION: Cardiomegaly with minimal vascular congestion. No edema or focal consolidation. Electronically Signed   By: Arash  Radparvar M.D.   On: 05/29/2018 05:25    EKG: Independently reviewed.  Sinus rhythm with PVCs and right bundle branch block     Assessment and Plan:   1.  Fever/chills: Unclear source.  Differentials include phlebitis and varicose veins versus DVT versus gouty flare versus viral illness.  Flu test is negative.  Patient on chronic anticoagulation making DVT unlikely although she is at high risk given severe varicosities in lower extremities.  Will obtain Doppler lower extremities to rule out DVT/anticoagulation failure.  Will treat with steroids for varicose vein inflammation/gouty flare.  Check uric acid level.  Continue allopurinol.  Chest x-ray and UA negative.  No abdominal symptoms to suggest intra-abdominal infection.  Will give empiric antibiotics for phlebitis although do not feel she has sepsis syndrome as appears nontoxic clinically and borderline elevated lactate on presentation now normalized, no evidence of white count.  2.  Hypotension: Likely chronic as patient noted to be on Midodrin at home.  She likely has orthostasis due to varicose veins.  Support stockings when able to tolerate  3.  Chronic diastolic CHF/stasis edema: Patient on spironolactone and Demadex 60 mg twice daily at home.  Patient  somewhat hypotensive and noted to have mildly elevated creatinine, will hold spironolactone and resume Demadex. Reluctant to hold Demadex as patient does complain of some dyspnea and chest x-ray shows mild pulmonary edema.  Will avoid IV hydration.  4.  Thigh pain: Patient has muscle tenderness in both thighs.  Will check CK level, C-reactive protein and ESR.  Obtain venous Doppler lower extremity to rule out DVT.  Resume anticoagulation.  5.  Severe aortic stenosis: Status post porcine aortic valve replacement.   6.  AKI: Patient received IV fluids in the ED: Hold spinal lactone and resume Demadex at lower dose.  Repeat BMP and adjust diuretic dose as needed.  Reluctant to aggressively hydrate given complaints of dyspnea and mild edema on chest x-ray  DVT prophylaxis: On anticoagulation  Code Status: Full code  Family Communication: Discussed with patient and family bedside, all questions answered. Health care proxy would be husband and son Consults called: None Admission status:  Patient admitted as inpatient as anticipated LOS greater than 2 midnights    Neelima Kamineni MD [facility] Pager [phone]  If 7PM-7AM, please contact night-coverage [email or web address] Password [email or web address]  05/29/2018, 8:25 AM   

## 2018-05-30 ENCOUNTER — Inpatient Hospital Stay (HOSPITAL_COMMUNITY): Payer: Medicare Other

## 2018-05-30 DIAGNOSIS — R011 Cardiac murmur, unspecified: Secondary | ICD-10-CM

## 2018-05-30 DIAGNOSIS — Z87891 Personal history of nicotine dependence: Secondary | ICD-10-CM

## 2018-05-30 DIAGNOSIS — L03116 Cellulitis of left lower limb: Secondary | ICD-10-CM | POA: Diagnosis present

## 2018-05-30 DIAGNOSIS — R7881 Bacteremia: Secondary | ICD-10-CM

## 2018-05-30 DIAGNOSIS — M79605 Pain in left leg: Secondary | ICD-10-CM

## 2018-05-30 DIAGNOSIS — R509 Fever, unspecified: Secondary | ICD-10-CM

## 2018-05-30 DIAGNOSIS — Z8774 Personal history of (corrected) congenital malformations of heart and circulatory system: Secondary | ICD-10-CM

## 2018-05-30 DIAGNOSIS — I340 Nonrheumatic mitral (valve) insufficiency: Secondary | ICD-10-CM

## 2018-05-30 DIAGNOSIS — Z952 Presence of prosthetic heart valve: Secondary | ICD-10-CM

## 2018-05-30 DIAGNOSIS — B955 Unspecified streptococcus as the cause of diseases classified elsewhere: Secondary | ICD-10-CM | POA: Diagnosis present

## 2018-05-30 DIAGNOSIS — B951 Streptococcus, group B, as the cause of diseases classified elsewhere: Secondary | ICD-10-CM

## 2018-05-30 DIAGNOSIS — I8392 Asymptomatic varicose veins of left lower extremity: Secondary | ICD-10-CM

## 2018-05-30 LAB — BASIC METABOLIC PANEL
Anion gap: 6 (ref 5–15)
BUN: 33 mg/dL — AB (ref 8–23)
CHLORIDE: 106 mmol/L (ref 98–111)
CO2: 22 mmol/L (ref 22–32)
Calcium: 9.3 mg/dL (ref 8.9–10.3)
Creatinine, Ser: 1.46 mg/dL — ABNORMAL HIGH (ref 0.44–1.00)
GFR calc Af Amer: 43 mL/min — ABNORMAL LOW (ref >60)
GFR calc non Af Amer: 37 mL/min — ABNORMAL LOW (ref >60)
Glucose, Bld: 145 mg/dL — ABNORMAL HIGH (ref 70–99)
POTASSIUM: 4.4 mmol/L (ref 3.5–5.1)
SODIUM: 134 mmol/L — AB (ref 135–145)

## 2018-05-30 LAB — ECHOCARDIOGRAM COMPLETE
Height: 65 in
WEIGHTICAEL: 3664.93 [oz_av]

## 2018-05-30 NOTE — Progress Notes (Signed)
  Echocardiogram 2D Echocardiogram has been performed.  Christine Hicks M 05/30/2018, 1:51 PM

## 2018-05-30 NOTE — Consult Note (Signed)
[facility]    Date of Admission:  05/29/2018   Total days of antibiotics 2        Day 1 penicillin               Reason for Consult: Group B streptococcal bacteremia    Referring Provider: Zannie Cove  Assessment: I suspect that she had very early left leg cellulitis complicated by group B streptococcal bacteremia.  She is responded promptly to antibiotic therapy.  I agree with repeat blood cultures and TTE.  Plan: 1. Continue penicillin 2. Await results of repeat blood cultures and TTE  Principal Problem:   Streptococcal bacteremia Active Problems:   Sepsis (HCC)   Left leg cellulitis   S/P partial sternotomy for aortic root replacement with stentless porcine aortic root graft    Scheduled Meds: . allopurinol  100 mg Oral Daily  . apixaban  2.5 mg Oral BID  . aspirin EC  81 mg Oral Daily  . [START ON 06/16/2018] cyanocobalamin  1,000 mcg Intramuscular Q30 days  . ferrous sulfate  325 mg Oral BID WC  . fluticasone  1 spray Each Nare Daily  . levothyroxine  175 mcg Oral QAC breakfast  . magnesium oxide  400 mg Oral Daily  . midodrine  2.5 mg Oral TID WC  . pantoprazole  40 mg Oral Daily   Continuous Infusions: . pencillin G potassium IV 4 Million Units (05/30/18 1541)   PRN Meds:.HYDROcodone-acetaminophen, ondansetron **OR** ondansetron (ZOFRAN) IV, polyethylene glycol  HPI: Christine Hicks is a 62 y.o. female who was admitted 2 days ago after developing fever and shaking chills.  She has noted some pain in her left leg and her son noted that that leg was more warm than usual.  She was started on broad empiric antibiotic therapy and defervesced promptly.  She is feeling better and her left leg pain has resolved.  Both admission blood cultures have grown group B strep.  She has a history of congenital bicuspid aortic valve and developed aortic stenosis.  She underwent aortic valve replacement in June of last year.   Review of  Systems: Review of Systems  Constitutional: Positive for chills, fever, malaise/fatigue and weight loss. Negative for diaphoresis.  Respiratory: Negative for cough.   Cardiovascular: Negative for chest pain.  Gastrointestinal: Negative for abdominal pain, diarrhea, nausea and vomiting.  Musculoskeletal:       Left leg pain and warmth noted in HPI.  Skin: Negative for rash.    Past Medical History:  Diagnosis Date  . Acute on chronic diastolic congestive heart failure (HCC)   . Anemia, iron deficiency    negative egd/colonoscopy 11/16/2015  . Aortic stenosis, severe   . Arthritis   . Bicuspid aortic valve   . DVT (deep venous thrombosis) (HCC)    RLE DVT 11/12/15  . GERD (gastroesophageal reflux disease)   . Heart murmur   . Hypertension   . Insomnia   . Morbid obesity with BMI of 45.0-49.9, adult (HCC)   . PONV (postoperative nausea and vomiting)    took a long time to wake up  . 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 Thyroid disease   . Wears glasses   . Wears partial dentures  Social History   Tobacco Use  . Smoking status: Former Smoker    Types: Cigarettes  . Smokeless tobacco: Never Used  . Tobacco comment: quit smoking cigarettes > 25 years ago  Substance Use Topics  . Alcohol use: No  . Drug use: No    Family History  Problem Relation Age of Onset  . Cardiomyopathy Son   . Heart attack Mother   . Emphysema Father   . Lung cancer Maternal Aunt    Allergies  Allergen Reactions  . No Known Allergies     OBJECTIVE: Blood pressure 131/64, pulse 61, temperature 98.1 F (36.7 C), temperature source Oral, resp. rate 16, height 5\' 5"  (1.651 m), weight 103.9 kg, SpO2 100 %.  Physical Exam  Constitutional: She is oriented to person, place, and time.    She is resting quietly in bed visiting with family.  Cardiovascular: Normal rate and regular rhythm.  Murmur heard. She has a 2/6 systolic murmur heard best at the left upper sternal border.  She has crisp valve sounds.  There is no diastolic murmur.  Pulmonary/Chest: Effort normal and breath sounds normal.  Neurological: She is alert and oriented to person, place, and time.  Skin:  Has varicose veins of her left leg with some pitting edema of her lower leg.  There is no unusual erythema or warmth at this time.  Psychiatric: She has a normal mood and affect.    Lab Results Lab Results  Component Value Date   WBC 10.0 05/29/2018   HGB 14.2 05/29/2018   HCT 45.9 05/29/2018   MCV 99.8 05/29/2018   PLT 196 05/29/2018    Lab Results  Component Value Date   CREATININE 1.46 (H) 05/30/2018   BUN 33 (H) 05/30/2018   NA 134 (L) 05/30/2018   K 4.4 05/30/2018   CL 106 05/30/2018   CO2 22 05/30/2018    Lab Results  Component Value Date   ALT 17 05/29/2018   AST 34 05/29/2018   ALKPHOS 109 05/29/2018   BILITOT 1.5 (H) 05/29/2018     Microbiology: Recent Results (from the past 240 hour(s))  Culture, blood (Routine x 2)     Status: Abnormal (Preliminary result)   Collection Time: 05/29/18  1:50 AM  Result Value Ref Range Status   Specimen Description BLOOD RIGHT ANTECUBITAL  Final   Special Requests   Final    BOTTLES DRAWN AEROBIC AND ANAEROBIC Blood Culture adequate volume   Culture  Setup Time   Final    GRAM POSITIVE COCCI IN CHAINS BLOOD AEROBIC BOTTLE CRITICAL RESULT CALLED TO, READ BACK BY AND VERIFIED WITH: PHARMD ERIN D 1639 [id number] FCP    Culture (A)  Final    GROUP B STREP(S.AGALACTIAE)ISOLATED SUSCEPTIBILITIES TO FOLLOW Performed at [facility], [address], [place], [place] [address]    Report Status PENDING  Incomplete  Blood Culture ID Panel (Reflexed)     Status: Abnormal   Collection Time: 05/29/18  1:50 AM  Result Value Ref Range Status    Enterococcus species NOT DETECTED NOT DETECTED Final   Listeria monocytogenes NOT DETECTED NOT DETECTED Final   Staphylococcus species NOT DETECTED NOT DETECTED Final   Staphylococcus aureus (BCID) NOT DETECTED NOT DETECTED Final   Streptococcus species DETECTED (A) NOT DETECTED Final    Comment: CRITICAL RESULT CALLED TO, READ BACK BY AND VERIFIED WITH: PHARMD ERIN D 1639 [id number] FCP    Streptococcus agalactiae DETECTED (A) NOT DETECTED Final    Comment:  CRITICAL RESULT CALLED TO, READ BACK BY AND VERIFIED WITH: PHARMD ERIN D 1639 [id number] FCP    Streptococcus pneumoniae NOT DETECTED NOT DETECTED Final   Streptococcus pyogenes NOT DETECTED NOT DETECTED Final   Acinetobacter baumannii NOT DETECTED NOT DETECTED Final   Enterobacteriaceae species NOT DETECTED NOT DETECTED Final   Enterobacter cloacae complex NOT DETECTED NOT DETECTED Final   Escherichia coli NOT DETECTED NOT DETECTED Final   Klebsiella oxytoca NOT DETECTED NOT DETECTED Final   Klebsiella pneumoniae NOT DETECTED NOT DETECTED Final   Proteus species NOT DETECTED NOT DETECTED Final   Serratia marcescens NOT DETECTED NOT DETECTED Final   Haemophilus influenzae NOT DETECTED NOT DETECTED Final   Neisseria meningitidis NOT DETECTED NOT DETECTED Final   Pseudomonas aeruginosa NOT DETECTED NOT DETECTED Final   Candida albicans NOT DETECTED NOT DETECTED Final   Candida glabrata NOT DETECTED NOT DETECTED Final   Candida krusei NOT DETECTED NOT DETECTED Final   Candida parapsilosis NOT DETECTED NOT DETECTED Final   Candida tropicalis NOT DETECTED NOT DETECTED Final    Comment: Performed at [facility], [address], [place], [place] [address]  Culture, blood (Routine x 2)     Status: Abnormal (Preliminary result)   Collection Time: 05/29/18  2:00 AM  Result Value Ref Range Status   Specimen Description BLOOD LEFT ANTECUBITAL  Final   Special Requests   Final    BOTTLES DRAWN AEROBIC AND ANAEROBIC Blood Culture adequate  volume   Culture  Setup Time   Final    GRAM POSITIVE COCCI IN CHAINS IN BOTH AEROBIC AND ANAEROBIC BOTTLES CRITICAL VALUE NOTED.  VALUE IS CONSISTENT WITH PREVIOUSLY REPORTED AND CALLED VALUE.    Culture (A)  Final    GROUP B STREP(S.AGALACTIAE)ISOLATED SUSCEPTIBILITIES TO FOLLOW Performed at [facility], [address] [place], [place] [address]    Report Status PENDING  Incomplete    Cliffton Asters, MD [facility] [phone] pager   [phone] cell 05/30/2018, 4:01 PM

## 2018-05-30 NOTE — Progress Notes (Signed)
PROGRESS NOTE    Christine Hicks  WUJ:[id number] DOB: 07/20/1956 DOA: 05/29/2018 PCP: Annita Brod, MD  Brief Narrative: Christine Hicks is a 63 y.o. 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  Streptococcus agalactiae bacteremia -Etiology is unclear, could have had mild cellulitis of her left thigh-patient did report some thigh tenderness 2 days ago, exam unimpressive at this time -Repeat blood cultures -Check 2D echocardiogram, h/o AVR -Infectious disease consulted -Currently on IV penicillin day 1  2.  Chronic orthostatic hypotension -Midodrine at baseline, resumed  3.  Chronic diastolic CHF -He is on spironolactone and Demadex at baseline -Due to sepsis, mild acute kidney injury will hold above diuretics  4.  H/o severe aortic stenosis  -status post porcine aortic valve replacement  5.  Acute kidney injury and chronic kidney disease 2 -Hold spironolactone and Demadex now, monitor kidney function  6.  Chronic atrial fibrillation -Stable, continue Eliquis  DVT prophylaxis: On Eliquis Code Status: Full code Family Communication: No family at bedside Disposition Plan: Home pending above work-up  Consultants:   Infectious disease Dr. Orvan Falconer   Procedures:   Antimicrobials:    Subjective: -Much better this morning -No fevers or chills, mild left thigh pain is improving  Objective: Vitals:   05/29/18 0934 05/29/18 1325 05/29/18 2224 05/30/18 0636  BP: (!) 107/59 109/65 116/63 110/68  Pulse: 75 67 62 (!) 51  Resp: 18 18 20 16   Temp: 99.3 F (37.4 C) 99.1 F (37.3 C)  97.9 F (36.6  C)  TempSrc: Oral Oral  Oral  SpO2: 98% 94% 97% 100%  Weight: 103.9 kg     Height: 5\' 5"  (1.651 m)       Intake/Output Summary (Last 24 hours) at 05/30/2018 1103 Last data filed at 05/29/2018 1500 Gross per 24 hour  Intake 200 ml  Output 500 ml  Net -300 ml   Filed Weights   05/29/18 0200 05/29/18 0934  Weight: 105.5 kg 103.9 kg    Examination:  General exam: AAOx3, no distress Respiratory system: CTAB Cardiovascular system: S1 & S2 heard, RRR. Systolic murmur Gastrointestinal system: Obese, large ventral hernia repair scar secondary healing, soft, bowel sounds present Central nervous system: Alert and oriented. No focal neurological deficits. Extremities: Hyperpigmentation bilaterally with large varicose veins, mild left thigh tenderness Skin: As above Psychiatry: Judgement and insight appear normal. Mood & affect appropriate.     Data Reviewed:   CBC: Recent Labs  Lab 05/29/18 0204  WBC 10.0  NEUTROABS 8.5*  HGB 14.2  HCT 45.9  MCV 99.8  PLT 196   Basic Metabolic Panel: Recent Labs  Lab 05/29/18 0204 05/29/18 0956 05/30/18 0341  NA 136 138 134*  K 4.1 3.9 4.4  CL 102 110 106  CO2 25 20* 22  GLUCOSE 99 134* 145*  BUN 26* 27* 33*  CREATININE 1.23* 1.18* 1.46*  CALCIUM 9.8 8.8* 9.3   GFR: Estimated Creatinine Clearance: 47.2 mL/min (A) (by C-G formula based on SCr of 1.46 mg/dL (H)). Liver Function Tests: Recent Labs  Lab 05/29/18 0204  AST 34  ALT 17  ALKPHOS 109  BILITOT 1.5*  PROT 8.0  ALBUMIN 4.1   No results for input(s): LIPASE,  AMYLASE in the last 168 hours. No results for input(s): AMMONIA in the last 168 hours. Coagulation Profile: Recent Labs  Lab 05/29/18 0204  INR 1.56   Cardiac Enzymes: Recent Labs  Lab 05/29/18 0956  CKTOTAL 47   BNP (last 3 results) No results for input(s): PROBNP in the last 8760 hours. HbA1C: No results for input(s): HGBA1C in the last 72 hours. CBG: Recent Labs  Lab 05/29/18 0238  GLUCAP 95    Lipid Profile: No results for input(s): CHOL, HDL, LDLCALC, TRIG, CHOLHDL, LDLDIRECT in the last 72 hours. Thyroid Function Tests: No results for input(s): TSH, T4TOTAL, FREET4, T3FREE, THYROIDAB in the last 72 hours. Anemia Panel: No results for input(s): VITAMINB12, FOLATE, FERRITIN, TIBC, IRON, RETICCTPCT in the last 72 hours. Urine analysis:    Component Value Date/Time   COLORURINE YELLOW 05/29/2018 0455   APPEARANCEUR CLEAR 05/29/2018 0455   LABSPEC 1.010 05/29/2018 0455   PHURINE 5.0 05/29/2018 0455   GLUCOSEU NEGATIVE 05/29/2018 0455   HGBUR NEGATIVE 05/29/2018 0455   BILIRUBINUR NEGATIVE 05/29/2018 0455   KETONESUR NEGATIVE 05/29/2018 0455   PROTEINUR NEGATIVE 05/29/2018 0455   UROBILINOGEN 0.2 06/22/2010 2055   NITRITE NEGATIVE 05/29/2018 0455   LEUKOCYTESUR SMALL (A) 05/29/2018 0455   Sepsis Labs: @LABRCNTIP (procalcitonin:4,lacticidven:4)  ) Recent Results (from the past 240 hour(s))  Culture, blood (Routine x 2)     Status: None (Preliminary result)   Collection Time: 05/29/18  1:50 AM  Result Value Ref Range Status   Specimen Description BLOOD RIGHT ANTECUBITAL  Final   Special Requests   Final    BOTTLES DRAWN AEROBIC AND ANAEROBIC Blood Culture adequate volume   Culture  Setup Time   Final    GRAM POSITIVE COCCI IN CHAINS BLOOD AEROBIC BOTTLE CRITICAL RESULT CALLED TO, READ BACK BY AND VERIFIED WITH: Regino Bellow D 1639 [id number] FCP Performed at [facility], [address], [place], [place] [address]    Culture GRAM POSITIVE COCCI  Final   Report Status PENDING  Incomplete  Blood Culture ID Panel (Reflexed)     Status: Abnormal   Collection Time: 05/29/18  1:50 AM  Result Value Ref Range Status   Enterococcus species NOT DETECTED NOT DETECTED Final   Listeria monocytogenes NOT DETECTED NOT DETECTED Final   Staphylococcus species NOT DETECTED NOT DETECTED Final   Staphylococcus aureus (BCID) NOT DETECTED NOT DETECTED Final   Streptococcus species  DETECTED (A) NOT DETECTED Final    Comment: CRITICAL RESULT CALLED TO, READ BACK BY AND VERIFIED WITH: PHARMD ERIN D 1639 [id number] FCP    Streptococcus agalactiae DETECTED (A) NOT DETECTED Final    Comment: CRITICAL RESULT CALLED TO, READ BACK BY AND VERIFIED WITH: PHARMD ERIN D 1639 [id number] FCP    Streptococcus pneumoniae NOT DETECTED NOT DETECTED Final   Streptococcus pyogenes NOT DETECTED NOT DETECTED Final   Acinetobacter baumannii NOT DETECTED NOT DETECTED Final   Enterobacteriaceae species NOT DETECTED NOT DETECTED Final   Enterobacter cloacae complex NOT DETECTED NOT DETECTED Final   Escherichia coli NOT DETECTED NOT DETECTED Final   Klebsiella oxytoca NOT DETECTED NOT DETECTED Final   Klebsiella pneumoniae NOT DETECTED NOT DETECTED Final   Proteus species NOT DETECTED NOT DETECTED Final   Serratia marcescens NOT DETECTED NOT DETECTED Final   Haemophilus influenzae NOT DETECTED NOT DETECTED Final   Neisseria meningitidis NOT DETECTED NOT DETECTED Final   Pseudomonas aeruginosa NOT DETECTED NOT DETECTED Final   Candida albicans NOT DETECTED NOT DETECTED Final  Candida glabrata NOT DETECTED NOT DETECTED Final   Candida krusei NOT DETECTED NOT DETECTED Final   Candida parapsilosis NOT DETECTED NOT DETECTED Final   Candida tropicalis NOT DETECTED NOT DETECTED Final    Comment: Performed at [facility], [address] [place], [place] [address]  Culture, blood (Routine x 2)     Status: None (Preliminary result)   Collection Time: 05/29/18  2:00 AM  Result Value Ref Range Status   Specimen Description BLOOD LEFT ANTECUBITAL  Final   Special Requests   Final    BOTTLES DRAWN AEROBIC AND ANAEROBIC Blood Culture adequate volume   Culture  Setup Time   Final    GRAM POSITIVE COCCI IN CHAINS IN BOTH AEROBIC AND ANAEROBIC BOTTLES Performed at [facility], [address] [place], [place] [address]    Culture PENDING  Incomplete   Report Status PENDING  Incomplete          Radiology Studies: Dg Chest 2 View  Result Date: 05/29/2018 CLINICAL DATA:  62 year old female with sepsis. EXAM: CHEST - 2 VIEW COMPARISON:  Chest radiograph dated 12/31/2017 FINDINGS: There is stable cardiomegaly. There is minimal vascular congestion. No edema. No focal consolidation or pneumothorax. No pleural effusion. Median sternotomy wires. No acute osseous pathology. IMPRESSION: Cardiomegaly with minimal vascular congestion. No edema or focal consolidation. Electronically Signed   By: Elgie Collard M.D.   On: 05/29/2018 05:25        Scheduled Meds: . allopurinol  100 mg Oral Daily  . apixaban  2.5 mg Oral BID  . aspirin EC  81 mg Oral Daily  . [START ON 06/16/2018] cyanocobalamin  1,000 mcg Intramuscular Q30 days  . ferrous sulfate  325 mg Oral BID WC  . fluticasone  1 spray Each Nare Daily  . levothyroxine  175 mcg Oral QAC breakfast  . magnesium oxide  400 mg Oral Daily  . midodrine  2.5 mg Oral TID WC  . pantoprazole  40 mg Oral Daily   Continuous Infusions: . pencillin G potassium IV 4 Million Units (05/30/18 0645)     LOS: 1 day    Time spent:    Zannie Cove, MD [facility] Page via [email or web address], password [email or web address] After 7PM please contact night-coverage  05/30/2018, 11:03 AM

## 2018-05-31 DIAGNOSIS — I110 Hypertensive heart disease with heart failure: Secondary | ICD-10-CM

## 2018-05-31 DIAGNOSIS — I482 Chronic atrial fibrillation, unspecified: Secondary | ICD-10-CM

## 2018-05-31 DIAGNOSIS — Z7901 Long term (current) use of anticoagulants: Secondary | ICD-10-CM

## 2018-05-31 DIAGNOSIS — L814 Other melanin hyperpigmentation: Secondary | ICD-10-CM

## 2018-05-31 DIAGNOSIS — K5900 Constipation, unspecified: Secondary | ICD-10-CM

## 2018-05-31 DIAGNOSIS — I959 Hypotension, unspecified: Secondary | ICD-10-CM

## 2018-05-31 DIAGNOSIS — I5030 Unspecified diastolic (congestive) heart failure: Secondary | ICD-10-CM

## 2018-05-31 DIAGNOSIS — I8393 Asymptomatic varicose veins of bilateral lower extremities: Secondary | ICD-10-CM

## 2018-05-31 DIAGNOSIS — M7989 Other specified soft tissue disorders: Secondary | ICD-10-CM

## 2018-05-31 DIAGNOSIS — Z954 Presence of other heart-valve replacement: Secondary | ICD-10-CM

## 2018-05-31 DIAGNOSIS — Z86718 Personal history of other venous thrombosis and embolism: Secondary | ICD-10-CM

## 2018-05-31 DIAGNOSIS — I350 Nonrheumatic aortic (valve) stenosis: Secondary | ICD-10-CM

## 2018-05-31 LAB — BASIC METABOLIC PANEL
ANION GAP: 5 (ref 5–15)
BUN: 35 mg/dL — ABNORMAL HIGH (ref 8–23)
CALCIUM: 9 mg/dL (ref 8.9–10.3)
CO2: 21 mmol/L — AB (ref 22–32)
CREATININE: 1.2 mg/dL — AB (ref 0.44–1.00)
Chloride: 105 mmol/L (ref 98–111)
GFR calc Af Amer: 55 mL/min — ABNORMAL LOW (ref >60)
GFR, EST NON AFRICAN AMERICAN: 47 mL/min — AB (ref >60)
GLUCOSE: 138 mg/dL — AB (ref 70–99)
Potassium: 4.4 mmol/L (ref 3.5–5.1)
Sodium: 131 mmol/L — ABNORMAL LOW (ref 135–145)

## 2018-05-31 LAB — CULTURE, BLOOD (ROUTINE X 2)
Special Requests: ADEQUATE
Special Requests: ADEQUATE

## 2018-05-31 LAB — CBC
HEMATOCRIT: 40.1 % (ref 36.0–46.0)
Hemoglobin: 12.7 g/dL (ref 12.0–15.0)
MCH: 31.6 pg (ref 26.0–34.0)
MCHC: 31.7 g/dL (ref 30.0–36.0)
MCV: 99.8 fL (ref 80.0–100.0)
NRBC: 0 % (ref 0.0–0.2)
PLATELETS: 155 10*3/uL (ref 150–400)
RBC: 4.02 MIL/uL (ref 3.87–5.11)
RDW: 14.9 % (ref 11.5–15.5)
WBC: 10.2 10*3/uL (ref 4.0–10.5)

## 2018-05-31 MED ORDER — SPIRONOLACTONE 25 MG PO TABS
25.0000 mg | ORAL_TABLET | Freq: Every day | ORAL | Status: DC
  Administered 2018-05-31 – 2018-06-03 (×4): 25 mg via ORAL
  Filled 2018-05-31 (×4): qty 1

## 2018-05-31 MED ORDER — TORSEMIDE 20 MG PO TABS
20.0000 mg | ORAL_TABLET | Freq: Two times a day (BID) | ORAL | Status: DC
  Administered 2018-06-01 – 2018-06-02 (×3): 20 mg via ORAL
  Filled 2018-05-31 (×3): qty 1

## 2018-05-31 NOTE — Progress Notes (Signed)
PROGRESS NOTE    Christine Hicks  ZOX:[id number] DOB: Nov 19, 1955 DOA: 05/29/2018 PCP: Annita Brod, MD  Brief Narrative: Christine Hicks is a 62 y.o. 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  Streptococcus agalactiae bacteremia -Etiology is suspected to be early left thigh cellulitis-patient did report some thigh tenderness 3 days ago, exam unimpressive at this time -Repeat blood cultures negative thus far -2D echocardiogram-without evidence of endocarditis, h/o AVR -Infectious disease consult appreciated -Currently on IV penicillin day 2 -improving  2.  Chronic orthostatic hypotension -Midodrine at baseline, resumed  3.  Chronic diastolic CHF -He is on spironolactone and Demadex at baseline -Due to sepsis, mild acute kidney injury will hold above diuretics  4.  H/o severe aortic stenosis  -status post porcine aortic valve replacement  5.  Acute kidney injury and chronic kidney disease 2 -resume spironolactone and Demadex now, monitor kidney function  6.  Chronic atrial fibrillation -Stable, continue Eliquis  DVT prophylaxis: On Eliquis Code Status: Full code Family Communication: No family at bedside Disposition Plan: Home pending above work-up  Consultants:   Infectious disease Dr. Orvan Falconer   Procedures:   Antimicrobials:    Subjective: -feels better, no thigh swelling and pain  Objective: Vitals:   05/30/18 2156 05/31/18 0613 05/31/18 0954 05/31/18 1319  BP: (!) 115/54 140/66 127/69 133/74  Pulse: 66 77 72 66  Resp: 20 20 20 18   Temp: 97.7 F (36.5 C) 97.9 F (36.6 C)  97.7  F (36.5 C)  TempSrc: Oral Oral    SpO2: 100% 96% 100% 100%  Weight:      Height:        Intake/Output Summary (Last 24 hours) at 05/31/2018 1416 Last data filed at 05/31/2018 1300 Gross per 24 hour  Intake 1450 ml  Output 500 ml  Net 950 ml   Filed Weights   05/29/18 0200 05/29/18 0934  Weight: 105.5 kg 103.9 kg    Examination:  Gen: Awake, Alert, Oriented X 3,  HEENT: PERRLA, Neck supple, no JVD Lungs: Good air movement bilaterally, CTAB CVS: RRR,No Gallops,Rubs or new Murmurs Abd: soft, Non tender, non distended, BS present Extremities:  Hyperpigmentation bilaterally with large varicose veins, mild left thigh tenderness, improving Skin: As above Psychiatry: Judgement and insight appear normal. Mood & affect appropriate.     Data Reviewed:   CBC: Recent Labs  Lab 05/29/18 0204 05/31/18 0432  WBC 10.0 10.2  NEUTROABS 8.5*  --   HGB 14.2 12.7  HCT 45.9 40.1  MCV 99.8 99.8  PLT 196 155   Basic Metabolic Panel: Recent Labs  Lab 05/29/18 0204 05/29/18 0956 05/30/18 0341 05/31/18 0432  NA 136 138 134* 131*  K 4.1 3.9 4.4 4.4  CL 102 110 106 105  CO2 25 20* 22 21*  GLUCOSE 99 134* 145* 138*  BUN 26* 27* 33* 35*  CREATININE 1.23* 1.18* 1.46* 1.20*  CALCIUM 9.8 8.8* 9.3 9.0   GFR: Estimated Creatinine Clearance: 57.4 mL/min (A) (by C-G formula based on SCr of 1.2 mg/dL (H)). Liver Function Tests: Recent Labs  Lab 05/29/18 0204  AST 34  ALT 17  ALKPHOS 109  BILITOT 1.5*  PROT 8.0  ALBUMIN 4.1  No results for input(s): LIPASE, AMYLASE in the last 168 hours. No results for input(s): AMMONIA in the last 168 hours. Coagulation Profile: Recent Labs  Lab 05/29/18 0204  INR 1.56   Cardiac Enzymes: Recent Labs  Lab 05/29/18 0956  CKTOTAL 47   BNP (last 3 results) No results for input(s): PROBNP in the last 8760 hours. HbA1C: No results for input(s): HGBA1C in the last 72 hours. CBG: Recent Labs  Lab 05/29/18 0238  GLUCAP 95   Lipid  Profile: No results for input(s): CHOL, HDL, LDLCALC, TRIG, CHOLHDL, LDLDIRECT in the last 72 hours. Thyroid Function Tests: No results for input(s): TSH, T4TOTAL, FREET4, T3FREE, THYROIDAB in the last 72 hours. Anemia Panel: No results for input(s): VITAMINB12, FOLATE, FERRITIN, TIBC, IRON, RETICCTPCT in the last 72 hours. Urine analysis:    Component Value Date/Time   COLORURINE YELLOW 05/29/2018 0455   APPEARANCEUR CLEAR 05/29/2018 0455   LABSPEC 1.010 05/29/2018 0455   PHURINE 5.0 05/29/2018 0455   GLUCOSEU NEGATIVE 05/29/2018 0455   HGBUR NEGATIVE 05/29/2018 0455   BILIRUBINUR NEGATIVE 05/29/2018 0455   KETONESUR NEGATIVE 05/29/2018 0455   PROTEINUR NEGATIVE 05/29/2018 0455   UROBILINOGEN 0.2 06/22/2010 2055   NITRITE NEGATIVE 05/29/2018 0455   LEUKOCYTESUR SMALL (A) 05/29/2018 0455   Sepsis Labs: @LABRCNTIP (procalcitonin:4,lacticidven:4)  ) Recent Results (from the past 240 hour(s))  Culture, blood (Routine x 2)     Status: Abnormal   Collection Time: 05/29/18  1:50 AM  Result Value Ref Range Status   Specimen Description BLOOD RIGHT ANTECUBITAL  Final   Special Requests   Final    BOTTLES DRAWN AEROBIC AND ANAEROBIC Blood Culture adequate volume   Culture  Setup Time   Final    GRAM POSITIVE COCCI IN CHAINS BLOOD AEROBIC BOTTLE CRITICAL RESULT CALLED TO, READ BACK BY AND VERIFIED WITH: Regino Bellow D 1639 [id number] FCP Performed at [facility], [address], [place], [place] [address]    Culture GROUP B STREP(S.AGALACTIAE)ISOLATED (A)  Final   Report Status 05/31/2018 FINAL  Final   Organism ID, Bacteria GROUP B STREP(S.AGALACTIAE)ISOLATED  Final      Susceptibility   Group b strep(s.agalactiae)isolated - MIC*    CLINDAMYCIN <=0.25 SENSITIVE Sensitive     AMPICILLIN <=0.25 SENSITIVE Sensitive     ERYTHROMYCIN <=0.12 SENSITIVE Sensitive     VANCOMYCIN 0.5 SENSITIVE Sensitive     CEFTRIAXONE <=0.12 SENSITIVE Sensitive     LEVOFLOXACIN 1 SENSITIVE Sensitive       PENICILLIN Value in next row Sensitive      SENSITIVE<=0.06    * GROUP B STREP(S.AGALACTIAE)ISOLATED  Blood Culture ID Panel (Reflexed)     Status: Abnormal   Collection Time: 05/29/18  1:50 AM  Result Value Ref Range Status   Enterococcus species NOT DETECTED NOT DETECTED Final   Listeria monocytogenes NOT DETECTED NOT DETECTED Final   Staphylococcus species NOT DETECTED NOT DETECTED Final   Staphylococcus aureus (BCID) NOT DETECTED NOT DETECTED Final   Streptococcus species DETECTED (A) NOT DETECTED Final    Comment: CRITICAL RESULT CALLED TO, READ BACK BY AND VERIFIED WITH: PHARMD ERIN D 1639 [id number] FCP    Streptococcus agalactiae DETECTED (A) NOT DETECTED Final    Comment: CRITICAL RESULT CALLED TO, READ BACK BY AND VERIFIED WITH: PHARMD ERIN D 1639 [id number] FCP    Streptococcus pneumoniae NOT DETECTED NOT DETECTED Final   Streptococcus pyogenes NOT DETECTED NOT DETECTED Final   Acinetobacter baumannii NOT DETECTED NOT DETECTED Final   Enterobacteriaceae  species NOT DETECTED NOT DETECTED Final   Enterobacter cloacae complex NOT DETECTED NOT DETECTED Final   Escherichia coli NOT DETECTED NOT DETECTED Final   Klebsiella oxytoca NOT DETECTED NOT DETECTED Final   Klebsiella pneumoniae NOT DETECTED NOT DETECTED Final   Proteus species NOT DETECTED NOT DETECTED Final   Serratia marcescens NOT DETECTED NOT DETECTED Final   Haemophilus influenzae NOT DETECTED NOT DETECTED Final   Neisseria meningitidis NOT DETECTED NOT DETECTED Final   Pseudomonas aeruginosa NOT DETECTED NOT DETECTED Final   Candida albicans NOT DETECTED NOT DETECTED Final   Candida glabrata NOT DETECTED NOT DETECTED Final   Candida krusei NOT DETECTED NOT DETECTED Final   Candida parapsilosis NOT DETECTED NOT DETECTED Final   Candida tropicalis NOT DETECTED NOT DETECTED Final    Comment: Performed at [facility], [address], [place], [place] [address]  Culture, blood (Routine x 2)     Status: Abnormal    Collection Time: 05/29/18  2:00 AM  Result Value Ref Range Status   Specimen Description BLOOD LEFT ANTECUBITAL  Final   Special Requests   Final    BOTTLES DRAWN AEROBIC AND ANAEROBIC Blood Culture adequate volume   Culture  Setup Time   Final    GRAM POSITIVE COCCI IN CHAINS IN BOTH AEROBIC AND ANAEROBIC BOTTLES CRITICAL VALUE NOTED.  VALUE IS CONSISTENT WITH PREVIOUSLY REPORTED AND CALLED VALUE.    Culture (A)  Final    GROUP B STREP(S.AGALACTIAE)ISOLATED SUSCEPTIBILITIES PERFORMED ON PREVIOUS CULTURE WITHIN THE LAST 5 DAYS. Performed at [facility], [address] [place], [place] [address]    Report Status 05/31/2018 FINAL  Final         Radiology Studies: No results found.      Scheduled Meds: . allopurinol  100 mg Oral Daily  . apixaban  2.5 mg Oral BID  . aspirin EC  81 mg Oral Daily  . [START ON 06/16/2018] cyanocobalamin  1,000 mcg Intramuscular Q30 days  . ferrous sulfate  325 mg Oral BID WC  . fluticasone  1 spray Each Nare Daily  . levothyroxine  175 mcg Oral QAC breakfast  . magnesium oxide  400 mg Oral Daily  . midodrine  2.5 mg Oral TID WC  . pantoprazole  40 mg Oral Daily   Continuous Infusions: . pencillin G potassium IV 4 Million Units (05/31/18 1123)     LOS: 2 days    Time spent:    Zannie Cove, MD [facility] Page via [email or web address], password [email or web address] After 7PM please contact night-coverage  05/31/2018, 2:16 PM

## 2018-05-31 NOTE — Progress Notes (Signed)
[facility] Infusion Coordinator will follow pt with ID team to support Home Infusion Pharmacy services at [facility] if IV ABX are ordered.  If patient discharges after hours, please call [phone].   Sedalia Muta 05/31/2018, 7:28 AM

## 2018-05-31 NOTE — Progress Notes (Signed)
Please see my separate note for complete details.  Gardiner Barefoot, MD    [facility]  Date of Admission:  05/29/2018   Total days of antibiotics 3          ASSESSMENT: Christine Hicks is a 62 y.o. female with history h/o morbid obesity, hypertension, aortic stenosis status post aortic valve replacement (porcine), diastolic CHF, history of right lower extremity DVT,chronic atrial fibrillation on anticoagulation with Eliquis admitted for streptococcus agalactiae bacteremia likely secondary to early left leg cellulitis. TEE on 11/3 w/o evidence of vegetations. Repeat blood cultures pending (10/3). Pt remains afebrile w/o leukocytosis on penicillin.   PLAN: 1. Continue penicillin 2. 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 allopurinol  100 mg Oral Daily  . apixaban  2.5 mg Oral BID  . aspirin EC  81 mg Oral Daily  . [START ON 06/16/2018] cyanocobalamin  1,000 mcg Intramuscular Q30 days  . ferrous sulfate  325 mg Oral BID WC  . fluticasone  1 spray Each Nare Daily  . levothyroxine  175 mcg Oral QAC breakfast  . magnesium oxide  400 mg Oral Daily  . midodrine  2.5 mg Oral TID WC  . pantoprazole  40 mg Oral Daily   Continuous Infusions: . pencillin G potassium IV 4 Million Units (05/31/18 1610)   PRN Meds:.HYDROcodone-acetaminophen, ondansetron **OR** ondansetron (ZOFRAN) IV, polyethylene glycol   SUBJECTIVE: Reports overall feels a lot better. Tolerating PO well, reports no longer having chills or fevers. No n/v but has had some constipation (last BM 3 days ago). She is eager to go home and reports her left leg soreness has improved.   Review of Systems: Review of Systems  Constitutional: Negative for chills and fever.  Respiratory: Negative for  shortness of breath.   Cardiovascular: Positive for leg swelling.  Gastrointestinal: Positive for constipation. Negative for abdominal pain, diarrhea, nausea and vomiting.  Skin: Negative for rash.    Allergies  Allergen Reactions  . No Known Allergies     OBJECTIVE: Vitals:   05/30/18 1114 05/30/18 1609 05/30/18 2156 05/31/18 0613  BP: 131/64 (!) 118/57 (!) 115/54 140/66  Pulse: 61 71 66 77  Resp:   20 20  Temp: 98.1 F (36.7 C) 98.5 F (36.9 C) 97.7 F (36.5 C) 97.9 F (36.6 C)  TempSrc: Oral  Oral Oral  SpO2:  100% 100% 96%  Weight:      Height:       Body mass index is 38.12 kg/m.  Physical Exam  Constitutional: She is oriented to person, place, and time. She appears well-developed and well-nourished.  HENT:  Head: Atraumatic.  Eyes: EOM are normal.  Neck: Normal range of motion. Neck supple.  Pulmonary/Chest: Effort normal. No respiratory distress.  Abdominal: Soft.  Neurological: She is alert and oriented to person, place, and time.  Skin: Skin is warm and dry.  Hyperpigmentation in bilateral LE with large varicose veins, chronic    Lab Results Lab Results  Component Value Date   WBC 10.2 05/31/2018   HGB 12.7 05/31/2018   HCT 40.1 05/31/2018   MCV 99.8 05/31/2018   PLT 155 05/31/2018    Lab Results  Component Value Date   CREATININE 1.20 (H) 05/31/2018   BUN 35 (H) 05/31/2018  NA 131 (L) 05/31/2018   K 4.4 05/31/2018   CL 105 05/31/2018   CO2 21 (L) 05/31/2018    Lab Results  Component Value Date   ALT 17 05/29/2018   AST 34 05/29/2018   ALKPHOS 109 05/29/2018   BILITOT 1.5 (H) 05/29/2018     Microbiology: Recent Results (from the past 240 hour(s))  Culture, blood (Routine x 2)     Status: Abnormal   Collection Time: 05/29/18  1:50 AM  Result Value Ref Range Status   Specimen Description BLOOD RIGHT ANTECUBITAL  Final   Special Requests   Final    BOTTLES DRAWN AEROBIC AND ANAEROBIC Blood Culture adequate volume   Culture  Setup  Time   Final    GRAM POSITIVE COCCI IN CHAINS BLOOD AEROBIC BOTTLE CRITICAL RESULT CALLED TO, READ BACK BY AND VERIFIED WITH: PHARMD ERIN D 1639 [id number] FCP Performed at [facility], [address], [place], [place] [address]    Culture GROUP B STREP(S.AGALACTIAE)ISOLATED (A)  Final   Report Status 05/31/2018 FINAL  Final   Organism ID, Bacteria GROUP B STREP(S.AGALACTIAE)ISOLATED  Final      Susceptibility   Group b strep(s.agalactiae)isolated - MIC*    CLINDAMYCIN <=0.25 SENSITIVE Sensitive     AMPICILLIN <=0.25 SENSITIVE Sensitive     ERYTHROMYCIN <=0.12 SENSITIVE Sensitive     VANCOMYCIN 0.5 SENSITIVE Sensitive     CEFTRIAXONE <=0.12 SENSITIVE Sensitive     LEVOFLOXACIN 1 SENSITIVE Sensitive     PENICILLIN Value in next row Sensitive      SENSITIVE<=0.06    * GROUP B STREP(S.AGALACTIAE)ISOLATED  Blood Culture ID Panel (Reflexed)     Status: Abnormal   Collection Time: 05/29/18  1:50 AM  Result Value Ref Range Status   Enterococcus species NOT DETECTED NOT DETECTED Final   Listeria monocytogenes NOT DETECTED NOT DETECTED Final   Staphylococcus species NOT DETECTED NOT DETECTED Final   Staphylococcus aureus (BCID) NOT DETECTED NOT DETECTED Final   Streptococcus species DETECTED (A) NOT DETECTED Final    Comment: CRITICAL RESULT CALLED TO, READ BACK BY AND VERIFIED WITH: PHARMD ERIN D 1639 [id number] FCP    Streptococcus agalactiae DETECTED (A) NOT DETECTED Final    Comment: CRITICAL RESULT CALLED TO, READ BACK BY AND VERIFIED WITH: PHARMD ERIN D 1639 [id number] FCP    Streptococcus pneumoniae NOT DETECTED NOT DETECTED Final   Streptococcus pyogenes NOT DETECTED NOT DETECTED Final   Acinetobacter baumannii NOT DETECTED NOT DETECTED Final   Enterobacteriaceae species NOT DETECTED NOT DETECTED Final   Enterobacter cloacae complex NOT DETECTED NOT DETECTED Final   Escherichia coli NOT DETECTED NOT DETECTED Final   Klebsiella oxytoca NOT DETECTED NOT DETECTED Final   Klebsiella  pneumoniae NOT DETECTED NOT DETECTED Final   Proteus species NOT DETECTED NOT DETECTED Final   Serratia marcescens NOT DETECTED NOT DETECTED Final   Haemophilus influenzae NOT DETECTED NOT DETECTED Final   Neisseria meningitidis NOT DETECTED NOT DETECTED Final   Pseudomonas aeruginosa NOT DETECTED NOT DETECTED Final   Candida albicans NOT DETECTED NOT DETECTED Final   Candida glabrata NOT DETECTED NOT DETECTED Final   Candida krusei NOT DETECTED NOT DETECTED Final   Candida parapsilosis NOT DETECTED NOT DETECTED Final   Candida tropicalis NOT DETECTED NOT DETECTED Final    Comment: Performed at [facility], [address], [place], [place] [address]  Culture, blood (Routine x 2)     Status: Abnormal   Collection Time: 05/29/18  2:00  AM  Result Value Ref Range Status   Specimen Description BLOOD LEFT ANTECUBITAL  Final   Special Requests   Final    BOTTLES DRAWN AEROBIC AND ANAEROBIC Blood Culture adequate volume   Culture  Setup Time   Final    GRAM POSITIVE COCCI IN CHAINS IN BOTH AEROBIC AND ANAEROBIC BOTTLES CRITICAL VALUE NOTED.  VALUE IS CONSISTENT WITH PREVIOUSLY REPORTED AND CALLED VALUE.    Culture (A)  Final    GROUP B STREP(S.AGALACTIAE)ISOLATED SUSCEPTIBILITIES PERFORMED ON PREVIOUS CULTURE WITHIN THE LAST 5 DAYS. Performed at [facility], [address], [place], [place] [address]    Report Status 05/31/2018 FINAL  Final    April  Eulah Citizen, MD [facility] [phone] pager   [phone] cell 05/31/2018, 9:03 AM

## 2018-05-31 NOTE — Plan of Care (Signed)
  Problem: Nutrition: Goal: Adequate nutrition will be maintained Outcome: Progressing   Problem: Elimination: Goal: Will not experience complications related to bowel motility Outcome: Progressing   Problem: Pain Managment: Goal: General experience of comfort will improve Outcome: Progressing   

## 2018-05-31 NOTE — Progress Notes (Signed)
[facility]   Reason for visit: Follow up on bacteremia  Interval History: TTE without obvious vegetation.  Repeat blood cultures sent yesterday. No associated rash or diarrhea.  No new complaints.    Physical Exam: Constitutional:  Vitals:   05/31/18 0613 05/31/18 0954  BP: 140/66 127/69  Pulse: 77 72  Resp: 20 20  Temp: 97.9 F (36.6 C)   SpO2: 96% 100%   patient appears in NAD Eyes: anicteric Respiratory: Normal respiratory effort; CTA B Cardiovascular: RRR MS: no erythema, left leg with slight increased warmth  Review of Systems: Constitutional: negative for fevers and chills Gastrointestinal: negative for diarrhea  Lab Results  Component Value Date   WBC 10.2 05/31/2018   HGB 12.7 05/31/2018   HCT 40.1 05/31/2018   MCV 99.8 05/31/2018   PLT 155 05/31/2018    Lab Results  Component Value Date   CREATININE 1.20 (H) 05/31/2018   BUN 35 (H) 05/31/2018   NA 131 (L) 05/31/2018   K 4.4 05/31/2018   CL 105 05/31/2018   CO2 21 (L) 05/31/2018    Lab Results  Component Value Date   ALT 17 05/29/2018   AST 34 05/29/2018   ALKPHOS 109 05/29/2018     Microbiology: Recent Results (from the past 240 hour(s))  Culture, blood (Routine x 2)     Status: Abnormal   Collection Time: 05/29/18  1:50 AM  Result Value Ref Range Status   Specimen Description BLOOD RIGHT ANTECUBITAL  Final   Special Requests   Final    BOTTLES DRAWN AEROBIC AND ANAEROBIC Blood Culture adequate volume   Culture  Setup Time   Final    GRAM POSITIVE COCCI IN CHAINS BLOOD AEROBIC BOTTLE CRITICAL RESULT CALLED TO, READ BACK BY AND VERIFIED WITH: PHARMD ERIN D 1639 [id number] FCP Performed at [facility], [address], [place], [place] [address]    Culture GROUP B STREP(S.AGALACTIAE)ISOLATED (A)  Final   Report Status 05/31/2018 FINAL  Final   Organism ID, Bacteria GROUP B STREP(S.AGALACTIAE)ISOLATED  Final      Susceptibility   Group b  strep(s.agalactiae)isolated - MIC*    CLINDAMYCIN <=0.25 SENSITIVE Sensitive     AMPICILLIN <=0.25 SENSITIVE Sensitive     ERYTHROMYCIN <=0.12 SENSITIVE Sensitive     VANCOMYCIN 0.5 SENSITIVE Sensitive     CEFTRIAXONE <=0.12 SENSITIVE Sensitive     LEVOFLOXACIN 1 SENSITIVE Sensitive     PENICILLIN Value in next row Sensitive      SENSITIVE<=0.06    * GROUP B STREP(S.AGALACTIAE)ISOLATED  Blood Culture ID Panel (Reflexed)     Status: Abnormal   Collection Time: 05/29/18  1:50 AM  Result Value Ref Range Status   Enterococcus species NOT DETECTED NOT DETECTED Final   Listeria monocytogenes NOT DETECTED NOT DETECTED Final   Staphylococcus species NOT DETECTED NOT DETECTED Final   Staphylococcus aureus (BCID) NOT DETECTED NOT DETECTED Final   Streptococcus species DETECTED (A) NOT DETECTED Final    Comment: CRITICAL RESULT CALLED TO, READ BACK BY AND VERIFIED WITH: PHARMD ERIN D 1639 [id number] FCP    Streptococcus agalactiae DETECTED (A) NOT DETECTED Final    Comment: CRITICAL RESULT CALLED TO, READ BACK BY AND VERIFIED WITH: PHARMD ERIN D 1639 [id number] FCP    Streptococcus pneumoniae NOT DETECTED NOT DETECTED Final   Streptococcus pyogenes NOT DETECTED NOT DETECTED Final   Acinetobacter baumannii NOT DETECTED NOT DETECTED Final   Enterobacteriaceae species NOT DETECTED NOT DETECTED Final  Enterobacter cloacae complex NOT DETECTED NOT DETECTED Final   Escherichia coli NOT DETECTED NOT DETECTED Final   Klebsiella oxytoca NOT DETECTED NOT DETECTED Final   Klebsiella pneumoniae NOT DETECTED NOT DETECTED Final   Proteus species NOT DETECTED NOT DETECTED Final   Serratia marcescens NOT DETECTED NOT DETECTED Final   Haemophilus influenzae NOT DETECTED NOT DETECTED Final   Neisseria meningitidis NOT DETECTED NOT DETECTED Final   Pseudomonas aeruginosa NOT DETECTED NOT DETECTED Final   Candida albicans NOT DETECTED NOT DETECTED Final   Candida glabrata NOT DETECTED NOT DETECTED Final   Candida  krusei NOT DETECTED NOT DETECTED Final   Candida parapsilosis NOT DETECTED NOT DETECTED Final   Candida tropicalis NOT DETECTED NOT DETECTED Final    Comment: Performed at [facility], [address], [place], [place] [address]  Culture, blood (Routine x 2)     Status: Abnormal   Collection Time: 05/29/18  2:00 AM  Result Value Ref Range Status   Specimen Description BLOOD LEFT ANTECUBITAL  Final   Special Requests   Final    BOTTLES DRAWN AEROBIC AND ANAEROBIC Blood Culture adequate volume   Culture  Setup Time   Final    GRAM POSITIVE COCCI IN CHAINS IN BOTH AEROBIC AND ANAEROBIC BOTTLES CRITICAL VALUE NOTED.  VALUE IS CONSISTENT WITH PREVIOUSLY REPORTED AND CALLED VALUE.    Culture (A)  Final    GROUP B STREP(S.AGALACTIAE)ISOLATED SUSCEPTIBILITIES PERFORMED ON PREVIOUS CULTURE WITHIN THE LAST 5 DAYS. Performed at [facility], [address], [place], [place] [address]    Report Status 05/31/2018 FINAL  Final    Impression/Plan:  1. Bacteremia - on penicillin and will need to continue.  Repeat cultures sent  2.  AVR - in the setting of bacteremia, particularly with Group B Strep, she will need a TEE to rule out PV endocarditis.    3.  Disposition -will need IV antibiotics at home.  Will order picc after TEE and when blood cultures show clearance at 48-72 hours.

## 2018-06-01 ENCOUNTER — Other Ambulatory Visit: Payer: Self-pay

## 2018-06-01 LAB — BASIC METABOLIC PANEL
ANION GAP: 5 (ref 5–15)
BUN: 25 mg/dL — ABNORMAL HIGH (ref 8–23)
CALCIUM: 8.7 mg/dL — AB (ref 8.9–10.3)
CO2: 21 mmol/L — ABNORMAL LOW (ref 22–32)
Chloride: 105 mmol/L (ref 98–111)
Creatinine, Ser: 0.98 mg/dL (ref 0.44–1.00)
Glucose, Bld: 119 mg/dL — ABNORMAL HIGH (ref 70–99)
Potassium: 4.8 mmol/L (ref 3.5–5.1)
Sodium: 131 mmol/L — ABNORMAL LOW (ref 135–145)

## 2018-06-01 LAB — CBC
HCT: 36.1 % (ref 36.0–46.0)
Hemoglobin: 11.7 g/dL — ABNORMAL LOW (ref 12.0–15.0)
MCH: 32.1 pg (ref 26.0–34.0)
MCHC: 32.4 g/dL (ref 30.0–36.0)
MCV: 99.2 fL (ref 80.0–100.0)
NRBC: 0 % (ref 0.0–0.2)
Platelets: 162 10*3/uL (ref 150–400)
RBC: 3.64 MIL/uL — ABNORMAL LOW (ref 3.87–5.11)
RDW: 15 % (ref 11.5–15.5)
WBC: 8.1 10*3/uL (ref 4.0–10.5)

## 2018-06-01 MED ORDER — INFLUENZA VAC SPLIT QUAD 0.5 ML IM SUSY
0.5000 mL | PREFILLED_SYRINGE | INTRAMUSCULAR | Status: DC
  Filled 2018-06-01: qty 0.5

## 2018-06-01 NOTE — Care Management Important Message (Signed)
Important Message  Patient Details  Name: Christine Hicks MRN: [id number] Date of Birth: 11/24/1955   Medicare Important Message Given:  Yes    Dorena Bodo 06/01/2018, 3:45 PM

## 2018-06-01 NOTE — Progress Notes (Signed)
PROGRESS NOTE    [name]  ZOX:[id number] DOB: 18-Mar-1956 DOA: 05/29/2018 PCP: Annita Brod, MD  Brief Narrative: Christine Hicks is a 63 y.o. 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  Streptococcus agalactiae bacteremia -Etiology is suspected to be early left thigh cellulitis-patient did report some thigh tenderness 3 days ago, exam unimpressive at this time -Repeat cultures negative x48 hours -2D echocardiogram-without evidence of endocarditis, h/o AVR -Infectious disease consult appreciated, TEE recommended, discussed with cardiology for TEE tomorrow 11/6 -Currently on IV penicillin day 3 -improving -Ambulate, physical therapy  2.  Chronic orthostatic hypotension -Midodrine at baseline, resumed  3.  Chronic diastolic CHF -He is on spironolactone and Demadex at baseline -stable, resumed Aldactone and Demadex at a lower dose  4.  H/o severe aortic stenosis  -status post porcine aortic valve replacement -TEE pending  5.  Acute kidney injury and chronic kidney disease 2 -resumed spironolactone and Demadex at lower dose -Creatinine improved   6.  Chronic atrial fibrillation -Stable, continue Eliquis  DVT prophylaxis: On Eliquis Code Status: Full code Family Communication: No family at bedside Disposition Plan: Home pending above work-up  Consultants:   Infectious disease Dr. Orvan Falconer   Procedures:   Antimicrobials:  Antibiotics Given (last 72 hours)    Date/Time Action  Medication Dose Rate   05/29/18 2004 New Bag/Given   penicillin G potassium 4 Million Units in dextrose 5 % 250 mL IVPB 4 Million Units 250 mL/hr   05/29/18 2316 New Bag/Given   penicillin G potassium 4 Million Units in dextrose 5 % 250 mL IVPB 4 Million Units 250 mL/hr   05/30/18 0645 New Bag/Given  [Loss of IV access]   penicillin G potassium 4 Million Units in dextrose 5 % 250 mL IVPB 4 Million Units 250 mL/hr   05/30/18 1112 New Bag/Given   penicillin G potassium 4 Million Units in dextrose 5 % 250 mL IVPB 4 Million Units 250 mL/hr   05/30/18 1541 New Bag/Given   penicillin G potassium 4 Million Units in dextrose 5 % 250 mL IVPB 4 Million Units 250 mL/hr   05/30/18 1849 New Bag/Given   penicillin G potassium 4 Million Units in dextrose 5 % 250 mL IVPB 4 Million Units 250 mL/hr   05/30/18 2301 New Bag/Given   penicillin G potassium 4 Million Units in dextrose 5 % 250 mL IVPB 4 Million Units 250 mL/hr   05/31/18 0318 New Bag/Given   penicillin G potassium 4 Million Units in dextrose 5 % 250 mL IVPB 4 Million Units 250 mL/hr   05/31/18 8119 New Bag/Given   penicillin G potassium 4 Million Units in dextrose 5 % 250 mL IVPB 4 Million Units 250 mL/hr   05/31/18 1123 New Bag/Given   penicillin G potassium 4 Million Units in dextrose 5 % 250 mL IVPB 4 Million Units 250 mL/hr   05/31/18 1545 New Bag/Given   penicillin G potassium 4 Million Units in dextrose 5 % 250 mL IVPB 4 Million Units 250 mL/hr   05/31/18 1913 New Bag/Given   penicillin G potassium 4 Million Units in dextrose 5 % 250  mL IVPB 4 Million Units 250 mL/hr   05/31/18 2337 New Bag/Given   penicillin G potassium 4 Million Units in dextrose 5 % 250 mL IVPB 4 Million Units 250 mL/hr   06/01/18 0206 New Bag/Given   penicillin G potassium 4 Million Units in dextrose 5 % 250 mL IVPB 4 Million Units 250 mL/hr   06/01/18 0600 New Bag/Given   penicillin G potassium 4 Million Units in dextrose 5 % 250 mL IVPB 4 Million Units 250 mL/hr     06/01/18 1056 New Bag/Given   penicillin G potassium 4 Million Units in dextrose 5 % 250 mL IVPB 4 Million Units 250 mL/hr      Subjective: -feels better, no distress  Objective: Vitals:   05/31/18 1319 05/31/18 2152 06/01/18 0555 06/01/18 1316  BP: 133/74 121/64 (!) 117/58 (!) 113/54  Pulse: 66 67 65 (!) 59  Resp: 18 18  (!) 24  Temp: 97.7 F (36.5 C) 97.8 F (36.6 C) 98 F (36.7 C) 97.7 F (36.5 C)  TempSrc:  Oral Oral Oral  SpO2: 100% 98% 99% 100%  Weight:      Height:        Intake/Output Summary (Last 24 hours) at 06/01/2018 1444 Last data filed at 06/01/2018 1059 Gross per 24 hour  Intake 3907.5 ml  Output 602 ml  Net 3305.5 ml   Filed Weights   05/29/18 0200 05/29/18 0934  Weight: 105.5 kg 103.9 kg    Examination:  Gen: Awake, Alert, Oriented X 3, obese, no distress HEENT: PERRLA, Neck supple, no JVD Lungs: Good air movement bilaterally, CTAB CVS: S1S2/RRR, systolic murmur Abd: soft, Non tender, non distended, BS present Extremities:  Hyperpigmentation bilaterally with large varicose veins, mild left thigh tenderness, improving Skin: As above Psychiatry: Judgement and insight appear normal. Mood & affect appropriate.     Data Reviewed:   CBC: Recent Labs  Lab 05/29/18 0204 05/31/18 0432 06/01/18 0257  WBC 10.0 10.2 8.1  NEUTROABS 8.5*  --   --   HGB 14.2 12.7 11.7*  HCT 45.9 40.1 36.1  MCV 99.8 99.8 99.2  PLT 196 155 162   Basic Metabolic Panel: Recent Labs  Lab 05/29/18 0204 05/29/18 0956 05/30/18 0341 05/31/18 0432 06/01/18 0257  NA 136 138 134* 131* 131*  K 4.1 3.9 4.4 4.4 4.8  CL 102 110 106 105 105  CO2 25 20* 22 21* 21*  GLUCOSE 99 134* 145* 138* 119*  BUN 26* 27* 33* 35* 25*  CREATININE 1.23* 1.18* 1.46* 1.20* 0.98  CALCIUM 9.8 8.8* 9.3 9.0 8.7*   GFR: Estimated Creatinine Clearance: 70.3 mL/min (by C-G formula based on SCr of 0.98 mg/dL). Liver Function Tests: Recent Labs  Lab 05/29/18 0204  AST 34  ALT 17   ALKPHOS 109  BILITOT 1.5*  PROT 8.0  ALBUMIN 4.1   No results for input(s): LIPASE, AMYLASE in the last 168 hours. No results for input(s): AMMONIA in the last 168 hours. Coagulation Profile: Recent Labs  Lab 05/29/18 0204  INR 1.56   Cardiac Enzymes: Recent Labs  Lab 05/29/18 0956  CKTOTAL 47   BNP (last 3 results) No results for input(s): PROBNP in the last 8760 hours. HbA1C: No results for input(s): HGBA1C in the last 72 hours. CBG: Recent Labs  Lab 05/29/18 0238  GLUCAP 95   Lipid Profile: No results for input(s): CHOL, HDL, LDLCALC, TRIG, CHOLHDL, LDLDIRECT in the last 72 hours. Thyroid Function Tests: No results for input(s): TSH, T4TOTAL, FREET4, T3FREE,  THYROIDAB in the last 72 hours. Anemia Panel: No results for input(s): VITAMINB12, FOLATE, FERRITIN, TIBC, IRON, RETICCTPCT in the last 72 hours. Urine analysis:    Component Value Date/Time   COLORURINE YELLOW 05/29/2018 0455   APPEARANCEUR CLEAR 05/29/2018 0455   LABSPEC 1.010 05/29/2018 0455   PHURINE 5.0 05/29/2018 0455   GLUCOSEU NEGATIVE 05/29/2018 0455   HGBUR NEGATIVE 05/29/2018 0455   BILIRUBINUR NEGATIVE 05/29/2018 0455   KETONESUR NEGATIVE 05/29/2018 0455   PROTEINUR NEGATIVE 05/29/2018 0455   UROBILINOGEN 0.2 06/22/2010 2055   NITRITE NEGATIVE 05/29/2018 0455   LEUKOCYTESUR SMALL (A) 05/29/2018 0455   Sepsis Labs: @LABRCNTIP (procalcitonin:4,lacticidven:4)  ) Recent Results (from the past 240 hour(s))  Culture, blood (Routine x 2)     Status: Abnormal   Collection Time: 05/29/18  1:50 AM  Result Value Ref Range Status   Specimen Description BLOOD RIGHT ANTECUBITAL  Final   Special Requests   Final    BOTTLES DRAWN AEROBIC AND ANAEROBIC Blood Culture adequate volume   Culture  Setup Time   Final    GRAM POSITIVE COCCI IN CHAINS BLOOD AEROBIC BOTTLE CRITICAL RESULT CALLED TO, READ BACK BY AND VERIFIED WITH: Regino Bellow D 1639 [id number] FCP Performed at [facility], [address], [place], [place] [address]    Culture GROUP B STREP(S.AGALACTIAE)ISOLATED (A)  Final   Report Status 05/31/2018 FINAL  Final   Organism ID, Bacteria GROUP B STREP(S.AGALACTIAE)ISOLATED  Final      Susceptibility   Group b strep(s.agalactiae)isolated - MIC*    CLINDAMYCIN <=0.25 SENSITIVE Sensitive     AMPICILLIN <=0.25 SENSITIVE Sensitive     ERYTHROMYCIN <=0.12 SENSITIVE Sensitive     VANCOMYCIN 0.5 SENSITIVE Sensitive     CEFTRIAXONE <=0.12 SENSITIVE Sensitive     LEVOFLOXACIN 1 SENSITIVE Sensitive     PENICILLIN Value in next row Sensitive      SENSITIVE<=0.06    * GROUP B STREP(S.AGALACTIAE)ISOLATED  Blood Culture ID Panel (Reflexed)     Status: Abnormal   Collection Time: 05/29/18  1:50 AM  Result Value Ref Range Status   Enterococcus species NOT DETECTED NOT DETECTED Final   Listeria monocytogenes NOT DETECTED NOT DETECTED Final   Staphylococcus species NOT DETECTED NOT DETECTED Final   Staphylococcus aureus (BCID) NOT DETECTED NOT DETECTED Final   Streptococcus species DETECTED (A) NOT DETECTED Final    Comment: CRITICAL RESULT CALLED TO, READ BACK BY AND VERIFIED WITH: PHARMD ERIN D 1639 [id number] FCP    Streptococcus agalactiae DETECTED (A) NOT DETECTED Final    Comment: CRITICAL RESULT CALLED TO, READ BACK BY AND VERIFIED WITH: PHARMD ERIN D 1639 [id number] FCP    Streptococcus pneumoniae NOT DETECTED NOT DETECTED Final   Streptococcus pyogenes NOT DETECTED NOT DETECTED Final   Acinetobacter baumannii NOT DETECTED NOT DETECTED Final   Enterobacteriaceae species NOT DETECTED NOT DETECTED Final   Enterobacter cloacae complex NOT DETECTED NOT DETECTED Final   Escherichia coli NOT DETECTED NOT DETECTED Final   Klebsiella oxytoca NOT DETECTED NOT DETECTED Final   Klebsiella pneumoniae NOT DETECTED NOT DETECTED Final   Proteus species NOT DETECTED NOT DETECTED Final   Serratia marcescens NOT DETECTED NOT DETECTED Final   Haemophilus influenzae NOT DETECTED NOT DETECTED Final     Neisseria meningitidis NOT DETECTED NOT DETECTED Final   Pseudomonas aeruginosa NOT DETECTED NOT DETECTED Final   Candida albicans NOT DETECTED NOT DETECTED Final   Candida glabrata NOT DETECTED NOT DETECTED Final   Candida krusei NOT DETECTED  NOT DETECTED Final   Candida parapsilosis NOT DETECTED NOT DETECTED Final   Candida tropicalis NOT DETECTED NOT DETECTED Final    Comment: Performed at [facility], [address], [place], [place] [address]  Culture, blood (Routine x 2)     Status: Abnormal   Collection Time: 05/29/18  2:00 AM  Result Value Ref Range Status   Specimen Description BLOOD LEFT ANTECUBITAL  Final   Special Requests   Final    BOTTLES DRAWN AEROBIC AND ANAEROBIC Blood Culture adequate volume   Culture  Setup Time   Final    GRAM POSITIVE COCCI IN CHAINS IN BOTH AEROBIC AND ANAEROBIC BOTTLES CRITICAL VALUE NOTED.  VALUE IS CONSISTENT WITH PREVIOUSLY REPORTED AND CALLED VALUE.    Culture (A)  Final    GROUP B STREP(S.AGALACTIAE)ISOLATED SUSCEPTIBILITIES PERFORMED ON PREVIOUS CULTURE WITHIN THE LAST 5 DAYS. Performed at [facility], [address], [place], [place] [address]    Report Status 05/31/2018 FINAL  Final  Culture, blood (routine x 2)     Status: None (Preliminary result)   Collection Time: 05/30/18 10:54 AM  Result Value Ref Range Status   Specimen Description BLOOD RIGHT HAND  Final   Special Requests   Final    BOTTLES DRAWN AEROBIC ONLY Blood Culture adequate volume   Culture   Final    NO GROWTH 1 DAY Performed at [facility], [address], [place], [place] [address]    Report Status PENDING  Incomplete  Culture, blood (routine x 2)     Status: None (Preliminary result)   Collection Time: 05/30/18 11:03 AM  Result Value Ref Range Status   Specimen Description BLOOD LEFT HAND  Final   Special Requests   Final    BOTTLES DRAWN AEROBIC ONLY Blood Culture adequate volume   Culture   Final    NO GROWTH 1 DAY Performed at  [facility], [address] [place], [place] [address]    Report Status PENDING  Incomplete         Radiology Studies: No results found.      Scheduled Meds: . allopurinol  100 mg Oral Daily  . apixaban  2.5 mg Oral BID  . aspirin EC  81 mg Oral Daily  . [START ON 06/16/2018] cyanocobalamin  1,000 mcg Intramuscular Q30 days  . ferrous sulfate  325 mg Oral BID WC  . fluticasone  1 spray Each Nare Daily  . levothyroxine  175 mcg Oral QAC breakfast  . magnesium oxide  400 mg Oral Daily  . midodrine  2.5 mg Oral TID WC  . pantoprazole  40 mg Oral Daily  . spironolactone  25 mg Oral Daily  . torsemide  20 mg Oral BID   Continuous Infusions: . pencillin G potassium IV 4 Million Units (06/01/18 1056)     LOS: 3 days    Time spent:    Zannie Cove, MD [facility] Page via [email or web address], password [email or web address] After 7PM please contact night-coverage  06/01/2018, 2:44 PM

## 2018-06-01 NOTE — Progress Notes (Signed)
Pt was washing up at sink with NT, and nose began bleeding. Pt is  Currently pinching nose with wet washcloth. Bleeding has slowed down. Pt denies any feelings of dizziness. MD made aware. Will continue to monitor.

## 2018-06-01 NOTE — Progress Notes (Signed)
Patient seen and examined personally and discussed with the medical student.  Waiting for TEE to determine further intervention and treatments which is planned for tomorrow.  She will need prolonged IV antibiotics with penicillin regardless.      [facility]  Date of Admission:  05/29/2018   Total days of antibiotics 4          ASSESSMENT: Christine Hicks a 62 y.o.femalewith history of aortic stenosis status post aortic valve replacement (porcine), admitted for streptococcus agalactiae bacteremia. TTE without obvious vegetation. Repeat cultures 11/3 NGTD at 1 day. On penicillin currently, remains afebrile and leukocytosis. Will need IV antibiotics at home.  Will order picc after TEE and when blood cultures show clearance at 48-72 hours. Remains afebrile w/o leukocytosis.    PLAN: 1. Await TEE 2. 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 allopurinol  100 mg Oral Daily  . apixaban  2.5 mg Oral BID  . aspirin EC  81 mg Oral Daily  . [START ON 06/16/2018] cyanocobalamin  1,000 mcg Intramuscular Q30 days  . ferrous sulfate  325 mg Oral BID WC  . fluticasone  1 spray Each Nare Daily  . levothyroxine  175 mcg Oral QAC breakfast  . magnesium oxide  400 mg Oral Daily  . midodrine  2.5 mg Oral TID WC  . pantoprazole  40 mg Oral Daily  . spironolactone  25 mg Oral Daily  . torsemide  20 mg Oral BID   Continuous Infusions: . pencillin G potassium IV 4 Million Units (06/01/18 0600)   PRN Meds:.HYDROcodone-acetaminophen, ondansetron **OR** ondansetron (ZOFRAN) IV, polyethylene glycol   SUBJECTIVE: Reports she feels great, denies n/v/d or rashes. No fevers/chills. Eating well.   Review of Systems: Review of Systems  Constitutional: Negative for chills and  fever.  Respiratory: Negative for shortness of breath and wheezing.   Cardiovascular: Positive for leg swelling. Negative for chest pain.  Skin: Negative for rash.    Allergies  Allergen Reactions  . No Known Allergies     OBJECTIVE: Vitals:   05/31/18 0954 05/31/18 1319 05/31/18 2152 06/01/18 0555  BP: 127/69 133/74 121/64 (!) 117/58  Pulse: 72 66 67 65  Resp: 20 18 18    Temp:  97.7 F (36.5 C) 97.8 F (36.6 C) 98 F (36.7 C)  TempSrc:   Oral Oral  SpO2: 100% 100% 98% 99%  Weight:      Height:       Body mass index is 38.12 kg/m.  Physical Exam  Constitutional: She is oriented to person, place, and time. She appears well-developed and well-nourished.  HENT:  Head: Normocephalic.  Eyes: EOM are normal.  Neck: Normal range of motion.  Cardiovascular: Normal rate and regular rhythm.  Pulmonary/Chest: Effort normal and breath sounds normal. No respiratory distress.  Neurological: She is alert and oriented to person, place, and time.  Skin: Skin is warm and dry.  Hyperpigmentation in bilateral lower extremities, chronic varicose veins, 1+ nonpitting edema R>L.     Lab Results Lab Results  Component Value Date   WBC 8.1 06/01/2018   HGB 11.7 (L) 06/01/2018   HCT 36.1 06/01/2018   MCV 99.2 06/01/2018   PLT 162 06/01/2018    Lab Results  Component Value Date   CREATININE 0.98  06/01/2018   BUN 25 (H) 06/01/2018   NA 131 (L) 06/01/2018   K 4.8 06/01/2018   CL 105 06/01/2018   CO2 21 (L) 06/01/2018    Lab Results  Component Value Date   ALT 17 05/29/2018   AST 34 05/29/2018   ALKPHOS 109 05/29/2018   BILITOT 1.5 (H) 05/29/2018     Microbiology: Recent Results (from the past 240 hour(s))  Culture, blood (Routine x 2)     Status: Abnormal   Collection Time: 05/29/18  1:50 AM  Result Value Ref Range Status   Specimen Description BLOOD RIGHT ANTECUBITAL  Final   Special Requests   Final    BOTTLES DRAWN AEROBIC AND ANAEROBIC Blood Culture adequate volume     Culture  Setup Time   Final    GRAM POSITIVE COCCI IN CHAINS BLOOD AEROBIC BOTTLE CRITICAL RESULT CALLED TO, READ BACK BY AND VERIFIED WITH: PHARMD ERIN D 1639 [id number] FCP Performed at [facility], [address], [place], [place] [address]    Culture GROUP B STREP(S.AGALACTIAE)ISOLATED (A)  Final   Report Status 05/31/2018 FINAL  Final   Organism ID, Bacteria GROUP B STREP(S.AGALACTIAE)ISOLATED  Final      Susceptibility   Group b strep(s.agalactiae)isolated - MIC*    CLINDAMYCIN <=0.25 SENSITIVE Sensitive     AMPICILLIN <=0.25 SENSITIVE Sensitive     ERYTHROMYCIN <=0.12 SENSITIVE Sensitive     VANCOMYCIN 0.5 SENSITIVE Sensitive     CEFTRIAXONE <=0.12 SENSITIVE Sensitive     LEVOFLOXACIN 1 SENSITIVE Sensitive     PENICILLIN Value in next row Sensitive      SENSITIVE<=0.06    * GROUP B STREP(S.AGALACTIAE)ISOLATED  Blood Culture ID Panel (Reflexed)     Status: Abnormal   Collection Time: 05/29/18  1:50 AM  Result Value Ref Range Status   Enterococcus species NOT DETECTED NOT DETECTED Final   Listeria monocytogenes NOT DETECTED NOT DETECTED Final   Staphylococcus species NOT DETECTED NOT DETECTED Final   Staphylococcus aureus (BCID) NOT DETECTED NOT DETECTED Final   Streptococcus species DETECTED (A) NOT DETECTED Final    Comment: CRITICAL RESULT CALLED TO, READ BACK BY AND VERIFIED WITH: PHARMD ERIN D 1639 [id number] FCP    Streptococcus agalactiae DETECTED (A) NOT DETECTED Final    Comment: CRITICAL RESULT CALLED TO, READ BACK BY AND VERIFIED WITH: PHARMD ERIN D 1639 [id number] FCP    Streptococcus pneumoniae NOT DETECTED NOT DETECTED Final   Streptococcus pyogenes NOT DETECTED NOT DETECTED Final   Acinetobacter baumannii NOT DETECTED NOT DETECTED Final   Enterobacteriaceae species NOT DETECTED NOT DETECTED Final   Enterobacter cloacae complex NOT DETECTED NOT DETECTED Final   Escherichia coli NOT DETECTED NOT DETECTED Final   Klebsiella oxytoca NOT DETECTED NOT DETECTED  Final   Klebsiella pneumoniae NOT DETECTED NOT DETECTED Final   Proteus species NOT DETECTED NOT DETECTED Final   Serratia marcescens NOT DETECTED NOT DETECTED Final   Haemophilus influenzae NOT DETECTED NOT DETECTED Final   Neisseria meningitidis NOT DETECTED NOT DETECTED Final   Pseudomonas aeruginosa NOT DETECTED NOT DETECTED Final   Candida albicans NOT DETECTED NOT DETECTED Final   Candida glabrata NOT DETECTED NOT DETECTED Final   Candida krusei NOT DETECTED NOT DETECTED Final   Candida parapsilosis NOT DETECTED NOT DETECTED Final   Candida tropicalis NOT DETECTED NOT DETECTED Final    Comment: Performed at [facility], [address] [place], [place] [address]  Culture, blood (Routine x 2)  Status: Abnormal   Collection Time: 05/29/18  2:00 AM  Result Value Ref Range Status   Specimen Description BLOOD LEFT ANTECUBITAL  Final   Special Requests   Final    BOTTLES DRAWN AEROBIC AND ANAEROBIC Blood Culture adequate volume   Culture  Setup Time   Final    GRAM POSITIVE COCCI IN CHAINS IN BOTH AEROBIC AND ANAEROBIC BOTTLES CRITICAL VALUE NOTED.  VALUE IS CONSISTENT WITH PREVIOUSLY REPORTED AND CALLED VALUE.    Culture (A)  Final    GROUP B STREP(S.AGALACTIAE)ISOLATED SUSCEPTIBILITIES PERFORMED ON PREVIOUS CULTURE WITHIN THE LAST 5 DAYS. Performed at [facility], [address], [place], [place] [address]    Report Status 05/31/2018 FINAL  Final  Culture, blood (routine x 2)     Status: None (Preliminary result)   Collection Time: 05/30/18 10:54 AM  Result Value Ref Range Status   Specimen Description BLOOD RIGHT HAND  Final   Special Requests   Final    BOTTLES DRAWN AEROBIC ONLY Blood Culture adequate volume   Culture   Final    NO GROWTH 1 DAY Performed at [facility], [address], [place], [place] [address]    Report Status PENDING  Incomplete  Culture, blood (routine x 2)     Status: None (Preliminary result)   Collection Time: 05/30/18  11:03 AM  Result Value Ref Range Status   Specimen Description BLOOD LEFT HAND  Final   Special Requests   Final    BOTTLES DRAWN AEROBIC ONLY Blood Culture adequate volume   Culture   Final    NO GROWTH 1 DAY Performed at [facility], [address], [place], [place] [address]    Report Status PENDING  Incomplete    April  A Peterson, MD [facility]  [facility] [phone] pager   [phone] cell 06/01/2018, 9:27 AM

## 2018-06-01 NOTE — Evaluation (Signed)
Occupational Therapy Evaluation Patient Details Name: Christine Hicks MRN: [id number] DOB: 1955/08/19 Today's Date: 06/01/2018    History of Present Illness Christine Hicks is a 62yo female who comes to [facility] obn 11/2 c fever, chills, myalgia. PMH: CHF, falls, morbid obestiy, HTN, DVT, GERD, CKD2, aortic stenosis, AVR.    Clinical Impression   This 62 y/o female presents with the above. At baseline pt is independent-mod independent with ADLs and functional mobility occasionally using SPC/rollator. Pt presents sitting up in recliner pleasant and willing to participate in therapy session. Pt completing room level mobility, toileting and standing grooming ADL without AD and overall minguard assist throughout. She currently requires minguard-minA for LB ADLs. Pt reports just recently completing working with [facility] therapy services (PT), reports she lives with spouse and son who are able to assist with ADLs/iADLs PRN after return home. Pt will benefit from continued OT services while she remains in acute setting to maximize her overall safety and independence with ADLs and mobility. Do not anticipate pt will require follow up OT services. Will follow.     Follow Up Recommendations  No OT follow up;Supervision - Intermittent    Equipment Recommendations  None recommended by OT           Precautions / Restrictions Precautions Precautions: Fall Restrictions Weight Bearing Restrictions: No      Mobility Bed Mobility               General bed mobility comments: received up in chair   Transfers Overall transfer level: Modified independent Equipment used: None             General transfer comment: no physical assist required, stood from recliner and toilet     Balance Overall balance assessment: No apparent balance deficits (not formally assessed)                                         ADL either performed or assessed with clinical judgement   ADL Overall ADL's :  Needs assistance/impaired Eating/Feeding: Independent;Sitting   Grooming: Supervision/safety;Min guard;Standing;Wash/dry hands   Upper Body Bathing: Supervision/ safety;Sitting   Lower Body Bathing: Min guard;Sit to/from stand   Upper Body Dressing : Set up;Modified independent;Sitting   Lower Body Dressing: Min guard;Minimal assistance;Sit to/from stand   Toilet Transfer: Min guard;Ambulation;Regular Teacher, adult education Details (indicate cue type and reason): close minguard for safety; pt able to stand from regular toilet in room without UE assist Toileting- Clothing Manipulation and Hygiene: Min guard;Sitting/lateral lean Toileting - Clothing Manipulation Details (indicate cue type and reason): performing peri-care via lateral leans after voiding bladder     Functional mobility during ADLs: Min guard;Minimal assistance General ADL Comments: overall close minguard without AD in room ambulating; pt very motivated to maintain CLOF     Vision         Perception     Praxis      Pertinent Vitals/Pain Pain Assessment: No/denies pain     Hand Dominance     Extremity/Trunk Assessment Upper Extremity Assessment Upper Extremity Assessment: Overall WFL for tasks assessed   Lower Extremity Assessment Lower Extremity Assessment: Defer to PT evaluation   Cervical / Trunk Assessment Cervical / Trunk Assessment: Normal   Communication Communication Communication: No difficulties   Cognition Arousal/Alertness: Awake/alert Behavior During Therapy: WFL for tasks assessed/performed Overall Cognitive Status: Within Functional Limits for tasks assessed  General Comments       Exercises     Shoulder Instructions      Home Living Family/patient expects to be discharged to:: Private residence Living Arrangements: Spouse/significant other;Children Available Help at Discharge: Family;Available 24 hours/day Type of Home:  House Home Access: Level entry     Home Layout: One level     Bathroom Shower/Tub: Chief Strategy Officer: Standard     Home Equipment: Environmental consultant - 2 wheels;Cane - single point;Bedside commode;Tub bench;Grab bars - tub/shower;Walker - 4 wheels   Additional Comments: Patient reports RW is very old and unstable      Prior Functioning/Environment Level of Independence: Needs assistance  Gait / Transfers Assistance Needed: pt reports intermittently using rollator/SPC for mobility PRN ADL's / Homemaking Assistance Needed: no assist required   Comments: pt reports recently completed working with HHPT        OT Problem List: Decreased strength;Decreased range of motion;Decreased activity tolerance      OT Treatment/Interventions: Self-care/ADL training;Energy conservation;Therapeutic exercise;Therapeutic activities;Patient/family education;Balance training    OT Goals(Current goals can be found in the care plan section) Acute Rehab OT Goals Patient Stated Goal: return to home, continue to progress mobility independently  OT Goal Formulation: With patient Time For Goal Achievement: 06/15/18 Potential to Achieve Goals: Good  OT Frequency: Min 2X/week   Barriers to D/C:            Co-evaluation              AM-PAC PT "6 Clicks" Daily Activity     Outcome Measure Help from another person eating meals?: None Help from another person taking care of personal grooming?: None Help from another person toileting, which includes using toliet, bedpan, or urinal?: None Help from another person bathing (including washing, rinsing, drying)?: A Little Help from another person to put on and taking off regular upper body clothing?: None Help from another person to put on and taking off regular lower body clothing?: A Little 6 Click Score: 22   End of Session Equipment Utilized During Treatment: Gait belt Nurse Communication: Mobility status  Activity Tolerance: Patient  tolerated treatment well Patient left: in chair;with call bell/phone within reach  OT Visit Diagnosis: Muscle weakness (generalized) (M62.81)                Time: 5409-8119 OT Time Calculation (min): 18 min Charges:  OT General Charges $OT Visit: 1 Visit OT Evaluation $OT Eval Low Complexity: 1 Low  Marcy Siren, OT Supplemental Rehabilitation Services Pager [phone] Office [phone]   Orlando Penner 06/01/2018, 1:11 PM

## 2018-06-01 NOTE — Care Management Note (Signed)
Case Management Note  Patient Details  Name: JAZALYN MONDOR MRN: [id number] Date of Birth: 08/21/1955  Subjective/Objective:       Streptococcus agalactiae bacteremia. From home with husband/ sons.            Audie Pinto. [address]) Deboraha Sprang III (Son)    [phone]     PCP: Leida Lauth  Action/Plan: TEE tomorrow 11/6....  Per ID pt will need home IV ABX therapy.Marland KitchenPICC line to be placed  afterTEE and  blood cultures show clearance at 48-72 hours.  [facility] following for home health services and IV ABX therapy. OPAT consult will be needed from MD and home health RN order.   Expected Discharge Date:                  Expected Discharge Plan:  Home w Home Health Services  In-House Referral:     Discharge planning Services  CM Consult  Post Acute Care Choice:    Choice offered to:  Patient  DME Arranged:   IV ABX THERAPY DME Agency:  [facility]  HH Arranged:  RN [facility] Agency:  [facility]  Status of Service:  In process, will continue to follow  If discussed at Long Length of Stay Meetings, dates discussed:    Additional Comments:  Epifanio Lesches, RN 06/01/2018, 4:18 PM

## 2018-06-01 NOTE — Progress Notes (Signed)
    [facility] has been requested to perform a transesophageal echocardiogram on Christine Hicks for Bacteremia.   After careful review of history and examination, the risks and benefits of transesophageal echocardiogram have been explained including risks of esophageal damage, perforation (1:10,000 risk), bleeding, pharyngeal hematoma as well as other potential complications associated with conscious sedation including aspiration, arrhythmia, respiratory failure and death. Alternatives to treatment were discussed, questions were answered. Patient is willing to proceed.  TEE - Dr. Anne Fu 06/02/18 @ noon. NPO after midnight. Meds with sips.   Manson Passey, PA-C 06/01/2018 11:58 AM

## 2018-06-01 NOTE — Evaluation (Signed)
Physical Therapy Evaluation Patient Details Name: Christine Hicks MRN: [id number] DOB: 10-28-1955 Today's Date: 06/01/2018   History of Present Illness  Christine Hicks is a 62yo female who comes to [facility] obn 11/2 c fever, chills, myalgia. PMH: CHF, falls, morbid obestiy, HTN, DVT, GERD, CKD2, aortic stenosis, AVR.   Clinical Impression  Pt admitted with above diagnosis. Pt currently with functional limitations due to the deficits listed below (see "PT Problem List"). Upon entry, pt in chair, no family/caregiver present. The pt is awake and agreeable to participate.  The pt is alert and oriented x3, pleasant, conversational, and following simple commands consistently. Pt reports near resolution of thigh pain found upon admission. Functional mobility assessment demonstrates increased effort/time requirements, minimally limited tolerance, but no frank need for physical assistance, whereas the patient performed these at a similar level of independence PTA. Pt reports DC from HHPT a few weeks back and good compliance overall with physical activity since. Pt will benefit from skilled PT intervention to improve independence and safety with basic mobility in preparation for discharge to the venue listed below. No PT needs anticipated s/p DC.      Follow Up Recommendations No PT follow up    Equipment Recommendations  None recommended by PT    Recommendations for Other Services       Precautions / Restrictions Precautions Precautions: Fall      Mobility  Bed Mobility               General bed mobility comments: received up in chair   Transfers Overall transfer level: Modified independent Equipment used: None             General transfer comment: 5xSTS: 23.19s hands free  Ambulation/Gait Ambulation/Gait assistance: Modified independent (Device/Increase time) Gait Distance (Feet): 200 Feet(122ft in room, then elects to 22ft in hall for therapeutic activity) Assistive device:  None(PT managing IV pole for pt)   Gait velocity: 0.2m/s, pt reports to be near baseline       Stairs            Wheelchair Mobility    Modified Rankin (Stroke Patients Only)       Balance Overall balance assessment: Modified Independent                                           Pertinent Vitals/Pain Pain Assessment: No/denies pain    Home Living Family/patient expects to be discharged to:: Private residence Living Arrangements: Spouse/significant other;Children Available Help at Discharge: Family;Available 24 hours/day Type of Home: House Home Access: Level entry     Home Layout: One level Home Equipment: Walker - 2 wheels;Cane - single point;Bedside commode;Shower seat Additional Comments: Patient reports RW is very old and unstable    Prior Function Level of Independence: Needs assistance               Hand Dominance        Extremity/Trunk Assessment   Upper Extremity Assessment Upper Extremity Assessment: Overall WFL for tasks assessed    Lower Extremity Assessment Lower Extremity Assessment: Overall WFL for tasks assessed    Cervical / Trunk Assessment Cervical / Trunk Assessment: Normal  Communication   Communication: No difficulties  Cognition Arousal/Alertness: Awake/alert Behavior During Therapy: WFL for tasks assessed/performed Overall Cognitive Status: Within Functional Limits for tasks assessed  General Comments      Exercises     Assessment/Plan    PT Assessment Patient needs continued PT services  PT Problem List Decreased strength;Decreased mobility       PT Treatment Interventions Functional mobility training;Therapeutic activities;Therapeutic exercise;Patient/family education    PT Goals (Current goals can be found in the Care Plan section)  Acute Rehab PT Goals Patient Stated Goal: return to home, continue to progress mobility  independently  PT Goal Formulation: With patient Time For Goal Achievement: 06/15/18 Potential to Achieve Goals: Good    Frequency Min 3X/week   Barriers to discharge        Co-evaluation               AM-PAC PT "6 Clicks" Daily Activity  Outcome Measure Difficulty turning over in bed (including adjusting bedclothes, sheets and blankets)?: A Little Difficulty moving from lying on back to sitting on the side of the bed? : A Little Difficulty sitting down on and standing up from a chair with arms (e.g., wheelchair, bedside commode, etc,.)?: A Little Help needed moving to and from a bed to chair (including a wheelchair)?: A Little Help needed walking in hospital room?: A Little Help needed climbing 3-5 steps with a railing? : A Lot 6 Click Score: 17    End of Session Equipment Utilized During Treatment: Gait belt Activity Tolerance: Patient tolerated treatment well;Patient limited by fatigue Patient left: in chair;with call bell/phone within reach;Other (comment)(Dr. Jomarie Longs in room) Nurse Communication: Mobility status PT Visit Diagnosis: Unsteadiness on feet (R26.81);Difficulty in walking, not elsewhere classified (R26.2)    Time: 1610-9604 PT Time Calculation (min) (ACUTE ONLY): 17 min   Charges:   PT Evaluation $PT Eval Moderate Complexity: 1 Mod PT Treatments $Therapeutic Activity: 8-22 mins        12:44 PM, 06/01/18 Rosamaria Lints, PT, DPT Physical Therapist - [place] [phone] (Pager)  [phone] (Office)     Buccola,Allan C 06/01/2018, 12:41 PM

## 2018-06-02 ENCOUNTER — Encounter (HOSPITAL_COMMUNITY)
Admission: EM | Disposition: A | Discharge status: Home / Self Care | Payer: Self-pay | Source: Home / Self Care | Attending: Internal Medicine

## 2018-06-02 ENCOUNTER — Inpatient Hospital Stay (HOSPITAL_COMMUNITY): Payer: Medicare Other

## 2018-06-02 ENCOUNTER — Encounter (HOSPITAL_COMMUNITY): Payer: Self-pay

## 2018-06-02 ENCOUNTER — Inpatient Hospital Stay: Payer: Self-pay

## 2018-06-02 DIAGNOSIS — I517 Cardiomegaly: Secondary | ICD-10-CM

## 2018-06-02 DIAGNOSIS — R7881 Bacteremia: Secondary | ICD-10-CM

## 2018-06-02 DIAGNOSIS — Z953 Presence of xenogenic heart valve: Secondary | ICD-10-CM

## 2018-06-02 HISTORY — PX: TEE WITHOUT CARDIOVERSION: SHX5443

## 2018-06-02 LAB — BASIC METABOLIC PANEL
Anion gap: 5 (ref 5–15)
BUN: 20 mg/dL (ref 8–23)
CHLORIDE: 103 mmol/L (ref 98–111)
CO2: 26 mmol/L (ref 22–32)
Calcium: 8.7 mg/dL — ABNORMAL LOW (ref 8.9–10.3)
Creatinine, Ser: 0.94 mg/dL (ref 0.44–1.00)
GFR calc Af Amer: >60 mL/min (ref >60)
Glucose, Bld: 113 mg/dL — ABNORMAL HIGH (ref 70–99)
POTASSIUM: 4.1 mmol/L (ref 3.5–5.1)
SODIUM: 134 mmol/L — AB (ref 135–145)

## 2018-06-02 LAB — CBC
HCT: 36.7 % (ref 36.0–46.0)
HEMOGLOBIN: 11.5 g/dL — AB (ref 12.0–15.0)
MCH: 30.9 pg (ref 26.0–34.0)
MCHC: 31.3 g/dL (ref 30.0–36.0)
MCV: 98.7 fL (ref 80.0–100.0)
NRBC: 0 % (ref 0.0–0.2)
PLATELETS: 164 10*3/uL (ref 150–400)
RBC: 3.72 MIL/uL — AB (ref 3.87–5.11)
RDW: 14.6 % (ref 11.5–15.5)
WBC: 6.8 10*3/uL (ref 4.0–10.5)

## 2018-06-02 SURGERY — ECHOCARDIOGRAM, TRANSESOPHAGEAL
Anesthesia: Moderate Sedation

## 2018-06-02 MED ORDER — SODIUM CHLORIDE 0.9% FLUSH: 10.0000 mL | INTRAVENOUS | Status: DC | PRN

## 2018-06-02 MED ORDER — FENTANYL CITRATE (PF) 100 MCG/2ML IJ SOLN
INTRAMUSCULAR | Status: DC | PRN
  Administered 2018-06-02 (×3): 25 ug via INTRAVENOUS

## 2018-06-02 MED ORDER — TORSEMIDE 20 MG PO TABS
40.0000 mg | ORAL_TABLET | Freq: Two times a day (BID) | ORAL | Status: DC
  Administered 2018-06-02 – 2018-06-03 (×2): 40 mg via ORAL
  Filled 2018-06-02 (×2): qty 2

## 2018-06-02 MED ORDER — BUTAMBEN-TETRACAINE-BENZOCAINE 2-2-14 % EX AERO
INHALATION_SPRAY | CUTANEOUS | Status: DC | PRN
  Administered 2018-06-02: 2 via TOPICAL

## 2018-06-02 MED ORDER — PNEUMOCOCCAL VAC POLYVALENT 25 MCG/0.5ML IJ INJ
0.5000 mL | INJECTION | INTRAMUSCULAR | Status: AC
  Administered 2018-06-03: 0.5 mL via INTRAMUSCULAR
  Filled 2018-06-02: qty 0.5

## 2018-06-02 MED ORDER — SODIUM CHLORIDE 0.9 % IV SOLN
INTRAVENOUS | Status: AC | PRN
  Administered 2018-06-02: 500 mL via INTRAVENOUS

## 2018-06-02 MED ORDER — MIDAZOLAM HCL (PF) 5 MG/ML IJ SOLN
INTRAMUSCULAR | Status: AC
  Filled 2018-06-02: qty 2

## 2018-06-02 MED ORDER — FENTANYL CITRATE (PF) 100 MCG/2ML IJ SOLN
INTRAMUSCULAR | Status: AC
  Filled 2018-06-02: qty 2

## 2018-06-02 MED ORDER — MIDAZOLAM HCL 10 MG/2ML IJ SOLN
INTRAMUSCULAR | Status: DC | PRN
  Administered 2018-06-02: 2 mg via INTRAVENOUS
  Administered 2018-06-02: 1 mg via INTRAVENOUS
  Administered 2018-06-02: 2 mg via INTRAVENOUS

## 2018-06-02 MED ORDER — BISACODYL 10 MG RE SUPP
10.0000 mg | Freq: Once | RECTAL | Status: AC
  Administered 2018-06-02: 10 mg via RECTAL
  Filled 2018-06-02: qty 1

## 2018-06-02 NOTE — Interval H&P Note (Signed)
History and Physical Interval Note:  06/02/2018 11:33 AM  Christine Hicks  has presented today for surgery, with the diagnosis of STROKE  The various methods of treatment have been discussed with the patient and family. After consideration of risks, benefits and other options for treatment, the patient has consented to  Procedure(s): TRANSESOPHAGEAL ECHOCARDIOGRAM (TEE) (N/A) as a surgical intervention .  The patient's history has been reviewed, patient examined, no change in status, stable for surgery.  I have reviewed the patient's chart and labs.  Questions were answered to the patient's satisfaction.     [employer]

## 2018-06-02 NOTE — Progress Notes (Addendum)
[facility]  Date of Admission:  05/29/2018   Total days of antibiotics 5         ASSESSMENT: Christine Hicks a 62 y.o.femalewith history of aortic stenosis status post aortic valve replacement (porcine), admitted for streptococcus agalactiae bacteremia. TEE without obvious vegetation. Repeat cultures 11/3 NGTD at 3 day. On penicillin currently, remains afebrile without leukocytosis.  Will need IV antibiotics at home.  Will need monitoring per home health, OPAT consult Has bacteremia from cellulitis; no sepsis on admission.   PLAN: 1. PICC line - Done 2. Continue IV abx for 4 weeks through November 30th 3. Will arrange follow up in our clinic in 2-3 weeks.    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 [MAR Hold] allopurinol  100 mg Oral Daily  . [MAR Hold] apixaban  2.5 mg Oral BID  . [MAR Hold] aspirin EC  81 mg Oral Daily  . bisacodyl  10 mg Rectal Once  . [MAR Hold] cyanocobalamin  1,000 mcg Intramuscular Q30 days  . [MAR Hold] ferrous sulfate  325 mg Oral BID WC  . [MAR Hold] fluticasone  1 spray Each Nare Daily  . [MAR Hold] Influenza vac split quadrivalent PF  0.5 mL Intramuscular Tomorrow-1000  . [MAR Hold] levothyroxine  175 mcg Oral QAC breakfast  . [MAR Hold] magnesium oxide  400 mg Oral Daily  . [MAR Hold] midodrine  2.5 mg Oral TID WC  . [MAR Hold] pantoprazole  40 mg Oral Daily  . [MAR Hold] spironolactone  25 mg Oral Daily  . [MAR Hold] torsemide  40 mg Oral BID   Continuous Infusions: . [MAR Hold] pencillin G potassium IV 4 Million Units (06/02/18 0640)   PRN Meds:.[MAR Hold] HYDROcodone-acetaminophen, [MAR Hold] ondansetron **OR** [MAR Hold] ondansetron (ZOFRAN) IV, [MAR Hold] polyethylene glycol   SUBJECTIVE: No complaints, no associated  n/v, no rashes, no diarrhea.  Afebrile, WBC wnl.    Review of Systems: Review of Systems  Constitutional: Negative for chills, fever and malaise/fatigue.  Cardiovascular: Positive for leg swelling. Negative for chest pain.  Gastrointestinal: Positive for constipation. Negative for abdominal pain, heartburn, nausea and vomiting.  Skin: Negative for itching and rash.    Allergies  Allergen Reactions  . No Known Allergies     OBJECTIVE: Vitals:   06/02/18 1145 06/02/18 1150 06/02/18 1155 06/02/18 1200  BP: (!) 145/49 (!) 107/58 (!) 101/37 122/66  Pulse: 96 95 94 (!) 108  Resp: (!) 25 17 20  (!) 21  Temp:      TempSrc:      SpO2: 99% 100% 100% 100%  Weight:      Height:       Body mass index is 38.12 kg/m.  Physical Exam  Constitutional: She is oriented to person, place, and time. She appears well-developed and well-nourished.  HENT:  Head: Normocephalic and atraumatic.  Eyes: EOM are normal.  Neck: Normal range of motion.  Cardiovascular: Normal rate and regular rhythm.  Pulmonary/Chest: Effort normal and breath sounds normal. No respiratory distress.  Abdominal: Soft. Bowel sounds are normal.  Musculoskeletal: Normal range of motion.  Neurological: She is alert and oriented to person, place, and time.  Skin: Skin is warm and dry.  Chronic varicose veins bilaterally, R>L lower extremity swelling, chronic  Psychiatric: She has a normal mood and affect.    Lab Results Lab Results  Component Value Date   WBC 6.8 06/02/2018   HGB 11.5 (L) 06/02/2018   HCT 36.7 06/02/2018   MCV 98.7 06/02/2018   PLT 164 06/02/2018    Lab Results  Component Value Date   CREATININE 0.94 06/02/2018   BUN 20 06/02/2018   NA 134 (L) 06/02/2018   K 4.1 06/02/2018   CL 103 06/02/2018   CO2 26 06/02/2018    Lab Results  Component Value Date   ALT 17 05/29/2018   AST 34 05/29/2018   ALKPHOS 109 05/29/2018   BILITOT 1.5 (H) 05/29/2018     Microbiology: Recent Results (from the  past 240 hour(s))  Culture, blood (Routine x 2)     Status: Abnormal   Collection Time: 05/29/18  1:50 AM  Result Value Ref Range Status   Specimen Description BLOOD RIGHT ANTECUBITAL  Final   Special Requests   Final    BOTTLES DRAWN AEROBIC AND ANAEROBIC Blood Culture adequate volume   Culture  Setup Time   Final    GRAM POSITIVE COCCI IN CHAINS BLOOD AEROBIC BOTTLE CRITICAL RESULT CALLED TO, READ BACK BY AND VERIFIED WITH: PHARMD ERIN D 1639 [id number] FCP Performed at [facility], [address], [place], [place] [address]    Culture GROUP B STREP(S.AGALACTIAE)ISOLATED (A)  Final   Report Status 05/31/2018 FINAL  Final   Organism ID, Bacteria GROUP B STREP(S.AGALACTIAE)ISOLATED  Final      Susceptibility   Group b strep(s.agalactiae)isolated - MIC*    CLINDAMYCIN <=0.25 SENSITIVE Sensitive     AMPICILLIN <=0.25 SENSITIVE Sensitive     ERYTHROMYCIN <=0.12 SENSITIVE Sensitive     VANCOMYCIN 0.5 SENSITIVE Sensitive     CEFTRIAXONE <=0.12 SENSITIVE Sensitive     LEVOFLOXACIN 1 SENSITIVE Sensitive     PENICILLIN Value in next row Sensitive      SENSITIVE<=0.06    * GROUP B STREP(S.AGALACTIAE)ISOLATED  Blood Culture ID Panel (Reflexed)     Status: Abnormal   Collection Time: 05/29/18  1:50 AM  Result Value Ref Range Status   Enterococcus species NOT DETECTED NOT DETECTED Final   Listeria monocytogenes NOT DETECTED NOT DETECTED Final   Staphylococcus species NOT DETECTED NOT DETECTED Final   Staphylococcus aureus (BCID) NOT DETECTED NOT DETECTED Final   Streptococcus species DETECTED (A) NOT DETECTED Final    Comment: CRITICAL RESULT CALLED TO, READ BACK BY AND VERIFIED WITH: PHARMD ERIN D 1639 [id number] FCP    Streptococcus agalactiae DETECTED (A) NOT DETECTED Final    Comment: CRITICAL RESULT CALLED TO, READ BACK BY AND VERIFIED WITH: PHARMD ERIN D 1639 [id number] FCP    Streptococcus pneumoniae NOT DETECTED NOT DETECTED Final   Streptococcus pyogenes NOT DETECTED NOT DETECTED  Final   Acinetobacter baumannii NOT DETECTED NOT DETECTED Final   Enterobacteriaceae species NOT DETECTED NOT DETECTED Final   Enterobacter cloacae complex NOT DETECTED NOT DETECTED Final   Escherichia coli NOT DETECTED NOT DETECTED Final   Klebsiella oxytoca NOT DETECTED NOT DETECTED Final   Klebsiella pneumoniae NOT DETECTED NOT DETECTED Final   Proteus species NOT DETECTED NOT DETECTED Final   Serratia marcescens NOT DETECTED NOT DETECTED Final   Haemophilus influenzae NOT DETECTED NOT DETECTED Final   Neisseria meningitidis NOT DETECTED NOT DETECTED Final   Pseudomonas aeruginosa NOT DETECTED NOT DETECTED Final   Candida albicans NOT DETECTED NOT DETECTED Final   Candida glabrata NOT DETECTED NOT  DETECTED Final   Candida krusei NOT DETECTED NOT DETECTED Final   Candida parapsilosis NOT DETECTED NOT DETECTED Final   Candida tropicalis NOT DETECTED NOT DETECTED Final    Comment: Performed at [facility], [address] [place], [place] [address]  Culture, blood (Routine x 2)     Status: Abnormal   Collection Time: 05/29/18  2:00 AM  Result Value Ref Range Status   Specimen Description BLOOD LEFT ANTECUBITAL  Final   Special Requests   Final    BOTTLES DRAWN AEROBIC AND ANAEROBIC Blood Culture adequate volume   Culture  Setup Time   Final    GRAM POSITIVE COCCI IN CHAINS IN BOTH AEROBIC AND ANAEROBIC BOTTLES CRITICAL VALUE NOTED.  VALUE IS CONSISTENT WITH PREVIOUSLY REPORTED AND CALLED VALUE.    Culture (A)  Final    GROUP B STREP(S.AGALACTIAE)ISOLATED SUSCEPTIBILITIES PERFORMED ON PREVIOUS CULTURE WITHIN THE LAST 5 DAYS. Performed at [facility], [address], [place], [place] [address]    Report Status 05/31/2018 FINAL  Final  Culture, blood (routine x 2)     Status: None (Preliminary result)   Collection Time: 05/30/18 10:54 AM  Result Value Ref Range Status   Specimen Description BLOOD RIGHT HAND  Final   Special Requests   Final    BOTTLES DRAWN AEROBIC  ONLY Blood Culture adequate volume   Culture   Final    NO GROWTH 3 DAYS Performed at [facility], [address], [place], [place] [address]    Report Status PENDING  Incomplete  Culture, blood (routine x 2)     Status: None (Preliminary result)   Collection Time: 05/30/18 11:03 AM  Result Value Ref Range Status   Specimen Description BLOOD LEFT HAND  Final   Special Requests   Final    BOTTLES DRAWN AEROBIC ONLY Blood Culture adequate volume   Culture   Final    NO GROWTH 3 DAYS Performed at [facility], [address], [place], [place] [address]    Report Status PENDING  Incomplete    April  A Peterson, MD [facility] [phone] pager   [phone] cell 06/02/2018, 12:12 PM

## 2018-06-02 NOTE — Progress Notes (Signed)
  Echocardiogram Echocardiogram Transesophageal has been performed.  Celene Skeen 06/02/2018, 12:26 PM

## 2018-06-02 NOTE — Progress Notes (Signed)
   06/02/18 1000  Clinical Encounter Type  Visited With Patient and family together  Visit Type Initial  Referral From Nurse  Consult/Referral To Chaplain  Spiritual Encounters  Spiritual Needs Prayer;Emotional  Stress Factors  Patient Stress Factors Health changes  Family Stress Factors Major life changes   Responded to spiritual care consult. PT was alert and family was a bedside. PT was elated about my visit and was looking forward to being discharged soon. Both PT and family were thankful for the visit as they were preparing to go for a procedure. I offered spiritual care with words of encouragement, a listening ear, ministry of presence and finished with a word of prayer. Chaplain available as needed.   Chaplain Orest Dikes [phone]

## 2018-06-02 NOTE — CV Procedure (Addendum)
   Transesophageal Echocardiogram  Indications: Bacteremia with aortic valve replacement  Time out performed  During this procedure the patient is administered a total of Versed 5 mg and Fentanyl 75 mcg to achieve and maintain moderate conscious sedation.  The patient's heart rate, blood pressure, and oxygen saturation are monitored continuously during the procedure. The period of conscious sedation is 20 minutes, of which I was present face-to-face 100% of this time.  Findings:  Left Ventricle: Normal EF, 55%   Mitral Valve: Mild MR eccentric  Aortic Valve: Bioprosthetic AV is normal  Tricuspid Valve: Thickened, severe regurgitation  Left Atrium: No LAA thrombus  Right Atrium: dilated. Chiari network noted (embryologic remnant).   Impression: No endocarditis.   Donato Schultz, MD

## 2018-06-02 NOTE — H&P (View-Only) (Signed)
PROGRESS NOTE    Christine Hicks  MRN:[id number] DOB: 08/31/1955 DOA: 05/29/2018 PCP: Asenso, Philip, MD  Brief Narrative: Christine Hicks is a 62 y.o. 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  Streptococcus agalactiae bacteremia -Etiology is suspected to be early left thigh cellulitis-patient did report some thigh tenderness 3 days ago, exam unimpressive at this time -Repeat cultures negative x 72 hours -2D echocardiogram-without evidence of endocarditis, h/o AVR -Infectious disease consult appreciated, TEE recommended, discussed with cardiology plan for TEE today 11/7 -Currently on IV penicillin day 4 -I have ordered a PICC line -Duration of Abx per ID depending on TEE -needs HHRN at discharge  2.  Chronic orthostatic hypotension -on low dose midodrine at baseline, resumed  3.  Chronic diastolic CHF -she is on spironolactone and Demadex at baseline -euvolemic, resumed Aldactone and Demadex   4.  H/o severe aortic stenosis  -status post porcine aortic valve replacement -TEE pending for today  5.  Acute kidney injury and chronic kidney disease 2 -resumed spironolactone and Demadex at lower dose -Creatinine improved   6.  Chronic atrial fibrillation -Stable, continue Eliquis  DVT prophylaxis: On Eliquis Code Status: Full code Family Communication: son at bedside Disposition Plan: Home pending above work-up  Consultants:   Infectious disease Dr. Campbell   Procedures:     Antimicrobials:  Antibiotics Given (last 72 hours)    Date/Time Action Medication Dose Rate   05/30/18 1112 New Bag/Given   penicillin G potassium 4 Million Units in dextrose 5 % 250 mL IVPB 4 Million Units 250 mL/hr   05/30/18 1541 New Bag/Given   penicillin G potassium 4 Million Units in dextrose 5 % 250 mL IVPB 4 Million Units 250 mL/hr   05/30/18 1849 New Bag/Given   penicillin G potassium 4 Million Units in dextrose 5 % 250 mL IVPB 4 Million Units 250 mL/hr   05/30/18 2301 New Bag/Given   penicillin G potassium 4 Million Units in dextrose 5 % 250 mL IVPB 4 Million Units 250 mL/hr   05/31/18 0318 New Bag/Given   penicillin G potassium 4 Million Units in dextrose 5 % 250 mL IVPB 4 Million Units 250 mL/hr   05/31/18 0614 New Bag/Given   penicillin G potassium 4 Million Units in dextrose 5 % 250 mL IVPB 4 Million Units 250 mL/hr   05/31/18 1123 New Bag/Given   penicillin G potassium 4 Million Units in dextrose 5 % 250 mL IVPB 4 Million Units 250 mL/hr   05/31/18 1545 New Bag/Given   penicillin G potassium 4 Million Units in dextrose 5 % 250 mL IVPB 4 Million Units 250 mL/hr   05/31/18 1913 New Bag/Given   penicillin G potassium 4 Million Units in dextrose 5 % 250 mL IVPB 4 Million Units 250 mL/hr   05/31/18 2337 New Bag/Given   penicillin G potassium 4 Million Units in dextrose 5 % 250 mL IVPB 4 Million Units 250 mL/hr   06/01/18 0206 New Bag/Given   penicillin G potassium 4 Million Units in dextrose 5 % 250 mL IVPB 4 Million Units 250 mL/hr   06/01/18   0600 New Bag/Given   penicillin G potassium 4 Million Units in dextrose 5 % 250 mL IVPB 4 Million Units 250 mL/hr   06/01/18 1056 New Bag/Given   penicillin G potassium 4 Million Units in dextrose 5 % 250 mL IVPB 4 Million Units 250 mL/hr   06/01/18 1503 New Bag/Given   penicillin G potassium 4 Million Units in dextrose 5 % 250 mL IVPB 4 Million Units 250 mL/hr   06/01/18 1842 New Bag/Given   penicillin G potassium 4 Million Units  in dextrose 5 % 250 mL IVPB 4 Million Units 250 mL/hr   06/01/18 2354 New Bag/Given   penicillin G potassium 4 Million Units in dextrose 5 % 250 mL IVPB 4 Million Units 250 mL/hr   06/02/18 0209 New Bag/Given   penicillin G potassium 4 Million Units in dextrose 5 % 250 mL IVPB 4 Million Units 250 mL/hr   06/02/18 0640 New Bag/Given   penicillin G potassium 4 Million Units in dextrose 5 % 250 mL IVPB 4 Million Units 250 mL/hr      Subjective: -no complaints feels well  Objective: Vitals:   06/01/18 0555 06/01/18 1316 06/01/18 2224 06/02/18 0645  BP: (!) 117/58 (!) 113/54 (!) 117/56 (!) 117/59  Pulse: 65 (!) 59 66 71  Resp:  (!) 24  14  Temp: 98 F (36.7 C) 97.7 F (36.5 C) 98.3 F (36.8 C) 98.3 F (36.8 C)  TempSrc: Oral Oral    SpO2: 99% 100% 99% 99%  Weight:      Height:        Intake/Output Summary (Last 24 hours) at 06/02/2018 1032 Last data filed at 06/02/2018 0547 Gross per 24 hour  Intake 6370.83 ml  Output 1 ml  Net 6369.83 ml   Filed Weights   05/29/18 0200 05/29/18 0934  Weight: 105.5 kg 103.9 kg    Examination:  Gen: Awake, Alert, Oriented X 3, obese, no distress HEENT: PERRLA, no JVD Lungs: CTAB CVS: S1S2/RRR systolic murmur Abd: soft, Non tender, non distended, BS present Extremities:  Hyperpigmentation bilaterally with large varicose veins, mild left thigh tenderness, improving Skin: as above Psychiatry: Judgement and insight appear normal. Mood & affect appropriate.     Data Reviewed:   CBC: Recent Labs  Lab 05/29/18 0204 05/31/18 0432 06/01/18 0257 06/02/18 0323  WBC 10.0 10.2 8.1 6.8  NEUTROABS 8.5*  --   --   --   HGB 14.2 12.7 11.7* 11.5*  HCT 45.9 40.1 36.1 36.7  MCV 99.8 99.8 99.2 98.7  PLT 196 155 162 164   Basic Metabolic Panel: Recent Labs  Lab 05/29/18 0956 05/30/18 0341 05/31/18 0432 06/01/18 0257 06/02/18 0323  NA 138 134* 131* 131* 134*  K 3.9 4.4 4.4 4.8 4.1  CL 110 106 105 105 103  CO2 20* 22 21* 21* 26    GLUCOSE 134* 145* 138* 119* 113*  BUN 27* 33* 35* 25* 20  CREATININE 1.18* 1.46* 1.20* 0.98 0.94  CALCIUM 8.8* 9.3 9.0 8.7* 8.7*   GFR: Estimated Creatinine Clearance: 73.3 mL/min (by C-G formula based on SCr of 0.94 mg/dL). Liver Function Tests: Recent Labs  Lab 05/29/18 0204  AST 34  ALT 17  ALKPHOS 109  BILITOT 1.5*  PROT 8.0  ALBUMIN 4.1   No results for input(s): LIPASE, AMYLASE in the last 168 hours. No results for input(s): AMMONIA in the last 168 hours. Coagulation Profile: Recent Labs  Lab 05/29/18 0204  INR 1.56   Cardiac Enzymes: Recent Labs    Lab 05/29/18 0956  CKTOTAL 47   BNP (last 3 results) No results for input(s): PROBNP in the last 8760 hours. HbA1C: No results for input(s): HGBA1C in the last 72 hours. CBG: Recent Labs  Lab 05/29/18 0238  GLUCAP 95   Lipid Profile: No results for input(s): CHOL, HDL, LDLCALC, TRIG, CHOLHDL, LDLDIRECT in the last 72 hours. Thyroid Function Tests: No results for input(s): TSH, T4TOTAL, FREET4, T3FREE, THYROIDAB in the last 72 hours. Anemia Panel: No results for input(s): VITAMINB12, FOLATE, FERRITIN, TIBC, IRON, RETICCTPCT in the last 72 hours. Urine analysis:    Component Value Date/Time   COLORURINE YELLOW 05/29/2018 0455   APPEARANCEUR CLEAR 05/29/2018 0455   LABSPEC 1.010 05/29/2018 0455   PHURINE 5.0 05/29/2018 0455   GLUCOSEU NEGATIVE 05/29/2018 0455   HGBUR NEGATIVE 05/29/2018 0455   BILIRUBINUR NEGATIVE 05/29/2018 0455   KETONESUR NEGATIVE 05/29/2018 0455   PROTEINUR NEGATIVE 05/29/2018 0455   UROBILINOGEN 0.2 06/22/2010 2055   NITRITE NEGATIVE 05/29/2018 0455   LEUKOCYTESUR SMALL (A) 05/29/2018 0455   Sepsis Labs: @LABRCNTIP(procalcitonin:4,lacticidven:4)  ) Recent Results (from the past 240 hour(s))  Culture, blood (Routine x 2)     Status: Abnormal   Collection Time: 05/29/18  1:50 AM  Result Value Ref Range Status   Specimen Description BLOOD RIGHT ANTECUBITAL  Final   Special  Requests   Final    BOTTLES DRAWN AEROBIC AND ANAEROBIC Blood Culture adequate volume   Culture  Setup Time   Final    GRAM POSITIVE COCCI IN CHAINS BLOOD AEROBIC BOTTLE CRITICAL RESULT CALLED TO, READ BACK BY AND VERIFIED WITH: PHARMD ERIN D [id number] FCP Performed at [facility], [address] [place],  [address]    Culture GROUP B STREP(S.AGALACTIAE)ISOLATED (A)  Final   Report Status 05/31/2018 FINAL  Final   Organism ID, Bacteria GROUP B STREP(S.AGALACTIAE)ISOLATED  Final      Susceptibility   Group b strep(s.agalactiae)isolated - MIC*    CLINDAMYCIN <=0.25 SENSITIVE Sensitive     AMPICILLIN <=0.25 SENSITIVE Sensitive     ERYTHROMYCIN <=0.12 SENSITIVE Sensitive     VANCOMYCIN 0.5 SENSITIVE Sensitive     CEFTRIAXONE <=0.12 SENSITIVE Sensitive     LEVOFLOXACIN 1 SENSITIVE Sensitive     PENICILLIN Value in next row Sensitive      SENSITIVE<=0.06    * GROUP B STREP(S.AGALACTIAE)ISOLATED  Blood Culture ID Panel (Reflexed)     Status: Abnormal   Collection Time: 05/29/18  1:50 AM  Result Value Ref Range Status   Enterococcus species NOT DETECTED NOT DETECTED Final   Listeria monocytogenes NOT DETECTED NOT DETECTED Final   Staphylococcus species NOT DETECTED NOT DETECTED Final   Staphylococcus aureus (BCID) NOT DETECTED NOT DETECTED Final   Streptococcus species DETECTED (A) NOT DETECTED Final    Comment: CRITICAL RESULT CALLED TO, READ BACK BY AND VERIFIED WITH: PHARMD ERIN D [id number] FCP    Streptococcus agalactiae DETECTED (A) NOT DETECTED Final    Comment: CRITICAL RESULT CALLED TO, READ BACK BY AND VERIFIED WITH: PHARMD ERIN D [id number] FCP    Streptococcus pneumoniae NOT DETECTED NOT DETECTED Final   Streptococcus pyogenes NOT DETECTED NOT DETECTED Final   Acinetobacter baumannii NOT DETECTED NOT DETECTED Final   Enterobacteriaceae species NOT DETECTED NOT DETECTED Final   Enterobacter cloacae complex NOT DETECTED NOT DETECTED Final   Escherichia  coli NOT DETECTED NOT DETECTED Final   Klebsiella oxytoca NOT DETECTED NOT DETECTED Final   Klebsiella pneumoniae NOT DETECTED   NOT DETECTED Final   Proteus species NOT DETECTED NOT DETECTED Final   Serratia marcescens NOT DETECTED NOT DETECTED Final   Haemophilus influenzae NOT DETECTED NOT DETECTED Final   Neisseria meningitidis NOT DETECTED NOT DETECTED Final   Pseudomonas aeruginosa NOT DETECTED NOT DETECTED Final   Candida albicans NOT DETECTED NOT DETECTED Final   Candida glabrata NOT DETECTED NOT DETECTED Final   Candida krusei NOT DETECTED NOT DETECTED Final   Candida parapsilosis NOT DETECTED NOT DETECTED Final   Candida tropicalis NOT DETECTED NOT DETECTED Final    Comment: Performed at [facility], [address] [place], [place] [address]  Culture, blood (Routine x 2)     Status: Abnormal   Collection Time: 05/29/18  2:00 AM  Result Value Ref Range Status   Specimen Description BLOOD LEFT ANTECUBITAL  Final   Special Requests   Final    BOTTLES DRAWN AEROBIC AND ANAEROBIC Blood Culture adequate volume   Culture  Setup Time   Final    GRAM POSITIVE COCCI IN CHAINS IN BOTH AEROBIC AND ANAEROBIC BOTTLES CRITICAL VALUE NOTED.  VALUE IS CONSISTENT WITH PREVIOUSLY REPORTED AND CALLED VALUE.    Culture (A)  Final    GROUP B STREP(S.AGALACTIAE)ISOLATED SUSCEPTIBILITIES PERFORMED ON PREVIOUS CULTURE WITHIN THE LAST 5 DAYS. Performed at [facility], [address] [place], [place] [address]    Report Status 05/31/2018 FINAL  Final  Culture, blood (routine x 2)     Status: None (Preliminary result)   Collection Time: 05/30/18 10:54 AM  Result Value Ref Range Status   Specimen Description BLOOD RIGHT HAND  Final   Special Requests   Final    BOTTLES DRAWN AEROBIC ONLY Blood Culture adequate volume   Culture   Final    NO GROWTH 3 DAYS Performed at [facility], [address] [place], [place] [address]    Report Status PENDING  Incomplete  Culture,  blood (routine x 2)     Status: None (Preliminary result)   Collection Time: 05/30/18 11:03 AM  Result Value Ref Range Status   Specimen Description BLOOD LEFT HAND  Final   Special Requests   Final    BOTTLES DRAWN AEROBIC ONLY Blood Culture adequate volume   Culture   Final    NO GROWTH 3 DAYS Performed at [facility], [address] , [place] [address]    Report Status PENDING  Incomplete         Radiology Studies: No results found.      Scheduled Meds: . allopurinol  100 mg Oral Daily  . apixaban  2.5 mg Oral BID  . aspirin EC  81 mg Oral Daily  . [START ON 06/16/2018] cyanocobalamin  1,000 mcg Intramuscular Q30 days  . ferrous sulfate  325 mg Oral BID WC  . fluticasone  1 spray Each Nare Daily  . Influenza vac split quadrivalent PF  0.5 mL Intramuscular Tomorrow-1000  . levothyroxine  175 mcg Oral QAC breakfast  . magnesium oxide  400 mg Oral Daily  . midodrine  2.5 mg Oral TID WC  . pantoprazole  40 mg Oral Daily  . spironolactone  25 mg Oral Daily  . torsemide  40 mg Oral BID   Continuous Infusions: . pencillin G potassium IV 4 Million Units (06/02/18 0640)     LOS: 4 days    Time spent: 25min    Volanda Mangine, MD [facility] Page via [email or web address], password [email or web address]   After 7PM please contact night-coverage  06/02/2018, 10:32 AM   

## 2018-06-02 NOTE — Progress Notes (Signed)
PHARMACY CONSULT NOTE FOR:  OUTPATIENT  PARENTERAL ANTIBIOTIC THERAPY (OPAT)  Indication: Strep Agalactiae bacteremia w/ porcine aortic valve Regimen: IV Pencillin 24 million units every 24 hours as a continuous infusion  End date: 06/26/18  IV antibiotic discharge orders are pended. To discharging provider:  please sign these orders via discharge navigator,  Select New Orders & click on the button choice - Manage This Unsigned Work.     Thank you for allowing pharmacy to be a part of this patient's care.  Della Goo, PharmD, BCPS  Infectious Diseases Clinical Pharmacist Phone: [phone] 06/02/2018, 3:18 PM

## 2018-06-02 NOTE — Progress Notes (Signed)
Peripherally Inserted Central Catheter/Midline Placement  The IV Nurse has discussed with the patient and/or persons authorized to consent for the patient, the purpose of this procedure and the potential benefits and risks involved with this procedure.  The benefits include less needle sticks, lab draws from the catheter, and the patient may be discharged home with the catheter. Risks include, but not limited to, infection, bleeding, blood clot (thrombus formation), and puncture of an artery; nerve damage and irregular heartbeat and possibility to perform a PICC exchange if needed/ordered by physician.  Alternatives to this procedure were also discussed.  Bard Power PICC patient education guide, fact sheet on infection prevention and patient information card has been provided to patient /or left at bedside.    PICC/Midline Placement Documentation  PICC Single Lumen 06/02/18 PICC Right Brachial 39 cm 0 cm (Active)  Indication for Insertion or Continuance of Line Home intravenous therapies (PICC only) 06/02/2018  2:17 PM  Exposed Catheter (cm) 0 cm 06/02/2018  2:17 PM  Site Assessment Clean;Dry;Intact 06/02/2018  2:17 PM  Line Status Flushed;Blood return noted;Saline locked 06/02/2018  2:17 PM  Dressing Type Transparent 06/02/2018  2:17 PM  Dressing Status Clean;Dry;Intact 06/02/2018  2:17 PM  Dressing Change Due 06/09/18 06/02/2018  2:17 PM       Audrie Gallus 06/02/2018, 2:19 PM

## 2018-06-02 NOTE — Progress Notes (Signed)
PROGRESS NOTE    Christine Hicks  WUJ:[id number] DOB: [birth year] DOA: 05/29/2018 PCP: Annita Brod, MD  Brief Narrative: Christine Hicks is a 62 y.o. 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  Streptococcus agalactiae bacteremia -Etiology is suspected to be early left thigh cellulitis-patient did report some thigh tenderness 3 days ago, exam unimpressive at this time -Repeat cultures negative x 72 hours -2D echocardiogram-without evidence of endocarditis, h/o AVR -Infectious disease consult appreciated, TEE recommended, discussed with cardiology plan for TEE today 11/7 -Currently on IV penicillin day 4 -I have ordered a PICC line -Duration of Abx per ID depending on TEE -needs HHRN at discharge  2.  Chronic orthostatic hypotension -on low dose midodrine at baseline, resumed  3.  Chronic diastolic CHF -she is on spironolactone and Demadex at baseline -euvolemic, resumed Aldactone and Demadex   4.  H/o severe aortic stenosis  -status post porcine aortic valve replacement -TEE pending for today  5.  Acute kidney injury and chronic kidney disease 2 -resumed spironolactone and Demadex at lower dose -Creatinine improved   6.  Chronic atrial fibrillation -Stable, continue Eliquis  DVT prophylaxis: On Eliquis Code Status: Full code Family Communication: son at bedside Disposition Plan: Home pending above work-up  Consultants:   Infectious disease Dr. Orvan Falconer   Procedures:     Antimicrobials:  Antibiotics Given (last 72 hours)    Date/Time Action Medication Dose Rate   05/30/18 1112 New Bag/Given   penicillin G potassium 4 Million Units in dextrose 5 % 250 mL IVPB 4 Million Units 250 mL/hr   05/30/18 1541 New Bag/Given   penicillin G potassium 4 Million Units in dextrose 5 % 250 mL IVPB 4 Million Units 250 mL/hr   05/30/18 1849 New Bag/Given   penicillin G potassium 4 Million Units in dextrose 5 % 250 mL IVPB 4 Million Units 250 mL/hr   05/30/18 2301 New Bag/Given   penicillin G potassium 4 Million Units in dextrose 5 % 250 mL IVPB 4 Million Units 250 mL/hr   05/31/18 0318 New Bag/Given   penicillin G potassium 4 Million Units in dextrose 5 % 250 mL IVPB 4 Million Units 250 mL/hr   05/31/18 9562 New Bag/Given   penicillin G potassium 4 Million Units in dextrose 5 % 250 mL IVPB 4 Million Units 250 mL/hr   05/31/18 1123 New Bag/Given   penicillin G potassium 4 Million Units in dextrose 5 % 250 mL IVPB 4 Million Units 250 mL/hr   05/31/18 1545 New Bag/Given   penicillin G potassium 4 Million Units in dextrose 5 % 250 mL IVPB 4 Million Units 250 mL/hr   05/31/18 1913 New Bag/Given   penicillin G potassium 4 Million Units in dextrose 5 % 250 mL IVPB 4 Million Units 250 mL/hr   05/31/18 2337 New Bag/Given   penicillin G potassium 4 Million Units in dextrose 5 % 250 mL IVPB 4 Million Units 250 mL/hr   06/01/18 0206 New Bag/Given   penicillin G potassium 4 Million Units in dextrose 5 % 250 mL IVPB 4 Million Units 250 mL/hr   06/01/18  0600 New Bag/Given   penicillin G potassium 4 Million Units in dextrose 5 % 250 mL IVPB 4 Million Units 250 mL/hr   06/01/18 1056 New Bag/Given   penicillin G potassium 4 Million Units in dextrose 5 % 250 mL IVPB 4 Million Units 250 mL/hr   06/01/18 1503 New Bag/Given   penicillin G potassium 4 Million Units in dextrose 5 % 250 mL IVPB 4 Million Units 250 mL/hr   06/01/18 1842 New Bag/Given   penicillin G potassium 4 Million Units  in dextrose 5 % 250 mL IVPB 4 Million Units 250 mL/hr   06/01/18 2354 New Bag/Given   penicillin G potassium 4 Million Units in dextrose 5 % 250 mL IVPB 4 Million Units 250 mL/hr   06/02/18 0209 New Bag/Given   penicillin G potassium 4 Million Units in dextrose 5 % 250 mL IVPB 4 Million Units 250 mL/hr   06/02/18 0640 New Bag/Given   penicillin G potassium 4 Million Units in dextrose 5 % 250 mL IVPB 4 Million Units 250 mL/hr      Subjective: -no complaints feels well  Objective: Vitals:   06/01/18 0555 06/01/18 1316 06/01/18 2224 06/02/18 0645  BP: (!) 117/58 (!) 113/54 (!) 117/56 (!) 117/59  Pulse: 65 (!) 59 66 71  Resp:  (!) 24  14  Temp: 98 F (36.7 C) 97.7 F (36.5 C) 98.3 F (36.8 C) 98.3 F (36.8 C)  TempSrc: Oral Oral    SpO2: 99% 100% 99% 99%  Weight:      Height:        Intake/Output Summary (Last 24 hours) at 06/02/2018 1032 Last data filed at 06/02/2018 0547 Gross per 24 hour  Intake 6370.83 ml  Output 1 ml  Net 6369.83 ml   Filed Weights   05/29/18 0200 05/29/18 0934  Weight: 105.5 kg 103.9 kg    Examination:  Gen: Awake, Alert, Oriented X 3, obese, no distress HEENT: PERRLA, no JVD Lungs: CTAB CVS: S1S2/RRR systolic murmur Abd: soft, Non tender, non distended, BS present Extremities:  Hyperpigmentation bilaterally with large varicose veins, mild left thigh tenderness, improving Skin: as above Psychiatry: Judgement and insight appear normal. Mood & affect appropriate.     Data Reviewed:   CBC: Recent Labs  Lab 05/29/18 0204 05/31/18 0432 06/01/18 0257 06/02/18 0323  WBC 10.0 10.2 8.1 6.8  NEUTROABS 8.5*  --   --   --   HGB 14.2 12.7 11.7* 11.5*  HCT 45.9 40.1 36.1 36.7  MCV 99.8 99.8 99.2 98.7  PLT 196 155 162 164   Basic Metabolic Panel: Recent Labs  Lab 05/29/18 0956 05/30/18 0341 05/31/18 0432 06/01/18 0257 06/02/18 0323  NA 138 134* 131* 131* 134*  K 3.9 4.4 4.4 4.8 4.1  CL 110 106 105 105 103  CO2 20* 22 21* 21* 26    GLUCOSE 134* 145* 138* 119* 113*  BUN 27* 33* 35* 25* 20  CREATININE 1.18* 1.46* 1.20* 0.98 0.94  CALCIUM 8.8* 9.3 9.0 8.7* 8.7*   GFR: Estimated Creatinine Clearance: 73.3 mL/min (by C-G formula based on SCr of 0.94 mg/dL). Liver Function Tests: Recent Labs  Lab 05/29/18 0204  AST 34  ALT 17  ALKPHOS 109  BILITOT 1.5*  PROT 8.0  ALBUMIN 4.1   No results for input(s): LIPASE, AMYLASE in the last 168 hours. No results for input(s): AMMONIA in the last 168 hours. Coagulation Profile: Recent Labs  Lab 05/29/18 0204  INR 1.56   Cardiac Enzymes: Recent Labs  Lab 05/29/18 0956  CKTOTAL 47   BNP (last 3 results) No results for input(s): PROBNP in the last 8760 hours. HbA1C: No results for input(s): HGBA1C in the last 72 hours. CBG: Recent Labs  Lab 05/29/18 0238  GLUCAP 95   Lipid Profile: No results for input(s): CHOL, HDL, LDLCALC, TRIG, CHOLHDL, LDLDIRECT in the last 72 hours. Thyroid Function Tests: No results for input(s): TSH, T4TOTAL, FREET4, T3FREE, THYROIDAB in the last 72 hours. Anemia Panel: No results for input(s): VITAMINB12, FOLATE, FERRITIN, TIBC, IRON, RETICCTPCT in the last 72 hours. Urine analysis:    Component Value Date/Time   COLORURINE YELLOW 05/29/2018 0455   APPEARANCEUR CLEAR 05/29/2018 0455   LABSPEC 1.010 05/29/2018 0455   PHURINE 5.0 05/29/2018 0455   GLUCOSEU NEGATIVE 05/29/2018 0455   HGBUR NEGATIVE 05/29/2018 0455   BILIRUBINUR NEGATIVE 05/29/2018 0455   KETONESUR NEGATIVE 05/29/2018 0455   PROTEINUR NEGATIVE 05/29/2018 0455   UROBILINOGEN 0.2 06/22/2010 2055   NITRITE NEGATIVE 05/29/2018 0455   LEUKOCYTESUR SMALL (A) 05/29/2018 0455   Sepsis Labs: @LABRCNTIP (procalcitonin:4,lacticidven:4)  ) Recent Results (from the past 240 hour(s))  Culture, blood (Routine x 2)     Status: Abnormal   Collection Time: 05/29/18  1:50 AM  Result Value Ref Range Status   Specimen Description BLOOD RIGHT ANTECUBITAL  Final   Special  Requests   Final    BOTTLES DRAWN AEROBIC AND ANAEROBIC Blood Culture adequate volume   Culture  Setup Time   Final    GRAM POSITIVE COCCI IN CHAINS BLOOD AEROBIC BOTTLE CRITICAL RESULT CALLED TO, READ BACK BY AND VERIFIED WITH: Regino Bellow D [id number] FCP Performed at [facility], [address] [place], [place] [address]    Culture GROUP B STREP(S.AGALACTIAE)ISOLATED (A)  Final   Report Status 05/31/2018 FINAL  Final   Organism ID, Bacteria GROUP B STREP(S.AGALACTIAE)ISOLATED  Final      Susceptibility   Group b strep(s.agalactiae)isolated - MIC*    CLINDAMYCIN <=0.25 SENSITIVE Sensitive     AMPICILLIN <=0.25 SENSITIVE Sensitive     ERYTHROMYCIN <=0.12 SENSITIVE Sensitive     VANCOMYCIN 0.5 SENSITIVE Sensitive     CEFTRIAXONE <=0.12 SENSITIVE Sensitive     LEVOFLOXACIN 1 SENSITIVE Sensitive     PENICILLIN Value in next row Sensitive      SENSITIVE<=0.06    * GROUP B STREP(S.AGALACTIAE)ISOLATED  Blood Culture ID Panel (Reflexed)     Status: Abnormal   Collection Time: 05/29/18  1:50 AM  Result Value Ref Range Status   Enterococcus species NOT DETECTED NOT DETECTED Final   Listeria monocytogenes NOT DETECTED NOT DETECTED Final   Staphylococcus species NOT DETECTED NOT DETECTED Final   Staphylococcus aureus (BCID) NOT DETECTED NOT DETECTED Final   Streptococcus species DETECTED (A) NOT DETECTED Final    Comment: CRITICAL RESULT CALLED TO, READ BACK BY AND VERIFIED WITH: PHARMD ERIN D 1639 [id number] FCP    Streptococcus agalactiae DETECTED (A) NOT DETECTED Final    Comment: CRITICAL RESULT CALLED TO, READ BACK BY AND VERIFIED WITH: PHARMD ERIN D 1639 [id number] FCP    Streptococcus pneumoniae NOT DETECTED NOT DETECTED Final   Streptococcus pyogenes NOT DETECTED NOT DETECTED Final   Acinetobacter baumannii NOT DETECTED NOT DETECTED Final   Enterobacteriaceae species NOT DETECTED NOT DETECTED Final   Enterobacter cloacae complex NOT DETECTED NOT DETECTED Final   Escherichia  coli NOT DETECTED NOT DETECTED Final   Klebsiella oxytoca NOT DETECTED NOT DETECTED Final   Klebsiella pneumoniae NOT DETECTED  NOT DETECTED Final   Proteus species NOT DETECTED NOT DETECTED Final   Serratia marcescens NOT DETECTED NOT DETECTED Final   Haemophilus influenzae NOT DETECTED NOT DETECTED Final   Neisseria meningitidis NOT DETECTED NOT DETECTED Final   Pseudomonas aeruginosa NOT DETECTED NOT DETECTED Final   Candida albicans NOT DETECTED NOT DETECTED Final   Candida glabrata NOT DETECTED NOT DETECTED Final   Candida krusei NOT DETECTED NOT DETECTED Final   Candida parapsilosis NOT DETECTED NOT DETECTED Final   Candida tropicalis NOT DETECTED NOT DETECTED Final    Comment: Performed at [facility], [address] [place], [place] [address]  Culture, blood (Routine x 2)     Status: Abnormal   Collection Time: 05/29/18  2:00 AM  Result Value Ref Range Status   Specimen Description BLOOD LEFT ANTECUBITAL  Final   Special Requests   Final    BOTTLES DRAWN AEROBIC AND ANAEROBIC Blood Culture adequate volume   Culture  Setup Time   Final    GRAM POSITIVE COCCI IN CHAINS IN BOTH AEROBIC AND ANAEROBIC BOTTLES CRITICAL VALUE NOTED.  VALUE IS CONSISTENT WITH PREVIOUSLY REPORTED AND CALLED VALUE.    Culture (A)  Final    GROUP B STREP(S.AGALACTIAE)ISOLATED SUSCEPTIBILITIES PERFORMED ON PREVIOUS CULTURE WITHIN THE LAST 5 DAYS. Performed at [facility], [address], [place], [place] [address]    Report Status 05/31/2018 FINAL  Final  Culture, blood (routine x 2)     Status: None (Preliminary result)   Collection Time: 05/30/18 10:54 AM  Result Value Ref Range Status   Specimen Description BLOOD RIGHT HAND  Final   Special Requests   Final    BOTTLES DRAWN AEROBIC ONLY Blood Culture adequate volume   Culture   Final    NO GROWTH 3 DAYS Performed at [facility], [address], [place], [place] [address]    Report Status PENDING  Incomplete  Culture,  blood (routine x 2)     Status: None (Preliminary result)   Collection Time: 05/30/18 11:03 AM  Result Value Ref Range Status   Specimen Description BLOOD LEFT HAND  Final   Special Requests   Final    BOTTLES DRAWN AEROBIC ONLY Blood Culture adequate volume   Culture   Final    NO GROWTH 3 DAYS Performed at [facility], [address] [place], [place] [address]    Report Status PENDING  Incomplete         Radiology Studies: No results found.      Scheduled Meds: . allopurinol  100 mg Oral Daily  . apixaban  2.5 mg Oral BID  . aspirin EC  81 mg Oral Daily  . [START ON 06/16/2018] cyanocobalamin  1,000 mcg Intramuscular Q30 days  . ferrous sulfate  325 mg Oral BID WC  . fluticasone  1 spray Each Nare Daily  . Influenza vac split quadrivalent PF  0.5 mL Intramuscular Tomorrow-1000  . levothyroxine  175 mcg Oral QAC breakfast  . magnesium oxide  400 mg Oral Daily  . midodrine  2.5 mg Oral TID WC  . pantoprazole  40 mg Oral Daily  . spironolactone  25 mg Oral Daily  . torsemide  40 mg Oral BID   Continuous Infusions: . pencillin G potassium IV 4 Million Units (06/02/18 0640)     LOS: 4 days    Time spent:    Zannie Cove, MD [facility] Page via [email or web address], password [facility]  After 7PM please contact night-coverage  06/02/2018, 10:32 AM

## 2018-06-03 DIAGNOSIS — B955 Unspecified streptococcus as the cause of diseases classified elsewhere: Secondary | ICD-10-CM

## 2018-06-03 LAB — BASIC METABOLIC PANEL
Anion gap: 9 (ref 5–15)
BUN: 14 mg/dL (ref 8–23)
CALCIUM: 8.8 mg/dL — AB (ref 8.9–10.3)
CO2: 25 mmol/L (ref 22–32)
Chloride: 101 mmol/L (ref 98–111)
Creatinine, Ser: 0.9 mg/dL (ref 0.44–1.00)
GFR calc Af Amer: >60 mL/min (ref >60)
GFR calc non Af Amer: >60 mL/min (ref >60)
Glucose, Bld: 93 mg/dL (ref 70–99)
Potassium: 3.6 mmol/L (ref 3.5–5.1)
SODIUM: 135 mmol/L (ref 135–145)

## 2018-06-03 LAB — CBC
HCT: 38.1 % (ref 36.0–46.0)
HEMOGLOBIN: 11.9 g/dL — AB (ref 12.0–15.0)
MCH: 31.1 pg (ref 26.0–34.0)
MCHC: 31.2 g/dL (ref 30.0–36.0)
MCV: 99.5 fL (ref 80.0–100.0)
Platelets: 178 10*3/uL (ref 150–400)
RBC: 3.83 MIL/uL — ABNORMAL LOW (ref 3.87–5.11)
RDW: 14.7 % (ref 11.5–15.5)
WBC: 7.6 10*3/uL (ref 4.0–10.5)
nRBC: 0 % (ref 0.0–0.2)

## 2018-06-03 MED ORDER — PENICILLIN G POTASSIUM 20000000 UNITS IJ SOLR: 4.0000 10*6.[IU] | INTRAVENOUS | 0 refills | Status: AC

## 2018-06-03 MED ORDER — PENICILLIN G POTASSIUM IV (FOR PTA / DISCHARGE USE ONLY): 24.0000 10*6.[IU] | INTRAVENOUS | 0 refills | Status: AC

## 2018-06-03 NOTE — Progress Notes (Signed)
Physical Therapy Treatment Patient Details Name: Christine Hicks MRN: [id number] DOB: [birth year] Today's Date: 06/03/2018    History of Present Illness Christine Hicks is a 62yo female who comes to [facility] obn 11/2 c fever, chills, myalgia. PMH: CHF, falls, morbid obestiy, HTN, DVT, GERD, CKD2, aortic stenosis, AVR.     PT Comments    Patient motivated to ambulate with PT this morning. Prefers use of RW for stability rather than no AD. Able to scan environment and navigate obstacles without issue or LOB. Discussion on home safety with mobility with good verbal understanding.      Follow Up Recommendations  No PT follow up     Equipment Recommendations  None recommended by PT    Recommendations for Other Services       Precautions / Restrictions Precautions Precautions: Fall Restrictions Weight Bearing Restrictions: No    Mobility  Bed Mobility               General bed mobility comments: recieved with patient up walking in room packing bags  Transfers Overall transfer level: Modified independent                  Ambulation/Gait Ambulation/Gait assistance: Modified independent (Device/Increase time) Gait Distance (Feet): 400 Feet Assistive device: Rolling walker (2 wheeled)(patient feeling more comfortable with device) Gait Pattern/deviations: Step-through pattern;Decreased stride length;Trunk flexed     General Gait Details: Mod I with patietn able to scan environment and navigate obstacles   Stairs             Wheelchair Mobility    Modified Rankin (Stroke Patients Only)       Balance Overall balance assessment: No apparent balance deficits (not formally assessed)                                          Cognition Arousal/Alertness: Awake/alert Behavior During Therapy: WFL for tasks assessed/performed Overall Cognitive Status: Within Functional Limits for tasks assessed                                         Exercises      General Comments        Pertinent Vitals/Pain Pain Assessment: No/denies pain    Home Living                      Prior Function            PT Goals (current goals can now be found in the care plan section) Acute Rehab PT Goals Patient Stated Goal: return to home, continue to progress mobility independently  PT Goal Formulation: With patient Time For Goal Achievement: 06/15/18 Potential to Achieve Goals: Good Progress towards PT goals: Progressing toward goals    Frequency    Min 3X/week      PT Plan Current plan remains appropriate    Co-evaluation              AM-PAC PT "6 Clicks" Daily Activity  Outcome Measure  Difficulty turning over in bed (including adjusting bedclothes, sheets and blankets)?: A Little Difficulty moving from lying on back to sitting on the side of the bed? : A Little Difficulty sitting down on and standing up from a chair with arms (e.g., wheelchair, bedside commode, etc,.)?:  A Little Help needed moving to and from a bed to chair (including a wheelchair)?: A Little Help needed walking in hospital room?: A Little Help needed climbing 3-5 steps with a railing? : A Little 6 Click Score: 18    End of Session Equipment Utilized During Treatment: Gait belt Activity Tolerance: Patient tolerated treatment well Patient left: in chair;with call bell/phone within reach;with family/visitor present Nurse Communication: Mobility status PT Visit Diagnosis: Unsteadiness on feet (R26.81);Difficulty in walking, not elsewhere classified (R26.2)     Time: 9562-1308 PT Time Calculation (min) (ACUTE ONLY): 14 min  Charges:  $Gait Training: 8-22 mins                     Kipp Laurence, PT, DPT Supplemental Physical Therapist 06/03/18 12:05 PM Pager: [phone] Office: [phone]

## 2018-06-03 NOTE — Progress Notes (Signed)
Dressing changed. No bleeding noted @ site.

## 2018-06-03 NOTE — Progress Notes (Signed)
Pt given discharge instructions, prescriptions, and care notes. RN from [facility] brought IV Abx & supplies to bedside for Pt to take home. Education re: home therapies done. Pt verbalized understanding AEB no further questions or concerns at this time. Picc line in place, clean dry intact. Telemetry discontinued and Centralized Telemetry was notified. Pt left the floor via wheelchair with staff in stable condition.

## 2018-06-03 NOTE — Discharge Summary (Signed)
Christine Hicks, is a 62 y.o. female  DOB 1956/03/02  MRN [id number].  Admission date:  05/29/2018  Admitting Physician  Guilford Shi, MD  Discharge Date:  06/03/2018   Primary MD  Clovia Cuff, MD  Recommendations for primary care physician for things to follow:  -To continue with IV antibiotic regimen until 06/26/2018, labs to be monitored per pharmacy recommendation, will need to follow with ID in 2 to 3 weeks   Admission Diagnosis  Fever, unspecified fever cause [R50.9] Hypotension, unspecified hypotension type [I95.9]   Discharge Diagnosis  Fever, unspecified fever cause [R50.9] Hypotension, unspecified hypotension type [I95.9]    Principal Problem:   Streptococcal bacteremia Active Problems:   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 Acute on chronic diastolic congestive heart failure ([place])   . Anemia, iron deficiency    negative egd/colonoscopy 11/16/2015  . Aortic stenosis, severe   . Arthritis   . Bicuspid aortic valve   . DVT (deep venous thrombosis) ([place])    RLE DVT 11/12/15  . GERD (gastroesophageal reflux disease)   . Heart murmur   . Hypertension   . Insomnia   . Morbid obesity with BMI of 45.0-49.9, adult ([place])   . PONV (postoperative nausea and vomiting)    took a long time to wake up  . 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 Thyroid disease   . Wears glasses   . Wears partial dentures     Past Surgical History:  Procedure Laterality Date  . 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 AORTIC VALVE  REPLACEMENT N/A 01/01/2017   Procedure: MINIMALLY INVASIVE AORTIC VALVE REPLACEMENT (AVR);  Surgeon: Rexene Alberts, MD;  Location: [facility];  Service: Open Heart Surgery;  Laterality: N/A;  . ASCENDING AORTIC ROOT REPLACEMENT N/A 01/01/2017   Procedure: ASCENDING AORTIC ROOT REPLACEMENT;  Surgeon: Rexene Alberts, MD;  Location: [facility];  Service: Open Heart Surgery;  Laterality: N/A;  . CARDIAC CATHETERIZATION     12/16/16  . COLONOSCOPY N/A 11/16/2015   Procedure: COLONOSCOPY;  Surgeon: Gatha Mayer, MD;  Location: [facility];  Service: Endoscopy;  Laterality: N/A;  . ESOPHAGOGASTRODUODENOSCOPY N/A 11/16/2015   Procedure: ESOPHAGOGASTRODUODENOSCOPY (EGD);  Surgeon: Gatha Mayer, MD;  Location: [facility] ENDOSCOPY;  Service: Endoscopy;  Laterality: N/A;  . HERNIA REPAIR    . MULTIPLE TOOTH EXTRACTIONS    . RIGHT HEART CATH N/A 01/11/2018   Procedure: RIGHT HEART CATH;  Surgeon: Jolaine Artist, MD;  Location: [facility];  Service: Cardiovascular;  Laterality: N/A;  . Right leg cellulitis surgery    . TEE WITHOUT CARDIOVERSION N/A 01/01/2017  Procedure: TRANSESOPHAGEAL ECHOCARDIOGRAM (TEE);  Surgeon: Rexene Alberts, MD;  Location: [facility];  Service: Open Heart Surgery;  Laterality: N/A;       History of present illness and  Hospital Course:     Kindly see H&P for history of present illness and admission details, please review complete Labs, Consult reports and Test reports for all details in brief  HPI  from the history and physical done on the day of admission 05/29/2018   HPI: Christine Hicks is a 62 y.o. 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  Patient also particularly points to left thigh and states it has been hurting badly since yesterday and she was unable to bear weight last night when she tried to use the bathroom.  She denies any particular joint swellings although she does have chronic arthritis and joint pains.  Patient takes allopurinol daily for gout prevention.  She is on chronic diuretics for chronic venous stasis edema and diastolic CHF and on Midodrin for orthostasis.  She denies any cough or sick contacts.  She did not get her flu shot this year.  She denies any abdominal pain or diarrhea although reports feeling nauseous in the ED after antibiotics.  She states she felt somewhat dyspneic on laying down last night but feels okay now.  She denies any dysuria or hematuria.  She was febrile in the ED with T-max of 103.  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  1.  Streptococcus agalactiae bacteremia -Etiology is suspected to be early left thigh cellulitis-patient did report some thigh tenderness 3 days ago, exam unimpressive  at this time -Repeat cultures negative x 72 hours -2D echocardiogram-without evidence of endocarditis, h/o AVR -Infectious disease consult appreciated, TEE recommended, was performed 06/02/2018, no evidence of vegetation, only significant for previously known severe tricuspid regurgitation -Currently on IV penicillin day 4, recommendation to continue treatment through 06/26/2018, will follow with ED in 2 to 3 weeks from discharge  2.  Chronic orthostatic hypotension -on low dose midodrine at baseline, resumed  3.  Chronic diastolic CHF -she is on spironolactone and Demadex at baseline -euvolemic, resumed Aldactone and Demadex   4.  H/o severe aortic stenosis  -status post porcine aortic valve replacement  5.  Acute kidney injury and chronic kidney disease 2 -resumed spironolactone and Demadex at lower dose -Creatinine improved   6.  Chronic atrial fibrillation -Stable, continue Eliquis   Discharge Condition: stable   Follow UP  Follow-up Information    [facility].   Specialty:  Home Health Services Why:  home health services arranged Contact information: [address] [place] [address] [phone]        [facility] - Dme .   Contact information: [place] [address] [phone]             Discharge Instructions  and  Discharge Medications    Discharge Instructions    Diet - low sodium heart healthy   Complete by:  As directed    Home infusion instructions [facility] May follow [place] Dosing Protocol; May administer Cathflo as needed to maintain patency of vascular access device.; Flushing of vascular access device: per [facility] Protocol: 0.9% NaCl pre/post medica...   Complete by:  As directed    Instructions:  May follow [place] Dosing Protocol   Instructions:  May administer Cathflo as needed to maintain patency of vascular access device.   Instructions:   Flushing of vascular access device: per [facility] Protocol: 0.9% NaCl pre/post medication administration and prn patency; Heparin 100 u/ml, 21m for implanted ports and Heparin 10u/ml, 545mfor all other central venous catheters.   Instructions:  May follow [facility] Anaphylaxis Protocol for First Dose Administration in the home: 0.9% NaCl at 25-50 ml/hr to maintain IV access for protocol meds. Epinephrine 0.3 ml IV/IM PRN and Benadryl 25-50 IV/IM PRN s/s of anaphylaxis.   Instructions:  [facility] Coordinator (RN) to assist per patient IV care needs in the home PRN.   Increase activity slowly   Complete by:  As directed      Allergies as of 06/03/2018      Reactions   No Known Allergies       Medication List    TAKE these medications   allopurinol 100 MG tablet Commonly known as:  ZYLOPRIM TAKE 1 TABLET BY MOUTH EVERY DAY   apixaban 2.5 MG Tabs tablet Commonly known as:  ELIQUIS Take 1 tablet (2.5 mg total) by mouth 2 (two) times daily.   aspirin EC 81 MG tablet Take 81 mg by mouth daily.   cyanocobalamin 1000 MCG/ML injection Commonly known as:  (VITAMIN B-12) Inject 1 mL (1,000 mcg total) into the muscle every 30 (thirty) days.   ferrous sulfate 325 (65 FE) MG tablet Take 1 tablet (325 mg total) by mouth 2 (two) times daily with a meal.   fluticasone 50 MCG/ACT nasal spray Commonly known as:  FLONASE PLACE 1 SPRAY INTO BOTH NOSTRILS DAILY.   levothyroxine 175 MCG tablet Commonly known as:  SYNTHROID, LEVOTHROID Take 1 tablet (175 mcg total) by mouth daily.   Magnesium 400 MG Caps Take 400 mg by mouth daily.   metolazone 2.5 MG tablet Commonly known as:  ZAROXOLYN Take 1 tablet (2.5 mg total) by mouth daily as needed. (For weight gain more than 255 pounds - fluid/weight gain)   midodrine 2.5 MG tablet Commonly known as:  PROAMATINE Take 1 tablet (2.5 mg total) by mouth 3 (three) times daily with meals.   pantoprazole 40 MG tablet Commonly known as:   PROTONIX Take 1 tablet (40 mg total) by mouth daily.   penicillin G  IVPB Inject 24 Million Units into the vein daily for 24 days. As a continuous infusion. Indication:  Strep Agalactiae bacteremia in the setting of a porcine aortic valve Last Day of Therapy:  06/26/18 Labs - Once weekly:  CBC/D and BMP, Labs - Every other week:  ESR  and CRP   penicillin G potassium 4 Million Units in dextrose 5 % 250 mL Inject 4 Million Units into the vein every 4 (four) hours for 23 days.   potassium chloride SA 20 MEQ tablet Commonly known as:  K-DUR,KLOR-CON Take 2 tablets (40 mEq total) by mouth daily.   spironolactone 25 MG tablet Commonly known as:  ALDACTONE Take 1 tablet (25 mg total) by mouth daily.   torsemide 20 MG tablet Commonly known as:  DEMADEX Take 3 tablets (60 mg total) by mouth 2 (two) times daily.            Home Infusion Instuctions  (From admission, onward)         Start     Ordered   06/03/18 0000  Home infusion instructions [facility] May follow [place] Dosing Protocol; May administer Cathflo as needed to maintain patency of vascular access device.; Flushing of vascular access device: per [facility] Protocol: 0.9% NaCl pre/post medica...    Question Answer Comment  Instructions May follow [place] Dosing Protocol   Instructions May administer Cathflo as needed to maintain patency of vascular access device.   Instructions Flushing of vascular access device: per [facility] Protocol: 0.9% NaCl pre/post medication administration and prn patency; Heparin 100 u/ml, 39m for implanted ports and Heparin 10u/ml, 581mfor all other central venous catheters.   Instructions May follow [facility] Anaphylaxis Protocol for First Dose Administration in the home: 0.9% NaCl at 25-50 ml/hr to maintain IV access for protocol meds. Epinephrine 0.3 ml IV/IM PRN and Benadryl 25-50 IV/IM PRN s/s of anaphylaxis.   Instructions [facility] Infusion Coordinator (RN) to assist per patient IV  care needs in the home PRN.      06/03/18 1129            Diet and Activity recommendation: See Discharge Instructions above   Consults obtained -  Infectious disease   Major procedures and Radiology Reports - PLEASE review detailed and final reports for all details, in brief -   TEE 06/02/2018   Dg Chest 2 View  Result Date: 05/29/2018 CLINICAL DATA:  [age]ear old female with sepsis. EXAM: CHEST - 2 VIEW COMPARISON:  Chest radiograph dated 12/31/2017 FINDINGS: There is stable cardiomegaly. There is minimal vascular congestion. No edema. No focal consolidation or pneumothorax. No pleural effusion. Median sternotomy wires. No acute osseous pathology. IMPRESSION: Cardiomegaly with minimal vascular congestion. No edema or focal consolidation. Electronically Signed   By: ArAnner Crete.D.   On: 05/29/2018 05:25   [place] Site Rite  Result Date: 06/02/2018 If Site Rite image not attached, placement could not be confirmed due to current cardiac rhythm.   Micro Results    Recent Results (from the past 240 hour(s))  Culture, blood (Routine x 2)     Status: Abnormal   Collection Time: 05/29/18  1:50 AM  Result Value Ref Range Status   Specimen Description BLOOD RIGHT ANTECUBITAL  Final   Special Requests   Final    BOTTLES DRAWN AEROBIC AND ANAEROBIC Blood Culture adequate volume   Culture  Setup Time   Final    GRAM POSITIVE COCCI IN CHAINS BLOOD AEROBIC BOTTLE CRITICAL RESULT CALLED TO, READ BACK BY AND VERIFIED WITH: PHARMD ERIN D [id number] Performed at [facility] [place] [id number]  Culture GROUP B STREP(S.AGALACTIAE)ISOLATED (A)  Final   Report Status 05/31/2018 FINAL  Final   Organism ID, Bacteria GROUP B  STREP(S.AGALACTIAE)ISOLATED  Final      Susceptibility   Group b strep(s.agalactiae)isolated - MIC*    CLINDAMYCIN <=0.25 SENSITIVE Sensitive     AMPICILLIN <=0.25 SENSITIVE Sensitive     ERYTHROMYCIN <=0.12 SENSITIVE  Sensitive     VANCOMYCIN 0.5 SENSITIVE Sensitive     CEFTRIAXONE <=0.12 SENSITIVE Sensitive     LEVOFLOXACIN 1 SENSITIVE Sensitive     PENICILLIN Value in next row Sensitive      SENSITIVE<=0.06    * GROUP B STREP(S.AGALACTIAE)ISOLATED  Blood Culture ID Panel (Reflexed)     Status: Abnormal   Collection Time: 05/29/18  1:50 AM  Result Value Ref Range Status   Enterococcus species NOT DETECTED NOT DETECTED Final   Listeria monocytogenes NOT DETECTED NOT DETECTED Final   Staphylococcus species NOT DETECTED NOT DETECTED Final   Staphylococcus aureus (BCID) NOT DETECTED NOT DETECTED Final   Streptococcus species DETECTED (A) NOT DETECTED Final    Comment: CRITICAL RESULT CALLED TO, READ BACK BY AND VERIFIED WITH: PHARMD ERIN D [id number] FCP    Streptococcus agalactiae DETECTED (A) NOT DETECTED Final    Comment: CRITICAL RESULT CALLED TO, READ BACK BY AND VERIFIED WITH: PHARMD ERIN D [id number] FCP    Streptococcus pneumoniae NOT DETECTED NOT DETECTED Final   Streptococcus pyogenes NOT DETECTED NOT DETECTED Final   Acinetobacter baumannii NOT DETECTED NOT DETECTED Final   Enterobacteriaceae species NOT DETECTED NOT DETECTED Final   Enterobacter cloacae complex NOT DETECTED NOT DETECTED Final   Escherichia coli NOT DETECTED NOT DETECTED Final   Klebsiella oxytoca NOT DETECTED NOT DETECTED Final   Klebsiella pneumoniae NOT DETECTED NOT DETECTED Final   Proteus species NOT DETECTED NOT DETECTED Final   Serratia marcescens NOT DETECTED NOT DETECTED Final   Haemophilus influenzae NOT DETECTED NOT DETECTED Final   Neisseria meningitidis NOT DETECTED NOT DETECTED Final   Pseudomonas aeruginosa NOT DETECTED NOT DETECTED Final   Candida albicans NOT DETECTED NOT DETECTED Final   Candida glabrata NOT DETECTED NOT DETECTED Final   Candida krusei NOT DETECTED NOT DETECTED Final   Candida parapsilosis NOT DETECTED NOT DETECTED Final   Candida tropicalis NOT DETECTED NOT DETECTED Final     Comment: Performed at [facility], [address], [place], [place] [address]  Culture, blood (Routine x 2)     Status: Abnormal   Collection Time: 05/29/18  2:00 AM  Result Value Ref Range Status   Specimen Description BLOOD LEFT ANTECUBITAL  Final   Special Requests   Final    BOTTLES DRAWN AEROBIC AND ANAEROBIC Blood Culture adequate volume   Culture  Setup Time   Final    GRAM POSITIVE COCCI IN CHAINS IN BOTH AEROBIC AND ANAEROBIC BOTTLES CRITICAL VALUE NOTED.  VALUE IS CONSISTENT WITH PREVIOUSLY REPORTED AND CALLED VALUE.    Culture (A)  Final    GROUP B STREP(S.AGALACTIAE)ISOLATED SUSCEPTIBILITIES PERFORMED ON PREVIOUS CULTURE WITHIN THE LAST 5 DAYS. Performed at [facility], [place] [address], [place], [place] [address]    Report Status 05/31/2018 FINAL  Final  Culture, blood (routine x 2)     Status: None (Preliminary result)   Collection Time: 05/30/18 10:54 AM  Result Value Ref Range Status   Specimen Description BLOOD RIGHT HAND  Final   Special Requests   Final    BOTTLES DRAWN AEROBIC ONLY Blood Culture adequate volume   Culture   Final    NO GROWTH 4 DAYS Performed at [facility], [address] [place], [place] [address]    Report Status PENDING  Incomplete  Culture, blood (routine x 2)     Status: None (Preliminary result)   Collection Time: 05/30/18 11:03 AM  Result Value Ref Range Status   Specimen Description BLOOD LEFT HAND  Final   Special Requests   Final    BOTTLES DRAWN AEROBIC ONLY Blood Culture adequate volume   Culture   Final    NO GROWTH 4 DAYS Performed at [facility], [place] [address] [place],  [address]    Report Status PENDING  Incomplete       Today   Subjective:   Christine Hicks today has no headache,no chest abdominal pain,no new weakness tingling or numbness, feels much better wants to go home today.   Objective:   Blood pressure (!) 118/58, pulse 72, temperature 98.7 F (37.1 C), temperature  source Oral, resp. rate 18, height 5' 5"  (1.651 m), weight 103.9 kg, SpO2 98 %.   Intake/Output Summary (Last 24 hours) at 06/03/2018 1607 Last data filed at 06/03/2018 1500 Gross per 24 hour  Intake 580 ml  Output 1900 ml  Net -1320 ml    Exam Awake Alert, Oriented x 3, No new F.N deficits, Normal affect Symmetrical Chest wall movement, Good air movement bilaterally, CTAB RRR,No Gallops,Rubs, + murmur, No Parasternal Heave +ve B.Sounds, Abd Soft, Non tender,No rebound -guarding or rigidity. No Cyanosis, Clubbing or edema, No new Rash or bruise, patient with hyperpigmentation in lower extremity, with large varicose veins,  Data Review   CBC w Diff:  Lab Results  Component Value Date   WBC 7.6 06/03/2018   HGB 11.9 (L) 06/03/2018   HGB 12.9 01/26/2017   HCT 38.1 06/03/2018   HCT 40.9 01/26/2017   PLT 178 06/03/2018   PLT 252 01/26/2017   LYMPHOPCT 8 05/29/2018   MONOPCT 5 05/29/2018   EOSPCT 1 05/29/2018   BASOPCT 0 05/29/2018    CMP:  Lab Results  Component Value Date   NA 135 06/03/2018   NA 139 03/10/2018   K 3.6 06/03/2018   CL 101 06/03/2018   CO2 25 06/03/2018   BUN 14 06/03/2018   BUN 19 03/10/2018   CREATININE 0.90 06/03/2018   CREATININE 0.93 12/04/2015   PROT 8.0 05/29/2018   ALBUMIN 4.1 05/29/2018   BILITOT 1.5 (H) 05/29/2018   ALKPHOS 109 05/29/2018   AST 34 05/29/2018   ALT 17 05/29/2018  .   Total Time in preparing paper work, data evaluation and todays exam - 82 minutes  Phillips Climes M.D on 06/03/2018 at 4:07 PM  [facility]   Office  [phone]

## 2018-06-04 LAB — CULTURE, BLOOD (ROUTINE X 2)
CULTURE: NO GROWTH
Culture: NO GROWTH
SPECIAL REQUESTS: ADEQUATE
SPECIAL REQUESTS: ADEQUATE

## 2018-06-05 ENCOUNTER — Encounter (HOSPITAL_COMMUNITY): Payer: Self-pay | Admitting: Cardiology

## 2018-06-13 ENCOUNTER — Other Ambulatory Visit (HOSPITAL_COMMUNITY): Payer: Self-pay | Admitting: Adult Health

## 2018-06-16 ENCOUNTER — Ambulatory Visit (HOSPITAL_COMMUNITY)
Admission: RE | Admit: 2018-06-16 | Discharge: 2018-06-16 | Disposition: A | Discharge status: Home / Self Care | Payer: BC Managed Care – PPO | Source: Ambulatory Visit | Attending: Internal Medicine | Admitting: Internal Medicine

## 2018-06-16 ENCOUNTER — Other Ambulatory Visit: Payer: Self-pay

## 2018-06-16 ENCOUNTER — Encounter (HOSPITAL_COMMUNITY): Payer: Self-pay

## 2018-06-16 VITALS — BP 106/72 | HR 69 | Wt 217.0 lb

## 2018-06-16 DIAGNOSIS — Z79890 Hormone replacement therapy (postmenopausal): Secondary | ICD-10-CM | POA: Diagnosis not present

## 2018-06-16 DIAGNOSIS — I272 Pulmonary hypertension, unspecified: Secondary | ICD-10-CM

## 2018-06-16 DIAGNOSIS — G4700 Insomnia, unspecified: Secondary | ICD-10-CM | POA: Insufficient documentation

## 2018-06-16 DIAGNOSIS — Z7901 Long term (current) use of anticoagulants: Secondary | ICD-10-CM | POA: Diagnosis not present

## 2018-06-16 DIAGNOSIS — R7881 Bacteremia: Secondary | ICD-10-CM | POA: Insufficient documentation

## 2018-06-16 DIAGNOSIS — Z86711 Personal history of pulmonary embolism: Secondary | ICD-10-CM | POA: Diagnosis not present

## 2018-06-16 DIAGNOSIS — Z7982 Long term (current) use of aspirin: Secondary | ICD-10-CM | POA: Insufficient documentation

## 2018-06-16 DIAGNOSIS — Z6841 Body Mass Index (BMI) 40.0 and over, adult: Secondary | ICD-10-CM | POA: Insufficient documentation

## 2018-06-16 DIAGNOSIS — G4733 Obstructive sleep apnea (adult) (pediatric): Secondary | ICD-10-CM | POA: Diagnosis not present

## 2018-06-16 DIAGNOSIS — Z953 Presence of xenogenic heart valve: Secondary | ICD-10-CM | POA: Diagnosis not present

## 2018-06-16 DIAGNOSIS — Z86718 Personal history of other venous thrombosis and embolism: Secondary | ICD-10-CM | POA: Insufficient documentation

## 2018-06-16 DIAGNOSIS — Z8249 Family history of ischemic heart disease and other diseases of the circulatory system: Secondary | ICD-10-CM | POA: Diagnosis not present

## 2018-06-16 DIAGNOSIS — E039 Hypothyroidism, unspecified: Secondary | ICD-10-CM | POA: Diagnosis not present

## 2018-06-16 DIAGNOSIS — I10 Essential (primary) hypertension: Secondary | ICD-10-CM

## 2018-06-16 DIAGNOSIS — I5033 Acute on chronic diastolic (congestive) heart failure: Secondary | ICD-10-CM | POA: Diagnosis not present

## 2018-06-16 DIAGNOSIS — D509 Iron deficiency anemia, unspecified: Secondary | ICD-10-CM | POA: Insufficient documentation

## 2018-06-16 DIAGNOSIS — I110 Hypertensive heart disease with heart failure: Secondary | ICD-10-CM | POA: Insufficient documentation

## 2018-06-16 DIAGNOSIS — Z87891 Personal history of nicotine dependence: Secondary | ICD-10-CM | POA: Diagnosis not present

## 2018-06-16 DIAGNOSIS — K219 Gastro-esophageal reflux disease without esophagitis: Secondary | ICD-10-CM | POA: Insufficient documentation

## 2018-06-16 DIAGNOSIS — I5032 Chronic diastolic (congestive) heart failure: Secondary | ICD-10-CM | POA: Insufficient documentation

## 2018-06-16 DIAGNOSIS — Z954 Presence of other heart-valve replacement: Secondary | ICD-10-CM

## 2018-06-16 LAB — BASIC METABOLIC PANEL
Anion gap: 10 (ref 5–15)
BUN: 19 mg/dL (ref 8–23)
CALCIUM: 9.8 mg/dL (ref 8.9–10.3)
CO2: 25 mmol/L (ref 22–32)
Chloride: 103 mmol/L (ref 98–111)
Creatinine, Ser: 1.19 mg/dL — ABNORMAL HIGH (ref 0.44–1.00)
GFR calc non Af Amer: 48 mL/min — ABNORMAL LOW (ref >60)
GFR, EST AFRICAN AMERICAN: 55 mL/min — AB (ref >60)
Glucose, Bld: 108 mg/dL — ABNORMAL HIGH (ref 70–99)
Potassium: 3.9 mmol/L (ref 3.5–5.1)
SODIUM: 138 mmol/L (ref 135–145)

## 2018-06-16 NOTE — Patient Instructions (Signed)
Labs today We will only contact you if something comes back abnormal or we need to make some changes. Otherwise no news is good news!  Your physician recommends that you schedule a follow-up appointment in: 3-4 months with Dr. Gala RomneyBensimhon

## 2018-06-16 NOTE — Progress Notes (Signed)
PCP: Dr Maryellen Pile  Primary HF Cardiologist: Dr Haroldine Laws Primary Cardiologist: Dr Tamala Julian.    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   Admitted 4/15 through 5/72/62 with a/c diastolic heart failure. Prior to admit she was n Diuresed 28 liters. Discharged on torsemide 40 mg twice a day. Discharge weight 257 pounds.   She was evaluated by Truitt Merle on 12/01/2017. At that time she was stable and continued on torsemide 40 mg twice a day.   Admitted 12/31/2017 with volume overload. Diuresed with IV lasix and transitioned to torsemide 60 mg twice a day. Diuresed 40 pounds. RHC was performed and showed preserved EF and severely dilated left atrium. Discharge weight 244 pounds.   Admitted 11/2 - 06/03/18 with fever. Found to have streptococcal agalactiae  bacteremia and started on IV ABX to finish 06/26/18. Following with ID. Thought to be secondary to left thigh cellulitis.   She presents today for post hospital follow up. Weight down 15 lbs since last visit. Has been dieting, watching carbs and sugars, as well as fluid intake. Denies lightheadedness or dizziness. Managing picc well. No erythema, discharge, or trauma to site. Weight at home ~215 lbs. Graduated from paramedicine, able to fix her own pill box now. Taking all medication as directed. Her PCP has changed their visit model (Now only home visits), and she needs a new PCP.   01/01/2018 ECHO Left ventricle: The cavity size was normal. Systolic function was   vigorous. The estimated ejection fraction was in the range of 65%   to 70%. Wall motion was normal; there were no regional wall   motion abnormalities. There was no evidence of elevated   ventricular filling pressure by Doppler parameters. - Right atrium: The atrium was severely dilated. -  Tricuspid valve: There was severe regurgitation. - Pulmonary arteries: Systolic pressure was mildly increased. PA   peak pressure: 39 mm Hg (S). [place] 12/2017  RA = 12 RV = 32/11 PA = 33/14 (23) PCW = 12 Fick cardiac output/index = 6.37/2.9 PVR = 1.4 WU Ao sat = 98% PA sat = 71%, 73% SVC sat = 67% Assessment: Volume status much improved after marked diuresis. Has evidence of mildly elevated R-sided pressures with normal pulmonary pressures c/w RV dysfunction. Left-sided pressures normal.   Review of systems complete and found to be negative unless listed in HPI.    SH:  Social History   Socioeconomic History  . Marital status: Significant Other    Spouse name: Not on file  . Number of children: Not on file  . Years of education: Not on file  . Highest education level: Not on file  Occupational History  . Occupation: retired  Scientific laboratory technician  . Financial resource strain: Not on file  . Food insecurity:    Worry: Not on file    Inability: Not on file  . Transportation needs:    Medical: Not on file    Non-medical: Not on file  Tobacco Use  . Smoking status: Former Smoker    Types: Cigarettes  . Smokeless tobacco: Never Used  . Tobacco comment: quit smoking cigarettes > 25 years ago  Substance and Sexual Activity  . Alcohol use: No  . Drug use: No  . Sexual activity: Yes  Lifestyle  . Physical activity:    Days per  week: Not on file    Minutes per session: Not on file  . Stress: Not on file  Relationships  . Social connections:    Talks on phone: Not on file    Gets together: Not on file    Attends religious service: Not on file    Active member of club or organization: Not on file    Attends meetings of clubs or organizations: Not on file    Relationship status: Not on file  . Intimate partner violence:    Fear of current or ex partner: Not on file    Emotionally abused: Not on file    Physically abused: Not on file    Forced sexual activity: Not on file    Other Topics Concern  . Not on file  Social History Narrative  . Not on file    FH:  Family History  Problem Relation Age of Onset  . Cardiomyopathy Son   . Heart attack Mother   . Emphysema Father   . Lung cancer Maternal Aunt     Past Medical History:  Diagnosis Date  . Acute on chronic diastolic congestive heart failure ([place])   . Anemia, iron deficiency    negative egd/colonoscopy 11/16/2015  . Aortic stenosis, severe   . Arthritis   . Bicuspid aortic valve   . DVT (deep venous thrombosis) ([place])    RLE DVT 11/12/15  . GERD (gastroesophageal reflux disease)   . Heart murmur   . Hypertension   . Insomnia   . Morbid obesity with BMI of 45.0-49.9, adult ([place])   . PONV (postoperative nausea and vomiting)    took a long time to wake up  . 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 Thyroid disease   . Wears glasses   . Wears partial dentures     Current Outpatient Medications  Medication Sig Dispense Refill  . acetaminophen (TYLENOL) 500 MG tablet Take 500 mg by mouth every 6 (six) hours as needed for mild pain.    Marland Kitchen allopurinol (ZYLOPRIM) 100 MG tablet TAKE 1 TABLET BY MOUTH EVERY DAY (Patient taking differently: Take 100 mg by mouth daily. ) 90 tablet 2  . apixaban (ELIQUIS) 2.5 MG TABS tablet Take 1 tablet (2.5 mg total) by mouth 2 (two) times daily. 60 tablet 3  . aspirin EC 81 MG tablet Take 81 mg by mouth daily.    . ferrous sulfate 325 (65 FE) MG tablet Take 1 tablet (325 mg total) by mouth 2 (two) times daily with a meal. 60 tablet 3  . fluticasone (FLONASE) 50 MCG/ACT nasal spray PLACE 1 SPRAY INTO BOTH NOSTRILS DAILY. 16 g 0  . levothyroxine (SYNTHROID, LEVOTHROID) 175 MCG tablet Take 1 tablet (175 mcg total) by mouth daily. 30 tablet 3  . Magnesium 400 MG CAPS  Take 400 mg by mouth daily. 30 capsule 3  . Melatonin 3 MG TABS Take 3 mg by mouth at bedtime.    . metolazone (ZAROXOLYN) 2.5 MG tablet Take 1 tablet (2.5 mg total) by mouth daily as needed. (For weight gain more than 255 pounds - fluid/weight gain) 30 tablet 1  . midodrine (PROAMATINE) 2.5 MG tablet Take 1 tablet (2.5 mg total) by mouth 3 (three) times daily with meals. 90 tablet 3  . pantoprazole (PROTONIX) 40  MG tablet Take 1 tablet (40 mg total) by mouth daily. 90 tablet 3  . penicillin G IVPB Inject 24 Million Units into the vein daily for 24 days. As a continuous infusion. Indication:  Strep Agalactiae bacteremia in the setting of a porcine aortic valve Last Day of Therapy:  06/26/18 Labs - Once weekly:  CBC/D and BMP, Labs - Every other week:  ESR and CRP 24 Units 0  . penicillin G potassium 4 Million Units in dextrose 5 % 250 mL Inject 4 Million Units into the vein every 4 (four) hours for 23 days. 138 Dose 0  . potassium chloride SA (K-DUR,KLOR-CON) 20 MEQ tablet Take 2 tablets (40 mEq total) by mouth daily. 60 tablet 3  . spironolactone (ALDACTONE) 25 MG tablet Take 1 tablet (25 mg total) by mouth daily. 30 tablet 3  . torsemide (DEMADEX) 20 MG tablet Take 3 tablets (60 mg total) by mouth 2 (two) times daily. 180 tablet 3  . cyanocobalamin (,VITAMIN B-12,) 1000 MCG/ML injection Inject 1 mL (1,000 mcg total) into the muscle every 30 (thirty) days. (Patient not taking: Reported on 06/16/2018) 1 mL 0   No current facility-administered medications for this encounter.    Vitals:   06/16/18 1007  BP: 106/72  Pulse: 69  SpO2: 99%  Weight: 98.4 kg (217 lb)   Wt Readings from Last 3 Encounters:  06/16/18 98.4 kg (217 lb)  05/29/18 103.9 kg (229 lb 0.9 oz)  03/16/18 105.5 kg (232 lb 9.6 oz)   PHYSICAL EXAM: General: NAD. Arrived in [facility].  HEENT: Normal Neck: Supple. JVP 6-7 cm. Carotids 2+ bilat; no bruits. No thyromegaly or nodule noted. Cor: PMI nondisplaced. RRR, No M/G/R  noted Lungs: CTAB, normal effort. Abdomen: Soft, non-tender, non-distended, no HSM. No bruits or masses. +BS  Extremities: No cyanosis, clubbing, or rash. R and LLE no edema. RUE PICC Neuro: Alert & orientedx3, cranial nerves grossly intact. moves all 4 extremities w/o difficulty. Affect pleasant   ASSESSMENT & PLAN: 1. Chronic Diastolic Heart Failure. 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  - NYHA II-III symptoms - Volume status stable on exam. - Continue torsemide 60 mg BID for now. If continues to lose weight, may be able to decrease dose. - SBPs typically run 90-100s. Continue midodrine.    2. Suspected OSA - Sleep study completed 12/29/2017. No results in epic but pt had good sleep. Not on CPAP at this time.   3. Morbid Obesity - Body mass index is 36.11 kg/m.' - She is doing much better with diet and portion control.   4. H/O DVT/PE - Continue eliquis 2.5 mg BID per Amplify EXT trial  - Denies bleeding.   5. H/Oof Congenital Bicuspid Valve w/ Severe Stenosis: - s/p bioprosthetic aortic valve replacement with aortic root placement, per Dr. Roxy Manns June 2018.  - No change to current plan.   - TEE 06/02/18 with stable valve and no evidence of endocarditis.   6. Bacteremia - On IV ABX until 06/26/18. Has ID follow up 06/23/18. - PICC site stable.   7. PCP - HFSW to see today. I've told her that we can give short prescriptions for non-cardiac meds as needed to help bridge the gap until follow up can be made.   BMET today with recent AKI, but suspect it was ATN in setting of sepsis.  RTC 3-4 months with MD. Consider graduation to go back to following  with Dr. Tamala Julian at that time, pt aware and agrees.   Christine Friar, Christine Hicks  10:19 AM   Greater than 50% of the 30 minute visit was spent in counseling/coordination of care regarding disease state education, salt/fluid restriction,  sliding scale diuretics, and medication compliance.

## 2018-06-23 ENCOUNTER — Encounter: Payer: Self-pay | Admitting: Internal Medicine

## 2018-06-23 ENCOUNTER — Ambulatory Visit (INDEPENDENT_AMBULATORY_CARE_PROVIDER_SITE_OTHER): Payer: BC Managed Care – PPO | Admitting: Internal Medicine

## 2018-06-23 VITALS — BP 124/77 | HR 80 | Temp 98.6°F | Wt 212.0 lb

## 2018-06-23 DIAGNOSIS — Z5181 Encounter for therapeutic drug level monitoring: Secondary | ICD-10-CM

## 2018-06-23 DIAGNOSIS — B955 Unspecified streptococcus as the cause of diseases classified elsewhere: Secondary | ICD-10-CM

## 2018-06-23 DIAGNOSIS — R7881 Bacteremia: Secondary | ICD-10-CM | POA: Diagnosis not present

## 2018-06-23 DIAGNOSIS — Z452 Encounter for adjustment and management of vascular access device: Secondary | ICD-10-CM | POA: Diagnosis not present

## 2018-06-23 NOTE — Assessment & Plan Note (Signed)
Doing well and fortunately no endocarditis.  Completing 4 weeks I Vantiboitcs and will stop. Will get surveillance blood cultures 1 week after completion.  Return as needed

## 2018-06-23 NOTE — Progress Notes (Signed)
   Subjective:    Patient ID: Archie PattenJudith M Risby, female    DOB: 10/30/1955, 62 y.o.   MRN: [id number]  HPI Here for follow up of recent hospitalization. Has a history of a porcine AVR and admitted for fever and Group B Strep bacteremia.  Underwent TEE and no vegetation noted.  Started on penicillin and getting for 4 weeks through November 30th.  No issues today and no associated rash, diarrhea.  No fever or chills.     Review of Systems  Constitutional: Negative for chills and fever.  Skin: Negative for rash.  Neurological: Negative for headaches.       Objective:   Physical Exam  Constitutional: She appears well-developed and well-nourished. No distress.  Eyes: No scleral icterus.  Cardiovascular: Normal rate, regular rhythm and normal heart sounds.  Pulmonary/Chest: Effort normal and breath sounds normal. No respiratory distress.  Skin: No rash noted.    SH: no tobacco      Assessment & Plan:

## 2018-06-23 NOTE — Assessment & Plan Note (Signed)
Creat has remained stable, around baseline

## 2018-06-23 NOTE — Assessment & Plan Note (Signed)
No issues with picc line. [facility]

## 2018-07-06 ENCOUNTER — Other Ambulatory Visit: Payer: BC Managed Care – PPO

## 2018-07-06 DIAGNOSIS — B955 Unspecified streptococcus as the cause of diseases classified elsewhere: Secondary | ICD-10-CM

## 2018-07-06 DIAGNOSIS — R7881 Bacteremia: Principal | ICD-10-CM

## 2018-07-12 ENCOUNTER — Telehealth: Payer: Self-pay

## 2018-07-12 LAB — CULTURE, BLOOD (SINGLE)
MICRO NUMBER:: 91477392
MICRO NUMBER:: 91477393

## 2018-07-12 NOTE — Telephone Encounter (Signed)
Attempted to call patient with blood culture results. Unable to reach patient at this time. Left message for patient to call office back. Lorenso CourierJose L Maldonado, [place]

## 2018-07-12 NOTE — Telephone Encounter (Signed)
-----   Message from Gardiner Barefootobert W Comer, MD sent at 07/12/2018 10:27 AM EST ----- Please let her know her blood cultures remained negative, no issues.  thanks

## 2018-07-29 ENCOUNTER — Other Ambulatory Visit (HOSPITAL_COMMUNITY): Payer: Self-pay | Admitting: Adult Health

## 2018-08-27 ENCOUNTER — Other Ambulatory Visit (HOSPITAL_COMMUNITY): Payer: Self-pay | Admitting: Adult Health

## 2018-09-17 ENCOUNTER — Encounter (HOSPITAL_COMMUNITY): Payer: Self-pay | Admitting: Internal Medicine

## 2018-09-18 ENCOUNTER — Other Ambulatory Visit (HOSPITAL_COMMUNITY): Payer: Self-pay | Admitting: Adult Health

## 2018-10-14 ENCOUNTER — Other Ambulatory Visit (HOSPITAL_COMMUNITY): Payer: Self-pay | Admitting: Adult Health

## 2018-10-15 ENCOUNTER — Other Ambulatory Visit (HOSPITAL_COMMUNITY): Payer: Self-pay | Admitting: Adult Health

## 2018-11-10 ENCOUNTER — Other Ambulatory Visit (HOSPITAL_COMMUNITY): Payer: Self-pay | Admitting: Adult Health

## 2018-12-12 ENCOUNTER — Other Ambulatory Visit (HOSPITAL_COMMUNITY): Payer: Self-pay | Admitting: Internal Medicine

## 2018-12-12 ENCOUNTER — Other Ambulatory Visit (HOSPITAL_COMMUNITY): Payer: Self-pay | Admitting: Adult Health

## 2018-12-17 ENCOUNTER — Other Ambulatory Visit (HOSPITAL_COMMUNITY): Payer: Self-pay | Admitting: Adult Health

## 2018-12-31 ENCOUNTER — Other Ambulatory Visit (HOSPITAL_COMMUNITY): Payer: Self-pay | Admitting: Internal Medicine

## 2019-01-09 ENCOUNTER — Other Ambulatory Visit (HOSPITAL_COMMUNITY): Payer: Self-pay | Admitting: Internal Medicine

## 2019-01-15 ENCOUNTER — Other Ambulatory Visit: Payer: Self-pay | Admitting: Interventional Cardiology

## 2019-01-19 NOTE — Telephone Encounter (Signed)
Should get from PCP.  Thanks

## 2019-01-19 NOTE — Telephone Encounter (Signed)
I see this was filled in the hospital last. Ok to fill? Or does she need to contact PCP?

## 2019-01-23 ENCOUNTER — Other Ambulatory Visit (HOSPITAL_COMMUNITY): Payer: Self-pay | Admitting: Internal Medicine

## 2019-02-05 ENCOUNTER — Other Ambulatory Visit: Payer: Self-pay | Admitting: Interventional Cardiology

## 2019-02-05 ENCOUNTER — Other Ambulatory Visit (HOSPITAL_COMMUNITY): Payer: Self-pay | Admitting: Cardiology

## 2019-02-09 ENCOUNTER — Other Ambulatory Visit (HOSPITAL_COMMUNITY): Payer: Self-pay | Admitting: Adult Health

## 2019-02-11 ENCOUNTER — Other Ambulatory Visit: Payer: Self-pay | Admitting: Interventional Cardiology

## 2019-02-19 ENCOUNTER — Other Ambulatory Visit (HOSPITAL_COMMUNITY): Payer: Self-pay | Admitting: Internal Medicine

## 2019-02-26 ENCOUNTER — Other Ambulatory Visit (HOSPITAL_COMMUNITY): Payer: Self-pay | Admitting: Adult Health

## 2019-02-26 ENCOUNTER — Other Ambulatory Visit (HOSPITAL_COMMUNITY): Payer: Self-pay | Admitting: Internal Medicine

## 2019-03-07 ENCOUNTER — Ambulatory Visit (HOSPITAL_COMMUNITY)
Admission: RE | Admit: 2019-03-07 | Discharge: 2019-03-07 | Disposition: A | Discharge status: Home / Self Care | Payer: Medicare Other | Source: Ambulatory Visit | Attending: Internal Medicine | Admitting: Internal Medicine

## 2019-03-07 ENCOUNTER — Other Ambulatory Visit: Payer: Self-pay

## 2019-03-07 DIAGNOSIS — I5032 Chronic diastolic (congestive) heart failure: Secondary | ICD-10-CM | POA: Diagnosis not present

## 2019-03-07 DIAGNOSIS — Z952 Presence of prosthetic heart valve: Secondary | ICD-10-CM

## 2019-03-07 DIAGNOSIS — E038 Other specified hypothyroidism: Secondary | ICD-10-CM

## 2019-03-07 DIAGNOSIS — IMO0001 Reserved for inherently not codable concepts without codable children: Secondary | ICD-10-CM

## 2019-03-07 NOTE — Progress Notes (Signed)
1. Labs CBC, CMET, TSH, FT4, FT3  2. Clinic 6-9 months    Orders placed per Dr Haroldine Laws, AVS mailed to pt, message sent to schedulers to arrange lab work.

## 2019-03-07 NOTE — Patient Instructions (Signed)
You need lab work in our office soon, our office will call you to schedule this  We will contact you in 6 months to schedule your next appointment.  If you have any questions or concerns before your next appointment please send [place] a message through [place] or call our office at [phone].  At the [facility], you and your health needs are our priority. As part of our continuing mission to provide you with exceptional heart care, we have created designated Provider Care Teams. These Care Teams include your primary Cardiologist (physician) and Advanced Practice Providers (APPs- Physician Assistants and Nurse Practitioners) who all work together to provide you with the care you need, when you need it.   You may see any of the following providers on your designated Care Team at your next follow up: Marland Kitchen Dr Glori Bickers . Dr Loralie Champagne . Darrick Grinder, NP   Please be sure to bring in all your medications bottles to every appointment.

## 2019-03-07 NOTE — Progress Notes (Signed)
Heart Failure TeleHealth Note  Due to national recommendations of social distancing due to COVID 19, Audio/video telehealth visit is felt to be most appropriate for this patient at this time.  See MyChart message from today for patient consent regarding [facility].  Date:  03/07/2019   ID:  Christine PattenJudith M Bullard, DOB 07/08/1956, MRN [id number]  Location: Home  Provider location: [facility] Type of Visit: Established patient  PCP:  Annita BrodAsenso, Philip, MD  Cardiologist:  Lesleigh NoeHenry W Smith III, MD Primary HF: [name]  Chief Complaint: Heart Failure follow-up   History of Present Illness:  [name] is a 63 year old with a history ofmorbid obesity,congenital bicuspid aortic valve s/p porcine AVR+ aortic root replacement 2018, HTN, hypothyroidism, OSA, DVT/PEon chronic eliquis, and chronic diastolic heart failure.   Admitted 12/31/2017 with volume overload. Diuresed with IV lasix and transitioned to torsemide 60 mg twice a day. Diuresed 40 pounds. RHC was performed and showed preserved EF and severely dilated left atrium. Discharge weight 244 pounds.   Admitted 11/2 - 06/03/18 with fever. Found to have streptococcal agalactiae  bacteremia and started on IV ABX to finish 06/26/18. Following with ID. Thought to be secondary to left thigh cellulitis. TEE LVEF 60-65% no vegetation.   She presents via Web designeraudio/video conferencing for a telehealth visit today. Graduated from paramedicine, able to fix her own pill box now. Says she is doing very well. Has gotten "really serious" about her diet and fluid intake. Taking torsemide 60 bid. Has lost about 100 pounds. Weight down to 179 pounds. Goes up and down a pound or two but nothing major. Doing some gardening. No problem with ADLs. No dizziness, orthopnea or PND. Checks BP every morning 110-115/69-75 on midodrine. Taking Eliquis with no bleeding.   Taking all medication as directed. Her PCP has changed their  visit model (Now only home visits), and she needs a new PCP.   01/01/2018 ECHO Left ventricle: The cavity size was normal. Systolic function was vigorous. The estimated ejection fraction was in the range of 65% to 70%. Wall motion was normal; there were no regional wall motion abnormalities. There was no evidence of elevated ventricular filling pressure by Doppler parameters. - Right atrium: The atrium was severely dilated. - Tricuspid valve: There was severe regurgitation. - Pulmonary arteries: Systolic pressure was mildly increased. PA peak pressure: 39 mm Hg (S). RHC 12/2017  RA = 12 RV = 32/11 PA = 33/14 (23) PCW = 12 Fick cardiac output/index = 6.37/2.9 PVR = 1.4 WU Ao sat = 98% PA sat = 71%, 73% SVC sat = 67% Assessment: Volume status much improved after marked diuresis. Has evidence of mildly elevated R-sided pressures with normal pulmonary pressures c/w RV dysfunction. Left-sided pressures normal.    Christine PattenJudith M Hicks denies symptoms worrisome for COVID 19.   Past Medical History:  Diagnosis Date  . Acute on chronic diastolic congestive heart failure ([facility])   . Anemia, iron deficiency    negative egd/colonoscopy 11/16/2015  . Aortic stenosis, severe   . Arthritis   . Bicuspid aortic valve   . DVT (deep venous thrombosis) ([facility])    RLE DVT 11/12/15  . GERD (gastroesophageal reflux disease)   . Heart murmur   . Hypertension   . Insomnia   . Morbid obesity with BMI of 45.0-49.9, adult ([facility])   . PONV (postoperative nausea and vomiting)    took a long time to wake up  . S/P partial  sternotomy for aortic root replacement with stentless porcine aortic root graft  01/01/2017   21 mm Medtronic Freestyle porcine aortic root graft with reimplantation of left main and right coronary arteries via partial upper sternotomy  . Thyroid disease   . Wears glasses   . Wears partial dentures    Past Surgical History:  Procedure Laterality Date  . ABDOMINAL SURGERY      Fistula formation, complication after hernia  . AORTIC VALVE REPLACEMENT N/A 01/01/2017   Procedure: MINIMALLY INVASIVE AORTIC VALVE REPLACEMENT (AVR);  Surgeon: Purcell Nailswen, Clarence H, MD;  [facility];  Service: Open Heart Surgery;  Laterality: N/A;  . ASCENDING AORTIC ROOT REPLACEMENT N/A 01/01/2017   Procedure: ASCENDING AORTIC ROOT REPLACEMENT;  Surgeon: Purcell Nailswen, Clarence H, MD;  [facility];  Service: Open Heart Surgery;  Laterality: N/A;  . CARDIAC CATHETERIZATION     12/16/16  . COLONOSCOPY N/A 11/16/2015   Procedure: COLONOSCOPY;  Surgeon: Iva Booparl E Gessner, MD;  [facility];  Service: Endoscopy;  Laterality: N/A;  . ESOPHAGOGASTRODUODENOSCOPY N/A 11/16/2015   Procedure: ESOPHAGOGASTRODUODENOSCOPY (EGD);  Surgeon: Iva Booparl E Gessner, MD;  [facility];  Service: Endoscopy;  Laterality: N/A;  . HERNIA REPAIR    . MULTIPLE TOOTH EXTRACTIONS    . RIGHT HEART CATH N/A 01/11/2018   Procedure: RIGHT HEART CATH;  Surgeon: Dolores PattyBensimhon, Sadrac Zeoli R, MD;  [facility];  Service: Cardiovascular;  Laterality: N/A;  . Right leg cellulitis surgery    . TEE WITHOUT CARDIOVERSION N/A 01/01/2017   Procedure: TRANSESOPHAGEAL ECHOCARDIOGRAM (TEE);  Surgeon: Purcell Nailswen, Clarence H, MD;  [facility];  Service: Open Heart Surgery;  Laterality: N/A;  . TEE WITHOUT CARDIOVERSION N/A 06/02/2018   Procedure: TRANSESOPHAGEAL ECHOCARDIOGRAM (TEE);  Surgeon: Jake BatheSkains, Mark C, MD;  [facility];  Service: Cardiovascular;  Laterality: N/A;     Current Outpatient Medications  Medication Sig Dispense Refill  . acetaminophen (TYLENOL) 500 MG tablet Take 500 mg by mouth every 6 (six) hours as needed for mild pain.    Marland Kitchen. allopurinol (ZYLOPRIM) 100 MG tablet TAKE 1 TABLET BY MOUTH EVERY DAY 30 tablet 8  . aspirin EC 81 MG tablet Take 81 mg by mouth daily.    . cyanocobalamin (,VITAMIN B-12,) 1000 MCG/ML injection Inject 1 mL (1,000 mcg total) into the muscle every 30 (thirty) days. 1 mL 0  .  ELIQUIS 2.5 MG TABS tablet TAKE 1 TABLET BY MOUTH TWICE A DAY 60 tablet 2  . ferrous sulfate 325 (65 FE) MG tablet Take 1 tablet (325 mg total) by mouth 2 (two) times daily with a meal. 60 tablet 3  . fluticasone (FLONASE) 50 MCG/ACT nasal spray PLACE 1 SPRAY INTO BOTH NOSTRILS DAILY. 16 mL 0  . KLOR-CON M20 20 MEQ tablet TAKE 2 TABLETS BY MOUTH EVERY DAY 60 tablet 3  . levothyroxine (SYNTHROID, LEVOTHROID) 175 MCG tablet TAKE 1 TABLET BY MOUTH EVERY DAY 30 tablet 3  . Magnesium 400 MG CAPS Take 400 mg by mouth daily. 30 capsule 3  . Melatonin 3 MG TABS Take 3 mg by mouth at bedtime.    . metolazone (ZAROXOLYN) 2.5 MG tablet Take 1 tablet (2.5 mg total) by mouth daily as needed. (For weight gain more than 255 pounds - fluid/weight gain) 30 tablet 1  . midodrine (PROAMATINE) 2.5 MG tablet Take 1 tablet (2.5 mg total) by mouth 3 (three) times daily with meals. 90 tablet 5  . pantoprazole (PROTONIX) 40 MG tablet TAKE 1 TABLET BY  MOUTH EVERY DAY 30 tablet 11  . spironolactone (ALDACTONE) 25 MG tablet TAKE 1 TABLET BY MOUTH EVERY DAY 30 tablet 1  . torsemide (DEMADEX) 20 MG tablet Take 3 tablets by mouth 2 times daily 180 tablet 0   No current facility-administered medications for this encounter.     Allergies:   No known allergies   Social History:  The patient  reports that she has quit smoking. Her smoking use included cigarettes. She has never used smokeless tobacco. She reports that she does not drink alcohol or use drugs.   Family History:  The patient's family history includes Cardiomyopathy in her son; Emphysema in her father; Heart attack in her mother; Lung cancer in her maternal aunt.   ROS:  Please see the history of present illness.   All other systems are personally reviewed and negative.   Exam:  (Video/Tele Health Call; Exam is subjective and or/visual.) General:  Speaks in full sentences. No resp difficulty. Lungs: Normal respiratory effort with conversation.  Abdomen:  Non-distended per patient report Extremities: Pt denies edema. Neuro: Alert & oriented x 3.   Recent Labs: 05/29/2018: ALT 17 06/03/2018: Hemoglobin 11.9; Platelets 178 06/16/2018: BUN 19; Creatinine, Ser 1.19; Potassium 3.9; Sodium 138  Personally reviewed   Wt Readings from Last 3 Encounters:  06/23/18 96.2 kg (212 lb)  06/16/18 98.4 kg (217 lb)  05/29/18 103.9 kg (229 lb 0.9 oz)     ASSESSMENT AND PLAN:  1. Chronic Diastolic Heart Failure. 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 - RHC numberspreviously reviewed. Minimal PAH - Doing much better - Volume status stable on exam. - Continue torsemide 60 mg BID for now. If continues to lose weight, may be able to decrease dose. Will check labs.  - SBPs typically run 110. Continue midodrine.   2. Suspected OSA - Sleep study completed 12/29/2017. Negative (AHI 1)  3. Morbid Obesity -Much improved with 100 pound weight loss  4. H/O DVT/PE - Continue eliquis 2.5 mg BID per Amplify EXT trial - Denies bleeding.   5. H/Oof Congenital Bicuspid Valve w/ Severe Stenosis: - s/p bioprosthetic aortic valve replacement with aortic root placement, per Dr. Roxy Manns June 2018. - No change to current plan.   - TEE 06/02/18 with stable valve and no evidence of endocarditis.  - Aware of SBE prophylaxis    COVID screen The patient does not have any symptoms that suggest any further testing/ screening at this time.  Social distancing reinforced today.  Recommended follow-up:  As above  Relevant cardiac medications were reviewed at length with the patient today.   The patient does not have concerns regarding their medications at this time.   The following changes were made today:  As above  Today, I have spent 17 minutes with the patient with telehealth technology discussing the above issues .    Signed, Glori Bickers, MD  03/07/2019 1:35 PM  Advanced Heart Failure [place] [address] Heart and [place] [address] [phone] (office) [phone] (fax)

## 2019-03-07 NOTE — Addendum Note (Signed)
Encounter addended by: Scarlette Calico, RN on: 03/07/2019 3:49 PM  Actions taken: Visit diagnoses modified, Order list changed, Diagnosis association updated, Clinical Note Signed

## 2019-03-14 ENCOUNTER — Other Ambulatory Visit (HOSPITAL_COMMUNITY): Payer: Self-pay | Admitting: Internal Medicine

## 2019-03-21 ENCOUNTER — Ambulatory Visit (HOSPITAL_COMMUNITY)
Admission: RE | Admit: 2019-03-21 | Discharge: 2019-03-21 | Disposition: A | Discharge status: Home / Self Care | Payer: BC Managed Care – PPO | Source: Ambulatory Visit | Attending: Cardiology | Admitting: Cardiology

## 2019-03-21 ENCOUNTER — Other Ambulatory Visit (HOSPITAL_COMMUNITY): Payer: Self-pay | Admitting: Internal Medicine

## 2019-03-21 ENCOUNTER — Other Ambulatory Visit: Payer: Self-pay

## 2019-03-21 ENCOUNTER — Telehealth (HOSPITAL_COMMUNITY): Payer: Self-pay

## 2019-03-21 DIAGNOSIS — E038 Other specified hypothyroidism: Secondary | ICD-10-CM | POA: Insufficient documentation

## 2019-03-21 DIAGNOSIS — I5032 Chronic diastolic (congestive) heart failure: Secondary | ICD-10-CM | POA: Diagnosis not present

## 2019-03-21 LAB — CBC
HCT: 46.2 % — ABNORMAL HIGH (ref 36.0–46.0)
Hemoglobin: 15.2 g/dL — ABNORMAL HIGH (ref 12.0–15.0)
MCH: 33 pg (ref 26.0–34.0)
MCHC: 32.9 g/dL (ref 30.0–36.0)
MCV: 100.2 fL — ABNORMAL HIGH (ref 80.0–100.0)
Platelets: 203 10*3/uL (ref 150–400)
RBC: 4.61 MIL/uL (ref 3.87–5.11)
RDW: 13.9 % (ref 11.5–15.5)
WBC: 7 10*3/uL (ref 4.0–10.5)
nRBC: 0 % (ref 0.0–0.2)

## 2019-03-21 LAB — COMPREHENSIVE METABOLIC PANEL
ALT: 16 U/L (ref 0–44)
AST: 26 U/L (ref 15–41)
Albumin: 3.8 g/dL (ref 3.5–5.0)
Alkaline Phosphatase: 121 U/L (ref 38–126)
Anion gap: 11 (ref 5–15)
BUN: 40 mg/dL — ABNORMAL HIGH (ref 8–23)
CO2: 24 mmol/L (ref 22–32)
Calcium: 9.5 mg/dL (ref 8.9–10.3)
Chloride: 98 mmol/L (ref 98–111)
Creatinine, Ser: 1.59 mg/dL — ABNORMAL HIGH (ref 0.44–1.00)
GFR calc Af Amer: 39 mL/min — ABNORMAL LOW (ref >60)
GFR calc non Af Amer: 34 mL/min — ABNORMAL LOW (ref >60)
Glucose, Bld: 118 mg/dL — ABNORMAL HIGH (ref 70–99)
Potassium: 4.3 mmol/L (ref 3.5–5.1)
Sodium: 133 mmol/L — ABNORMAL LOW (ref 135–145)
Total Bilirubin: 1 mg/dL (ref 0.3–1.2)
Total Protein: 7.3 g/dL (ref 6.5–8.1)

## 2019-03-21 LAB — T4, FREE: Free T4: 2.4 ng/dL — ABNORMAL HIGH (ref 0.61–1.12)

## 2019-03-21 LAB — TSH: TSH: 0.015 u[IU]/mL — ABNORMAL LOW (ref 0.350–4.500)

## 2019-03-21 MED ORDER — TORSEMIDE 20 MG PO TABS: ORAL_TABLET | ORAL | 3 refills | Status: DC

## 2019-03-21 NOTE — Telephone Encounter (Signed)
-----   Message from Jolaine Artist, MD sent at 03/21/2019 11:26 AM EDT ----- Renal function worse. Hold torsemide for 2 days. Restart torsemide 60 daily (not bid)  She is hyperthyroid. Needs visit with endocrine (Dr. Renne Crigler) ASAP  thanks

## 2019-03-21 NOTE — Telephone Encounter (Signed)
Discussed results with patient, recommendations for med changes advised. Referral placed for endo. Pt verbalized understanding.

## 2019-03-22 LAB — T3, FREE: T3, Free: 3.7 pg/mL (ref 2.0–4.4)

## 2019-05-18 ENCOUNTER — Other Ambulatory Visit (HOSPITAL_COMMUNITY): Payer: Self-pay | Admitting: Internal Medicine

## 2019-06-30 ENCOUNTER — Other Ambulatory Visit (HOSPITAL_COMMUNITY): Payer: Self-pay | Admitting: Internal Medicine

## 2019-07-01 ENCOUNTER — Other Ambulatory Visit (HOSPITAL_COMMUNITY): Payer: Self-pay | Admitting: Internal Medicine

## 2019-07-10 ENCOUNTER — Other Ambulatory Visit: Payer: Self-pay | Admitting: Interventional Cardiology

## 2019-07-14 ENCOUNTER — Other Ambulatory Visit (HOSPITAL_COMMUNITY): Payer: Self-pay | Admitting: Internal Medicine

## 2019-07-15 ENCOUNTER — Other Ambulatory Visit (HOSPITAL_COMMUNITY): Payer: Self-pay | Admitting: Internal Medicine

## 2019-07-27 ENCOUNTER — Other Ambulatory Visit (HOSPITAL_COMMUNITY): Payer: Self-pay | Admitting: Internal Medicine

## 2019-07-27 ENCOUNTER — Other Ambulatory Visit: Payer: Self-pay | Admitting: Interventional Cardiology

## 2019-08-02 ENCOUNTER — Other Ambulatory Visit (HOSPITAL_COMMUNITY): Payer: Self-pay | Admitting: Internal Medicine

## 2019-08-09 ENCOUNTER — Other Ambulatory Visit (HOSPITAL_COMMUNITY): Payer: Self-pay | Admitting: Internal Medicine

## 2019-09-03 ENCOUNTER — Other Ambulatory Visit (HOSPITAL_COMMUNITY): Payer: Self-pay | Admitting: Internal Medicine

## 2019-09-19 ENCOUNTER — Other Ambulatory Visit (HOSPITAL_COMMUNITY): Payer: Self-pay | Admitting: Internal Medicine

## 2019-10-11 ENCOUNTER — Other Ambulatory Visit: Payer: Self-pay | Admitting: Interventional Cardiology

## 2019-10-14 ENCOUNTER — Other Ambulatory Visit (HOSPITAL_COMMUNITY): Payer: Self-pay | Admitting: Internal Medicine

## 2019-10-28 ENCOUNTER — Other Ambulatory Visit: Payer: Self-pay | Admitting: Interventional Cardiology

## 2019-10-28 ENCOUNTER — Other Ambulatory Visit (HOSPITAL_COMMUNITY): Payer: Self-pay | Admitting: Internal Medicine

## 2019-11-17 ENCOUNTER — Other Ambulatory Visit: Payer: Self-pay | Admitting: Interventional Cardiology

## 2019-12-03 ENCOUNTER — Other Ambulatory Visit: Payer: Self-pay | Admitting: Interventional Cardiology

## 2019-12-14 ENCOUNTER — Other Ambulatory Visit: Payer: Self-pay | Admitting: Interventional Cardiology

## 2019-12-18 ENCOUNTER — Other Ambulatory Visit (HOSPITAL_COMMUNITY): Payer: Self-pay | Admitting: Internal Medicine

## 2019-12-20 ENCOUNTER — Other Ambulatory Visit (HOSPITAL_COMMUNITY): Payer: Self-pay | Admitting: Internal Medicine

## 2020-01-02 ENCOUNTER — Telehealth (HOSPITAL_COMMUNITY): Payer: Self-pay | Admitting: Pharmacy Technician

## 2020-01-02 NOTE — Telephone Encounter (Signed)
Received message that patient needs help affording Eliquis. Co-pay currently is $47. Can try to sign her up for BMS assistance. Called and left patient message.  Will follow up.

## 2020-01-02 NOTE — Telephone Encounter (Signed)
Patient had Eliquis co-pay card but was having trouble activating it. Was able to activate card, patient aware. Will not seek assistance at this time.  Pharmacy Billing Information  BIN: [id number]  PCN: [id number]  Group: [id number]  ID: [id number]  Archer Asa, CPhT

## 2020-01-16 ENCOUNTER — Other Ambulatory Visit: Payer: Self-pay | Admitting: Interventional Cardiology

## 2020-01-18 NOTE — Telephone Encounter (Signed)
Ok to give 2 more weeks supply.  After that will need to get from PCP because this is not a cardiac med.  This was started in the hospital in 2019 for a gout flare up in the ankle.  They will need to take over prescribing this.  Also, pt hasn't been seen in the office in about 2 years.

## 2020-01-18 NOTE — Telephone Encounter (Signed)
Pt's pharmacy is requesting a refill on allopurinol. Would Dr. Katrinka Blazing like to refill this medication? Please address

## 2020-01-20 ENCOUNTER — Other Ambulatory Visit: Payer: Self-pay | Admitting: Interventional Cardiology

## 2020-01-27 ENCOUNTER — Other Ambulatory Visit: Payer: Self-pay | Admitting: Interventional Cardiology

## 2020-02-17 ENCOUNTER — Other Ambulatory Visit: Payer: Self-pay | Admitting: Interventional Cardiology

## 2020-02-17 ENCOUNTER — Other Ambulatory Visit (HOSPITAL_COMMUNITY): Payer: Self-pay | Admitting: Internal Medicine

## 2020-02-18 ENCOUNTER — Other Ambulatory Visit: Payer: Self-pay | Admitting: Interventional Cardiology

## 2020-03-06 ENCOUNTER — Other Ambulatory Visit: Payer: Self-pay | Admitting: Interventional Cardiology

## 2020-03-23 ENCOUNTER — Other Ambulatory Visit (HOSPITAL_COMMUNITY): Payer: Self-pay | Admitting: Internal Medicine

## 2020-04-01 ENCOUNTER — Other Ambulatory Visit: Payer: Self-pay | Admitting: Interventional Cardiology

## 2020-04-12 ENCOUNTER — Other Ambulatory Visit: Payer: Self-pay | Admitting: Interventional Cardiology

## 2020-04-13 ENCOUNTER — Other Ambulatory Visit (HOSPITAL_COMMUNITY): Payer: Self-pay | Admitting: Internal Medicine

## 2020-04-19 ENCOUNTER — Other Ambulatory Visit (HOSPITAL_COMMUNITY): Payer: Self-pay | Admitting: Internal Medicine

## 2020-05-21 ENCOUNTER — Other Ambulatory Visit (HOSPITAL_COMMUNITY): Payer: Self-pay | Admitting: Cardiology

## 2020-06-10 ENCOUNTER — Other Ambulatory Visit: Payer: Self-pay | Admitting: Interventional Cardiology

## 2020-06-10 ENCOUNTER — Other Ambulatory Visit (HOSPITAL_COMMUNITY): Payer: Self-pay | Admitting: Internal Medicine

## 2020-06-10 ENCOUNTER — Other Ambulatory Visit (HOSPITAL_COMMUNITY): Payer: Self-pay | Admitting: Cardiology

## 2020-06-17 ENCOUNTER — Other Ambulatory Visit (HOSPITAL_COMMUNITY): Payer: Self-pay | Admitting: Cardiology

## 2020-06-17 ENCOUNTER — Other Ambulatory Visit (HOSPITAL_COMMUNITY): Payer: Self-pay | Admitting: Internal Medicine

## 2020-06-19 ENCOUNTER — Other Ambulatory Visit (HOSPITAL_COMMUNITY): Payer: Self-pay

## 2020-06-19 MED ORDER — TORSEMIDE 20 MG PO TABS
60.0000 mg | ORAL_TABLET | Freq: Every day | ORAL | 0 refills | Status: DC
Start: 2020-06-19 — End: 2020-07-11

## 2020-06-20 ENCOUNTER — Other Ambulatory Visit (HOSPITAL_COMMUNITY): Payer: Self-pay

## 2020-06-20 MED ORDER — MIDODRINE HCL 2.5 MG PO TABS
2.5000 mg | ORAL_TABLET | Freq: Three times a day (TID) | ORAL | 0 refills | Status: DC
Start: 2020-06-20 — End: 2020-06-28

## 2020-06-21 ENCOUNTER — Other Ambulatory Visit: Payer: Self-pay | Admitting: Interventional Cardiology

## 2020-06-27 ENCOUNTER — Other Ambulatory Visit (HOSPITAL_COMMUNITY): Payer: Self-pay

## 2020-06-28 MED ORDER — MIDODRINE HCL 2.5 MG PO TABS
2.5000 mg | ORAL_TABLET | Freq: Three times a day (TID) | ORAL | 2 refills | Status: DC
Start: 2020-06-28 — End: 2020-08-22

## 2020-06-29 ENCOUNTER — Other Ambulatory Visit: Payer: Self-pay | Admitting: Interventional Cardiology

## 2020-07-11 ENCOUNTER — Other Ambulatory Visit (HOSPITAL_COMMUNITY): Payer: Self-pay | Admitting: Internal Medicine

## 2020-07-19 ENCOUNTER — Other Ambulatory Visit: Payer: Self-pay | Admitting: Interventional Cardiology

## 2020-07-20 ENCOUNTER — Other Ambulatory Visit: Payer: Self-pay | Admitting: Interventional Cardiology

## 2020-07-22 ENCOUNTER — Other Ambulatory Visit: Payer: Self-pay | Admitting: Interventional Cardiology

## 2020-07-23 ENCOUNTER — Other Ambulatory Visit (HOSPITAL_COMMUNITY): Payer: Self-pay | Admitting: Internal Medicine

## 2020-07-25 ENCOUNTER — Other Ambulatory Visit (HOSPITAL_COMMUNITY): Payer: Self-pay | Admitting: Internal Medicine

## 2020-08-01 ENCOUNTER — Other Ambulatory Visit: Payer: Self-pay | Admitting: Interventional Cardiology

## 2020-08-16 ENCOUNTER — Other Ambulatory Visit: Payer: Self-pay | Admitting: Interventional Cardiology

## 2020-08-22 ENCOUNTER — Ambulatory Visit (HOSPITAL_COMMUNITY)
Admission: RE | Admit: 2020-08-22 | Discharge: 2020-08-22 | Disposition: A | Discharge status: Home / Self Care | Payer: Medicare PPO | Source: Ambulatory Visit | Attending: Internal Medicine | Admitting: Internal Medicine

## 2020-08-22 ENCOUNTER — Encounter (HOSPITAL_COMMUNITY): Payer: Self-pay | Admitting: Internal Medicine

## 2020-08-22 ENCOUNTER — Other Ambulatory Visit: Payer: Self-pay

## 2020-08-22 VITALS — BP 150/78 | HR 66 | Wt 154.8 lb

## 2020-08-22 DIAGNOSIS — Z954 Presence of other heart-valve replacement: Secondary | ICD-10-CM

## 2020-08-22 DIAGNOSIS — I5032 Chronic diastolic (congestive) heart failure: Secondary | ICD-10-CM | POA: Diagnosis present

## 2020-08-22 DIAGNOSIS — Z952 Presence of prosthetic heart valve: Secondary | ICD-10-CM

## 2020-08-22 LAB — CBC
HCT: 47 % — ABNORMAL HIGH (ref 36.0–46.0)
Hemoglobin: 14.9 g/dL (ref 12.0–15.0)
MCH: 31.6 pg (ref 26.0–34.0)
MCHC: 31.7 g/dL (ref 30.0–36.0)
MCV: 99.8 fL (ref 80.0–100.0)
Platelets: 172 10*3/uL (ref 150–400)
RBC: 4.71 MIL/uL (ref 3.87–5.11)
RDW: 14.1 % (ref 11.5–15.5)
WBC: 6.6 10*3/uL (ref 4.0–10.5)
nRBC: 0 % (ref 0.0–0.2)

## 2020-08-22 LAB — BASIC METABOLIC PANEL
Anion gap: 10 (ref 5–15)
BUN: 39 mg/dL — ABNORMAL HIGH (ref 8–23)
CO2: 29 mmol/L (ref 22–32)
Calcium: 9.8 mg/dL (ref 8.9–10.3)
Chloride: 98 mmol/L (ref 98–111)
Creatinine, Ser: 1.15 mg/dL — ABNORMAL HIGH (ref 0.44–1.00)
GFR, Estimated: 53 mL/min — ABNORMAL LOW (ref >60)
Glucose, Bld: 95 mg/dL (ref 70–99)
Potassium: 4.4 mmol/L (ref 3.5–5.1)
Sodium: 137 mmol/L (ref 135–145)

## 2020-08-22 LAB — BRAIN NATRIURETIC PEPTIDE: B Natriuretic Peptide: 138.3 pg/mL — ABNORMAL HIGH (ref 0.0–100.0)

## 2020-08-22 MED ORDER — TORSEMIDE 20 MG PO TABS: ORAL_TABLET | ORAL | 6 refills | Status: DC

## 2020-08-22 MED ORDER — AMOXICILLIN 500 MG PO TABS: 2000.0000 mg | ORAL_TABLET | ORAL | 3 refills | Status: DC | PRN

## 2020-08-22 NOTE — Progress Notes (Signed)
Medication Samples have been provided to the patient.  Drug name: Eliquis       Strength: 2.5        Qty: 4  LOT: [id number]  Exp.Date: 1/23  Dosing instructions: Take 1 tablet Twice daily   The patient has been instructed regarding the correct time, dose, and frequency of taking this medication, including desired effects and most common side effects.   Smitty Cords Donnetta Gillin 11:51 AM 08/22/2020

## 2020-08-22 NOTE — Progress Notes (Signed)
[facility] Note   Date:  08/22/2020   ID:  Christine Hicks, DOB 1956-06-26, MRN [id number]  Location: Home  Provider location: [facility] Type of Visit: Established patient  PCP:  Christine Brod, MD  Cardiologist:  Christine Noe, MD Primary HF: Christine Hicks  Chief Complaint: Heart Failure follow-up   History of Present Illness:  Christine Hicks is a 65 year old with a history ofmorbid obesity,congenital bicuspid aortic valve s/p porcine AVR+ aortic root replacement 2018, HTN, hypothyroidism, OSA, DVT/PEon chronic eliquis, and chronic diastolic heart failure.   Admitted 12/31/2017 with volume overload. Diuresed with IV lasix and transitioned to torsemide 60 mg twice a day. Diuresed 40 pounds. RHC was performed and showed preserved EF and severely dilated left atrium. Discharge weight 244 pounds.   Admitted 11/19 with fever. Found to have streptococcal agalactiae  bacteremia and started on IV ABX to finish 06/26/18. Following with ID. Thought to be secondary to left thigh cellulitis. TEE LVEF 60-65% no vegetation.   Here with her husband. Feels good. Has changed her eating tablets tremendously. Was on torsemide 60 bid but now only taking 60 daily but says she feels she needs 20 mg at night. She is taking this way but now she is running out. No edema or CP. Able to wear shoes for first time in 2 years.  SBP 110-120 at home   01/01/2018 ECHO Left ventricle: The cavity size was normal. Systolic function was vigorous. The estimated ejection fraction was in the range of 65% to 70%. Wall motion was normal; there were no regional wall motion abnormalities. There was no evidence of elevated ventricular filling pressure by Doppler parameters. - Right atrium: The atrium was severely dilated. - Tricuspid valve: There was severe regurgitation. - Pulmonary arteries: Systolic pressure was mildly increased. PA peak pressure: 39 mm Hg  (S). RHC 12/2017  RA = 12 RV = 32/11 PA = 33/14 (23) PCW = 12 Fick cardiac output/index = 6.37/2.9 PVR = 1.4 WU Ao sat = 98% PA sat = 71%, 73% SVC sat = 67% Assessment: Volume status much improved after marked diuresis. Has evidence of mildly elevated R-sided pressures with normal pulmonary pressures c/w RV dysfunction. Left-sided pressures normal.    Past Medical History:  Diagnosis Date  . Acute on chronic diastolic congestive heart failure (HCC)   . Anemia, iron deficiency    negative egd/colonoscopy 11/16/2015  . Aortic stenosis, severe   . Arthritis   . Bicuspid aortic valve   . DVT (deep venous thrombosis) (HCC)    RLE DVT 11/12/15  . GERD (gastroesophageal reflux disease)   . Heart murmur   . Hypertension   . Insomnia   . Morbid obesity with BMI of 45.0-49.9, adult (HCC)   . PONV (postoperative nausea and vomiting)    took a long time to wake up  . 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 Thyroid disease   . Wears glasses   . Wears partial dentures    Past Surgical History:  Procedure Laterality Date  . 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 AORTIC VALVE REPLACEMENT N/A 01/01/2017   Procedure: MINIMALLY INVASIVE AORTIC VALVE REPLACEMENT (AVR);  Surgeon: Purcell Nails, MD;  Location: [facility]  OR;  Service: Open Heart Surgery;  Laterality: N/A;  . ASCENDING AORTIC ROOT REPLACEMENT N/A 01/01/2017   Procedure: ASCENDING AORTIC ROOT REPLACEMENT;  Surgeon: Purcell Nails, MD;  Location: [facility] OR;  Service: Open Heart Surgery;  Laterality: N/A;  . CARDIAC CATHETERIZATION     12/16/16  . COLONOSCOPY N/A 11/16/2015   Procedure: COLONOSCOPY;  Surgeon: Iva Boop, MD;  Location: [facility] ENDOSCOPY;  Service: Endoscopy;  Laterality: N/A;  .  ESOPHAGOGASTRODUODENOSCOPY N/A 11/16/2015   Procedure: ESOPHAGOGASTRODUODENOSCOPY (EGD);  Surgeon: Iva Boop, MD;  Location: [facility] ENDOSCOPY;  Service: Endoscopy;  Laterality: N/A;  . HERNIA REPAIR    . MULTIPLE TOOTH EXTRACTIONS    . RIGHT HEART CATH N/A 01/11/2018   Procedure: RIGHT HEART CATH;  Surgeon: Dolores Patty, MD;  Location: [facility] INVASIVE CV LAB;  Service: Cardiovascular;  Laterality: N/A;  . Right leg cellulitis surgery    . TEE WITHOUT CARDIOVERSION N/A 01/01/2017   Procedure: TRANSESOPHAGEAL ECHOCARDIOGRAM (TEE);  Surgeon: Purcell Nails, MD;  Location: [facility] OR;  Service: Open Heart Surgery;  Laterality: N/A;  . TEE WITHOUT CARDIOVERSION N/A 06/02/2018   Procedure: TRANSESOPHAGEAL ECHOCARDIOGRAM (TEE);  Surgeon: Jake Bathe, MD;  Location: [facility] ENDOSCOPY;  Service: Cardiovascular;  Laterality: N/A;     Current Outpatient Medications  Medication Sig Dispense Refill  . acetaminophen (TYLENOL) 500 MG tablet Take 500 mg by mouth every 6 (six) hours as needed for mild pain.    Marland Kitchen allopurinol (ZYLOPRIM) 100 MG tablet TAKE 1 TABLET (100 MG TOTAL) BY MOUTH DAILY. PT MUST REFILL WITH PCP. THANKS 30 tablet 0  . aspirin EC 81 MG tablet Take 81 mg by mouth daily.    Marland Kitchen ELIQUIS 2.5 MG TABS tablet TAKE 1 TABLET (2.5 MG TOTAL) BY MOUTH 2 (TWO) TIMES DAILY. LAST REFILL WITHOUT OFFICE VISIT 60 tablet 0  . ferrous sulfate 325 (65 FE) MG tablet Take 1 tablet (325 mg total) by mouth 2 (two) times daily with a meal. 60 tablet 3  . KLOR-CON M20 20 MEQ tablet TAKE 2 TABLETS BY MOUTH EVERY DAY 180 tablet 1  . levothyroxine (SYNTHROID) 175 MCG tablet TAKE 1 TABLET BY MOUTH EVERY DAY 90 tablet 1  . Magnesium 400 MG CAPS Take 400 mg by mouth daily. 30 capsule 3  . Melatonin 3 MG TABS Take 3 mg by mouth at bedtime.    . metolazone (ZAROXOLYN) 2.5 MG tablet Take 1 tablet (2.5 mg total) by mouth daily as needed. (For weight gain more than 255 pounds - fluid/weight gain) 30 tablet 1  . midodrine  (PROAMATINE) 2.5 MG tablet Take 1 tablet (2.5 mg total) by mouth 3 (three) times daily with meals. Last refill without office visit 90 tablet 2  . pantoprazole (PROTONIX) 40 MG tablet TAKE 1 TABLET BY MOUTH DAILY. MUST MAKE AN APPOINTMENT WITH DR. Katrinka Blazing FOR FURTHER REFILLS. 15 tablet 0  . spironolactone (ALDACTONE) 25 MG tablet TAKE 1 TABLET BY MOUTH EVERY DAY 90 tablet 3  . torsemide (DEMADEX) 20 MG tablet TAKE 3 TABLETS (60 MG TOTAL) BY MOUTH DAILY. LAST REFILL WITHOUT OFFICE VISIT 90 tablet 1   No current facility-administered medications for this encounter.    Allergies:   No known allergies   Social History:  The patient  reports that she has quit smoking. Her smoking use included cigarettes. She has never used smokeless tobacco. She reports that she does not drink alcohol and does not use drugs.   Family History:  The patient's family  history includes Cardiomyopathy in her son; Emphysema in her father; Heart attack in her mother; Lung cancer in her maternal aunt.   ROS:  Please see the history of present illness.   All other systems are personally reviewed and negative.   Vitals:   08/22/20 1053  BP: (!) 150/78  Pulse: 66  SpO2: 98%  Weight: 70.2 kg (154 lb 12.8 oz)    Exam:   General:  Well appearing. No resp difficulty HEENT: normal Neck: supple. JVP 7-8  Carotids 2+ bilat; no bruits. No lymphadenopathy or thryomegaly appreciated. Cor: Obese PMI nondisplaced. Regular rate & rhythm. No rubs, gallops or murmurs. Lungs: clear Abdomen: obese soft, nontender, nondistended. No hepatosplenomegaly. No bruits or masses. Good bowel sounds. Extremities: no cyanosis, clubbing, rash, edema Neuro: alert & orientedx3, cranial nerves grossly intact. moves all 4 extremities w/o difficulty. Affect pleasant   Recent Labs: No results found for requested labs within last 8760 hours.  Personally reviewed   Wt Readings from Last 3 Encounters:  08/22/20 70.2 kg (154 lb 12.8 oz)  06/23/18  96.2 kg (212 lb)  06/16/18 98.4 kg (217 lb)     ASSESSMENT AND PLAN:  1. Chronic Diastolic Heart Failure. 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 - RHC numbers. Minimal PAH - Doing much better NYHA II - Volume status very mildly elevated. Increase torsemide form 60 daily to 60/20. Reinforced need for daily weights and reviewed use of sliding scale diuretics. - SBPs much improved. Can stop midodrine  - Repeat echo. If LVH present on echo can consider PYP scan for TTR amyloid  - Check labs today  2. Suspected OSA - Sleep study completed 12/29/2017. Negative (AHI 1)  3. Morbid Obesity -Much improved with 100 pound weight loss. Congratulated her on her efforts - Body mass index is 25.76 kg/m.  4. H/O DVT/PE - Continue eliquis 2.5 mg BID per Amplify EXT trial - Denies bleeding.   5. H/Oof Congenital Bicuspid Valve w/ Severe Stenosis: - s/p bioprosthetic aortic valve replacement with aortic root placement, per Dr. Cornelius Moras June 2018. - No change to current plan.   - TEE 06/02/18 with stable valve and no evidence of endocarditis.  - Aware of SBE prophylaxis    Signed, Arvilla Meres, MD  08/22/2020 11:24 AM  [facility] [address] [facility] [place] [address] [phone] (office) [phone] (fax)

## 2020-08-22 NOTE — Patient Instructions (Signed)
Stop Midodrine, you can restart if your SBP is running less than 100  Increase Torsemide to 60 mg (3 tabs) in AM and 20 mg (1 tab) in PM  Labs done today, your results will be available in MyChart, we will contact you for abnormal readings.  Your physician has requested that you have an echocardiogram. Echocardiography is a painless test that uses sound waves to create images of your heart. It provides your doctor with information about the size and shape of your heart and how well your heart's chambers and valves are working. This procedure takes approximately one hour. There are no restrictions for this procedure.  Please call our office in June 2022 to schedule your follow up appointment  If you have any questions or concerns before your next appointment please send [place] a message through [place] or call our office at [phone].    TO LEAVE A MESSAGE FOR THE NURSE SELECT OPTION 2, PLEASE LEAVE A MESSAGE INCLUDING: . YOUR NAME . DATE OF BIRTH . CALL BACK NUMBER . REASON FOR CALL**this is important as we prioritize the call backs  YOU WILL RECEIVE A CALL BACK THE SAME DAY AS LONG AS YOU CALL BEFORE 4:00 PM  At the [facility], you and your health needs are our priority. As part of our continuing mission to provide you with exceptional heart care, we have created designated Provider Care Teams. These Care Teams include your primary Cardiologist (physician) and Advanced Practice Providers (APPs- Physician Assistants and Nurse Practitioners) who all work together to provide you with the care you need, when you need it.   You may see any of the following providers on your designated Care Team at your next follow up: Marland Kitchen Dr Arvilla Meres . Dr Marca Ancona . Tonye Becket, NP . Robbie Lis, PA . Shanda Bumps [place],NP . Karle Plumber, PharmD   Please be sure to bring in all your medications bottles to every appointment.

## 2020-09-04 ENCOUNTER — Ambulatory Visit (HOSPITAL_COMMUNITY)
Admission: RE | Admit: 2020-09-04 | Discharge: 2020-09-04 | Disposition: A | Discharge status: Home / Self Care | Payer: Medicare PPO | Source: Ambulatory Visit | Attending: Internal Medicine | Admitting: Internal Medicine

## 2020-09-04 ENCOUNTER — Other Ambulatory Visit: Payer: Self-pay

## 2020-09-04 DIAGNOSIS — Z953 Presence of xenogenic heart valve: Secondary | ICD-10-CM | POA: Insufficient documentation

## 2020-09-04 DIAGNOSIS — I5032 Chronic diastolic (congestive) heart failure: Secondary | ICD-10-CM | POA: Insufficient documentation

## 2020-09-04 DIAGNOSIS — I110 Hypertensive heart disease with heart failure: Secondary | ICD-10-CM | POA: Diagnosis present

## 2020-09-04 DIAGNOSIS — Z954 Presence of other heart-valve replacement: Secondary | ICD-10-CM

## 2020-09-04 DIAGNOSIS — I071 Rheumatic tricuspid insufficiency: Secondary | ICD-10-CM | POA: Insufficient documentation

## 2020-09-04 LAB — ECHOCARDIOGRAM COMPLETE
AR max vel: 0.67 cm2
AV Area VTI: 0.77 cm2
AV Area mean vel: 0.7 cm2
AV Mean grad: 6 mmHg
AV Peak grad: 14.3 mmHg
Ao pk vel: 1.89 m/s
Area-P 1/2: 4.15 cm2
S' Lateral: 2.4 cm

## 2020-09-04 NOTE — Progress Notes (Signed)
  Echocardiogram 2D Echocardiogram has been performed.  Christine Hicks 09/04/2020, 11:02 AM

## 2020-09-08 ENCOUNTER — Other Ambulatory Visit: Payer: Self-pay | Admitting: Interventional Cardiology

## 2020-09-19 ENCOUNTER — Other Ambulatory Visit (HOSPITAL_COMMUNITY): Payer: Self-pay | Admitting: Internal Medicine

## 2020-10-03 ENCOUNTER — Other Ambulatory Visit (HOSPITAL_COMMUNITY): Payer: Self-pay | Admitting: Internal Medicine

## 2020-10-11 ENCOUNTER — Other Ambulatory Visit: Payer: Self-pay

## 2020-10-11 MED ORDER — PANTOPRAZOLE SODIUM 40 MG PO TBEC: DELAYED_RELEASE_TABLET | ORAL | 0 refills | Status: DC

## 2020-10-11 NOTE — Telephone Encounter (Signed)
[facility] mail order pharmacy is requesting a refill on pantoprazole. Would Dr. Katrinka Blazing like to refill this medication? Pt has an upcoming appt in June. Please address

## 2020-10-11 NOTE — Telephone Encounter (Signed)
Ok to order a 90 day supply to last her until her appt.  Needs to keep appt for further refills.

## 2020-10-12 ENCOUNTER — Other Ambulatory Visit (HOSPITAL_COMMUNITY): Payer: Self-pay

## 2020-10-12 ENCOUNTER — Other Ambulatory Visit (HOSPITAL_COMMUNITY): Payer: Self-pay | Admitting: Internal Medicine

## 2020-10-12 MED ORDER — POTASSIUM CHLORIDE CRYS ER 20 MEQ PO TBCR: 40.0000 meq | EXTENDED_RELEASE_TABLET | Freq: Every day | ORAL | 1 refills | Status: DC

## 2020-10-12 MED ORDER — TORSEMIDE 20 MG PO TABS: ORAL_TABLET | ORAL | 0 refills | Status: DC

## 2020-10-12 MED ORDER — SPIRONOLACTONE 25 MG PO TABS: 25.0000 mg | ORAL_TABLET | Freq: Every day | ORAL | 0 refills | Status: DC

## 2020-10-12 MED ORDER — APIXABAN 2.5 MG PO TABS: ORAL_TABLET | ORAL | 4 refills | Status: DC

## 2020-10-12 MED ORDER — LEVOTHYROXINE SODIUM 175 MCG PO TABS: 175.0000 ug | ORAL_TABLET | Freq: Every day | ORAL | 0 refills | Status: DC

## 2020-10-15 ENCOUNTER — Other Ambulatory Visit: Payer: Self-pay | Admitting: *Deleted

## 2020-10-15 MED ORDER — ALLOPURINOL 100 MG PO TABS: 100.0000 mg | ORAL_TABLET | Freq: Every day | ORAL | 1 refills | Status: DC

## 2020-10-19 ENCOUNTER — Other Ambulatory Visit: Payer: Self-pay

## 2020-10-19 MED ORDER — PANTOPRAZOLE SODIUM 40 MG PO TBEC: DELAYED_RELEASE_TABLET | ORAL | 0 refills | Status: DC

## 2020-11-10 ENCOUNTER — Other Ambulatory Visit (HOSPITAL_COMMUNITY): Payer: Self-pay | Admitting: Internal Medicine

## 2020-11-21 ENCOUNTER — Other Ambulatory Visit (HOSPITAL_COMMUNITY): Payer: Self-pay | Admitting: Internal Medicine

## 2021-01-11 ENCOUNTER — Other Ambulatory Visit: Payer: Self-pay | Admitting: Interventional Cardiology

## 2021-01-11 ENCOUNTER — Other Ambulatory Visit (HOSPITAL_COMMUNITY): Payer: Self-pay | Admitting: Internal Medicine

## 2021-01-21 NOTE — Progress Notes (Signed)
Cardiology Office Note:    Date:  01/23/2021   ID:  Christine Hicks, DOB 12/20/1955, MRN [id number]  PCP:  Oneita HurtPcp, No  Cardiologist:  Lesleigh NoeHenry W Kayce Betty III, MD   Referring MD: No ref. provider found   Chief Complaint  Patient presents with   Cardiac Valve Problem    Aortic valve replacement   Congestive Heart Failure     History of Present Illness:    Christine Hicks is a 65 y.o. 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  Significant weight loss>50 lbs.   Having significant left nostril nosebleeds.  Has lost a large amount of weight to the extent that she looks totally different.  I congratulated her on changing her diet and losing significant weight which will improve her long-term outcome.  I reviewed echo reports.  Pulmonary pressures are now near normal.  She is all the Amplify EXT trial which requires Eliquis 2.5 mg twice daily.  Past Medical History:  Diagnosis Date   Acute on chronic diastolic congestive heart failure (HCC)    Anemia, iron deficiency    negative egd/colonoscopy 11/16/2015   Aortic stenosis, severe    Arthritis    Bicuspid aortic valve    DVT (deep venous thrombosis) (HCC)    RLE DVT 11/12/15   GERD (gastroesophageal reflux disease)    Heart murmur    Hypertension    Insomnia    Morbid obesity with BMI of 45.0-49.9, adult (HCC)    PONV (postoperative nausea and vomiting)    took a long time to wake up   S/P partial sternotomy for aortic root replacement with stentless porcine aortic root graft  01/01/2017   21 mm Medtronic Freestyle porcine aortic root graft with reimplantation of left main and right coronary arteries via partial upper sternotomy   Thyroid disease    Wears glasses    Wears partial dentures     Past Surgical History:  Procedure Laterality  Date   ABDOMINAL SURGERY     Fistula formation, complication after hernia   AORTIC VALVE REPLACEMENT N/A 01/01/2017   Procedure: MINIMALLY INVASIVE AORTIC VALVE REPLACEMENT (AVR);  Surgeon: Purcell Nailswen, Clarence H, MD;  [facility];  Service: Open Heart Surgery;  Laterality: N/A;   ASCENDING AORTIC ROOT REPLACEMENT N/A 01/01/2017   Procedure: ASCENDING AORTIC ROOT REPLACEMENT;  Surgeon: Purcell Nailswen, Clarence H, MD;  [facility];  Service: Open Heart Surgery;  Laterality: N/A;   CARDIAC CATHETERIZATION     12/16/16   COLONOSCOPY N/A 11/16/2015   Procedure: COLONOSCOPY;  Surgeon: Iva Booparl E Gessner, MD;  [facility];  Service: Endoscopy;  Laterality: N/A;   ESOPHAGOGASTRODUODENOSCOPY N/A 11/16/2015   Procedure: ESOPHAGOGASTRODUODENOSCOPY (EGD);  Surgeon: Iva Booparl E Gessner, MD;  [facility];  Service: Endoscopy;  Laterality: N/A;   HERNIA REPAIR     MULTIPLE TOOTH EXTRACTIONS     RIGHT HEART CATH N/A 01/11/2018   Procedure: RIGHT HEART CATH;  Surgeon: Dolores PattyBensimhon, Daniel R, MD;  Location: [facility] INVASIVE CV LAB;  Service: Cardiovascular;  Laterality: N/A;   Right leg cellulitis surgery     TEE WITHOUT CARDIOVERSION N/A 01/01/2017   Procedure: TRANSESOPHAGEAL ECHOCARDIOGRAM (TEE);  Surgeon: Purcell Nailswen, Clarence H, MD;  [facility];  Service: Open Heart Surgery;  Laterality: N/A;   TEE WITHOUT CARDIOVERSION N/A 06/02/2018   Procedure: TRANSESOPHAGEAL ECHOCARDIOGRAM (TEE);  Surgeon: Jake Bathe, MD;  Location: [facility] ENDOSCOPY;  Service: Cardiovascular;  Laterality: N/A;    Current Medications: Current Meds  Medication Sig   acetaminophen (TYLENOL) 500 MG tablet Take 500 mg by mouth every 6 (six) hours as needed for mild pain.   allopurinol (ZYLOPRIM) 100 MG tablet Take 1 tablet (100 mg total) by mouth daily. Pt must refill with PCP. Thanks   amoxicillin (AMOXIL) 500 MG tablet Take 4 tablets (2,000 mg total) by mouth as needed (1 hour before dental work).   apixaban (ELIQUIS) 2.5 MG TABS tablet TAKE 1  TABLET (2.5 MG TOTAL) BY MOUTH 2 (TWO) TIMES DAILY.   aspirin EC 81 MG tablet Take 81 mg by mouth daily.   ferrous sulfate 325 (65 FE) MG tablet Take 1 tablet (325 mg total) by mouth 2 (two) times daily with a meal.   levothyroxine (SYNTHROID) 175 MCG tablet Take 1 tablet (175 mcg total) by mouth daily.   Magnesium 400 MG CAPS Take 400 mg by mouth daily.   Melatonin 3 MG TABS Take 3 mg by mouth at bedtime.   metolazone (ZAROXOLYN) 2.5 MG tablet Take 1 tablet (2.5 mg total) by mouth daily as needed. (For weight gain more than 255 pounds - fluid/weight gain)   pantoprazole (PROTONIX) 40 MG tablet TAKE 1 TABLET BY MOUTH DAILY. PLEASE KEEP UPCOMING APPT IN JUNE 2022 WITH DR. Katrinka Blazing BEFORE REFILLS   potassium chloride SA (KLOR-CON M20) 20 MEQ tablet Take 2 tablets (40 mEq total) by mouth daily.   spironolactone (ALDACTONE) 25 MG tablet TAKE 1 TABLET BY MOUTH EVERY DAY   torsemide (DEMADEX) 20 MG tablet Take 3 tablets (60 mg total) by mouth in the morning AND 1 tablet (20 mg total) every evening.     Allergies:   No known allergies   Social History   Socioeconomic History   Marital status: Significant Other    Spouse name: Not on file   Number of children: Not on file   Years of education: Not on file   Highest education level: Not on file  Occupational History   Occupation: retired  Tobacco Use   Smoking status: Former    Pack years: 0.00    Types: Cigarettes   Smokeless tobacco: Never   Tobacco comments:    quit smoking cigarettes > 25 years ago  Vaping Use   Vaping Use: Never used  Substance and Sexual Activity   Alcohol use: No   Drug use: No   Sexual activity: Yes  Other Topics Concern   Not on file  Social History Narrative   Not on file   Social Determinants of Health   Financial Resource Strain: Not on file  Food Insecurity: Not on file  Transportation Needs: Not on file  Physical Activity: Not on file  Stress: Not on file  Social Connections: Not on file      Family History: The patient's family history includes Cardiomyopathy in her son; Emphysema in her father; Heart attack in her mother; Lung cancer in her maternal aunt.  ROS:   Please see the history of present illness.    Has difficulty with balance.  Walks with a cane.  She is doing some gardening.  She does not achieve 150 minutes/week.  All other systems reviewed and are negative.  EKGs/Labs/Other Studies Reviewed:    The following studies were reviewed today:  ECHOCARDIOGRAM 09/04/2020: IMPRESSIONS     1. Left ventricular ejection fraction, by estimation, is 60 to 65%. The  left ventricle has normal function. The left ventricle has no regional  wall motion abnormalities. Left ventricular diastolic parameters were  normal.   2. Right ventricular systolic function is normal. The right ventricular  size is normal. There is mildly elevated pulmonary artery systolic  pressure.   3. Left atrial size was moderately dilated.   4. Right atrial size was mildly dilated.   5. The mitral valve is normal in structure. Trivial mitral valve  regurgitation. No evidence of mitral stenosis.   6. Tricuspid valve regurgitation is moderate.   7. The aortic valve has been repaired/replaced. Aortic valve  regurgitation is not visualized. No aortic stenosis is present. There is a  bioprosthetic valve present in the aortic position.   8. The inferior vena cava is normal in size with greater than 50%  respiratory variability, suggesting right atrial pressure of 3 mmHg.  Pulmonary systolic pressure is mildly increased at 38 mmHg but when compared to the prior study there is reduction from 48 mmHg.   EKG:  EKG no new data    Recent Labs: 08/22/2020: B Natriuretic Peptide 138.3; BUN 39; Creatinine, Ser 1.15; Hemoglobin 14.9; Platelets 172; Potassium 4.4; Sodium 137  Recent Lipid Panel    Component Value Date/Time   CHOL  09/11/2009 0555    124        ATP III CLASSIFICATION:  <200     mg/dL    Desirable  801-655  mg/dL   Borderline High  >=374    mg/dL   High          TRIG 827 (H) 09/11/2009 0555   HDL 14 (L) 09/11/2009 0555   CHOLHDL 8.9 09/11/2009 0555   VLDL 40 09/11/2009 0555   LDLCALC  09/11/2009 0555    70        Total Cholesterol/HDL:CHD Risk Coronary Heart Disease Risk Table                     Men   Women  1/2 Average Risk   3.4   3.3  Average Risk       5.0   4.4  2 X Average Risk   9.6   7.1  3 X Average Risk  23.4   11.0        Use the calculated Patient Ratio above and the CHD Risk Table to determine the patient's CHD Risk.        ATP III CLASSIFICATION (LDL):  <100     mg/dL   Optimal  078-675  mg/dL   Near or Above                    Optimal  130-159  mg/dL   Borderline  449-201  mg/dL   High  >007     mg/dL   Very High    Physical Exam:    VS:  BP 120/70 (BP Location: Left Arm, Patient Position: Sitting, Cuff Size: Normal)   Pulse 68   Ht 5\' 5"  (1.651 m)   Wt 156 lb (70.8 kg)   LMP  (LMP Unknown)   SpO2 98%   BMI 25.96 kg/m     Wt Readings from Last 3 Encounters:  01/23/21 156 lb (70.8 kg)  08/22/20 154 lb 12.8 oz (70.2 kg)  06/23/18 212 lb (96.2 kg)     GEN: No JVD. No acute distress HEENT: Normal NECK: No JVD. LYMPHATICS: No lymphadenopathy CARDIAC: 1/6 systolic no murmur. RRR n gallop, or edema.  VASCULAR:  normal Pulses. No bruits. RESPIRATORY:  Clear to auscultation without rales, wheezing or rhonchi  ABDOMEN: Soft, non-tender, non-distended, No pulsatile mass, MUSCULOSKELETAL: No deformity  SKIN: Warm and dry NEUROLOGIC:  Alert and oriented x 3 PSYCHIATRIC:  Normal affect   ASSESSMENT:    1. Chronic diastolic heart failure (HCC)   2. S/P partial sternotomy for aortic root replacement with stentless porcine aortic root graft    3. H/O aortic valve replacement   4. Morbid obesity (HCC)   5. Acute deep vein thrombosis (DVT) of other vein of lower extremity, unspecified laterality (HCC)   6. Epistaxis    PLAN:    In  order of problems listed above:  No evidence of volume overload Valve auscultation is normal no aortic regurgitation is heard Same Marked reduction in weight due to change in diet.  Encouraged increasing physical activity. Continue low-dose Eliquis.  Kidney function suggests a higher dose unless the trial requires low-dose Eliquis. If okay with all who are managing her, consider discontinuing aspirin to help with epistaxis.  1 year f/I.   Medication Adjustments/Labs and Tests Ordered: Current medicines are reviewed at length with the patient today.  Concerns regarding medicines are outlined above.  Orders Placed This Encounter  Procedures   Basic metabolic panel    No orders of the defined types were placed in this encounter.   Patient Instructions  Medication Instructions:  Your physician recommends that you continue on your current medications as directed. Please refer to the Current Medication list given to you today.  *If you need a refill on your cardiac medications before your next appointment, please call your pharmacy*   Lab Work: BMET today  If you have labs (blood work) drawn today and your tests are completely normal, you will receive your results only by: MyChart Message (if you have MyChart) OR A paper copy in the mail If you have any lab test that is abnormal or we need to change your treatment, we will call you to review the results.   Testing/Procedures: None   Follow-Up: At [facility], you and your health needs are our priority.  As part of our continuing mission to provide you with exceptional heart care, we have created designated Provider Care Teams.  These Care Teams include your primary Cardiologist (physician) and Advanced Practice Providers (APPs -  Physician Assistants and Nurse Practitioners) who all work together to provide you with the care you need, when you need it.  We recommend signing up for the patient portal called "MyChart".  Sign  up information is provided on this After Visit Summary.  MyChart is used to connect with patients for Virtual Visits (Telemedicine).  Patients are able to view lab/test results, encounter notes, upcoming appointments, etc.  Non-urgent messages can be sent to your provider as well.   To learn more about what you can do with MyChart, go to [email or web address].    Your next appointment:   1 year(s)  The format for your next appointment:   In Person  Provider:   You may see Lesleigh Noe, MD or one of the following Advanced Practice Providers on your designated Care Team:   Georgie Chard, NP   Other Instructions     Signed, Lesleigh Noe, MD  01/23/2021 10:10 AM    [facility]

## 2021-01-23 ENCOUNTER — Ambulatory Visit: Payer: Medicare PPO | Admitting: Interventional Cardiology

## 2021-01-23 ENCOUNTER — Other Ambulatory Visit: Payer: Self-pay

## 2021-01-23 ENCOUNTER — Encounter: Payer: Self-pay | Admitting: Interventional Cardiology

## 2021-01-23 VITALS — BP 120/70 | HR 68 | Ht 65.0 in | Wt 156.0 lb

## 2021-01-23 DIAGNOSIS — I82409 Acute embolism and thrombosis of unspecified deep veins of unspecified lower extremity: Secondary | ICD-10-CM

## 2021-01-23 DIAGNOSIS — I5032 Chronic diastolic (congestive) heart failure: Secondary | ICD-10-CM | POA: Diagnosis not present

## 2021-01-23 DIAGNOSIS — Z954 Presence of other heart-valve replacement: Secondary | ICD-10-CM | POA: Diagnosis not present

## 2021-01-23 DIAGNOSIS — R040 Epistaxis: Secondary | ICD-10-CM

## 2021-01-23 DIAGNOSIS — Z952 Presence of prosthetic heart valve: Secondary | ICD-10-CM

## 2021-01-23 LAB — BASIC METABOLIC PANEL WITH GFR
BUN/Creatinine Ratio: 29 — ABNORMAL HIGH (ref 12–28)
BUN: 39 mg/dL — ABNORMAL HIGH (ref 8–27)
CO2: 23 mmol/L (ref 20–29)
Calcium: 9.6 mg/dL (ref 8.7–10.3)
Chloride: 97 mmol/L (ref 96–106)
Creatinine, Ser: 1.33 mg/dL — ABNORMAL HIGH (ref 0.57–1.00)
Glucose: 84 mg/dL (ref 65–99)
Potassium: 4.8 mmol/L (ref 3.5–5.2)
Sodium: 137 mmol/L (ref 134–144)
eGFR: 44 mL/min/1.73 — ABNORMAL LOW (ref >59)

## 2021-01-23 NOTE — Patient Instructions (Signed)
Medication Instructions:  Your physician recommends that you continue on your current medications as directed. Please refer to the Current Medication list given to you today.  *If you need a refill on your cardiac medications before your next appointment, please call your pharmacy*   Lab Work: BMET today  If you have labs (blood work) drawn today and your tests are completely normal, you will receive your results only by: MyChart Message (if you have MyChart) OR A paper copy in the mail If you have any lab test that is abnormal or we need to change your treatment, we will call you to review the results.   Testing/Procedures: None   Follow-Up: At [facility], you and your health needs are our priority.  As part of our continuing mission to provide you with exceptional heart care, we have created designated Provider Care Teams.  These Care Teams include your primary Cardiologist (physician) and Advanced Practice Providers (APPs -  Physician Assistants and Nurse Practitioners) who all work together to provide you with the care you need, when you need it.  We recommend signing up for the patient portal called "MyChart".  Sign up information is provided on this After Visit Summary.  MyChart is used to connect with patients for Virtual Visits (Telemedicine).  Patients are able to view lab/test results, encounter notes, upcoming appointments, etc.  Non-urgent messages can be sent to your provider as well.   To learn more about what you can do with MyChart, go to [email or web address].    Your next appointment:   1 year(s)  The format for your next appointment:   In Person  Provider:   You may see Lesleigh Noe, MD or one of the following Advanced Practice Providers on your designated Care Team:   Georgie Chard, NP   Other Instructions

## 2021-01-24 ENCOUNTER — Other Ambulatory Visit (HOSPITAL_COMMUNITY): Payer: Self-pay

## 2021-01-25 ENCOUNTER — Other Ambulatory Visit (HOSPITAL_COMMUNITY): Payer: Self-pay

## 2021-01-25 ENCOUNTER — Other Ambulatory Visit: Payer: Self-pay | Admitting: *Deleted

## 2021-01-25 MED ORDER — LEVOTHYROXINE SODIUM 175 MCG PO TABS: 175.0000 ug | ORAL_TABLET | Freq: Every day | ORAL | 0 refills | Status: DC

## 2021-01-25 NOTE — Progress Notes (Signed)
Per Dr. Katrinka Blazing- he reached out to Dr. Gala Romney and they agreed ok for pt to stop ASA.  Spoke with pt and made her aware.

## 2021-02-20 ENCOUNTER — Other Ambulatory Visit (HOSPITAL_COMMUNITY): Payer: Self-pay | Admitting: Internal Medicine

## 2021-04-11 ENCOUNTER — Other Ambulatory Visit (HOSPITAL_COMMUNITY): Payer: Self-pay | Admitting: Internal Medicine

## 2021-04-22 ENCOUNTER — Other Ambulatory Visit (HOSPITAL_COMMUNITY): Payer: Self-pay | Admitting: Internal Medicine

## 2021-04-23 ENCOUNTER — Other Ambulatory Visit (HOSPITAL_COMMUNITY): Payer: Self-pay | Admitting: Internal Medicine

## 2021-04-25 ENCOUNTER — Other Ambulatory Visit (HOSPITAL_COMMUNITY): Payer: Self-pay | Admitting: Internal Medicine

## 2021-04-26 ENCOUNTER — Other Ambulatory Visit (HOSPITAL_COMMUNITY): Payer: Self-pay | Admitting: *Deleted

## 2021-04-26 MED ORDER — LEVOTHYROXINE SODIUM 175 MCG PO TABS: 175.0000 ug | ORAL_TABLET | Freq: Every day | ORAL | 0 refills | Status: DC

## 2021-07-14 ENCOUNTER — Other Ambulatory Visit (HOSPITAL_COMMUNITY): Payer: Self-pay | Admitting: Internal Medicine

## 2021-07-14 ENCOUNTER — Other Ambulatory Visit: Payer: Self-pay | Admitting: Interventional Cardiology

## 2021-07-17 ENCOUNTER — Other Ambulatory Visit (HOSPITAL_COMMUNITY): Payer: Self-pay | Admitting: *Deleted

## 2021-07-17 MED ORDER — LEVOTHYROXINE SODIUM 175 MCG PO TABS: 175.0000 ug | ORAL_TABLET | Freq: Every day | ORAL | 3 refills | Status: DC

## 2021-07-31 ENCOUNTER — Other Ambulatory Visit: Payer: Self-pay | Admitting: Interventional Cardiology

## 2021-10-07 ENCOUNTER — Other Ambulatory Visit: Payer: Self-pay | Admitting: Interventional Cardiology

## 2021-10-07 ENCOUNTER — Other Ambulatory Visit: Payer: Self-pay | Admitting: Internal Medicine

## 2021-10-14 ENCOUNTER — Other Ambulatory Visit (HOSPITAL_COMMUNITY): Payer: Self-pay | Admitting: Internal Medicine

## 2021-10-20 ENCOUNTER — Other Ambulatory Visit (HOSPITAL_COMMUNITY): Payer: Self-pay | Admitting: Internal Medicine

## 2021-11-16 ENCOUNTER — Other Ambulatory Visit (HOSPITAL_COMMUNITY): Payer: Self-pay | Admitting: Internal Medicine

## 2021-12-23 ENCOUNTER — Other Ambulatory Visit (HOSPITAL_COMMUNITY): Payer: Self-pay | Admitting: Internal Medicine

## 2021-12-31 ENCOUNTER — Other Ambulatory Visit (HOSPITAL_COMMUNITY): Payer: Self-pay | Admitting: Internal Medicine

## 2022-01-06 ENCOUNTER — Other Ambulatory Visit: Payer: Self-pay | Admitting: Interventional Cardiology

## 2022-01-06 ENCOUNTER — Other Ambulatory Visit (HOSPITAL_COMMUNITY): Payer: Self-pay | Admitting: Internal Medicine

## 2022-01-07 ENCOUNTER — Other Ambulatory Visit (HOSPITAL_COMMUNITY): Payer: Self-pay | Admitting: *Deleted

## 2022-01-07 MED ORDER — POTASSIUM CHLORIDE CRYS ER 20 MEQ PO TBCR: EXTENDED_RELEASE_TABLET | ORAL | 3 refills | Status: DC

## 2022-01-13 ENCOUNTER — Other Ambulatory Visit: Payer: Self-pay | Admitting: Interventional Cardiology

## 2022-01-29 ENCOUNTER — Other Ambulatory Visit: Payer: Self-pay | Admitting: Interventional Cardiology

## 2022-02-22 ENCOUNTER — Other Ambulatory Visit (HOSPITAL_COMMUNITY): Payer: Self-pay | Admitting: Internal Medicine

## 2022-02-25 ENCOUNTER — Other Ambulatory Visit: Payer: Self-pay | Admitting: Interventional Cardiology

## 2022-03-10 ENCOUNTER — Telehealth: Payer: Self-pay | Admitting: Interventional Cardiology

## 2022-03-10 NOTE — Telephone Encounter (Signed)
   Pt is requesting if Dr. Katrinka Blazing can refer her to a dental surgeon. She said she is having dental issue

## 2022-03-11 NOTE — Telephone Encounter (Signed)
Spoke with patient and advised her to talk with a dentist in regards to referral for an oral surgeon. Patient verbalized understanding.

## 2022-03-26 NOTE — Progress Notes (Signed)
Cardiology Office Note:    Date:  03/27/2022   ID:  Christine PattenJudith M Hicks, DOB 09/07/1955, MRN [id number]  PCP:  Oneita HurtPcp, No  Cardiologist:  Lesleigh NoeHenry W Anvay Tennis III, MD   Referring MD: No ref. provider found   Chief Complaint  Patient presents with   Cardiac Valve Problem   Follow-up    Anticoagulation for DVT/PE    History of Present Illness:    Christine Hicks is a 66 y.o. 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  Significant weight loss>50 lbs.   He has upcoming dental work.  It seems as though this is the main reason she is here.  The dentist wants to know if she can hold anticoagulation.  She denies shortness of breath, orthopnea, PND.  Relatively immobile.  Watching her weight and has lost significant weight.  Is followed in conjunction with the heart failure clinic.  She will see Christine Hicks in September.  Past Medical History:  Diagnosis Date   Acute on chronic diastolic congestive heart failure (HCC)    Anemia, iron deficiency    negative egd/colonoscopy 11/16/2015   Aortic stenosis, severe    Arthritis    Bicuspid aortic valve    DVT (deep venous thrombosis) (HCC)    RLE DVT 11/12/15   GERD (gastroesophageal reflux disease)    Heart murmur    Hypertension    Insomnia    Morbid obesity with BMI of 45.0-49.9, adult (HCC)    PONV (postoperative nausea and vomiting)    took a long time to wake up   S/P partial sternotomy for aortic root replacement with stentless porcine aortic root graft  01/01/2017   21 mm Medtronic Freestyle porcine aortic root graft with reimplantation of left main and right coronary arteries via partial upper sternotomy   Thyroid disease    Wears glasses    Wears partial dentures     Past Surgical History:  Procedure Laterality Date   ABDOMINAL SURGERY      Fistula formation, complication after hernia   AORTIC VALVE REPLACEMENT N/A 01/01/2017   Procedure: MINIMALLY INVASIVE AORTIC VALVE REPLACEMENT (AVR);  Surgeon: Purcell Nailswen, Christine H, MD;  [facility];  Service: Open Heart Surgery;  Laterality: N/A;   ASCENDING AORTIC ROOT REPLACEMENT N/A 01/01/2017   Procedure: ASCENDING AORTIC ROOT REPLACEMENT;  Surgeon: Purcell Nailswen, Christine H, MD;  [facility];  Service: Open Heart Surgery;  Laterality: N/A;   CARDIAC CATHETERIZATION     12/16/16   COLONOSCOPY N/A 11/16/2015   Procedure: COLONOSCOPY;  Surgeon: Christine Booparl E Gessner, MD;  [facility];  Service: Endoscopy;  Laterality: N/A;   ESOPHAGOGASTRODUODENOSCOPY N/A 11/16/2015   Procedure: ESOPHAGOGASTRODUODENOSCOPY (EGD);  Surgeon: Christine Booparl E Gessner, MD;  [facility];  Service: Endoscopy;  Laterality: N/A;   HERNIA REPAIR     MULTIPLE TOOTH EXTRACTIONS     RIGHT HEART CATH N/A 01/11/2018   Procedure: RIGHT HEART CATH;  Surgeon: Dolores PattyBensimhon, Christine R, MD;  Location: [facility] INVASIVE CV LAB;  Service: Cardiovascular;  Laterality: N/A;   Right leg cellulitis surgery     TEE WITHOUT CARDIOVERSION N/A 01/01/2017   Procedure: TRANSESOPHAGEAL ECHOCARDIOGRAM (TEE);  Surgeon: Purcell Nailswen, Christine H, MD;  [facility];  Service: Open Heart Surgery;  Laterality: N/A;   TEE WITHOUT CARDIOVERSION N/A 06/02/2018   Procedure: TRANSESOPHAGEAL ECHOCARDIOGRAM (TEE);  Surgeon:  Christine Bathe, MD;  Location: [facility] ENDOSCOPY;  Service: Cardiovascular;  Laterality: N/A;    Current Medications: Current Meds  Medication Sig   acetaminophen (TYLENOL) 500 MG tablet Take 500 mg by mouth every 6 (six) hours as needed for mild pain.   amoxicillin (AMOXIL) 500 MG tablet Take 4 tablets (2,000 mg total) by mouth as needed (1 hour before dental work).   ascorbic acid (VITAMIN C) 500 MG tablet Take 500 mg by mouth daily.   Cholecalciferol (VITAMIN D-3 PO) Take 500 mg by mouth daily.   ELIQUIS 2.5 MG TABS tablet TAKE 1 TABLET (2.5 MG TOTAL) BY  MOUTH 2 (TWO) TIMES DAILY. ABSOLUTE LAST REFILL WITHOUT OFFICE VISIT PLEASE CALL [phone] TO SCHEDULE   ferrous sulfate 325 (65 FE) MG tablet Take 1 tablet (325 mg total) by mouth 2 (two) times daily with a meal.   levothyroxine (SYNTHROID) 175 MCG tablet Take 1 tablet (175 mcg total) by mouth daily.   Magnesium 400 MG CAPS Take 400 mg by mouth daily.   Melatonin 3 MG TABS Take 3 mg by mouth at bedtime.   metolazone (ZAROXOLYN) 2.5 MG tablet Take 1 tablet (2.5 mg total) by mouth daily as needed. (For weight gain more than 255 pounds - fluid/weight gain)   pantoprazole (PROTONIX) 40 MG tablet TAKE 1 TABLET BY MOUTH DAILY. PLEASE KEEP UPCOMING APPT IN August 2023 WITH DR. Katrinka Hicks BEFORE ANYMORE REFILLS   potassium chloride SA (KLOR-CON M20) 20 MEQ tablet TAKE 2 TABLETS BY MOUTH DAILY   spironolactone (ALDACTONE) 25 MG tablet TAKE 1 TABLET BY MOUTH EVERY DAY   torsemide (DEMADEX) 20 MG tablet TAKE 3 TABS IN AM AND 1 TAB IN PM     Allergies:   No known allergies   Social History   Socioeconomic History   Marital status: Significant Other    Spouse name: Not on file   Number of children: Not on file   Years of education: Not on file   Highest education level: Not on file  Occupational History   Occupation: retired  Tobacco Use   Smoking status: Former    Types: Cigarettes   Smokeless tobacco: Never   Tobacco comments:    quit smoking cigarettes > 25 years ago  Vaping Use   Vaping Use: Never used  Substance and Sexual Activity   Alcohol use: No   Drug use: No   Sexual activity: Yes  Other Topics Concern   Not on file  Social History Narrative   Not on file   Social Determinants of Health   Financial Resource Strain: Not on file  Food Insecurity: Not on file  Transportation Needs: Not on file  Physical Activity: Not on file  Stress: Not on file  Social Connections: Not on file     Family History: The patient's family history includes Cardiomyopathy in her son; Emphysema  in her father; Heart attack in her mother; Lung cancer in her maternal aunt. Appetite is good.  Does some gardening.  Having terrible dental issues.  Upcoming dental surgery.  Cleared to proceed with endocarditis prophylaxis ((amoxicillin) and holding Eliquis if needed for 48 hours prior to the dental procedure. ROS:   Please see the history of present illness.    Denies chills and fever.  Appetite has been stable.  No volume overload.  All other systems reviewed and are negative.  EKGs/Labs/Other Studies Reviewed:    The following studies were reviewed today: 2D Doppler echocardiogram February 2022: IMPRESSIONS  1. Left ventricular ejection fraction, by estimation, is 60 to 65%. The  left ventricle has normal function. The left ventricle has no regional  wall motion abnormalities. Left ventricular diastolic parameters were  normal.   2. Right ventricular systolic function is normal. The right ventricular  size is normal. There is mildly elevated pulmonary artery systolic  pressure.   3. Left atrial size was moderately dilated.   4. Right atrial size was mildly dilated.   5. The mitral valve is normal in structure. Trivial mitral valve  regurgitation. No evidence of mitral stenosis.   6. Tricuspid valve regurgitation is moderate.   7. The aortic valve has been repaired/replaced. Aortic valve  regurgitation is not visualized. No aortic stenosis is present. There is a  bioprosthetic valve present in the aortic position.   8. The inferior vena cava is normal in size with greater than 50%  respiratory variability, suggesting right atrial pressure of 3 mmHg.    EKG:  EKG sinus bradycardia, left atrial and right atrial abnormality, right bundle branch block.  Compared to prior tracing from 2019, the rate is slower.  Recent Labs: No results found for requested labs within last 365 days.  Recent Lipid Panel    Component Value Date/Time   CHOL  09/11/2009 0555    124        ATP Hicks  CLASSIFICATION:  <200     mg/dL   Desirable  932-671  mg/dL   Borderline High  >=245    mg/dL   High          TRIG 809 (Hicks) 09/11/2009 0555   HDL 14 (L) 09/11/2009 0555   CHOLHDL 8.9 09/11/2009 0555   VLDL 40 09/11/2009 0555   LDLCALC  09/11/2009 0555    70        Total Cholesterol/HDL:CHD Risk Coronary Heart Disease Risk Table                     Men   Women  1/2 Average Risk   3.4   3.3  Average Risk       5.0   4.4  2 X Average Risk   9.6   7.1  3 X Average Risk  23.4   11.0        Use the calculated Patient Ratio above and the CHD Risk Table to determine the patient's CHD Risk.        ATP Hicks CLASSIFICATION (LDL):  <100     mg/dL   Optimal  983-382  mg/dL   Near or Above                    Optimal  130-159  mg/dL   Borderline  505-397  mg/dL   High  >673     mg/dL   Very High    Physical Exam:    VS:  BP 138/78   Pulse (!) 58   Ht 5' 3.5" (1.613 m)   Wt 149 lb (67.6 kg)   LMP  (LMP Unknown)   SpO2 99%   BMI 25.98 kg/m     Wt Readings from Last 3 Encounters:  03/27/22 149 lb (67.6 kg)  01/23/21 156 lb (70.8 kg)  08/22/20 154 lb 12.8 oz (70.2 kg)     GEN: Overweight. No acute distress HEENT: Normal NECK: No JVD. LYMPHATICS: No lymphadenopathy CARDIAC: No murmur. RRR no gallop, or edema. VASCULAR:  Normal Pulses. No bruits. RESPIRATORY:  Clear to auscultation  without rales, wheezing or rhonchi  ABDOMEN: Soft, non-tender, non-distended, No pulsatile mass, MUSCULOSKELETAL: No deformity  SKIN: Warm and dry NEUROLOGIC:  Alert and oriented x 3 PSYCHIATRIC:  Normal affect   ASSESSMENT:    1. Chronic diastolic heart failure (HCC)   2. S/P partial sternotomy for aortic root replacement with stentless porcine aortic root graft    3. Morbid obesity (HCC)   4. On continuous oral anticoagulation   5. PAH (pulmonary artery hypertension) (HCC)   6. Essential hypertension   7. Iron deficiency anemia due to chronic blood loss   8. Dental caries    PLAN:     In order of problems listed above:  No evidence of volume overload Auscultation of valve suggests no particular functional abnormality.  Needs endocarditis prophylaxis prior to dental surgery, amoxicillin. Improved weight. On continuous Eliquis therapy because of recurrent PE and DVT.  Okay to pause anticoagulation for 48 hours in preparation for dental work if needed.  Medication should be resumed as soon as possible thereafter. No clinical evidence of right heart failure or volume overload. Blood pressures well controlled Did not discuss we will get hemoglobin and CBC today to follow Eliquis management Endocarditis prophylaxis and okay to hold Eliquis for 48 hours prior to upcoming dental procedure.  Resume Eliquis as soon as possible after the procedure.   Needs 1 year follow-up with general cardiologist Blood pressure is well controlled.   Medication Adjustments/Labs and Tests Ordered: Current medicines are reviewed at length with the patient today.  Concerns regarding medicines are outlined above.  Orders Placed This Encounter  Procedures   CBC   Basic metabolic panel   EKG 12-Lead   No orders of the defined types were placed in this encounter.   Patient Instructions  Medication Instructions:  Your physician recommends that you continue on your current medications as directed. Please refer to the Current Medication list given to you today.  *If you need a refill on your cardiac medications before your next appointment, please call your pharmacy*   Lab Work: TODAY:  CBC  If you have labs (blood work) drawn today and your tests are completely normal, you will receive your results only by: MyChart Message (if you have MyChart) OR A paper copy in the mail If you have any lab test that is abnormal or we need to change your treatment, we will call you to review the results.   Testing/Procedures: None ordered    Follow-Up: At [facility], you and your  health needs are our priority.  As part of our continuing mission to provide you with exceptional heart care, we have created designated Provider Care Teams.  These Care Teams include your primary Cardiologist (physician) and Advanced Practice Providers (APPs -  Physician Assistants and Nurse Practitioners) who all work together to provide you with the care you need, when you need it.  We recommend signing up for the patient portal called "MyChart".  Sign up information is provided on this After Visit Summary.  MyChart is used to connect with patients for Virtual Visits (Telemedicine).  Patients are able to view lab/test results, encounter notes, upcoming appointments, etc.  Non-urgent messages can be sent to your provider as well.   To learn more about what you can do with MyChart, go to [email or web address].    Your next appointment:   12 month(s)  The format for your next appointment:   In Person  Provider:   DR. Shari Prows  (transitioning from Dr. Katrinka Hicks  Other Instructions   Important Information About Sugar         Signed, Lesleigh Noe, MD  03/27/2022 10:17 AM    [facility]

## 2022-03-27 ENCOUNTER — Ambulatory Visit: Payer: Medicare PPO | Attending: Interventional Cardiology | Admitting: Interventional Cardiology

## 2022-03-27 ENCOUNTER — Other Ambulatory Visit (HOSPITAL_COMMUNITY): Payer: Self-pay | Admitting: Internal Medicine

## 2022-03-27 ENCOUNTER — Encounter: Payer: Self-pay | Admitting: Interventional Cardiology

## 2022-03-27 VITALS — BP 138/78 | HR 58 | Ht 63.5 in | Wt 149.0 lb

## 2022-03-27 DIAGNOSIS — I10 Essential (primary) hypertension: Secondary | ICD-10-CM

## 2022-03-27 DIAGNOSIS — Z954 Presence of other heart-valve replacement: Secondary | ICD-10-CM | POA: Diagnosis not present

## 2022-03-27 DIAGNOSIS — I5032 Chronic diastolic (congestive) heart failure: Secondary | ICD-10-CM

## 2022-03-27 DIAGNOSIS — Z7901 Long term (current) use of anticoagulants: Secondary | ICD-10-CM

## 2022-03-27 DIAGNOSIS — K029 Dental caries, unspecified: Secondary | ICD-10-CM

## 2022-03-27 DIAGNOSIS — I2721 Secondary pulmonary arterial hypertension: Secondary | ICD-10-CM

## 2022-03-27 DIAGNOSIS — D500 Iron deficiency anemia secondary to blood loss (chronic): Secondary | ICD-10-CM

## 2022-03-27 NOTE — Patient Instructions (Signed)
Medication Instructions:  Your physician recommends that you continue on your current medications as directed. Please refer to the Current Medication list given to you today.  *If you need a refill on your cardiac medications before your next appointment, please call your pharmacy*   Lab Work: TODAY:  CBC  If you have labs (blood work) drawn today and your tests are completely normal, you will receive your results only by: MyChart Message (if you have MyChart) OR A paper copy in the mail If you have any lab test that is abnormal or we need to change your treatment, we will call you to review the results.   Testing/Procedures: None ordered    Follow-Up: At [facility], you and your health needs are our priority.  As part of our continuing mission to provide you with exceptional heart care, we have created designated Provider Care Teams.  These Care Teams include your primary Cardiologist (physician) and Advanced Practice Providers (APPs -  Physician Assistants and Nurse Practitioners) who all work together to provide you with the care you need, when you need it.  We recommend signing up for the patient portal called "MyChart".  Sign up information is provided on this After Visit Summary.  MyChart is used to connect with patients for Virtual Visits (Telemedicine).  Patients are able to view lab/test results, encounter notes, upcoming appointments, etc.  Non-urgent messages can be sent to your provider as well.   To learn more about what you can do with MyChart, go to [email or web address].    Your next appointment:   12 month(s)  The format for your next appointment:   In Person  Provider:   DR. Shari Prows  (transitioning from Dr. Katrinka Blazing    Other Instructions   Important Information About Sugar

## 2022-03-28 LAB — BASIC METABOLIC PANEL
BUN/Creatinine Ratio: 29 — ABNORMAL HIGH (ref 12–28)
BUN: 47 mg/dL — ABNORMAL HIGH (ref 8–27)
CO2: 26 mmol/L (ref 20–29)
Calcium: 10.1 mg/dL (ref 8.7–10.3)
Chloride: 95 mmol/L — ABNORMAL LOW (ref 96–106)
Creatinine, Ser: 1.63 mg/dL — ABNORMAL HIGH (ref 0.57–1.00)
Glucose: 97 mg/dL (ref 70–99)
Potassium: 5 mmol/L (ref 3.5–5.2)
Sodium: 134 mmol/L (ref 134–144)
eGFR: 34 mL/min/{1.73_m2} — ABNORMAL LOW (ref >59)

## 2022-03-28 LAB — CBC
Hematocrit: 40.8 % (ref 34.0–46.6)
Hemoglobin: 13.8 g/dL (ref 11.1–15.9)
MCH: 31.9 pg (ref 26.6–33.0)
MCHC: 33.8 g/dL (ref 31.5–35.7)
MCV: 94 fL (ref 79–97)
Platelets: 156 10*3/uL (ref 150–450)
RBC: 4.32 x10E6/uL (ref 3.77–5.28)
RDW: 12 % (ref 11.7–15.4)
WBC: 5 10*3/uL (ref 3.4–10.8)

## 2022-04-01 ENCOUNTER — Telehealth: Payer: Self-pay

## 2022-04-01 DIAGNOSIS — I10 Essential (primary) hypertension: Secondary | ICD-10-CM

## 2022-04-01 MED ORDER — TORSEMIDE 20 MG PO TABS: 60.0000 mg | ORAL_TABLET | Freq: Every day | ORAL | Status: AC

## 2022-04-01 MED ORDER — POTASSIUM CHLORIDE CRYS ER 20 MEQ PO TBCR: 20.0000 meq | EXTENDED_RELEASE_TABLET | Freq: Every day | ORAL | Status: DC

## 2022-04-01 NOTE — Telephone Encounter (Signed)
-----   Message from Lyn Records, MD sent at 03/28/2022  5:58 PM EDT ----- Let the patient know the labs are showing some kidney stress.Decrease the potassium to 1 tablet daily. Decrease demadex to 3 tabs each AM and omit PM dose. Will also inform Dr. Gala Romney in case he feels otherwise. BMET in 7-10 days. A copy will be sent to Patrcia Dolly, DO

## 2022-04-01 NOTE — Telephone Encounter (Signed)
Received callback from patient.  Discussed lab results per Dr. Katrinka Blazing: Let the patient know the labs are showing some kidney stress.Decrease the potassium to 1 tablet daily. Decrease demadex to 3 tabs each AM and omit PM dose. Will also inform Dr. Gala Romney in case he feels otherwise. BMET in 7-10 days.  Patient verbalized understanding of the above. Med list updated. BMET scheduled for 04/09/22.

## 2022-04-09 ENCOUNTER — Ambulatory Visit: Payer: Medicare PPO | Attending: Internal Medicine

## 2022-04-09 DIAGNOSIS — I10 Essential (primary) hypertension: Secondary | ICD-10-CM

## 2022-04-10 LAB — BASIC METABOLIC PANEL
BUN/Creatinine Ratio: 28 (ref 12–28)
BUN: 34 mg/dL — ABNORMAL HIGH (ref 8–27)
CO2: 25 mmol/L (ref 20–29)
Calcium: 9.6 mg/dL (ref 8.7–10.3)
Chloride: 97 mmol/L (ref 96–106)
Creatinine, Ser: 1.23 mg/dL — ABNORMAL HIGH (ref 0.57–1.00)
Glucose: 89 mg/dL (ref 70–99)
Potassium: 4.8 mmol/L (ref 3.5–5.2)
Sodium: 135 mmol/L (ref 134–144)
eGFR: 48 mL/min/{1.73_m2} — ABNORMAL LOW (ref >59)

## 2022-04-11 ENCOUNTER — Other Ambulatory Visit: Payer: Self-pay | Admitting: Interventional Cardiology

## 2022-04-16 ENCOUNTER — Other Ambulatory Visit (HOSPITAL_COMMUNITY): Payer: Self-pay | Admitting: Internal Medicine

## 2022-04-18 ENCOUNTER — Ambulatory Visit (HOSPITAL_COMMUNITY)
Admission: RE | Admit: 2022-04-18 | Discharge: 2022-04-18 | Disposition: A | Discharge status: Home / Self Care | Payer: Medicare PPO | Source: Ambulatory Visit | Attending: Internal Medicine | Admitting: Internal Medicine

## 2022-04-18 ENCOUNTER — Encounter (HOSPITAL_COMMUNITY): Payer: Self-pay | Admitting: Internal Medicine

## 2022-04-18 VITALS — BP 140/86 | HR 68 | Wt 152.6 lb

## 2022-04-18 DIAGNOSIS — E039 Hypothyroidism, unspecified: Secondary | ICD-10-CM | POA: Insufficient documentation

## 2022-04-18 DIAGNOSIS — R634 Abnormal weight loss: Secondary | ICD-10-CM | POA: Diagnosis not present

## 2022-04-18 DIAGNOSIS — I110 Hypertensive heart disease with heart failure: Secondary | ICD-10-CM | POA: Diagnosis present

## 2022-04-18 DIAGNOSIS — I5032 Chronic diastolic (congestive) heart failure: Secondary | ICD-10-CM | POA: Diagnosis not present

## 2022-04-18 DIAGNOSIS — Z952 Presence of prosthetic heart valve: Secondary | ICD-10-CM | POA: Diagnosis not present

## 2022-04-18 DIAGNOSIS — I10 Essential (primary) hypertension: Secondary | ICD-10-CM | POA: Diagnosis not present

## 2022-04-18 DIAGNOSIS — Z7901 Long term (current) use of anticoagulants: Secondary | ICD-10-CM | POA: Insufficient documentation

## 2022-04-18 DIAGNOSIS — G4733 Obstructive sleep apnea (adult) (pediatric): Secondary | ICD-10-CM | POA: Diagnosis not present

## 2022-04-18 DIAGNOSIS — Z6841 Body Mass Index (BMI) 40.0 and over, adult: Secondary | ICD-10-CM | POA: Diagnosis not present

## 2022-04-18 DIAGNOSIS — Z86711 Personal history of pulmonary embolism: Secondary | ICD-10-CM | POA: Insufficient documentation

## 2022-04-18 DIAGNOSIS — Z953 Presence of xenogenic heart valve: Secondary | ICD-10-CM | POA: Insufficient documentation

## 2022-04-18 DIAGNOSIS — I82501 Chronic embolism and thrombosis of unspecified deep veins of right lower extremity: Secondary | ICD-10-CM | POA: Diagnosis not present

## 2022-04-18 MED ORDER — AMOXICILLIN 500 MG PO TABS: 2000.0000 mg | ORAL_TABLET | ORAL | 3 refills | Status: AC | PRN

## 2022-04-18 NOTE — Progress Notes (Signed)
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    Admitted 6/19 with volume overload. Diuresed with IV lasix and transitioned to torsemide 60 mg twice a day. Diuresed 40 pounds. RHC was performed and showed preserved EF and severely dilated left atrium. Discharge weight 244 pounds.    Admitted 11/19 with fever. Found to have streptococcal agalactiae  bacteremia and started on IV ABX to finish 06/26/18. Following with ID. Thought to be secondary to left thigh cellulitis. TEE LVEF 60-65% no vegetation.    Here with her husband. Has lost over 100 pounds!! Breathing better. No edema, orthopnea or PND. 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 Hisband trying to get her to go to the gym with him.    01/01/2018 ECHO Left ventricle: The cavity size was normal. Systolic function was   vigorous. The estimated ejection fraction was in the range of 65%   to 70%. Wall motion was normal; there were no regional wall   motion abnormalities. There was no evidence of elevated   ventricular filling pressure by Doppler parameters. - Right atrium: The atrium was severely dilated. - Tricuspid valve: There was severe regurgitation. - Pulmonary arteries: Systolic pressure was mildly increased. PA   peak pressure: 39 mm Hg (S). RHC 12/2017  RA = 12 RV = 32/11 PA = 33/14 (23) PCW = 12 Fick  cardiac output/index = 6.37/2.9 PVR = 1.4 WU Ao sat = 98% PA sat = 71%, 73% SVC sat = 67%  Assessment:  Volume status much improved after marked diuresis. Has evidence of mildly elevated R-sided pressures with normal pulmonary pressures c/w RV dysfunction. Left-sided pressures normal.    Past Medical History:  Diagnosis Date   Acute on chronic diastolic congestive heart failure (HCC)    Anemia, iron deficiency    negative egd/colonoscopy 11/16/2015   Aortic stenosis, severe    Arthritis    Bicuspid aortic valve    DVT (deep venous thrombosis) (HCC)    RLE DVT 11/12/15   GERD (gastroesophageal reflux disease)    Heart murmur    Hypertension    Insomnia    Morbid obesity with BMI of 45.0-49.9, adult (HCC)    PONV (postoperative nausea and vomiting)    took a long time to wake up   S/P partial sternotomy for aortic root replacement with stentless porcine aortic root graft  01/01/2017   21 mm Medtronic Freestyle porcine aortic root graft with reimplantation of left main and right coronary arteries via partial upper sternotomy   Thyroid disease    Wears glasses    Wears partial dentures    Past Surgical History:  Procedure Laterality Date   ABDOMINAL SURGERY     Fistula formation, complication after hernia   AORTIC VALVE REPLACEMENT N/A 01/01/2017   Procedure: MINIMALLY INVASIVE AORTIC VALVE REPLACEMENT (AVR);  Surgeon: Purcell Nails, MD;  Location: [facility] OR;  Service: Open Heart Surgery;  Laterality: N/A;   ASCENDING AORTIC ROOT REPLACEMENT N/A 01/01/2017   Procedure: ASCENDING AORTIC ROOT REPLACEMENT;  Surgeon: Purcell Nails, MD;  Location: [facility] OR;  Service: Open Heart Surgery;  Laterality: N/A;   CARDIAC CATHETERIZATION     12/16/16   COLONOSCOPY N/A 11/16/2015   Procedure: COLONOSCOPY;  Surgeon: Iva Boop, MD;  Location: [facility] ENDOSCOPY;  Service: Endoscopy;  Laterality: N/A;   ESOPHAGOGASTRODUODENOSCOPY N/A 11/16/2015   Procedure: ESOPHAGOGASTRODUODENOSCOPY (EGD);  Surgeon: Iva Boop, MD;  Location: [facility] ENDOSCOPY;  Service: Endoscopy;  Laterality: N/A;   HERNIA REPAIR     MULTIPLE TOOTH EXTRACTIONS     RIGHT HEART CATH N/A 01/11/2018   Procedure: RIGHT HEART CATH;  Surgeon: Dolores Patty, MD;  Location: [facility] INVASIVE CV LAB;  Service: Cardiovascular;  Laterality: N/A;   Right leg cellulitis surgery     TEE WITHOUT CARDIOVERSION N/A 01/01/2017   Procedure: TRANSESOPHAGEAL ECHOCARDIOGRAM (TEE);  Surgeon: Purcell Nails, MD;  Location[facility] OR;  Service: Open Heart Surgery;  Laterality: N/A;   TEE WITHOUT CARDIOVERSION N/A 06/02/2018   Procedure: TRANSESOPHAGEAL ECHOCARDIOGRAM (TEE);  Surgeon: Jake Bathe, MD;  Location: [facility] ENDOSCOPY;  Service: Cardiovascular;  Laterality: N/A;     Current Outpatient Medications  Medication Sig Dispense Refill   acetaminophen (TYLENOL) 500 MG tablet Take 500 mg by mouth every 6 (six) hours as needed for mild pain.     amoxicillin (AMOXIL) 500 MG tablet Take 4 tablets (2,000 mg total) by mouth as needed (1 hour before dental work). 4 tablet 3   apixaban (ELIQUIS) 2.5 MG TABS tablet Take 1 tablet (2.5 mg total) by mouth 2 (two) times daily. NEEDS TO KEEP FOLLOW UP APPOINTMENT FOR ANYMORE REFILLS 60 tablet 2   ascorbic acid (VITAMIN C) 500 MG tablet Take 500 mg by mouth daily.     Cholecalciferol (VITAMIN D-3 PO) Take 500 mg by mouth daily.     ferrous sulfate 325 (65 FE) MG tablet Take 1 tablet (325 mg total) by mouth 2 (two) times daily with a meal. 60 tablet 3   levothyroxine (SYNTHROID) 175 MCG tablet Take 1 tablet (175 mcg total) by mouth daily. 90 tablet 3   Magnesium 400 MG CAPS Take 400 mg by mouth daily. 30 capsule 3   Melatonin 3 MG TABS Take 3 mg by mouth at bedtime.     pantoprazole (PROTONIX) 40 MG tablet TAKE 1 TABLET BY MOUTH DAILY. 90 tablet 3   potassium chloride SA (KLOR-CON M) 20 MEQ tablet Take 1 tablet (20 mEq total) by mouth daily.     spironolactone (ALDACTONE) 25 MG tablet TAKE 1 TABLET BY MOUTH EVERY DAY  90 tablet 3   torsemide (DEMADEX) 20 MG tablet Take 3 tablets (60 mg total) by mouth daily.     No current facility-administered medications for this encounter.    Allergies:   No known allergies   Social History:  The patient  reports that she has quit smoking. Her smoking use included cigarettes. She has never used smokeless tobacco. She reports that she does not drink alcohol and does not use drugs.   Family History:  The patient's family history includes Cardiomyopathy in her son; Emphysema in her father; Heart attack in her mother; Lung cancer in her maternal aunt.   ROS:  Please see the history of present illness.   All other systems are personally reviewed and negative.   Vitals:  04/18/22 1036  BP: (!) 140/86  Pulse: 68  SpO2: 97%  Weight: 69.2 kg (152 lb 9.6 oz)    Exam:   General:  Well appearing. No resp difficulty HEENT: normal Neck: supple. no JVD. Carotids 2+ bilat; no bruits. No lymphadenopathy or thryomegaly appreciated. Cor: PMI nondisplaced. Regular rate & rhythm. No rubs, gallops or murmurs. Lungs: clear Abdomen: soft, nontender, nondistended. No hepatosplenomegaly. No bruits or masses. Good bowel sounds. Extremities: no cyanosis, clubbing, rash, edema Neuro: alert & orientedx3, cranial nerves grossly intact. moves all 4 extremities w/o difficulty. Affect pleasant  Recent Labs: 03/27/2022: Hemoglobin 13.8; Platelets 156 04/09/2022: BUN 34; Creatinine, Ser 1.23; Potassium 4.8; Sodium 135  Personally reviewed   Wt Readings from Last 3 Encounters:  04/18/22 69.2 kg (152 lb 9.6 oz)  03/27/22 67.6 kg (149 lb)  01/23/21 70.8 kg (156 lb)     ASSESSMENT AND PLAN:  1. Chronic Diastolic Heart Failure. 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 - RHC numbers. Minimal PAH - Doing much better after 100 pound weight loss. NYHA II - Torsemide cut back to 60 mg daily - Echo 2/22 EF 16-10% diastolic parameters normal.  - We discussed addition of  SGLT2i but she does not want to proceed at this time with cost - Encouraged her to go to the gym with her husband,   2. Suspected OSA - Sleep study completed 12/29/2017. Negative (AHI 1)   3. Morbid Obesity - Resolved - Much improved with 100 pound weight loss. Congratulated her on her efforts - Body mass index is 26.61 kg/m.   4. H/O DVT/PE - Continue eliquis 2.5 mg BID per Amplify EXT trial  - Denies bleeding.    5. H/O of Congenital Bicuspid Valve w/ Severe Stenosis:  - s/p bioprosthetic aortic valve replacement with aortic root placement, per Dr. Roxy Manns June 2018.  - No change to current plan.   - TEE 06/02/18 with stable valve and no evidence of endocarditis.  - Aware of SBE prophylaxis  Refill amoxicillin  - Follows with Dr. Tamala Julian  Doing well. Can graduate HF Clinic.    Signed, Glori Bickers, MD  04/18/2022 10:45 AM  Advanced [facility] Failure [facility] [place] [address] [phone] (office) [phone] (fax)

## 2022-04-18 NOTE — Patient Instructions (Signed)
There has been no changes to your medications.  Follow up with Dr.Smith.  Your physician recommends that you schedule a follow-up appointment in: as needed   If you have any questions or concerns before your next appointment please send [place] a message through [place] or call our office at [phone].    TO LEAVE A MESSAGE FOR THE NURSE SELECT OPTION 2, PLEASE LEAVE A MESSAGE INCLUDING: YOUR NAME DATE OF BIRTH CALL BACK NUMBER REASON FOR CALL**this is important as we prioritize the call backs  YOU WILL RECEIVE A CALL BACK THE SAME DAY AS LONG AS YOU CALL BEFORE 4:00 PM  At the [facility], you and your health needs are our priority. As part of our continuing mission to provide you with exceptional heart care, we have created designated Provider Care Teams. These Care Teams include your primary Cardiologist (physician) and Advanced Practice Providers (APPs- Physician Assistants and Nurse Practitioners) who all work together to provide you with the care you need, when you need it.   You may see any of the following providers on your designated Care Team at your next follow up: Dr Glori Bickers Dr Loralie Champagne Dr. Roxana Hires, NP Lyda Jester, [place] [facility] [place], [place] Forestine Na, NP Audry Riles, PharmD   Please be sure to bring in all your medications bottles to every appointment.

## 2022-05-12 ENCOUNTER — Telehealth: Payer: Self-pay | Admitting: *Deleted

## 2022-05-12 NOTE — Telephone Encounter (Signed)
Office received a fax for clearance, though we will need further clarification. Looks like we just need to know how many teeth are being extracted.       Pre-operative Risk Assessment    Patient Name: Christine Hicks  DOB: [birth year] MRN: [id number]     Request for Surgical Clearance    Procedure:  Dental Extraction - Amount of Teeth to be Pulled:  TRIED TO REACH THE DDS OFFICE, THOUGH NO ANSWER ; WILL BE SURGICAL EXTRACTIONS PER CLEARANCE REQUEST  Date of Surgery:  Clearance TBD                                 Surgeon:   Surgeon's Group or Practice Name:  [facility] Phone number:  [phone] Fax number:  [phone]   Type of Clearance Requested:   - Medical  - Pharmacy:  Hold Apixaban (Eliquis)     Type of Anesthesia:  Local    Additional requests/questions:    Jiles Prows   05/12/2022, 1:15 PM

## 2022-05-13 NOTE — Telephone Encounter (Signed)
I was able to s/w the dds office and confirm procedure is for 1 tooth only to be extracted

## 2022-05-13 NOTE — Telephone Encounter (Signed)
   Patient Name: Christine Hicks  DOB: 1955-09-20 MRN: [id number]  Primary Cardiologist: Sinclair Grooms, MD  Chart reviewed as part of pre-operative protocol coverage.    Simple dental extractions (i.e. 1-2 teeth) are considered low risk procedures per guidelines and generally do not require any specific cardiac clearance. It is also generally accepted that for simple extractions and dental cleanings, there is no need to interrupt blood thinner therapy.   SBE prophylaxis is required for the patient from a cardiac standpoint.  I will route this recommendation to the requesting party via Epic fax function and remove from pre-op pool.  Please call with questions.  Lenna Sciara, NP 05/13/2022, 4:50 PM

## 2022-06-27 ENCOUNTER — Other Ambulatory Visit (HOSPITAL_COMMUNITY): Payer: Self-pay | Admitting: Internal Medicine

## 2022-07-12 ENCOUNTER — Other Ambulatory Visit (HOSPITAL_COMMUNITY): Payer: Self-pay | Admitting: Internal Medicine

## 2022-08-09 ENCOUNTER — Other Ambulatory Visit (HOSPITAL_COMMUNITY): Payer: Self-pay | Admitting: Internal Medicine

## 2022-10-10 ENCOUNTER — Other Ambulatory Visit (HOSPITAL_COMMUNITY): Payer: Self-pay | Admitting: Internal Medicine

## 2022-11-22 ENCOUNTER — Other Ambulatory Visit (HOSPITAL_COMMUNITY): Payer: Self-pay | Admitting: Internal Medicine

## 2022-12-01 ENCOUNTER — Other Ambulatory Visit: Payer: Self-pay | Admitting: Nurse Practitioner

## 2022-12-01 DIAGNOSIS — E2839 Other primary ovarian failure: Secondary | ICD-10-CM

## 2023-02-04 ENCOUNTER — Other Ambulatory Visit: Payer: Self-pay | Admitting: *Deleted

## 2023-02-04 DIAGNOSIS — I8393 Asymptomatic varicose veins of bilateral lower extremities: Secondary | ICD-10-CM

## 2023-02-24 ENCOUNTER — Encounter (HOSPITAL_COMMUNITY): Payer: Medicare PPO

## 2023-02-24 ENCOUNTER — Encounter: Payer: Medicare PPO | Admitting: Vascular Surgery

## 2023-05-12 NOTE — Progress Notes (Addendum)
Cardiology Office Note:  .   Date:  05/14/2023  ID:  Christine Hicks, DOB 27-Nov-1955, MRN [id number] PCP: Pcp, No  [facility] Providers Cardiologist:  Dr.Skains  1}    History of Present Illness: .   Christine Hicks is a 67 y.o. female 67 year old female with history of morbid obesity, status post 5 pound weight loss, congenital bicuspid aortic valve status post porcine aortic valve repair plus aortic root replacement in 2018, hypertension, hypothyroidism, OSA, DVT/PE, on chronic Eliquis, and chronic diastolic heart failure.  Last seen by Dr. Gala Romney  on 04/18/2022 for chronic diastolic heart failure.  She remains on GDMT with decrease in torsemide to 60 mg daily, discussion for addition of Jardiance but she did not want to proceed with that at that time due to cost.   She comes today without any cardiac complaints.  She is concerned about cost of Eliquis and is asking for samples as it is costing her $300 a month now in the donut hole.  ROS: Denies chest pain shortness of breath dizziness palpitations or fatigue.  No issues with bleeding on Eliquis.  Studies Reviewed: Marland Kitchen   EKG Interpretation Date/Time:  Thursday May 14 2023 11:07:25 EDT Ventricular Rate:  84 PR Interval:  204 QRS Duration:  132 QT Interval:  422 QTC Calculation: 498 R Axis:   90  Text Interpretation: Normal sinus rhythm Left atrial enlargement Right bundle branch block Septal infarct , age undetermined When compared with ECG of 29-May-2018 02:24, PREVIOUS ECG IS PRESENT Confirmed by Joni Reining [phone]) on 05/14/2023 11:28:07 AM   Echocardiogram 09/04/2020   1. Left ventricular ejection fraction, by estimation, is 60 to 65%. The  left ventricle has normal function. The left ventricle has no regional  wall motion abnormalities. Left ventricular diastolic parameters were  normal.   2. Right ventricular systolic function is normal. The right ventricular  size is normal. There is mildly elevated  pulmonary artery systolic  pressure.   3. Left atrial size was moderately dilated.   4. Right atrial size was mildly dilated.   5. The mitral valve is normal in structure. Trivial mitral valve  regurgitation. No evidence of mitral stenosis.   6. Tricuspid valve regurgitation is moderate.   7. The aortic valve has been repaired/replaced. Aortic valve  regurgitation is not visualized. No aortic stenosis is present. There is a  bioprosthetic valve present in the aortic position.   8. The inferior vena cava is normal in size with greater than 50%  respiratory variability, suggesting right atrial pressure of 3 mmHg.    EKG Interpretation Date/Time:  Thursday May 14 2023 11:07:25 EDT Ventricular Rate:  84 PR Interval:  204 QRS Duration:  132 QT Interval:  422 QTC Calculation: 498 R Axis:   90  Text Interpretation: Normal sinus rhythm Left atrial enlargement Right bundle branch block Septal infarct , age undetermined When compared with ECG of 29-May-2018 02:24, PREVIOUS ECG IS PRESENT Confirmed by Joni Reining [phone]) on 05/14/2023 11:28:07 AM      Physical Exam:   VS:  BP 132/66 (BP Location: Left Arm, Patient Position: Sitting, Cuff Size: Normal)   Pulse 69   Ht 5\' 3"  (1.6 m)   Wt 162 lb 3.2 oz (73.6 kg)   LMP  (LMP Unknown)   SpO2 96%   BMI 28.73 kg/m    Wt Readings from Last 3 Encounters:  05/14/23 162 lb 3.2 oz (73.6 kg)  04/18/22 152 lb 9.6 oz (69.2  kg)  03/27/22 149 lb (67.6 kg)    GEN: Well nourished, well developed in no acute distress NECK: No JVD; No carotid bruits CARDIAC: RRR, 2/6 holosystolic murmur, rubs, gallops RESPIRATORY:  Clear to auscultation without rales, wheezing or rhonchi  ABDOMEN: Soft, non-tender, non-distended EXTREMITIES:  No edema; No deformity primary denies  ASSESSMENT AND PLAN: .    DVT/PE: Concerned about issues with cost and Eliquis prescription.  Samples of Eliquis 2.5 mg are given to her along with information on patient  assistance.  No complaints of leg pain or bleeding issues on Eliquis.   2.  Hypertension: Blood pressure is well-controlled currently.  Would not make any changes on her medication regimen at this time.  3.  Chronic diastolic heart failure: The patient has no evidence of volume overload today.  She has gained a little bit of weight but she states that her Synthroid has been adjusted.  She is not always compliant with low-sodium diet.  I reminded her that if she gains 3 to 5 pounds in 2 to 3 days she is to take additional dose of torsemide.  She continues to refuse Jardiance at this time.  Signed, Bettey Mare. Liborio Nixon, ANP, AACC

## 2023-05-14 ENCOUNTER — Ambulatory Visit: Payer: Medicare HMO | Attending: Cardiology | Admitting: Adult Health

## 2023-05-14 ENCOUNTER — Encounter: Payer: Self-pay | Admitting: Adult Health

## 2023-05-14 VITALS — BP 132/66 | HR 69 | Ht 63.0 in | Wt 162.2 lb

## 2023-05-14 DIAGNOSIS — I10 Essential (primary) hypertension: Secondary | ICD-10-CM | POA: Diagnosis not present

## 2023-05-14 DIAGNOSIS — I82409 Acute embolism and thrombosis of unspecified deep veins of unspecified lower extremity: Secondary | ICD-10-CM

## 2023-05-14 DIAGNOSIS — I2721 Secondary pulmonary arterial hypertension: Secondary | ICD-10-CM | POA: Diagnosis not present

## 2023-05-14 MED ORDER — APIXABAN 2.5 MG PO TABS: 2.5000 mg | ORAL_TABLET | Freq: Two times a day (BID) | ORAL | Status: DC

## 2023-05-14 NOTE — Progress Notes (Deleted)
Error/ Duplicate

## 2023-05-14 NOTE — Patient Instructions (Signed)
Medication Instructions:  No Changes *If you need a refill on your cardiac medications before your next appointment, please call your pharmacy*   Lab Work: No Labs If you have labs (blood work) drawn today and your tests are completely normal, you will receive your results only by: MyChart Message (if you have MyChart) OR A paper copy in the mail If you have any lab test that is abnormal or we need to change your treatment, we will call you to review the results.   Testing/Procedures: No Testing   Follow-Up: At [facility], you and your health needs are our priority.  As part of our continuing mission to provide you with exceptional heart care, we have created designated Provider Care Teams.  These Care Teams include your primary Cardiologist (physician) and Advanced Practice Providers (APPs -  Physician Assistants and Nurse Practitioners) who all work together to provide you with the care you need, when you need it.  We recommend signing up for the patient portal called "MyChart".  Sign up information is provided on this After Visit Summary.  MyChart is used to connect with patients for Virtual Visits (Telemedicine).  Patients are able to view lab/test results, encounter notes, upcoming appointments, etc.  Non-urgent messages can be sent to your provider as well.   To learn more about what you can do with MyChart, go to [email or web address].    Your next appointment:   3 month(s)  Provider:   Donato Schultz, MD

## 2023-06-01 ENCOUNTER — Ambulatory Visit: Payer: Medicare PPO | Admitting: Cardiology

## 2023-06-12 ENCOUNTER — Other Ambulatory Visit (HOSPITAL_COMMUNITY): Payer: Self-pay | Admitting: Internal Medicine

## 2023-07-30 ENCOUNTER — Telehealth: Payer: Self-pay | Admitting: Cardiology

## 2023-07-30 ENCOUNTER — Other Ambulatory Visit (HOSPITAL_COMMUNITY): Payer: Self-pay

## 2023-07-30 MED ORDER — APIXABAN 2.5 MG PO TABS: 2.5000 mg | ORAL_TABLET | Freq: Two times a day (BID) | ORAL | Status: AC

## 2023-07-30 NOTE — Telephone Encounter (Signed)
 Pt c/o medication issue:  1. Name of Medication:   ELIQUIS  2.5 MG TABS tablet    2. How are you currently taking this medication (dosage and times per day)? As written  3. Are you having a reaction (difficulty breathing--STAT)? No   4. What is your medication issue? Pt states she cannot afford this medication on her new ins plan. Also wants to know if we have any samples until this gets figured out.

## 2023-07-30 NOTE — Telephone Encounter (Signed)
 Spoke with patient and let her know I had talked with one of our social workers that suggested she International Aid/development Worker of [place] at [phone] ext [phone] and ask them to help her complete the extra help/low income subsidy application.  I did let her know that they may ask her questions regarding her income and assets.  She took her last pill this morning. As she is working on assistance we will put 2 weeks of Eliquis  2.5 mg samples up front for her and she will have someone pick up today.  She has an appointment with Dr. Jeffrie on 1/13 and will discuss other options with Dr. Jeffrie at that time.  She was appreciative of my call with information given.

## 2023-07-30 NOTE — Addendum Note (Signed)
 Addended by: Adriana Simas, Mayuri Staples L on: 07/30/2023 04:54 PM   Modules accepted: Orders

## 2023-07-30 NOTE — Telephone Encounter (Signed)
 I spoke with patient.  Her insurance changed to [employer].  Eliquis will cost her $255 for the first month and then will be $47 per month.  She cannot afford the first month of $255.   Will check with assistance team

## 2023-07-30 NOTE — Telephone Encounter (Signed)
 I am putting two weeks of Eliquis 2.5mg  samples at front desk per Dennis Bast.

## 2023-08-10 ENCOUNTER — Other Ambulatory Visit: Payer: Self-pay | Admitting: *Deleted

## 2023-08-10 ENCOUNTER — Encounter: Payer: Self-pay | Admitting: Cardiology

## 2023-08-10 ENCOUNTER — Ambulatory Visit: Payer: Medicare Other | Attending: Cardiology | Admitting: Cardiology

## 2023-08-10 VITALS — BP 140/68 | HR 62 | Ht 64.5 in | Wt 164.0 lb

## 2023-08-10 DIAGNOSIS — Z952 Presence of prosthetic heart valve: Secondary | ICD-10-CM

## 2023-08-10 DIAGNOSIS — I5032 Chronic diastolic (congestive) heart failure: Secondary | ICD-10-CM | POA: Diagnosis not present

## 2023-08-10 MED ORDER — APIXABAN 2.5 MG PO TABS: 2.5000 mg | ORAL_TABLET | Freq: Two times a day (BID) | ORAL | Status: DC

## 2023-08-10 MED ORDER — APIXABAN 2.5 MG PO TABS: 2.5000 mg | ORAL_TABLET | Freq: Two times a day (BID) | ORAL | 0 refills | Status: DC

## 2023-08-10 NOTE — Patient Instructions (Signed)
 Medication Instructions:  The current medical regimen is effective;  continue present plan and medications.  *If you need a refill on your cardiac medications before your next appointment, please call your pharmacy*  Follow-Up: At [facility], you and your health needs are our priority.  As part of our continuing mission to provide you with exceptional heart care, we have created designated Provider Care Teams.  These Care Teams include your primary Cardiologist (physician) and Advanced Practice Providers (APPs -  Physician Assistants and Nurse Practitioners) who all work together to provide you with the care you need, when you need it.  We recommend signing up for the patient portal called "MyChart".  Sign up information is provided on this After Visit Summary.  MyChart is used to connect with patients for Virtual Visits (Telemedicine).  Patients are able to view lab/test results, encounter notes, upcoming appointments, etc.  Non-urgent messages can be sent to your provider as well.   To learn more about what you can do with MyChart, go to [email or web address].    Your next appointment:   1 year(s)  Provider:   Donato Schultz, MD

## 2023-08-10 NOTE — Progress Notes (Signed)
 Cardiology Office Note:  .   Date:  08/10/2023  ID:  Christine Hicks, DOB 03-18-1956, MRN [id number] PCP: Pcp, No  [facility] Providers Cardiologist:  Oneil Parchment, MD     History of Present Illness: .   Christine Hicks is a 68 y.o. 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 The patient's last echocardiogram in 2022 showed an EF of 60-65% with normal diastolic parameters and a mildly dilated left atrial site. The bioprosthetic valve was stable at that time.  The patient expressed significant concern about the cost of Eliquis , which has been causing stress. She reported that her income exceeds the limit for patient assistance programs by a few hundred dollars, making her ineligible for financial aid.  The patient's history of DVT/PE necessitates chronic anticoagulation. The DVT occurred over ten years ago, and the patient was not on any anticoagulant other than aspirin  until after her heart surgery in 2018. The patient was started on Eliquis  post-surgery, which was affordable at the time due to a program that reduced the cost to $10. However, subsequent changes in insurance have significantly increased the cost of Eliquis , making it unaffordable for the patient. The patient is currently taking aspirin  but understands that it is not sufficient for preventing new clot formation.          ROS: No CP, no SOB  Studies Reviewed: .        Results   DIAGNOSTIC Echocardiogram: EF 60-65%, normal diastolic parameters, mildly dilated left atrium, stable bioprosthetic valve (09/04/2020) EKG: Normal     Risk Assessment/Calculations:           Physical  Exam:   VS:  BP (!) 140/68 (BP Location: Right Arm)   Pulse 62   Ht 5' 4.5 (1.638 m)   Wt 164 lb (74.4 kg)   LMP  (LMP Unknown)   SpO2 100%   BMI 27.72 kg/m    Wt Readings from Last 3 Encounters:  08/10/23 164 lb (74.4 kg)  05/14/23 162 lb 3.2 oz (73.6 kg)  04/18/22 152 lb 9.6 oz (69.2 kg)    GEN: Well nourished, well developed in no acute distress NECK: No JVD; No carotid bruits CARDIAC: RRR, no murmurs, no rubs, no gallops RESPIRATORY:  Clear to auscultation without rales, wheezing or rhonchi  ABDOMEN: Soft, non-tender, non-distended EXTREMITIES:  No edema; No deformity   ASSESSMENT AND PLAN: .    Assessment and Plan    Chronic Diastolic Heart Failure Chronic diastolic heart failure, well-managed. Echocardiogram from September 04, 2020, showed an ejection fraction of 60-65% with normal diastolic parameters and a mildly dilated left atrium.  -Continue torsemide  20 mg, Spironolactone  25 QD    Bioprosthetic valve is stable. - Continue current management. ECHO 2022 stable. 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 Chronic maintain tx started by Dr. Bensimhon.  Experiencing financial difficulties affording Eliquis  due to insurance changes. Discussed alternative anticoagulation options including Coumadin, which requires frequent INR monitoring and dietary restrictions. Risks of Coumadin include high variability in INR levels and dietary restrictions impacting efficacy. - Refer to child psychotherapist for assistance with Eliquis  costs -  Consider transition to Coumadin if financial assistance is not available - Coordinate with primary care physician for Coumadin management if needed  Hypertension Hypertension, well-managed. - Continue current management - Monitor blood pressure regularly  Hypothyroidism Hypothyroidism, well-managed. - Continue current management - Monitor thyroid  function  tests as per primary care physician  Obstructive Sleep Apnea Obstructive sleep apnea, well-managed. - Continue current management - Follow up with sleep specialist as needed  General Health Maintenance Emphasis on the importance of regular follow-ups and monitoring of chronic conditions. - Annual follow-up with cardiology - Regular follow-up with primary care physician  Follow-up - Schedule annual follow-up with cardiology - Coordinate with primary care physician for regular monitoring and management of chronic conditions.               Signed, Oneil Parchment, MD

## 2023-08-10 NOTE — Addendum Note (Signed)
 Addended by: Sharin Grave on: 08/10/2023 12:10 PM   Modules accepted: Orders

## 2023-08-10 NOTE — Addendum Note (Signed)
 Addended by: Sharin Grave on: 08/10/2023 01:18 PM   Modules accepted: Orders

## 2023-08-11 ENCOUNTER — Telehealth: Payer: Self-pay

## 2023-09-04 MED ORDER — APIXABAN 2.5 MG PO TABS: 2.5000 mg | ORAL_TABLET | Freq: Two times a day (BID) | ORAL | Status: AC

## 2023-09-04 NOTE — Addendum Note (Signed)
 Addended by: THEOTIS SHARLET PARAS on: 09/04/2023 05:05 PM   Modules accepted: Orders
